# Patient Record
Sex: Female | Born: 1955
Health system: Southern US, Community
[De-identification: ages and names within clinical notes are randomized; demographics above are authoritative.]

## PROBLEM LIST (undated history)

## (undated) ENCOUNTER — Emergency Department (HOSPITAL_COMMUNITY): Discharge status: Absent without leave | Payer: No Typology Code available for payment source

## (undated) DIAGNOSIS — F329 Major depressive disorder, single episode, unspecified: Secondary | ICD-10-CM

## (undated) DIAGNOSIS — R569 Unspecified convulsions: Secondary | ICD-10-CM

## (undated) DIAGNOSIS — I8500 Esophageal varices without bleeding: Secondary | ICD-10-CM

## (undated) DIAGNOSIS — I471 Supraventricular tachycardia: Secondary | ICD-10-CM

## (undated) DIAGNOSIS — J302 Other seasonal allergic rhinitis: Secondary | ICD-10-CM

## (undated) DIAGNOSIS — I2510 Atherosclerotic heart disease of native coronary artery without angina pectoris: Secondary | ICD-10-CM

## (undated) DIAGNOSIS — K766 Portal hypertension: Secondary | ICD-10-CM

## (undated) DIAGNOSIS — E1165 Type 2 diabetes mellitus with hyperglycemia: Secondary | ICD-10-CM

## (undated) DIAGNOSIS — M2041 Other hammer toe(s) (acquired), right foot: Secondary | ICD-10-CM

## (undated) DIAGNOSIS — K219 Gastro-esophageal reflux disease without esophagitis: Secondary | ICD-10-CM

## (undated) DIAGNOSIS — E119 Type 2 diabetes mellitus without complications: Secondary | ICD-10-CM

## (undated) DIAGNOSIS — Z860100 Personal history of colon polyps, unspecified: Secondary | ICD-10-CM

## (undated) DIAGNOSIS — E785 Hyperlipidemia, unspecified: Secondary | ICD-10-CM

## (undated) DIAGNOSIS — R55 Syncope and collapse: Secondary | ICD-10-CM

## (undated) DIAGNOSIS — F419 Anxiety disorder, unspecified: Secondary | ICD-10-CM

## (undated) DIAGNOSIS — T7840XA Allergy, unspecified, initial encounter: Secondary | ICD-10-CM

## (undated) DIAGNOSIS — R000 Tachycardia, unspecified: Secondary | ICD-10-CM

## (undated) DIAGNOSIS — Z8619 Personal history of other infectious and parasitic diseases: Secondary | ICD-10-CM

## (undated) DIAGNOSIS — I10 Essential (primary) hypertension: Secondary | ICD-10-CM

## (undated) DIAGNOSIS — R52 Pain, unspecified: Secondary | ICD-10-CM

## (undated) DIAGNOSIS — F32A Depression, unspecified: Secondary | ICD-10-CM

## (undated) DIAGNOSIS — E1140 Type 2 diabetes mellitus with diabetic neuropathy, unspecified: Secondary | ICD-10-CM

## (undated) DIAGNOSIS — IMO0002 Reserved for concepts with insufficient information to code with codable children: Secondary | ICD-10-CM

## (undated) DIAGNOSIS — I219 Acute myocardial infarction, unspecified: Secondary | ICD-10-CM

## (undated) DIAGNOSIS — K7460 Unspecified cirrhosis of liver: Secondary | ICD-10-CM

## (undated) DIAGNOSIS — M1990 Unspecified osteoarthritis, unspecified site: Secondary | ICD-10-CM

## (undated) DIAGNOSIS — Z8742 Personal history of other diseases of the female genital tract: Secondary | ICD-10-CM

## (undated) HISTORY — DX: Esophageal varices without bleeding: I85.00

## (undated) HISTORY — DX: Portal hypertension: K76.6

## (undated) HISTORY — DX: Essential (primary) hypertension: I10

## (undated) HISTORY — DX: Type 2 diabetes mellitus with diabetic neuropathy, unspecified: E11.40

## (undated) HISTORY — DX: Gastro-esophageal reflux disease without esophagitis: K21.9

## (undated) HISTORY — DX: Allergy, unspecified, initial encounter: T78.40XA

## (undated) HISTORY — DX: Anxiety disorder, unspecified: F41.9

## (undated) HISTORY — DX: Acute myocardial infarction, unspecified: I21.9

## (undated) HISTORY — DX: Other seasonal allergic rhinitis: J30.2

## (undated) HISTORY — DX: Pain, unspecified: R52

## (undated) HISTORY — DX: Atherosclerotic heart disease of native coronary artery without angina pectoris: I25.10

## (undated) HISTORY — DX: Unspecified convulsions: R56.9

## (undated) HISTORY — DX: Syncope and collapse: R55

## (undated) HISTORY — DX: Personal history of other infectious and parasitic diseases: Z86.19

## (undated) HISTORY — DX: Type 2 diabetes mellitus with diabetic neuropathy, unspecified: E11.65

## (undated) HISTORY — DX: Unspecified osteoarthritis, unspecified site: M19.90

## (undated) HISTORY — DX: Personal history of colon polyps, unspecified: Z86.0100

## (undated) HISTORY — DX: Type 2 diabetes mellitus without complications: E11.9

## (undated) HISTORY — DX: Tachycardia, unspecified: R00.0

## (undated) HISTORY — DX: Other hammer toe(s) (acquired), right foot: M20.41

## (undated) HISTORY — DX: Personal history of other diseases of the female genital tract: Z87.42

## (undated) HISTORY — DX: Reserved for concepts with insufficient information to code with codable children: IMO0002

## (undated) HISTORY — DX: Unspecified cirrhosis of liver: K74.60

## (undated) HISTORY — DX: Depression, unspecified: F32.A

## (undated) HISTORY — DX: Hyperlipidemia, unspecified: E78.5

## (undated) HISTORY — DX: Personal history of colonic polyps: Z86.010

## (undated) HISTORY — DX: Major depressive disorder, single episode, unspecified: F32.9

---
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
## 1982-05-16 HISTORY — PX: ECTOPIC PREGNANCY SURGERY: SHX613

## 1982-05-16 HISTORY — PX: DILATION AND CURETTAGE OF UTERUS: SHX78

## 1996-05-16 HISTORY — PX: OVARIAN CYST SURGERY: SHX726

## 1998-09-18 ENCOUNTER — Other Ambulatory Visit: Admission: RE | Admit: 1998-09-18 | Discharge: 1998-09-18 | Payer: Self-pay | Admitting: *Deleted

## 1998-12-11 ENCOUNTER — Encounter: Admission: RE | Admit: 1998-12-11 | Discharge: 1998-12-11 | Payer: Self-pay | Admitting: Internal Medicine

## 1999-04-13 ENCOUNTER — Encounter: Admission: RE | Admit: 1999-04-13 | Discharge: 1999-04-13 | Payer: Self-pay | Admitting: Internal Medicine

## 1999-04-16 ENCOUNTER — Encounter: Admission: RE | Admit: 1999-04-16 | Discharge: 1999-04-16 | Payer: Self-pay | Admitting: Internal Medicine

## 1999-05-20 ENCOUNTER — Other Ambulatory Visit: Admission: RE | Admit: 1999-05-20 | Discharge: 1999-05-20 | Payer: Self-pay | Admitting: *Deleted

## 1999-06-12 ENCOUNTER — Emergency Department (HOSPITAL_COMMUNITY): Admission: EM | Admit: 1999-06-12 | Discharge: 1999-06-12 | Payer: Self-pay | Admitting: Emergency Medicine

## 1999-07-24 ENCOUNTER — Emergency Department (HOSPITAL_COMMUNITY): Admission: EM | Admit: 1999-07-24 | Discharge: 1999-07-24 | Payer: Self-pay | Admitting: Emergency Medicine

## 1999-11-13 ENCOUNTER — Emergency Department (HOSPITAL_COMMUNITY): Admission: EM | Admit: 1999-11-13 | Discharge: 1999-11-13 | Payer: Self-pay | Admitting: Emergency Medicine

## 2000-05-28 ENCOUNTER — Emergency Department (HOSPITAL_COMMUNITY): Admission: EM | Admit: 2000-05-28 | Discharge: 2000-05-28 | Payer: Self-pay | Admitting: Emergency Medicine

## 2000-07-29 ENCOUNTER — Emergency Department (HOSPITAL_COMMUNITY): Admission: EM | Admit: 2000-07-29 | Discharge: 2000-07-30 | Payer: Self-pay

## 2000-10-07 ENCOUNTER — Emergency Department (HOSPITAL_COMMUNITY): Admission: EM | Admit: 2000-10-07 | Discharge: 2000-10-07 | Payer: Self-pay | Admitting: *Deleted

## 2001-02-11 ENCOUNTER — Emergency Department (HOSPITAL_COMMUNITY): Admission: EM | Admit: 2001-02-11 | Discharge: 2001-02-11 | Payer: Self-pay | Admitting: Emergency Medicine

## 2001-02-11 ENCOUNTER — Encounter: Payer: Self-pay | Admitting: Emergency Medicine

## 2001-05-16 ENCOUNTER — Inpatient Hospital Stay (HOSPITAL_COMMUNITY): Admission: EM | Admit: 2001-05-16 | Discharge: 2001-05-18 | Payer: Self-pay | Admitting: Emergency Medicine

## 2001-08-13 ENCOUNTER — Emergency Department (HOSPITAL_COMMUNITY): Admission: EM | Admit: 2001-08-13 | Discharge: 2001-08-13 | Payer: Self-pay | Admitting: *Deleted

## 2001-08-13 ENCOUNTER — Encounter: Payer: Self-pay | Admitting: Emergency Medicine

## 2001-11-14 ENCOUNTER — Emergency Department (HOSPITAL_COMMUNITY): Admission: EM | Admit: 2001-11-14 | Discharge: 2001-11-14 | Payer: Self-pay | Admitting: Emergency Medicine

## 2002-02-22 ENCOUNTER — Emergency Department (HOSPITAL_COMMUNITY): Admission: EM | Admit: 2002-02-22 | Discharge: 2002-02-22 | Payer: Self-pay | Admitting: Emergency Medicine

## 2002-06-25 ENCOUNTER — Emergency Department (HOSPITAL_COMMUNITY): Admission: EM | Admit: 2002-06-25 | Discharge: 2002-06-25 | Payer: Self-pay | Admitting: Emergency Medicine

## 2002-07-19 ENCOUNTER — Emergency Department (HOSPITAL_COMMUNITY): Admission: EM | Admit: 2002-07-19 | Discharge: 2002-07-20 | Payer: Self-pay | Admitting: Emergency Medicine

## 2003-05-19 ENCOUNTER — Inpatient Hospital Stay (HOSPITAL_COMMUNITY): Admission: EM | Admit: 2003-05-19 | Discharge: 2003-05-21 | Payer: Self-pay | Admitting: Family Medicine

## 2003-07-05 ENCOUNTER — Emergency Department (HOSPITAL_COMMUNITY): Admission: EM | Admit: 2003-07-05 | Discharge: 2003-07-05 | Payer: Self-pay | Admitting: Emergency Medicine

## 2003-07-06 ENCOUNTER — Emergency Department (HOSPITAL_COMMUNITY): Admission: EM | Admit: 2003-07-06 | Discharge: 2003-07-06 | Payer: Self-pay | Admitting: Emergency Medicine

## 2003-07-07 ENCOUNTER — Ambulatory Visit (HOSPITAL_COMMUNITY): Admission: RE | Admit: 2003-07-07 | Discharge: 2003-07-07 | Payer: Self-pay | Admitting: Family Medicine

## 2003-07-30 ENCOUNTER — Other Ambulatory Visit: Admission: RE | Admit: 2003-07-30 | Discharge: 2003-07-30 | Payer: Self-pay | Admitting: *Deleted

## 2004-05-16 HISTORY — PX: ROTATOR CUFF REPAIR: SHX139

## 2004-11-24 ENCOUNTER — Emergency Department (HOSPITAL_COMMUNITY): Admission: EM | Admit: 2004-11-24 | Discharge: 2004-11-25 | Payer: Self-pay | Admitting: Emergency Medicine

## 2004-11-27 ENCOUNTER — Emergency Department (HOSPITAL_COMMUNITY): Admission: EM | Admit: 2004-11-27 | Discharge: 2004-11-27 | Payer: Self-pay | Admitting: Emergency Medicine

## 2005-02-01 ENCOUNTER — Other Ambulatory Visit: Admission: RE | Admit: 2005-02-01 | Discharge: 2005-02-01 | Payer: Self-pay | Admitting: *Deleted

## 2005-03-07 ENCOUNTER — Encounter (INDEPENDENT_AMBULATORY_CARE_PROVIDER_SITE_OTHER): Payer: Self-pay | Admitting: *Deleted

## 2005-03-07 ENCOUNTER — Ambulatory Visit (HOSPITAL_COMMUNITY): Admission: RE | Admit: 2005-03-07 | Discharge: 2005-03-07 | Payer: Self-pay | Admitting: *Deleted

## 2006-01-22 ENCOUNTER — Emergency Department (HOSPITAL_COMMUNITY): Admission: EM | Admit: 2006-01-22 | Discharge: 2006-01-22 | Payer: Self-pay | Admitting: Emergency Medicine

## 2006-07-23 ENCOUNTER — Emergency Department (HOSPITAL_COMMUNITY): Admission: EM | Admit: 2006-07-23 | Discharge: 2006-07-23 | Payer: Self-pay | Admitting: Emergency Medicine

## 2007-02-20 ENCOUNTER — Emergency Department (HOSPITAL_COMMUNITY): Admission: EM | Admit: 2007-02-20 | Discharge: 2007-02-20 | Payer: Self-pay | Admitting: Emergency Medicine

## 2007-02-23 ENCOUNTER — Emergency Department (HOSPITAL_COMMUNITY): Admission: EM | Admit: 2007-02-23 | Discharge: 2007-02-24 | Payer: Self-pay | Admitting: Emergency Medicine

## 2007-04-18 ENCOUNTER — Emergency Department (HOSPITAL_COMMUNITY): Admission: EM | Admit: 2007-04-18 | Discharge: 2007-04-18 | Payer: Self-pay | Admitting: Emergency Medicine

## 2007-09-08 ENCOUNTER — Emergency Department (HOSPITAL_COMMUNITY): Admission: EM | Admit: 2007-09-08 | Discharge: 2007-09-08 | Payer: Self-pay | Admitting: Emergency Medicine

## 2008-01-16 ENCOUNTER — Emergency Department (HOSPITAL_COMMUNITY): Admission: EM | Admit: 2008-01-16 | Discharge: 2008-01-16 | Payer: Self-pay | Admitting: Emergency Medicine

## 2008-03-19 ENCOUNTER — Emergency Department (HOSPITAL_COMMUNITY): Admission: EM | Admit: 2008-03-19 | Discharge: 2008-03-19 | Payer: Self-pay | Admitting: Emergency Medicine

## 2008-10-01 ENCOUNTER — Emergency Department (HOSPITAL_COMMUNITY): Admission: EM | Admit: 2008-10-01 | Discharge: 2008-10-01 | Payer: Self-pay | Admitting: Emergency Medicine

## 2008-10-30 ENCOUNTER — Emergency Department (HOSPITAL_COMMUNITY): Admission: EM | Admit: 2008-10-30 | Discharge: 2008-10-30 | Payer: Self-pay | Admitting: Emergency Medicine

## 2008-12-16 ENCOUNTER — Emergency Department (HOSPITAL_COMMUNITY): Admission: EM | Admit: 2008-12-16 | Discharge: 2008-12-16 | Payer: Self-pay | Admitting: Emergency Medicine

## 2008-12-18 ENCOUNTER — Emergency Department (HOSPITAL_COMMUNITY): Admission: EM | Admit: 2008-12-18 | Discharge: 2008-12-18 | Payer: Self-pay | Admitting: Emergency Medicine

## 2008-12-19 ENCOUNTER — Emergency Department (HOSPITAL_COMMUNITY): Admission: EM | Admit: 2008-12-19 | Discharge: 2008-12-20 | Payer: Self-pay | Admitting: Emergency Medicine

## 2009-01-17 ENCOUNTER — Emergency Department (HOSPITAL_COMMUNITY): Admission: EM | Admit: 2009-01-17 | Discharge: 2009-01-17 | Payer: Self-pay | Admitting: Emergency Medicine

## 2009-03-31 ENCOUNTER — Emergency Department (HOSPITAL_COMMUNITY): Admission: EM | Admit: 2009-03-31 | Discharge: 2009-03-31 | Payer: Self-pay | Admitting: Emergency Medicine

## 2009-05-13 ENCOUNTER — Observation Stay (HOSPITAL_COMMUNITY): Admission: EM | Admit: 2009-05-13 | Discharge: 2009-05-13 | Payer: Self-pay | Admitting: Emergency Medicine

## 2009-10-30 ENCOUNTER — Observation Stay (HOSPITAL_COMMUNITY): Admission: EM | Admit: 2009-10-30 | Discharge: 2009-10-31 | Payer: Self-pay | Admitting: Emergency Medicine

## 2009-10-30 ENCOUNTER — Ambulatory Visit: Payer: Self-pay | Admitting: Cardiology

## 2009-10-31 ENCOUNTER — Encounter (INDEPENDENT_AMBULATORY_CARE_PROVIDER_SITE_OTHER): Payer: Self-pay | Admitting: Family Medicine

## 2010-01-14 ENCOUNTER — Emergency Department (HOSPITAL_COMMUNITY): Admission: EM | Admit: 2010-01-14 | Discharge: 2010-01-14 | Payer: Self-pay | Admitting: Emergency Medicine

## 2010-01-18 ENCOUNTER — Emergency Department (HOSPITAL_COMMUNITY): Admission: EM | Admit: 2010-01-18 | Discharge: 2010-01-18 | Payer: Self-pay | Admitting: Emergency Medicine

## 2010-02-24 ENCOUNTER — Emergency Department (HOSPITAL_COMMUNITY): Admission: EM | Admit: 2010-02-24 | Discharge: 2010-02-25 | Payer: Self-pay | Admitting: Emergency Medicine

## 2010-07-18 ENCOUNTER — Emergency Department (HOSPITAL_COMMUNITY)
Admission: EM | Admit: 2010-07-18 | Discharge: 2010-07-19 | Disposition: A | Discharge status: Home / Self Care | Payer: Self-pay | Attending: Emergency Medicine | Admitting: Emergency Medicine

## 2010-07-18 DIAGNOSIS — Z760 Encounter for issue of repeat prescription: Secondary | ICD-10-CM | POA: Insufficient documentation

## 2010-07-18 DIAGNOSIS — F3289 Other specified depressive episodes: Secondary | ICD-10-CM | POA: Insufficient documentation

## 2010-07-18 DIAGNOSIS — F329 Major depressive disorder, single episode, unspecified: Secondary | ICD-10-CM | POA: Insufficient documentation

## 2010-07-18 DIAGNOSIS — Z794 Long term (current) use of insulin: Secondary | ICD-10-CM | POA: Insufficient documentation

## 2010-07-18 DIAGNOSIS — E785 Hyperlipidemia, unspecified: Secondary | ICD-10-CM | POA: Insufficient documentation

## 2010-07-18 DIAGNOSIS — I10 Essential (primary) hypertension: Secondary | ICD-10-CM | POA: Insufficient documentation

## 2010-07-18 DIAGNOSIS — Z79899 Other long term (current) drug therapy: Secondary | ICD-10-CM | POA: Insufficient documentation

## 2010-07-18 DIAGNOSIS — R42 Dizziness and giddiness: Secondary | ICD-10-CM | POA: Insufficient documentation

## 2010-07-18 DIAGNOSIS — E119 Type 2 diabetes mellitus without complications: Secondary | ICD-10-CM | POA: Insufficient documentation

## 2010-07-18 LAB — POCT I-STAT, CHEM 8
BUN: 18 mg/dL (ref 6–23)
Chloride: 106 mEq/L (ref 96–112)
Glucose, Bld: 272 mg/dL — ABNORMAL HIGH (ref 70–99)
HCT: 41 % (ref 36.0–46.0)
Potassium: 4.2 mEq/L (ref 3.5–5.1)
Sodium: 134 mEq/L — ABNORMAL LOW (ref 135–145)
TCO2: 23 mmol/L (ref 0–100)

## 2010-07-18 LAB — GLUCOSE, CAPILLARY: Glucose-Capillary: 246 mg/dL — ABNORMAL HIGH (ref 70–99)

## 2010-07-19 LAB — CBC
HCT: 39.4 % (ref 36.0–46.0)
MCH: 27.5 pg (ref 26.0–34.0)
MCHC: 34 g/dL (ref 30.0–36.0)
MCV: 80.7 fL (ref 78.0–100.0)
RBC: 4.88 MIL/uL (ref 3.87–5.11)

## 2010-07-19 LAB — URINALYSIS, ROUTINE W REFLEX MICROSCOPIC
Bilirubin Urine: NEGATIVE
Glucose, UA: >1000 mg/dL — AB
Hgb urine dipstick: NEGATIVE
Ketones, ur: NEGATIVE mg/dL
pH: 5.5 (ref 5.0–8.0)

## 2010-07-19 LAB — DIFFERENTIAL
Eosinophils Relative: 3 % (ref 0–5)
Monocytes Absolute: 0.5 10*3/uL (ref 0.1–1.0)
Neutro Abs: 2.9 10*3/uL (ref 1.7–7.7)

## 2010-07-19 LAB — GLUCOSE, CAPILLARY: Glucose-Capillary: 219 mg/dL — ABNORMAL HIGH (ref 70–99)

## 2010-07-29 LAB — CBC
MCH: 27.7 pg (ref 26.0–34.0)
MCHC: 34.2 g/dL (ref 30.0–36.0)
MCV: 80.8 fL (ref 78.0–100.0)

## 2010-07-29 LAB — DIFFERENTIAL
Basophils Absolute: 0 10*3/uL (ref 0.0–0.1)
Basophils Relative: 0 % (ref 0–1)
Eosinophils Relative: 2 % (ref 0–5)
Monocytes Absolute: 0.3 10*3/uL (ref 0.1–1.0)
Monocytes Relative: 6 % (ref 3–12)
Neutrophils Relative %: 41 % — ABNORMAL LOW (ref 43–77)

## 2010-07-29 LAB — BASIC METABOLIC PANEL
BUN: 14 mg/dL (ref 6–23)
CO2: 22 mEq/L (ref 19–32)
Chloride: 103 mEq/L (ref 96–112)
Creatinine, Ser: 0.85 mg/dL (ref 0.4–1.2)
GFR calc non Af Amer: >60 mL/min (ref >60)
Glucose, Bld: 363 mg/dL — ABNORMAL HIGH (ref 70–99)
Potassium: 3.4 mEq/L — ABNORMAL LOW (ref 3.5–5.1)
Sodium: 133 mEq/L — ABNORMAL LOW (ref 135–145)

## 2010-07-29 LAB — URINALYSIS, ROUTINE W REFLEX MICROSCOPIC
Bilirubin Urine: NEGATIVE
Glucose, UA: >1000 mg/dL — AB
Hgb urine dipstick: NEGATIVE
Leukocytes, UA: NEGATIVE
Nitrite: NEGATIVE
pH: 5.5 (ref 5.0–8.0)

## 2010-07-29 LAB — GLUCOSE, CAPILLARY: Glucose-Capillary: 294 mg/dL — ABNORMAL HIGH (ref 70–99)

## 2010-07-29 LAB — POCT CARDIAC MARKERS: Troponin i, poc: <0.05 ng/mL (ref 0.00–0.09)

## 2010-07-29 LAB — URINE MICROSCOPIC-ADD ON

## 2010-08-02 LAB — BASIC METABOLIC PANEL
BUN: 13 mg/dL (ref 6–23)
Chloride: 100 mEq/L (ref 96–112)
Chloride: 104 mEq/L (ref 96–112)
Creatinine, Ser: 0.37 mg/dL — ABNORMAL LOW (ref 0.4–1.2)
Creatinine, Ser: 0.4 mg/dL (ref 0.4–1.2)
GFR calc Af Amer: >60 mL/min (ref >60)
GFR calc Af Amer: >60 mL/min (ref >60)
Glucose, Bld: 369 mg/dL — ABNORMAL HIGH (ref 70–99)
Potassium: 3.6 mEq/L (ref 3.5–5.1)
Sodium: 131 mEq/L — ABNORMAL LOW (ref 135–145)
Sodium: 137 mEq/L (ref 135–145)

## 2010-08-02 LAB — GLUCOSE, CAPILLARY
Glucose-Capillary: 171 mg/dL — ABNORMAL HIGH (ref 70–99)
Glucose-Capillary: 173 mg/dL — ABNORMAL HIGH (ref 70–99)
Glucose-Capillary: 396 mg/dL — ABNORMAL HIGH (ref 70–99)

## 2010-08-02 LAB — TSH: TSH: 1.038 u[IU]/mL (ref 0.350–4.500)

## 2010-08-02 LAB — DIFFERENTIAL
Basophils Absolute: 0.1 10*3/uL (ref 0.0–0.1)
Lymphocytes Relative: 40 % (ref 12–46)
Neutro Abs: 3.5 10*3/uL (ref 1.7–7.7)

## 2010-08-02 LAB — POCT CARDIAC MARKERS
CKMB, poc: 3.5 ng/mL (ref 1.0–8.0)
Myoglobin, poc: 61.7 ng/mL (ref 12–200)
Troponin i, poc: <0.05 ng/mL (ref 0.00–0.09)

## 2010-08-02 LAB — CK TOTAL AND CKMB (NOT AT ARMC)
CK, MB: 4 ng/mL (ref 0.3–4.0)
CK, MB: 4.8 ng/mL — ABNORMAL HIGH (ref 0.3–4.0)
CK, MB: 6.1 ng/mL (ref 0.3–4.0)
Relative Index: 5.1 — ABNORMAL HIGH (ref 0.0–2.5)
Relative Index: INVALID (ref 0.0–2.5)
Relative Index: INVALID (ref 0.0–2.5)
Total CK: 120 U/L (ref 7–177)
Total CK: 68 U/L (ref 7–177)
Total CK: 78 U/L (ref 7–177)

## 2010-08-02 LAB — BASIC METABOLIC PANEL WITH GFR
CO2: 21 meq/L (ref 19–32)
Calcium: 10 mg/dL (ref 8.4–10.5)
GFR calc non Af Amer: >60 mL/min (ref >60)
Potassium: 3.9 meq/L (ref 3.5–5.1)

## 2010-08-02 LAB — TROPONIN I
Troponin I: 0.06 ng/mL (ref 0.00–0.06)
Troponin I: 0.07 ng/mL — ABNORMAL HIGH (ref 0.00–0.06)
Troponin I: 0.12 ng/mL — ABNORMAL HIGH (ref 0.00–0.06)

## 2010-08-02 LAB — MAGNESIUM: Magnesium: 1.9 mg/dL (ref 1.5–2.5)

## 2010-08-02 LAB — LIPID PANEL: LDL Cholesterol: UNDETERMINED mg/dL (ref 0–99)

## 2010-08-02 LAB — CBC
Hemoglobin: 15.9 g/dL — ABNORMAL HIGH (ref 12.0–15.0)
Platelets: 188 10*3/uL (ref 150–400)
RDW: 14.1 % (ref 11.5–15.5)
WBC: 7.1 10*3/uL (ref 4.0–10.5)

## 2010-08-12 ENCOUNTER — Emergency Department (HOSPITAL_COMMUNITY)
Admission: EM | Admit: 2010-08-12 | Discharge: 2010-08-12 | Disposition: A | Discharge status: Home / Self Care | Payer: Self-pay | Attending: Emergency Medicine | Admitting: Emergency Medicine

## 2010-08-12 ENCOUNTER — Emergency Department (HOSPITAL_COMMUNITY): Payer: Self-pay

## 2010-08-12 DIAGNOSIS — R5381 Other malaise: Secondary | ICD-10-CM | POA: Insufficient documentation

## 2010-08-12 DIAGNOSIS — R112 Nausea with vomiting, unspecified: Secondary | ICD-10-CM | POA: Insufficient documentation

## 2010-08-12 DIAGNOSIS — R05 Cough: Secondary | ICD-10-CM | POA: Insufficient documentation

## 2010-08-12 DIAGNOSIS — R6883 Chills (without fever): Secondary | ICD-10-CM | POA: Insufficient documentation

## 2010-08-12 DIAGNOSIS — J111 Influenza due to unidentified influenza virus with other respiratory manifestations: Secondary | ICD-10-CM | POA: Insufficient documentation

## 2010-08-12 DIAGNOSIS — R630 Anorexia: Secondary | ICD-10-CM | POA: Insufficient documentation

## 2010-08-12 DIAGNOSIS — F3289 Other specified depressive episodes: Secondary | ICD-10-CM | POA: Insufficient documentation

## 2010-08-12 DIAGNOSIS — Z794 Long term (current) use of insulin: Secondary | ICD-10-CM | POA: Insufficient documentation

## 2010-08-12 DIAGNOSIS — Z79899 Other long term (current) drug therapy: Secondary | ICD-10-CM | POA: Insufficient documentation

## 2010-08-12 DIAGNOSIS — E785 Hyperlipidemia, unspecified: Secondary | ICD-10-CM | POA: Insufficient documentation

## 2010-08-12 DIAGNOSIS — F329 Major depressive disorder, single episode, unspecified: Secondary | ICD-10-CM | POA: Insufficient documentation

## 2010-08-12 DIAGNOSIS — R059 Cough, unspecified: Secondary | ICD-10-CM | POA: Insufficient documentation

## 2010-08-12 DIAGNOSIS — I10 Essential (primary) hypertension: Secondary | ICD-10-CM | POA: Insufficient documentation

## 2010-08-12 DIAGNOSIS — IMO0001 Reserved for inherently not codable concepts without codable children: Secondary | ICD-10-CM | POA: Insufficient documentation

## 2010-08-12 DIAGNOSIS — E119 Type 2 diabetes mellitus without complications: Secondary | ICD-10-CM | POA: Insufficient documentation

## 2010-08-16 ENCOUNTER — Emergency Department (HOSPITAL_COMMUNITY)
Admission: EM | Admit: 2010-08-16 | Discharge: 2010-08-16 | Disposition: A | Discharge status: Home / Self Care | Payer: Self-pay | Attending: Emergency Medicine | Admitting: Emergency Medicine

## 2010-08-16 DIAGNOSIS — E785 Hyperlipidemia, unspecified: Secondary | ICD-10-CM | POA: Insufficient documentation

## 2010-08-16 DIAGNOSIS — Z79899 Other long term (current) drug therapy: Secondary | ICD-10-CM | POA: Insufficient documentation

## 2010-08-16 DIAGNOSIS — E119 Type 2 diabetes mellitus without complications: Secondary | ICD-10-CM | POA: Insufficient documentation

## 2010-08-16 DIAGNOSIS — F3289 Other specified depressive episodes: Secondary | ICD-10-CM | POA: Insufficient documentation

## 2010-08-16 DIAGNOSIS — Z794 Long term (current) use of insulin: Secondary | ICD-10-CM | POA: Insufficient documentation

## 2010-08-16 DIAGNOSIS — I10 Essential (primary) hypertension: Secondary | ICD-10-CM | POA: Insufficient documentation

## 2010-08-16 DIAGNOSIS — R059 Cough, unspecified: Secondary | ICD-10-CM | POA: Insufficient documentation

## 2010-08-16 DIAGNOSIS — R05 Cough: Secondary | ICD-10-CM | POA: Insufficient documentation

## 2010-08-16 DIAGNOSIS — F329 Major depressive disorder, single episode, unspecified: Secondary | ICD-10-CM | POA: Insufficient documentation

## 2010-08-16 DIAGNOSIS — R509 Fever, unspecified: Secondary | ICD-10-CM | POA: Insufficient documentation

## 2010-08-16 LAB — POCT I-STAT, CHEM 8
Chloride: 105 mEq/L (ref 96–112)
Glucose, Bld: 411 mg/dL — ABNORMAL HIGH (ref 70–99)
HCT: 44 % (ref 36.0–46.0)
Potassium: 4.5 mEq/L (ref 3.5–5.1)

## 2010-08-16 LAB — CARDIAC PANEL(CRET KIN+CKTOT+MB+TROPI)
CK, MB: 3.4 ng/mL (ref 0.3–4.0)
CK, MB: 3.4 ng/mL (ref 0.3–4.0)
Relative Index: INVALID (ref 0.0–2.5)
Total CK: 54 U/L (ref 7–177)
Troponin I: 0.11 ng/mL — ABNORMAL HIGH (ref 0.00–0.06)
Troponin I: 0.13 ng/mL — ABNORMAL HIGH (ref 0.00–0.06)

## 2010-08-16 LAB — CBC
MCHC: 34.1 g/dL (ref 30.0–36.0)
MCV: 84.2 fL (ref 78.0–100.0)
RBC: 5.1 MIL/uL (ref 3.87–5.11)

## 2010-08-16 LAB — POCT CARDIAC MARKERS
CKMB, poc: 2.1 ng/mL (ref 1.0–8.0)
Myoglobin, poc: 47.2 ng/mL (ref 12–200)
Troponin i, poc: <0.05 ng/mL (ref 0.00–0.09)

## 2010-08-16 LAB — GLUCOSE, CAPILLARY
Glucose-Capillary: 260 mg/dL — ABNORMAL HIGH (ref 70–99)
Glucose-Capillary: 293 mg/dL — ABNORMAL HIGH (ref 70–99)

## 2010-08-16 LAB — PROTIME-INR: INR: 0.96 (ref 0.00–1.49)

## 2010-08-16 LAB — CK TOTAL AND CKMB (NOT AT ARMC): Relative Index: INVALID (ref 0.0–2.5)

## 2010-08-20 LAB — GLUCOSE, CAPILLARY: Glucose-Capillary: 214 mg/dL — ABNORMAL HIGH (ref 70–99)

## 2010-08-21 LAB — GLUCOSE, CAPILLARY: Glucose-Capillary: 300 mg/dL — ABNORMAL HIGH (ref 70–99)

## 2010-08-22 LAB — DIFFERENTIAL
Basophils Relative: 0 % (ref 0–1)
Eosinophils Absolute: 0.1 10*3/uL (ref 0.0–0.7)
Eosinophils Relative: 2 % (ref 0–5)
Lymphs Abs: 1.4 10*3/uL (ref 0.7–4.0)
Neutrophils Relative %: 76 % (ref 43–77)

## 2010-08-22 LAB — CBC
HCT: 43.5 % (ref 36.0–46.0)
MCHC: 34.1 g/dL (ref 30.0–36.0)
MCV: 86.1 fL (ref 78.0–100.0)
Platelets: 201 10*3/uL (ref 150–400)
WBC: 7.8 10*3/uL (ref 4.0–10.5)

## 2010-08-22 LAB — POCT CARDIAC MARKERS
CKMB, poc: 1.2 ng/mL (ref 1.0–8.0)
Myoglobin, poc: 47.2 ng/mL (ref 12–200)
Troponin i, poc: <0.05 ng/mL (ref 0.00–0.09)

## 2010-08-22 LAB — POCT I-STAT, CHEM 8
BUN: 20 mg/dL (ref 6–23)
Calcium, Ion: 1.16 mmol/L (ref 1.12–1.32)
Chloride: 102 meq/L (ref 96–112)
Creatinine, Ser: 0.5 mg/dL (ref 0.4–1.2)
Glucose, Bld: 453 mg/dL — ABNORMAL HIGH (ref 70–99)
HCT: 45 % (ref 36.0–46.0)
Hemoglobin: 15.3 g/dL — ABNORMAL HIGH (ref 12.0–15.0)
Potassium: 4.3 mEq/L (ref 3.5–5.1)
Sodium: 131 mEq/L — ABNORMAL LOW (ref 135–145)
TCO2: 25 mmol/L (ref 0–100)

## 2010-08-23 ENCOUNTER — Emergency Department (HOSPITAL_COMMUNITY)
Admission: EM | Admit: 2010-08-23 | Discharge: 2010-08-23 | Disposition: A | Discharge status: Home / Self Care | Payer: Self-pay | Attending: Emergency Medicine | Admitting: Emergency Medicine

## 2010-08-23 DIAGNOSIS — J069 Acute upper respiratory infection, unspecified: Secondary | ICD-10-CM | POA: Insufficient documentation

## 2010-08-23 DIAGNOSIS — R05 Cough: Secondary | ICD-10-CM | POA: Insufficient documentation

## 2010-08-23 DIAGNOSIS — E119 Type 2 diabetes mellitus without complications: Secondary | ICD-10-CM | POA: Insufficient documentation

## 2010-08-23 DIAGNOSIS — Z794 Long term (current) use of insulin: Secondary | ICD-10-CM | POA: Insufficient documentation

## 2010-08-23 DIAGNOSIS — R059 Cough, unspecified: Secondary | ICD-10-CM | POA: Insufficient documentation

## 2010-08-23 DIAGNOSIS — I10 Essential (primary) hypertension: Secondary | ICD-10-CM | POA: Insufficient documentation

## 2010-08-24 LAB — BASIC METABOLIC PANEL
CO2: 27 mEq/L (ref 19–32)
Chloride: 107 mEq/L (ref 96–112)
GFR calc Af Amer: >60 mL/min (ref >60)
Glucose, Bld: 112 mg/dL — ABNORMAL HIGH (ref 70–99)
Sodium: 139 mEq/L (ref 135–145)

## 2010-08-24 LAB — CBC
HCT: 41.3 % (ref 36.0–46.0)
Hemoglobin: 13.8 g/dL (ref 12.0–15.0)
MCHC: 33.3 g/dL (ref 30.0–36.0)
MCV: 84.9 fL (ref 78.0–100.0)
RBC: 4.87 MIL/uL (ref 3.87–5.11)
RDW: 14.4 % (ref 11.5–15.5)

## 2010-08-24 LAB — URINE MICROSCOPIC-ADD ON

## 2010-08-24 LAB — DIFFERENTIAL
Basophils Relative: 1 % (ref 0–1)
Eosinophils Absolute: 0.2 10*3/uL (ref 0.0–0.7)
Eosinophils Relative: 2 % (ref 0–5)
Monocytes Absolute: 0.4 10*3/uL (ref 0.1–1.0)
Monocytes Relative: 5 % (ref 3–12)

## 2010-08-24 LAB — POCT CARDIAC MARKERS: CKMB, poc: 1.8 ng/mL (ref 1.0–8.0)

## 2010-08-24 LAB — URINALYSIS, ROUTINE W REFLEX MICROSCOPIC
Glucose, UA: 100 mg/dL — AB
Hgb urine dipstick: NEGATIVE
Specific Gravity, Urine: 1.024 (ref 1.005–1.030)
Urobilinogen, UA: 0.2 mg/dL (ref 0.0–1.0)
pH: 7.5 (ref 5.0–8.0)

## 2010-08-27 ENCOUNTER — Emergency Department (HOSPITAL_COMMUNITY)
Admission: EM | Admit: 2010-08-27 | Discharge: 2010-08-28 | Disposition: A | Discharge status: Home / Self Care | Payer: Self-pay | Attending: Emergency Medicine | Admitting: Emergency Medicine

## 2010-08-27 DIAGNOSIS — I10 Essential (primary) hypertension: Secondary | ICD-10-CM | POA: Insufficient documentation

## 2010-08-27 DIAGNOSIS — R509 Fever, unspecified: Secondary | ICD-10-CM | POA: Insufficient documentation

## 2010-08-27 DIAGNOSIS — Z794 Long term (current) use of insulin: Secondary | ICD-10-CM | POA: Insufficient documentation

## 2010-08-27 DIAGNOSIS — R05 Cough: Secondary | ICD-10-CM | POA: Insufficient documentation

## 2010-08-27 DIAGNOSIS — R059 Cough, unspecified: Secondary | ICD-10-CM | POA: Insufficient documentation

## 2010-08-27 DIAGNOSIS — E119 Type 2 diabetes mellitus without complications: Secondary | ICD-10-CM | POA: Insufficient documentation

## 2010-08-28 ENCOUNTER — Emergency Department (HOSPITAL_COMMUNITY): Payer: Self-pay

## 2010-08-30 ENCOUNTER — Encounter: Payer: Self-pay | Admitting: Family Medicine

## 2010-08-30 ENCOUNTER — Ambulatory Visit (INDEPENDENT_AMBULATORY_CARE_PROVIDER_SITE_OTHER): Payer: Self-pay | Admitting: Family Medicine

## 2010-08-30 VITALS — BP 128/70 | HR 72 | Temp 98.3°F | Wt 182.1 lb

## 2010-08-30 DIAGNOSIS — E1140 Type 2 diabetes mellitus with diabetic neuropathy, unspecified: Secondary | ICD-10-CM | POA: Insufficient documentation

## 2010-08-30 DIAGNOSIS — J302 Other seasonal allergic rhinitis: Secondary | ICD-10-CM

## 2010-08-30 DIAGNOSIS — E119 Type 2 diabetes mellitus without complications: Secondary | ICD-10-CM

## 2010-08-30 DIAGNOSIS — I10 Essential (primary) hypertension: Secondary | ICD-10-CM | POA: Insufficient documentation

## 2010-08-30 DIAGNOSIS — F331 Major depressive disorder, recurrent, moderate: Secondary | ICD-10-CM | POA: Insufficient documentation

## 2010-08-30 DIAGNOSIS — K219 Gastro-esophageal reflux disease without esophagitis: Secondary | ICD-10-CM

## 2010-08-30 DIAGNOSIS — E1169 Type 2 diabetes mellitus with other specified complication: Secondary | ICD-10-CM | POA: Insufficient documentation

## 2010-08-30 DIAGNOSIS — E1165 Type 2 diabetes mellitus with hyperglycemia: Secondary | ICD-10-CM | POA: Insufficient documentation

## 2010-08-30 DIAGNOSIS — E785 Hyperlipidemia, unspecified: Secondary | ICD-10-CM | POA: Insufficient documentation

## 2010-08-30 DIAGNOSIS — E118 Type 2 diabetes mellitus with unspecified complications: Secondary | ICD-10-CM | POA: Insufficient documentation

## 2010-08-30 DIAGNOSIS — F329 Major depressive disorder, single episode, unspecified: Secondary | ICD-10-CM

## 2010-08-30 DIAGNOSIS — J40 Bronchitis, not specified as acute or chronic: Secondary | ICD-10-CM

## 2010-08-30 DIAGNOSIS — Z0000 Encounter for general adult medical examination without abnormal findings: Secondary | ICD-10-CM

## 2010-08-30 DIAGNOSIS — J309 Allergic rhinitis, unspecified: Secondary | ICD-10-CM

## 2010-08-30 MED ORDER — METFORMIN HCL 1000 MG PO TABS: 1000.0000 mg | ORAL_TABLET | Freq: Two times a day (BID) | ORAL | Status: DC

## 2010-08-30 MED ORDER — METOPROLOL TARTRATE 12.5 MG HALF TABLET: 12.5000 mg | ORAL_TABLET | Freq: Two times a day (BID) | ORAL | Status: DC

## 2010-08-30 MED ORDER — LISINOPRIL 10 MG PO TABS: 10.0000 mg | ORAL_TABLET | Freq: Every day | ORAL | Status: DC

## 2010-08-30 MED ORDER — INSULIN REGULAR HUMAN 100 UNIT/ML IJ SOLN: 20.0000 [IU] | Freq: Three times a day (TID) | INTRAMUSCULAR | Status: DC

## 2010-08-30 MED ORDER — OMEPRAZOLE 20 MG PO CPDR: 20.0000 mg | DELAYED_RELEASE_CAPSULE | Freq: Every day | ORAL | Status: DC

## 2010-08-30 MED ORDER — CITALOPRAM HYDROBROMIDE 40 MG PO TABS: 40.0000 mg | ORAL_TABLET | Freq: Every day | ORAL | Status: DC

## 2010-08-30 NOTE — Patient Instructions (Signed)
Return at your convenience for complete physical. Return in next few days/week for fasting blood work. PPD placed today. Push fluids and rest. Continue doxycycline until better.  Update [place] if not improving as expected. Continue diabetes meds, we will check blood work and request records from Dr. Tyson Dense.

## 2010-08-30 NOTE — Assessment & Plan Note (Addendum)
Previously receiving samples from MD office.  Discussed we don't have much as far as insulin samples.   Refilled meds to receive at [facility]. Will likely be due for blood work. Await records from Dr. Concepcion Elk

## 2010-08-30 NOTE — Progress Notes (Signed)
  Subjective:    Patient ID: Karen [name], female    DOB: [birth year], 55 y.o.   MRN: [id number]  HPI CC: new patient establish, URI issues  Previously saw Dr. Tyson Dense.  08/12/2010 onset of illness - body aches, chills, Tmax 101.2 lasted 2 days.  + nausea/dry heaves.  No HA, ST, congestion, RN, sneezing, itchy eyes.  No abd pain.  No diarrhea, constipation.    No sick contacts.  No smokers at home.  No h/o asthma/copd.  + h/o allergies.  On zyrtec for this.  1 wk h/o loss of voice.  Seen at ER 4 times - initially thought flu, then thought URTI.  Latest happened Friday night, felt like was running fever, felt like was gonna pass out.  Went to ER, started on doxycyline x 10 days, started on codeine cough syrup and tylenol/ibuprofen.  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  Reviewed latest 5 ER reports.  One for hyperglycemia, other four for URTI sxs (?influenza, bronchitis, URI, bronchitis).  CXR normal x 2.  Review of Systems  Constitutional: Positive for fever. Negative for chills, activity change, appetite change, fatigue and unexpected weight change.  HENT: Negative for hearing loss (R ear chronic hearing loss) and neck pain.   Eyes: Negative for visual disturbance.  Respiratory: Positive for cough. Negative for chest tightness, shortness of breath and wheezing.   Cardiovascular: Negative for chest pain, palpitations and leg swelling.  Gastrointestinal: Negative for nausea, vomiting, abdominal pain, diarrhea, constipation, blood in stool and abdominal distention.  Genitourinary: Negative for hematuria and difficulty urinating.  Musculoskeletal: Negative for myalgias and arthralgias.  Skin: Negative for rash.  Neurological: Negative for dizziness, seizures, syncope and headaches.  Hematological: Does not bruise/bleed easily.  Psychiatric/Behavioral: Negative for dysphoric mood. The patient is not nervous/anxious.        Objective:   Physical Exam  Nursing note  and vitals reviewed. Constitutional: She is oriented to person, place, and time. She appears well-developed and well-nourished.       hoarse  HENT:  Head: Normocephalic and atraumatic.  Right Ear: External ear normal.  Left Ear: External ear normal.  Nose: Nose normal.  Mouth/Throat: Oropharynx is clear and moist.  Eyes: Conjunctivae and EOM are normal. Pupils are equal, round, and reactive to light. No scleral icterus.  Neck: Normal range of motion. Neck supple. No thyromegaly present.  Cardiovascular: Normal rate, regular rhythm, normal heart sounds and intact distal pulses.   No murmur heard. Pulses:      Radial pulses are 2+ on the right side, and 2+ on the left side.  Pulmonary/Chest: Effort normal and breath sounds normal. No respiratory distress. She has no wheezes. She has no rales.  Abdominal: Soft. Bowel sounds are normal. She exhibits no distension and no mass. There is no tenderness. There is no rebound and no guarding.  Musculoskeletal: Normal range of motion.  Lymphadenopathy:    She has no cervical adenopathy.  Neurological: She is alert and oriented to person, place, and time.       CN grossly intact, station and gait intact  Skin: Skin is warm and dry. No rash noted.  Psychiatric: She has a normal mood and affect. Her behavior is normal. Judgment and thought content normal.          Assessment & Plan:

## 2010-08-31 ENCOUNTER — Encounter: Payer: Self-pay | Admitting: Family Medicine

## 2010-08-31 NOTE — Assessment & Plan Note (Signed)
Continue lipitor  ?

## 2010-08-31 NOTE — Assessment & Plan Note (Signed)
Continue med provided from ER.  Update if any worsening.

## 2010-08-31 NOTE — Assessment & Plan Note (Signed)
Stable on current meds.  Continue.  Await records from Dr. Tyson Dense

## 2010-08-31 NOTE — Assessment & Plan Note (Signed)
Continue celexa

## 2010-09-01 ENCOUNTER — Encounter: Payer: Self-pay | Admitting: *Deleted

## 2010-09-01 LAB — TB SKIN TEST: TB Skin Test: NEGATIVE mm

## 2010-09-01 MED ORDER — TUBERCULIN PPD 5 UNIT/0.1ML ID SOLN: 5.0000 [IU] | Freq: Once | INTRADERMAL | Status: DC

## 2010-09-01 NOTE — Progress Notes (Signed)
Addended by: Josph Macho on: 09/01/2010 12:07 PM   Modules accepted: Orders

## 2010-10-01 NOTE — H&P (Signed)
NAME:  Karen [name], HOFLAND                       ACCOUNT NO.:  [id number]   MEDICAL RECORD NO.:  [id number]                   PATIENT TYPE:  INP   LOCATION:  0455                                 FACILITY:  [facility]   PHYSICIAN:  Hollice Espy, M.D.            DATE OF BIRTH:  Feb 09, 1956   DATE OF ADMISSION:  05/19/2003  DATE OF DISCHARGE:                                HISTORY & PHYSICAL   PRIMARY CARE PHYSICIAN:  Dr. Lavada Mesi.   ATTENDING PHYSICIAN:  Dr. Virginia Rochester.   CHIEF COMPLAINT:  Nausea and vomiting.   This is a 55 year old white female with a past medical history of  hypertension, hyperlipidemia, and diabetes type 2, who has had recurrent  episodes of gastroenteritis, who presents with similar symptoms again.  Patient stated that she was previously well, and this morning she started  having nausea and vomiting.  She was experienced to the previous symptoms  and knew to go into the doctor's office.  She was seen in her PCP's office,  Dr. Prince [name].  She was found to have some acute dehydration.  Her BGM was found  to be 140.  Dr. Prince [name] gave me a call, and the patient was agreed to make a  direct admission to [facility].  She was admitted to the floor.  Currently, she states that she is feeling a little bit better, although  still a little nauseated.   Labs are all pending, although her physical exam is remarkable for having  active bowel sounds.  Patient otherwise denied any other complaints.  She  denied any headaches, visual changes, dysphagia, chest pain, shortness of  breath, palpitations.  She denies any extremity pain or weakness, although  overall, she does feel fatigued.  She denies any hematuria, dysuria,  constipation, or diarrhea.  Patient does note that diarrhea usually follows  these episodes of gastroenteritis.   PAST MEDICAL HISTORY:  1. Diabetes type 2.  2. Hypertension.  3. Hyperlipidemia.  4. GERD.  5. Medical noncompliance, although  in the last six months, the patient has     had marked turn-around and is much more compliant with her medications.   ALLERGIES:  1. PENICILLIN.  2. ERYTHROMYCIN, although her erythromycin allergy is noted to cause her     severe stomach cramps.   MEDICATIONS:  1. Avandia 4 mg daily.  2. Prevacid 20 mg daily.  3. Captopril/hydrochlorothiazide 50/25 mg p.o. daily.  4. Lantus 100 units subcu q.h.s.  5. Lovastatin 20 mg p.o. q.h.s.  6. Tricor 160 mg p.o. q.h.s.   She is to receive none of these medications today.   SOCIAL HISTORY:  Patient denies any drug use, tobacco use, or alcohol use.   FAMILY HISTORY:  Noncontributory.   PHYSICAL EXAMINATION:  GENERAL:  Patient is alert and oriented x 3.  Appears  in no apparent distress.  She is pleasant.  HEENT:  Normocephalic and atraumatic.  She  has no carotid bruits.  Mucous  membranes are dry.  HEART:  Regular rate and rhythm.  S1 and S2.  LUNGS:  Clear to auscultation.  ABDOMEN:  Soft, nontender, nondistended.  She has absent bowel sounds.  EXTREMITIES:  She has no clubbing or cyanosis.  She has trace edema.  She  has 1+ peripheral pulses.  She has no focal neurological deficits.   LAB WORK:  CBC, abdominal x-rays, CMP, pancreatic enzymes are all pending.   ASSESSMENT/PLAN:  1. Nausea and vomiting:  This may be gastroenteritis versus ileus versus     diabetic gastroparesis.  We will check labs, abdominal x-rays, make     n.p.o., and give IV fluids.  Tomorrow will advance to clears in the     morning.  Will add IV Reglan to her regimen.  Cover with Phenergan and     morphine for nausea and pain respectively.  2. Diabetes mellitus type 2:  The patient is n.p.o. and will go with     diabetic sliding scale q.6h. and restart Lantus and Avandia when the     patient is p.o.  3. Hypertension:  Will hold her p.o. antihypertensive medications until the     patient is eating.  4. Hyperlipidemia:  Holding Tricor and Lovastatin.  5. History  of gastroesophageal reflux disease:  Will hold Protonix at this     time.  If the patient is n.p.o. for longer than one day, we will go ahead     and start IV Protonix.                                               Hollice Espy, M.D.    SKK/MEDQ  D:  05/19/2003  T:  05/19/2003  Job:  [id number]

## 2010-10-01 NOTE — Discharge Summary (Signed)
NAME:  Karen [name], Karen [name]                       ACCOUNT NO.:  [id number]   MEDICAL RECORD NO.:  [id number]                   PATIENT TYPE:  INP   LOCATION:  0455                                 FACILITY:  [facility]   PHYSICIAN:  Jackie Plum, M.D.             DATE OF BIRTH:  December 10, 1955   DATE OF ADMISSION:  05/19/2003  DATE OF DISCHARGE:  05/21/2003                                 DISCHARGE SUMMARY   DISCHARGE DIAGNOSES:  1. Intractable nausea and vomiting secondary to presumed gastroenteritis     with possible diabetic gastroparesis - resolved.  2. History of diabetes type 2.  3. History of hypertension.  4. History of dyslipidemia.  5. History of gastroesophageal reflux disease.  6. History of medical noncompliance.   ALLERGIES:  1. PENICILLIN.  2. ERYTHROMYCIN.   DISCHARGE LABORATORIES:  WBC 11.3.  Hemoglobin 15.0, hematocrit 44.2, MCV  82.4, platelet count 284.  Sodium 136, potassium 3.6, chloride 105, CO2 27,  glucose 181, BUN 10, creatinine 0.7.   DISCHARGE MEDICATIONS:  The patient is to resume all her preadmission  medications, which include -  1. Avandia 4 mg p.o. daily.  2. Prevacid 20 mg p.o. daily.  3. Captopril/HCTZ 50/25 mg p.o. daily.  4. Lantus 100 units subcutaneous q.h.s.  5. Lovastatin 20 mg p.o. q.h.s.  6. Tricor 1 mg p.o. q.h.s.  7. Reglan 5 mg p.o. t.i.d.   ACTIVITY:  As directed.   DIET:  A 2000 calorie ADA diet.   FOLLOW UP:  With her primary care physician, Dr. Prince Rome, in about 10-14 days.  The patient is to call for an appointment.   PATIENT INSTRUCTIONS:  She has been told to report her M.D. if she  experiences any problems including, but not limited to, fever, nausea,  vomiting, abdominal pain.   REASON FOR HOSPITALIZATION:  Intractable nausea and vomiting.  Please see  the admission H&P by Dr. Virginia Rochester regarding the patient's presenting  symptoms and signs, assessment, and plan at the time of admission.   The patient presented  with nausea and vomiting, and had seen her primary  care physician, who directed her to come to the ED, according to the  admission H&P by Dr. Virginia Rochester.  The patient denied any history of  headaches, visual changes, dysphagia, chest pain, shortness of breath, or  palpitations at the time of admission.  She complained of some fatigue  overall at the time of admission.  She denied any history of hematuria,  dysuria, constipation, or diarrhea.  She was said to be dehydrated, and  therefore was admitted to the hospital for further evaluation.   HOSPITAL COURSE:  On admission, the patient had an abdominal x-ray, which  was unremarkable.  She was admitted to the hospitalist's service.  She was  started on IV fluid supplementation for dehydration.  X-ray of the abdomen  was negative for any ileus.  She was started on Reglan for  presumptive  complaint of diabetic gastroparesis.  She received some antibiotics-emetics  and anti-nauseants with some good effect.  The patient's symptoms have  resolved.  She is able to tolerate a full diet overnight, and she is okay  for discharge today.  The patient's symptoms were likely due to  gastroenteritis with some dehydration; however, a component of diabetic  gastroparesis cannot be ruled out at this point, and, therefore, she is  going to be discharged home on Reglan.   CONDITION ON DISCHARGE:  The patient was discharged home in stable and  satisfactory condition.  She does not have any acute electrolyte  abnormalities.  Her vital signs this morning included a blood pressure of  110/52, with a pulse rate of 80, and temperature of 96.6 degrees Fahrenheit.  Her CBG was 167 mg/dl.                                               Jackie Plum, M.D.    GO/MEDQ  D:  05/21/2003  T:  05/21/2003  Job:  [id number]   cc:   Lillia Carmel, M.D.  [address]  [place] [address]  Fax: [phone]

## 2010-10-01 NOTE — Op Note (Signed)
Karen [name], Karen [name]                 ACCOUNT NO.:  [id number]   MEDICAL RECORD NO.:  [id number]          PATIENT TYPE:  AMB   LOCATION:  DAY                          FACILITY:  [facility]   PHYSICIAN:  Almedia Balls. Fore, M.D.   DATE OF BIRTH:  08-13-1955   DATE OF PROCEDURE:  03/07/2005  DATE OF DISCHARGE:                                 OPERATIVE REPORT   PREOPERATIVE DIAGNOSIS:  Abnormal uterine bleeding, diabetes mellitus.   POSTOPERATIVE DIAGNOSIS:  Abnormal uterine bleeding, diabetes mellitus.  Pending pathology.   OPERATION:  Diagnostic hysteroscopy, fractional D&C.   ANESTHESIA:  MAC with 10 mL 1% lidocaine paracervical block.   INDICATIONS FOR SURGERY:  The patient is a 55 year old with abnormal uterine  bleeding slightly uterine enlargement, diabetes mellitus for hysteroscopy  D&C. She has been fully counseled as to the nature of the procedure and the  risks involved to include risks of anesthesia, injury to uterus, tubes,  ovaries, bowel, bladder blood vessels, ureters, postoperative hemorrhage,  infection, recuperation. She fully understands all these considerations and  has signed informed consent to proceed on 07 March 2005.   OPERATIVE FINDINGS:  On bimanual exam, the uterus was mid posterior,  approximately [redacted] weeks gestational size. There were no palpable adnexal  masses. On hysteroscopy, the uterus sounded to 8.5 cm. The endocervical  canal was clean. The endometrial cavity had a moderate amount of old blood  and shaggy appearing tissue present.   DESCRIPTION OF PROCEDURE:  With the patient under sedation, prepared and  draped in the usual sterile fashion, a speculum was placed in the vagina. A  solution of 1% lidocaine was injected at 3, 9, and 12 o'clock positions for  a total of 10 mL for paracervical block. A single-tooth tenaculum was placed  at the 12 o'clock position on the cervix, and small sharp curette was used  for curettage of the endocervical canal. The  uterus was sounded as noted  above and the cervix was dilated up to a #21 Pratt dilator. The diagnostic  hysteroscope was introduced using free flow of [place] under direct vision  with the above-noted findings. The hysteroscope was removed, and a medium  sharp curette and polyp forceps were used to remove tissue from the  endometrial cavity. When no further tissue was obtained, the hysteroscope  was then re-employed to ensure that all tissue had been removed and that  hemostasis was maintained. After noting that this was the case, the  procedure was terminated. Estimated blood loss less than 25 mL. The patient  was taken to the recovery room in good condition.   FOLLOW-UP CARE:  She is to return to the office in 2 weeks for follow-up and  to call if heavy bleeding, pain or unexplained fever should ensue. She was  instructed to avoid the vaginal penetrance for 2 weeks as well.           ______________________________  Almedia Balls. Randell Patient, M.D.     SRF/MEDQ  D:  03/07/2005  T:  03/07/2005  Job:  [id number]

## 2010-10-05 ENCOUNTER — Ambulatory Visit (INDEPENDENT_AMBULATORY_CARE_PROVIDER_SITE_OTHER): Payer: Self-pay | Admitting: Family Medicine

## 2010-10-05 DIAGNOSIS — Z111 Encounter for screening for respiratory tuberculosis: Secondary | ICD-10-CM

## 2010-10-06 NOTE — Progress Notes (Signed)
Ppd placed

## 2010-10-07 ENCOUNTER — Ambulatory Visit (INDEPENDENT_AMBULATORY_CARE_PROVIDER_SITE_OTHER): Payer: Self-pay | Admitting: Family Medicine

## 2010-10-07 DIAGNOSIS — Z111 Encounter for screening for respiratory tuberculosis: Secondary | ICD-10-CM

## 2010-10-07 LAB — TB SKIN TEST: Induration: 0

## 2010-10-07 NOTE — Progress Notes (Signed)
  Subjective:    Patient ID: Karen [name], female    DOB: 04/05/1956, 55 y.o.   MRN: [id number]  HPI    Review of Systems     Objective:   Physical Exam        Assessment & Plan:

## 2010-10-15 ENCOUNTER — Telehealth: Payer: Self-pay | Admitting: *Deleted

## 2010-10-15 NOTE — Telephone Encounter (Signed)
FYI only-Called patient and was advised that 10 vials of Lantus were received from the patient assistance program. She will come get the medication.

## 2010-10-15 NOTE — Telephone Encounter (Signed)
Noted thanks °

## 2011-01-22 ENCOUNTER — Emergency Department (HOSPITAL_COMMUNITY)
Admission: EM | Admit: 2011-01-22 | Discharge: 2011-01-22 | Disposition: A | Discharge status: Home / Self Care | Payer: Self-pay | Attending: Emergency Medicine | Admitting: Emergency Medicine

## 2011-01-22 DIAGNOSIS — K089 Disorder of teeth and supporting structures, unspecified: Secondary | ICD-10-CM | POA: Insufficient documentation

## 2011-01-22 DIAGNOSIS — Z79899 Other long term (current) drug therapy: Secondary | ICD-10-CM | POA: Insufficient documentation

## 2011-01-22 DIAGNOSIS — I10 Essential (primary) hypertension: Secondary | ICD-10-CM | POA: Insufficient documentation

## 2011-01-22 DIAGNOSIS — Z794 Long term (current) use of insulin: Secondary | ICD-10-CM | POA: Insufficient documentation

## 2011-01-22 DIAGNOSIS — K029 Dental caries, unspecified: Secondary | ICD-10-CM | POA: Insufficient documentation

## 2011-01-22 DIAGNOSIS — E119 Type 2 diabetes mellitus without complications: Secondary | ICD-10-CM | POA: Insufficient documentation

## 2011-01-22 DIAGNOSIS — F3289 Other specified depressive episodes: Secondary | ICD-10-CM | POA: Insufficient documentation

## 2011-01-22 DIAGNOSIS — K0381 Cracked tooth: Secondary | ICD-10-CM | POA: Insufficient documentation

## 2011-01-22 DIAGNOSIS — E785 Hyperlipidemia, unspecified: Secondary | ICD-10-CM | POA: Insufficient documentation

## 2011-01-22 DIAGNOSIS — R221 Localized swelling, mass and lump, neck: Secondary | ICD-10-CM | POA: Insufficient documentation

## 2011-01-22 DIAGNOSIS — R220 Localized swelling, mass and lump, head: Secondary | ICD-10-CM | POA: Insufficient documentation

## 2011-01-22 DIAGNOSIS — F329 Major depressive disorder, single episode, unspecified: Secondary | ICD-10-CM | POA: Insufficient documentation

## 2011-03-11 ENCOUNTER — Emergency Department (HOSPITAL_COMMUNITY)
Admission: EM | Admit: 2011-03-11 | Discharge: 2011-03-12 | Disposition: A | Discharge status: Home / Self Care | Payer: Self-pay | Attending: Emergency Medicine | Admitting: Emergency Medicine

## 2011-03-11 DIAGNOSIS — Z794 Long term (current) use of insulin: Secondary | ICD-10-CM | POA: Insufficient documentation

## 2011-03-11 DIAGNOSIS — M79609 Pain in unspecified limb: Secondary | ICD-10-CM | POA: Insufficient documentation

## 2011-03-11 DIAGNOSIS — M545 Low back pain, unspecified: Secondary | ICD-10-CM | POA: Insufficient documentation

## 2011-03-11 DIAGNOSIS — E119 Type 2 diabetes mellitus without complications: Secondary | ICD-10-CM | POA: Insufficient documentation

## 2011-03-11 DIAGNOSIS — E785 Hyperlipidemia, unspecified: Secondary | ICD-10-CM | POA: Insufficient documentation

## 2011-03-11 DIAGNOSIS — I10 Essential (primary) hypertension: Secondary | ICD-10-CM | POA: Insufficient documentation

## 2011-03-12 LAB — CBC
HCT: 40.2 % (ref 36.0–46.0)
Hemoglobin: 13.6 g/dL (ref 12.0–15.0)
MCH: 28 pg (ref 26.0–34.0)
MCHC: 33.8 g/dL (ref 30.0–36.0)

## 2011-03-12 LAB — URINE MICROSCOPIC-ADD ON

## 2011-03-12 LAB — COMPREHENSIVE METABOLIC PANEL
BUN: 13 mg/dL (ref 6–23)
Calcium: 9.4 mg/dL (ref 8.4–10.5)
Creatinine, Ser: 0.45 mg/dL — ABNORMAL LOW (ref 0.50–1.10)
GFR calc Af Amer: >90 mL/min (ref >90)
Glucose, Bld: 369 mg/dL — ABNORMAL HIGH (ref 70–99)
Total Protein: 6.9 g/dL (ref 6.0–8.3)

## 2011-03-12 LAB — DIFFERENTIAL
Basophils Absolute: 0 10*3/uL (ref 0.0–0.1)
Basophils Relative: 0 % (ref 0–1)
Eosinophils Relative: 3 % (ref 0–5)
Monocytes Absolute: 0.3 10*3/uL (ref 0.1–1.0)

## 2011-03-12 LAB — URINALYSIS, ROUTINE W REFLEX MICROSCOPIC
Leukocytes, UA: NEGATIVE
Nitrite: NEGATIVE
Specific Gravity, Urine: 1.03 (ref 1.005–1.030)
pH: 5.5 (ref 5.0–8.0)

## 2011-03-16 ENCOUNTER — Ambulatory Visit (INDEPENDENT_AMBULATORY_CARE_PROVIDER_SITE_OTHER): Payer: Self-pay | Admitting: Family Medicine

## 2011-03-16 ENCOUNTER — Ambulatory Visit (INDEPENDENT_AMBULATORY_CARE_PROVIDER_SITE_OTHER)
Admission: RE | Admit: 2011-03-16 | Discharge: 2011-03-16 | Disposition: A | Discharge status: Home / Self Care | Payer: Self-pay | Source: Ambulatory Visit | Attending: Family Medicine | Admitting: Family Medicine

## 2011-03-16 ENCOUNTER — Encounter: Payer: Self-pay | Admitting: Family Medicine

## 2011-03-16 VITALS — BP 110/72 | HR 76 | Temp 98.7°F | Wt 186.4 lb

## 2011-03-16 DIAGNOSIS — E119 Type 2 diabetes mellitus without complications: Secondary | ICD-10-CM

## 2011-03-16 DIAGNOSIS — M545 Low back pain, unspecified: Secondary | ICD-10-CM

## 2011-03-16 DIAGNOSIS — M5416 Radiculopathy, lumbar region: Secondary | ICD-10-CM | POA: Insufficient documentation

## 2011-03-16 LAB — HEMOGLOBIN A1C: Hgb A1c MFr Bld: 11.1 % — ABNORMAL HIGH (ref 4.6–6.5)

## 2011-03-16 MED ORDER — HYDROCODONE-ACETAMINOPHEN 5-325 MG PO TABS: 1.0000 | ORAL_TABLET | Freq: Four times a day (QID) | ORAL | Status: DC | PRN

## 2011-03-16 MED ORDER — CYCLOBENZAPRINE HCL 10 MG PO TABS: 10.0000 mg | ORAL_TABLET | Freq: Three times a day (TID) | ORAL | Status: DC | PRN

## 2011-03-16 NOTE — Assessment & Plan Note (Addendum)
With radiculopathy and mild neurological deficit. No other red flags. Check LSpine today - negative for fracture, infection, other. Hesitant for steroids given uncontrolled dm. Treat with continued norco, flexeril, ibuprofen. Update me if any red flags.  If not improving may need steroids/MRI/neuro referral. Recheck when returns for diabetes recheck.

## 2011-03-16 NOTE — Assessment & Plan Note (Signed)
poor control as evidenced by recall cbg's. Reports compliance with meds, not with diet. Discussed importance of sugar control, hyperglycemia and damage to body. RTC within the month for further diabetes f/u, will likely increase lantus.

## 2011-03-16 NOTE — Patient Instructions (Addendum)
Xray today. Continue ibuprofen or naproxyn regularly (same family.) Use norco for breathrough pain, may use flexeril as well (both these medicines will make you sleepy so take at home). Your sugar is too high!  Watch sweets.  Please return within the month to check on diabetes.  Keep log and bring to appointment then.

## 2011-03-16 NOTE — Progress Notes (Signed)
  Subjective:    Patient ID: Karen [name], female    DOB: 02-05-1956, 55 y.o.   MRN: [id number]  HPI CC: f/u ER  55yo with h/o uncontrolled DM who presented as new patient 08/2010, did not f/u presents today as ER f/u for possible sciatica.  ER records reviewed - Urine >1000glu.  Glu 369.  No blood in urine or evidence of infection.  Thought radiculopathy, placed on norco and valium and sent home.  Pain started 6d ago that started R lower back and travels down hip and posterior leg/thigh, sometimes feels down to foot on right side.  Described as constant burning pain, sometimes ache.  Improved with norco (relief for 3 hours).  Ibuprofen doesn't help.  Valium doesn't help.  Denies fevers/chills, numbness, weakness, bowel/bladder incontinence.  Never had anything like this before.  No dysuria, abd pain, hematuria or blood in stool.  DM - sugars running high.  250-300s per recall, doesn't bring log.  Checks in morning and at night.  "That's my fault because of what I eat".  Lots of sweets recently.  Cannot afford [place].  Has had swelling to avandia in past.  Reports compliance with insulin and metformin.  Alternates novolog with novolin R for mealtime insulin.  Working - different shifts.  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  Doesn't think could schedule diabetes education.  Not fasting this morning.  Review of Systems Per HPI    Objective:   Physical Exam  Nursing note and vitals reviewed. Constitutional: She appears well-developed and well-nourished. No distress.  HENT:  Head: Normocephalic and atraumatic.  Mouth/Throat: Oropharynx is clear and moist. No oropharyngeal exudate.  Cardiovascular: Normal rate, regular rhythm, normal heart sounds and intact distal pulses.   No murmur heard. Pulses:      Dorsalis pedis pulses are 2+ on the right side, and 2+ on the left side.       Posterior tibial pulses are 2+ on the right side, and 2+ on the left side.    Pulmonary/Chest: Effort normal and breath sounds normal. No respiratory distress. She has no wheezes. She has no rales.  Abdominal: Soft. Bowel sounds are normal. She exhibits no distension. There is no tenderness. There is no rebound and no guarding.  Musculoskeletal:       Diabetic foot exam: Normal inspection No skin breakdown No calluses  Normal DP/PT pulses Normal sensation to light touch and decreased to monofilament on right, mildly Nails normal  No midline spine tenderness, no paraspinous mm tenderness, some tenderness lateral to right paraspinous mm, + tender at sciatic notch on right. No GTB or SIJ pain.  + SLR on right.    Neurological: No sensory deficit.  Reflex Scores:      Bicep reflexes are 2+ on the right side and 2+ on the left side.      Patellar reflexes are 0 on the right side and 2+ on the left side.      Decreased sensation to cold right dorsal foot, decreased monofilament on right mildly Able to heel and toe walk. 5/5 strength LLE 4+/5 strength RLE  Skin: Skin is warm and dry. No rash noted.  Psychiatric: She has a normal mood and affect.      Assessment & Plan:

## 2011-03-23 ENCOUNTER — Encounter: Payer: Self-pay | Admitting: Family Medicine

## 2011-03-23 ENCOUNTER — Ambulatory Visit (INDEPENDENT_AMBULATORY_CARE_PROVIDER_SITE_OTHER): Payer: Self-pay | Admitting: Family Medicine

## 2011-03-23 VITALS — BP 124/72 | HR 84 | Temp 98.1°F | Wt 186.0 lb

## 2011-03-23 DIAGNOSIS — R3989 Other symptoms and signs involving the genitourinary system: Secondary | ICD-10-CM | POA: Insufficient documentation

## 2011-03-23 DIAGNOSIS — N399 Disorder of urinary system, unspecified: Secondary | ICD-10-CM

## 2011-03-23 DIAGNOSIS — R1031 Right lower quadrant pain: Secondary | ICD-10-CM

## 2011-03-23 DIAGNOSIS — R1030 Lower abdominal pain, unspecified: Secondary | ICD-10-CM | POA: Insufficient documentation

## 2011-03-23 LAB — POCT URINALYSIS DIPSTICK
Nitrite, UA: NEGATIVE
Urobilinogen, UA: 0.2

## 2011-03-23 NOTE — Patient Instructions (Addendum)
Urine looking ok.   Doesn't look like kidney stone or bladder infection. Push water.  Tea with caffeine is a bladder irritant. If not improving, let me know. Your sugar was too high in the urine!!  More water will help with this as well as a low sugar, low carb diet.

## 2011-03-23 NOTE — Progress Notes (Signed)
  Subjective:    Patient ID: Karen [name], female    DOB: July 21, 1955, 55 y.o.   MRN: [id number]  HPI CC: ?UTI  1 wk h/o lower right abd pain feels sharp/stabbing, intermittent.  No radiation.  Also noticed urinary hesitancy at beginning of stream.  Feels not completely emptying.  Appetite intact.  No dysuria, urgency, frequency.  No fevers/chills, nausea/vomiting, diarrhea.  Mild constipation.  Not really drinking water.  More tea.  Review of Systems Per HPI    Objective:   Physical Exam  Nursing note and vitals reviewed. Constitutional: She appears well-developed and well-nourished. No distress.  HENT:  Head: Normocephalic and atraumatic.  Mouth/Throat: Oropharynx is clear and moist. No oropharyngeal exudate.  Eyes: Conjunctivae and EOM are normal. Pupils are equal, round, and reactive to light. No scleral icterus.  Neck: Normal range of motion. Neck supple.  Cardiovascular: Normal rate, regular rhythm, normal heart sounds and intact distal pulses.   No murmur heard. Pulmonary/Chest: Effort normal and breath sounds normal. No respiratory distress. She has no wheezes. She has no rales.  Abdominal: Soft. Bowel sounds are normal. She exhibits no distension and no mass. There is no hepatosplenomegaly. There is no tenderness. There is no rebound, no guarding and no CVA tenderness.  Skin: Skin is warm and dry. No rash noted.  Psychiatric: She has a normal mood and affect.       Assessment & Plan:

## 2011-03-23 NOTE — Assessment & Plan Note (Signed)
Unclear etiology. Exam benign Doubt infection or stone or significant intraabdominal process. UA/micro overall normal. Push fluids and avoid caffeine. If not improving, let me know.

## 2011-03-29 ENCOUNTER — Telehealth: Payer: Self-pay | Admitting: *Deleted

## 2011-03-29 MED ORDER — PREDNISONE 20 MG PO TABS: 40.0000 mg | ORAL_TABLET | Freq: Every day | ORAL | Status: AC

## 2011-03-29 MED ORDER — HYDROCODONE-ACETAMINOPHEN 5-325 MG PO TABS: 1.0000 | ORAL_TABLET | Freq: Four times a day (QID) | ORAL | Status: DC | PRN

## 2011-03-29 NOTE — Telephone Encounter (Signed)
May refill norco (phone in). Notify I'd like to start steroid.  Sent in 7d course to pharmacy.  dont' take with ibuprofen, will need to watch sugar closely - will raise numbers so will need to ensure drinking plenty of water, limit carbs. If not improved, may schedule MRI vs referral to back center.

## 2011-03-29 NOTE — Telephone Encounter (Signed)
Patient called and said she is still in pain. Pain meds only lasting ~ 3 hours. She doesn't take them at work and only takes ibuprofen or tylenol at work-and they are no help. By the time she gets home, she is in severe pain and it takes a while for the pain meds to kick in. Ice and heat have been no help. No swelling, no numbness/tingling, no bowel or bladder issues. She only has a few more pain pills and muscle relaxers left. Is there anything else you can suggest for her?

## 2011-03-30 NOTE — Telephone Encounter (Signed)
Rx called in. Message left notifying patient. Advised to monitor sugars extremely close due to steroids. Instructed to drink plenty of water and limit carbs. Advised to call with any questions or problems.

## 2011-04-04 ENCOUNTER — Other Ambulatory Visit: Payer: Self-pay | Admitting: Family Medicine

## 2011-04-04 NOTE — Telephone Encounter (Signed)
ok 

## 2011-04-04 NOTE — Telephone Encounter (Signed)
OK to refill

## 2011-04-11 ENCOUNTER — Telehealth: Payer: Self-pay | Admitting: *Deleted

## 2011-04-11 NOTE — Telephone Encounter (Signed)
Patient called and said that we should be receiving a refill request for her pain meds. She said she never took the steroids because she was afraid that her sugars would get more out of control than they already are. She was just afraid to take them. She said she was still having the same amount of pain as before and it runs down into her leg. She said the pain med works some if she takes it with the flexeril. I advised that she would probably need a MRI to determine what the next step would be because the pain med isn't a treatment and we need to find out what the the cause of her pain is. She verbalized understanding, but wanted to remind you that she is self-pay and had very limited income. I advised that the health system has a patient assistance program if needed that would possibly be able to help her. She said she understood and would wait to hear back from [place] about what to do.

## 2011-04-12 NOTE — Telephone Encounter (Signed)
Patient is aware of the need for follow up. Will await refill request.

## 2011-04-12 NOTE — Telephone Encounter (Signed)
Will refill pain med when we receive request. If worsening pain or leg weakness or new symptoms, needs to return for further evaluation. Due for DM f/u as well.

## 2011-04-14 ENCOUNTER — Other Ambulatory Visit: Payer: Self-pay | Admitting: *Deleted

## 2011-04-14 NOTE — Telephone Encounter (Signed)
Opened in error

## 2011-04-15 ENCOUNTER — Ambulatory Visit (INDEPENDENT_AMBULATORY_CARE_PROVIDER_SITE_OTHER): Payer: Self-pay | Admitting: Family Medicine

## 2011-04-15 ENCOUNTER — Encounter: Payer: Self-pay | Admitting: Family Medicine

## 2011-04-15 VITALS — BP 134/74 | HR 82 | Temp 98.3°F | Wt 181.5 lb

## 2011-04-15 DIAGNOSIS — M5416 Radiculopathy, lumbar region: Secondary | ICD-10-CM

## 2011-04-15 DIAGNOSIS — IMO0002 Reserved for concepts with insufficient information to code with codable children: Secondary | ICD-10-CM

## 2011-04-15 DIAGNOSIS — J029 Acute pharyngitis, unspecified: Secondary | ICD-10-CM

## 2011-04-15 LAB — POCT RAPID STREP A (OFFICE): Rapid Strep A Screen: NEGATIVE

## 2011-04-15 MED ORDER — MELOXICAM 15 MG PO TABS: ORAL_TABLET | ORAL | Status: DC

## 2011-04-15 MED ORDER — GABAPENTIN 300 MG PO CAPS: 300.0000 mg | ORAL_CAPSULE | Freq: Every day | ORAL | Status: DC

## 2011-04-15 MED ORDER — HYDROCODONE-ACETAMINOPHEN 5-325 MG PO TABS: 1.0000 | ORAL_TABLET | Freq: Four times a day (QID) | ORAL | Status: DC | PRN

## 2011-04-15 NOTE — Patient Instructions (Addendum)
Let's give this a bit more time. If worsening pain or numbness or weakness let me know. Start gabapentin for nerve pain 300mg  at bedtime may increase to twice daily after 1 wk if tolerating ok. Start meloxicam 15mg  daily for 1 week then as needed, take with food. Refilled vicodin today. For throat - rapid strep negative.  Sounds more viral. Push fluids and plenty of rest. May use meloxicam for throat inflammation as well. Salt water gargles. Suck on cold things like popsicles or warm things like herbal teas (whichever soothes the throat better). Return if fever >101.5, worsening pain, or trouble opening/closing mouth, or hoarse voice. Good to see you today, call clinic with questions.

## 2011-04-15 NOTE — Progress Notes (Signed)
  Subjective:    Patient ID: Karen [name], female    DOB: 04/12/1956, 55 y.o.   MRN: [id number]  HPI CC: f/u back pain  Seen 03/16/2011 with dx lumbar radiculopathy with mild neurological impairment in form of some R dorsal foot numbness as well as burning pain anterior leg at knee and below and 4+/5 strength RLE.  R lower back and travels down hip and lateral leg/thigh, sometimes feels down to foot on right side. Described as constant burning pain, sometimes ache.  Improved with vicodin (relief for 3 hours).  Had to increase to 2 tablets.  Even this is not helping pain - wakes up at night in pain.  Ibuprofen doesn't help. Also using flexeril along with vicodin.  Continued pain, not worsening but no significant improvement for last 4 wks.  Denies fevers/chills, weakness, bowel/bladder incontinence, saddle anesthesia. Never had anything like this before.  No falls.  ST - previously with cold sxs, took zyrtec which helped some.  No cough.  Head congestion has improved.  Throat still hurting.  Would like strep tested as this is how strep has presented in past.  No fevers, body aches.  + sick contacts at work.  No smokers at home.  No COPD/asthma.  doesn't want to take prednisone 2/2 h/o DM.  Review of Systems Per HPI    Objective:   Physical Exam  Nursing note and vitals reviewed. Constitutional: She appears well-developed and well-nourished. No distress.  HENT:  Head: Normocephalic and atraumatic.  Right Ear: Hearing, tympanic membrane, external ear and ear canal normal.  Left Ear: Hearing, tympanic membrane, external ear and ear canal normal.  Nose: No mucosal edema or rhinorrhea. Right sinus exhibits no maxillary sinus tenderness and no frontal sinus tenderness. Left sinus exhibits no maxillary sinus tenderness and no frontal sinus tenderness.  Mouth/Throat: Uvula is midline and mucous membranes are normal. Posterior oropharyngeal erythema present. No oropharyngeal exudate, posterior  oropharyngeal edema or tonsillar abscesses.  Eyes: Conjunctivae and EOM are normal. Pupils are equal, round, and reactive to light. No scleral icterus.  Neck: Normal range of motion. Neck supple. No JVD present. No thyromegaly present.  Cardiovascular: Normal rate, regular rhythm, normal heart sounds and intact distal pulses.   No murmur heard. Pulmonary/Chest: Effort normal and breath sounds normal. No respiratory distress. She has no wheezes. She has no rales.  Musculoskeletal:       No midline spine tenderness. Mild R SIJ tenderness. Neg SLR bilaterally Neg FABER   Lymphadenopathy:    She has no cervical adenopathy.  Neurological: She has normal strength. No sensory deficit. She displays no Babinski's sign on the right side. She displays no Babinski's sign on the left side.  Reflex Scores:      Patellar reflexes are 0 on the right side and 1+ on the left side.      Achilles reflexes are 0 on the right side and 0 on the left side.      5/5 strength BLE  Skin: Skin is warm and dry. No rash noted.       Assessment & Plan:

## 2011-04-16 ENCOUNTER — Encounter: Payer: Self-pay | Admitting: Family Medicine

## 2011-04-16 DIAGNOSIS — J029 Acute pharyngitis, unspecified: Secondary | ICD-10-CM | POA: Insufficient documentation

## 2011-04-16 NOTE — Assessment & Plan Note (Signed)
Continued right lumbar radiculopathy with mild neurological deficit in form of continued decreased reflexes, however improvement in strength compared to last visit (4+/5 to 5/5 RLE) and improvement in numbness. Pain remains. Given improvement, will continue to monitor for now.  No other red flags. Last visit L spine negative for fracture, infection, significant arthritis. Continue vicodin, flexeril and change ibuprofen to meloxicam, add gabapentin. To let me know if worsening weakness or pain, otherwise, return 1 mo for f/u. If remaining pain/weakness, consider MRI vs PT vs referral to spine doctor although pt hesitant given financial difficulties

## 2011-04-16 NOTE — Assessment & Plan Note (Signed)
Anticipate viral pharyngitis.  RST neg. Supportive care discussed.  Update [place] if sxs worsening or fever >101.

## 2011-05-06 ENCOUNTER — Telehealth: Payer: Self-pay | Admitting: *Deleted

## 2011-05-06 ENCOUNTER — Other Ambulatory Visit: Payer: Self-pay | Admitting: Family Medicine

## 2011-05-06 DIAGNOSIS — M5416 Radiculopathy, lumbar region: Secondary | ICD-10-CM

## 2011-05-06 MED ORDER — HYDROCODONE-ACETAMINOPHEN 5-325 MG PO TABS: 1.0000 | ORAL_TABLET | Freq: Four times a day (QID) | ORAL | Status: DC | PRN

## 2011-05-06 MED ORDER — CYCLOBENZAPRINE HCL 10 MG PO TABS: 10.0000 mg | ORAL_TABLET | Freq: Two times a day (BID) | ORAL | Status: DC | PRN

## 2011-05-06 NOTE — Telephone Encounter (Signed)
refilled meds.  plz phone in norco. Will place referral for MRI after first of year indication 25mo h/o R lumbar radiculopathy with continued neurological impairment failed conservative treatment Update [place] if worsening.

## 2011-05-06 NOTE — Telephone Encounter (Signed)
Spoke with patient. She is requesting to go ahead with an MRI if possible. Her back and leg are still bothering her a great deal and now her thigh is going numb. She can't take the gabapentin due to feeling like she has a hangover the next day and the other meds are minimal help. She said she will have insurance after the first of the year and asks to wait until then for the MRI if she can wait that long. She also asked for a refill on meds.

## 2011-05-06 NOTE — Telephone Encounter (Signed)
Rx called in as directed and patient aware of Rx and MRI order.

## 2011-05-31 ENCOUNTER — Other Ambulatory Visit: Payer: Self-pay | Admitting: Family Medicine

## 2011-05-31 NOTE — Telephone Encounter (Signed)
Noted. Thanks.

## 2011-05-31 NOTE — Telephone Encounter (Signed)
OK to refill? Last refill 04-27-11

## 2011-05-31 NOTE — Telephone Encounter (Signed)
Rx called in as directed. Spoke with patient. She said she is waiting to receive her insurance card before scheduling the MRI. She has talked to [facility] and is supposed to call her back when she receives her card. She was going to schedule a follow up with you based on when she has her MRI done.

## 2011-05-31 NOTE — Telephone Encounter (Signed)
May refill x1.  Please call in.  We scheduled her for MRI for 05/2011 - has that been done?  Also, pt was scheduled to f/u with me but hasn't returned. Can we call pt for an update?

## 2011-06-12 ENCOUNTER — Other Ambulatory Visit: Payer: Self-pay | Admitting: Family Medicine

## 2011-06-12 NOTE — Telephone Encounter (Signed)
plz phone in. 

## 2011-06-13 NOTE — Telephone Encounter (Signed)
Rx called in as directed.   

## 2011-06-20 ENCOUNTER — Encounter: Payer: Self-pay | Admitting: Family Medicine

## 2011-06-20 ENCOUNTER — Encounter: Payer: Self-pay | Admitting: *Deleted

## 2011-06-20 ENCOUNTER — Ambulatory Visit (INDEPENDENT_AMBULATORY_CARE_PROVIDER_SITE_OTHER): Payer: PRIVATE HEALTH INSURANCE | Admitting: Family Medicine

## 2011-06-20 VITALS — BP 104/72 | HR 97 | Temp 99.2°F | Wt 177.2 lb

## 2011-06-20 DIAGNOSIS — J069 Acute upper respiratory infection, unspecified: Secondary | ICD-10-CM

## 2011-06-20 MED ORDER — DOXYCYCLINE HYCLATE 100 MG PO CAPS: 100.0000 mg | ORAL_CAPSULE | Freq: Two times a day (BID) | ORAL | Status: AC

## 2011-06-20 NOTE — Progress Notes (Signed)
SUBJECTIVE:  Karen [name] is a 56 y.o. female who complains of coryza, congestion, sore throat, myalgias and headache for 7 days. She denies a history of anorexia, chest pain and dizziness and denies a history of asthma. Patient denies smoke cigarettes.   Patient Active Problem List  Diagnoses  . Bronchitis  . Depression  . Diabetes mellitus  . GERD (gastroesophageal reflux disease)  . Seasonal allergies  . High blood pressure  . HLD (hyperlipidemia)  . Lumbar radiculopathy  . Pharyngitis   Past Medical History  Diagnosis Date  . History of chicken pox   . Depression   . Diabetes mellitus     dx 1993  . Chronic bronchitis   . Syncopal episodes   . GERD (gastroesophageal reflux disease)   . Seasonal allergies   . High blood pressure   . HLD (hyperlipidemia)   . Seizure 1977    off AEDs  . History of ovarian cyst   . Tubal pregnancy   . Tachyarrhythmia     ER visits with adenosine push to control   Past Surgical History  Procedure Date  . Tonsillectomy and adenoidectomy 1965  . Ovarian cyst surgery 1998  . Dilation and curettage of uterus 1984  . Ectopic pregnancy surgery 1984    R tube removal 2/2 hemorrhage  . Rotator cuff repair 2006    Right   History  Substance Use Topics  . Smoking status: Never Smoker   . Smokeless tobacco: Never Used  . Alcohol Use: No   Family History  Problem Relation Age of Onset  . Diabetes Mother   . Hyperlipidemia Mother   . Hypertension Mother   . Stroke Mother   . Coronary artery disease Mother   . Heart disease Mother   . Diabetes Father   . Hypertension Father   . Hyperlipidemia Father   . Coronary artery disease Father   . Heart disease Father   . Prostate cancer Father   . Pancreatic cancer Father   . Heart disease Brother   . Diabetes Maternal Grandmother   . Diabetes Maternal Grandfather   . Diabetes Paternal Grandmother   . Diabetes Paternal Grandfather   . Kidney disease Neg Hx   . Cancer Neg Hx     Allergies  Allergen Reactions  . Erythromycin Other (See Comments)    Stomach cramps  . Penicillins Rash   Current Outpatient Prescriptions on File Prior to Visit  Medication Sig Dispense Refill  . atorvastatin (LIPITOR) 40 MG tablet Take 40 mg by mouth at bedtime.        . cetirizine (ZYRTEC) 10 MG tablet Take 10 mg by mouth daily as needed.        . Cholecalciferol (VITAMIN D3) 2000 UNITS TABS Take 1 tablet by mouth daily.        . citalopram (CELEXA) 40 MG tablet Take 1 tablet (40 mg total) by mouth daily.  90 tablet  3  . cyclobenzaprine (FLEXERIL) 10 MG tablet Take 1 tablet (10 mg total) by mouth 2 (two) times daily as needed for muscle spasms.  60 tablet  0  . fish oil-omega-3 fatty acids 1000 MG capsule Take 1 g by mouth daily.        Marland Kitchen gabapentin (NEURONTIN) 300 MG capsule Take 1 capsule (300 mg total) by mouth at bedtime.  30 capsule  1  . HYDROcodone-acetaminophen (NORCO) 5-325 MG per tablet Take 1 tablet by mouth every 6 (six) hours as needed for pain.  30 tablet  0  . insulin aspart (NOVOLOG) 100 UNIT/ML injection Inject 20 Units into the skin 3 (three) times daily before meals.        . insulin glargine (LANTUS) 100 UNIT/ML injection Inject 100 Units into the skin at bedtime.        . insulin regular (NOVOLIN R) 100 UNIT/ML injection Inject 0.2 mLs (20 Units total) into the skin 3 (three) times daily before meals.  10 mL  12  . lisinopril (PRINIVIL,ZESTRIL) 10 MG tablet Take 1 tablet (10 mg total) by mouth daily.  90 tablet  3  . meloxicam (MOBIC) 15 MG tablet Take one oral daily for 1 wk with food then as needed pain  30 tablet  0  . metFORMIN (GLUCOPHAGE) 1000 MG tablet Take 1 tablet (1,000 mg total) by mouth 2 (two) times daily with a meal.  180 tablet  3  . metoprolol tartrate (LOPRESSOR) 12.5 mg TABS Take 0.5 tablets (12.5 mg total) by mouth 2 (two) times daily.  180 tablet  3  . omeprazole (PRILOSEC) 20 MG capsule Take 1 capsule (20 mg total) by mouth daily.      .  vitamin E 400 UNIT capsule Take 400 Units by mouth daily.         The PMH, PSH, Social History, Family History, Medications, and allergies have been reviewed in [facility], and have been updated if relevant.  OBJECTIVE: BP 104/72  Pulse 97  Temp(Src) 99.2 F (37.3 C) (Oral)  Wt 177 lb 4 oz (80.4 kg)  She appears well, vital signs are as noted. Ears normal.  Throat and pharynx normal.  Neck supple. No adenopathy in the neck. Nose is congested. Sinuses non tender. The chest is clear, without wheezes or rales.  ASSESSMENT:  viral upper respiratory illness  PLAN: Rx given for doxycycline (has PCN allergy) if symptoms do not improve over next 5 days. The patient indicates understanding of these issues and agrees with the plan. Symptomatic therapy suggested: push fluids, rest and return office visit prn if symptoms persist or worsen. Lack of antibiotic effectiveness discussed with her. Call or return to clinic prn if these symptoms worsen or fail to improve as anticipated.

## 2011-07-08 ENCOUNTER — Telehealth: Payer: Self-pay | Admitting: Family Medicine

## 2011-07-08 NOTE — Telephone Encounter (Signed)
Noted thanks °

## 2011-07-08 NOTE — Telephone Encounter (Signed)
Pt called, having chest congestion, and feeling bad. Offered Sat Clinic. Says she may go to an Urgent Care. Told pt to call back if she decide on Sat Clinic.Marland KitchenMarland Kitchen

## 2011-07-11 ENCOUNTER — Telehealth: Payer: Self-pay | Admitting: *Deleted

## 2011-07-11 NOTE — Telephone Encounter (Signed)
Ok to send doxycycline 100 mg twice daily x 10 days, #20 with no refills.

## 2011-07-11 NOTE — Telephone Encounter (Signed)
Rx called in as directed. Message left notifying patient and advised if no better or worsening, she will need appt.

## 2011-07-11 NOTE — Telephone Encounter (Signed)
Patient called and she is feeling no better. She completed the doxycycline and actually feels worse than before. She went to ER and had CXR. It was negative and she was given cough med and sent home. She can't afford a zpac, but requests something on the $4 plan to help with bronchitis. (She feels certain that is what she has). She has a constant cough, congestion and body aches.   Sent to Dr. Reece Agar and Dr. Mervyn Skeeters

## 2011-07-11 NOTE — Telephone Encounter (Signed)
Noted. Thanks. 2nd doxy dose this month.  If not better or worsening, will need to come in.

## 2011-07-12 ENCOUNTER — Telehealth: Payer: Self-pay | Admitting: *Deleted

## 2011-07-12 NOTE — Telephone Encounter (Signed)
Patient went to pick up prescription and doxy is no longer on the $4 program. Is there something else she could have that is $4? She cannot afford full price meds. The current list includes:   Amoxicillin, Cephalexin,Cipro, PCN,Bactrim

## 2011-07-12 NOTE — Telephone Encounter (Signed)
I believe doxy is free at [facility] (can we call to verify?). Is there a nearby store for [place] to send it for her?  If so may do.

## 2011-07-13 MED ORDER — CEPHALEXIN 500 MG PO CAPS: 500.0000 mg | ORAL_CAPSULE | Freq: Three times a day (TID) | ORAL | Status: DC

## 2011-07-13 MED ORDER — SULFAMETHOXAZOLE-TMP DS 800-160 MG PO TABS: 1.0000 | ORAL_TABLET | Freq: Two times a day (BID) | ORAL | Status: AC

## 2011-07-13 NOTE — Telephone Encounter (Signed)
Sent in bactrim.  If not better will need to come in.

## 2011-07-13 NOTE — Telephone Encounter (Signed)
Message left notifying patient. Advised if no better after abx course, she would need to be seen.

## 2011-07-13 NOTE — Telephone Encounter (Signed)
Doxy is not on the free list at [facility]. Please advise.

## 2011-09-22 ENCOUNTER — Other Ambulatory Visit: Payer: Self-pay | Admitting: *Deleted

## 2011-09-22 MED ORDER — MELOXICAM 15 MG PO TABS: ORAL_TABLET | ORAL | Status: DC

## 2011-10-31 ENCOUNTER — Other Ambulatory Visit: Payer: Self-pay | Admitting: Family Medicine

## 2011-10-31 NOTE — Telephone Encounter (Signed)
OK to refill

## 2011-11-01 NOTE — Telephone Encounter (Signed)
Per pharmacy - rx expired, unused refills remained.  Pt needs OV for f/u. Lab Results  Component Value Date   HGBA1C 11.1* 03/16/2011

## 2011-11-01 NOTE — Telephone Encounter (Signed)
Message left notifying patient of refill and to call and schedule follow up.

## 2011-11-18 ENCOUNTER — Ambulatory Visit (INDEPENDENT_AMBULATORY_CARE_PROVIDER_SITE_OTHER): Payer: PRIVATE HEALTH INSURANCE | Admitting: *Deleted

## 2011-11-18 DIAGNOSIS — Z0279 Encounter for issue of other medical certificate: Secondary | ICD-10-CM

## 2011-11-21 ENCOUNTER — Ambulatory Visit (INDEPENDENT_AMBULATORY_CARE_PROVIDER_SITE_OTHER): Payer: PRIVATE HEALTH INSURANCE | Admitting: Family Medicine

## 2011-11-21 ENCOUNTER — Encounter: Payer: Self-pay | Admitting: Family Medicine

## 2011-11-21 VITALS — BP 124/80 | HR 92 | Temp 98.0°F | Ht 61.75 in | Wt 176.5 lb

## 2011-11-21 DIAGNOSIS — E1149 Type 2 diabetes mellitus with other diabetic neurological complication: Secondary | ICD-10-CM

## 2011-11-21 DIAGNOSIS — E785 Hyperlipidemia, unspecified: Secondary | ICD-10-CM

## 2011-11-21 DIAGNOSIS — M5416 Radiculopathy, lumbar region: Secondary | ICD-10-CM

## 2011-11-21 DIAGNOSIS — IMO0002 Reserved for concepts with insufficient information to code with codable children: Secondary | ICD-10-CM

## 2011-11-21 DIAGNOSIS — E1140 Type 2 diabetes mellitus with diabetic neuropathy, unspecified: Secondary | ICD-10-CM

## 2011-11-21 DIAGNOSIS — I10 Essential (primary) hypertension: Secondary | ICD-10-CM

## 2011-11-21 DIAGNOSIS — E1142 Type 2 diabetes mellitus with diabetic polyneuropathy: Secondary | ICD-10-CM

## 2011-11-21 LAB — TB SKIN TEST: TB Skin Test: NEGATIVE

## 2011-11-21 NOTE — Patient Instructions (Signed)
Split lantus.  Increase by 1 unit twice a day (ie 51 units) to goal of 55 units twice daily if 3 day average of sugars staying consistently >200. Return to see me in 3 months, keep a good log 1 week prior to coming in. good to see you today, call us with questions.

## 2011-11-21 NOTE — Assessment & Plan Note (Signed)
Chronic.  Check d LDL today. Continue statin. Lab Results  Component Value Date   CHOL  Value: 431        ATP III CLASSIFICATION:  <200     mg/dL   Desirable  161-096  mg/dL   Borderline High  >=045    mg/dL   High       * 08/22/8117   HDL NOT REPORTED DUE TO HIGH TRIGLYCERIDES 10/30/2009   LDLCALC UNABLE TO CALCULATE IF TRIGLYCERIDE OVER 400 mg/dL 1/47/8295   TRIG 6213 RESULTS CONFIRMED BY MANUAL DILUTION* 10/30/2009   CHOLHDL NOT REPORTED DUE TO HIGH TRIGLYCERIDES 10/30/2009

## 2011-11-21 NOTE — Assessment & Plan Note (Signed)
Improved. Continue to monitor. 

## 2011-11-21 NOTE — Assessment & Plan Note (Signed)
Stable on current meds. Continue.  Chronic.

## 2011-11-21 NOTE — Progress Notes (Signed)
  Subjective:    Patient ID: Karen [name], female    DOB: Oct 09, 1955, 56 y.o.   MRN: [id number]  HPI CC: f/u DM  Back pain much better.  Off norco.  Gabapentin 300mg  caused too much sedation so stopped.    DM - sugars running 200-300 occasionally 400s.  No low sugars, no hypoglycemia sxs.  Vision screen - unsure, several years.  Trouble affording currently.  Continued paresthesias, not bothersome. Lab Results  Component Value Date   HGBA1C 11.1* 03/16/2011   HTN - well controlled.  No HA, vision changes, CP/tightness, SOB, leg swelling.    HLD - tolerating statin.  No myalgias.  Past Medical History  Diagnosis Date  . History of chicken pox   . Depression   . Diabetes mellitus     dx 1993  . Chronic bronchitis   . Syncopal episodes   . GERD (gastroesophageal reflux disease)   . Seasonal allergies   . High blood pressure   . HLD (hyperlipidemia)   . Seizure 1977    off AEDs  . History of ovarian cyst   . Tubal pregnancy   . Tachyarrhythmia     ER visits with adenosine push to control     Review of Systems Per HPI    Objective:   Physical Exam  Nursing note and vitals reviewed. Constitutional: She appears well-developed and well-nourished. No distress.  HENT:  Head: Normocephalic and atraumatic.  Right Ear: External ear normal.  Left Ear: External ear normal.  Nose: Nose normal.  Mouth/Throat: Oropharynx is clear and moist. No oropharyngeal exudate.  Eyes: Conjunctivae and EOM are normal. Pupils are equal, round, and reactive to light. No scleral icterus.  Neck: Normal range of motion. Neck supple. Carotid bruit is not present.  Cardiovascular: Normal rate, regular rhythm, normal heart sounds and intact distal pulses.   No murmur heard. Pulmonary/Chest: Effort normal and breath sounds normal. No respiratory distress. She has no wheezes. She has no rales.  Musculoskeletal: She exhibits no edema.       Diabetic foot exam: Normal inspection No skin breakdown No  calluses  Normal DP/PT pulses Normal sensation to light touch and decreased throughout to monofilament Nails normal  Lymphadenopathy:    She has no cervical adenopathy.  Skin: Skin is warm and dry. No rash noted.  Psychiatric: She has a normal mood and affect.       Assessment & Plan:

## 2011-11-21 NOTE — Assessment & Plan Note (Signed)
Chronic, uncontrolled.  Not seen since 03/2011. Reports compliance with meds. Did not bring log today. Discussed slow titration of lantus up to goal 110 u daily and split dosing. Due for vision screen but unable to afford. Asked to return in 3 mo for f/u. Unable to afford other meds.

## 2011-11-25 ENCOUNTER — Encounter: Payer: Self-pay | Admitting: *Deleted

## 2011-12-13 ENCOUNTER — Telehealth: Payer: Self-pay | Admitting: Family Medicine

## 2011-12-13 NOTE — Telephone Encounter (Signed)
received [employer] assistance forms.  Signed and routed to kim.

## 2011-12-14 NOTE — Telephone Encounter (Signed)
Forms needed financial info from patient. Patient notified and will come by and sign form and drop off needed info. Unable to complete forms until patient comes in. Will await patient.

## 2011-12-27 ENCOUNTER — Other Ambulatory Visit: Payer: Self-pay | Admitting: Family Medicine

## 2011-12-27 NOTE — Telephone Encounter (Signed)
Unfilled scripts.  ?noncompliance.

## 2012-01-20 ENCOUNTER — Telehealth: Payer: Self-pay

## 2012-01-20 NOTE — Telephone Encounter (Signed)
Pt had 2 step PPD last year. Pt said employer was requiring again this year. Advised pt to get form on in writing from employer that a second 2 step PPD was required. Pt will ck with employer and call back.

## 2012-01-25 ENCOUNTER — Other Ambulatory Visit: Payer: Self-pay | Admitting: Family Medicine

## 2012-02-03 NOTE — Telephone Encounter (Signed)
Forms completed and faxed on 01/25/12. (That was when patient was able to come in to complete forms and bring needed information). Still awaiting approval.

## 2012-02-12 ENCOUNTER — Encounter (HOSPITAL_COMMUNITY): Payer: Self-pay | Admitting: Emergency Medicine

## 2012-02-12 DIAGNOSIS — I10 Essential (primary) hypertension: Secondary | ICD-10-CM | POA: Insufficient documentation

## 2012-02-12 DIAGNOSIS — Z880 Allergy status to penicillin: Secondary | ICD-10-CM | POA: Insufficient documentation

## 2012-02-12 DIAGNOSIS — K219 Gastro-esophageal reflux disease without esophagitis: Secondary | ICD-10-CM | POA: Insufficient documentation

## 2012-02-12 DIAGNOSIS — Z794 Long term (current) use of insulin: Secondary | ICD-10-CM | POA: Insufficient documentation

## 2012-02-12 DIAGNOSIS — E1149 Type 2 diabetes mellitus with other diabetic neurological complication: Secondary | ICD-10-CM | POA: Insufficient documentation

## 2012-02-12 DIAGNOSIS — Z841 Family history of disorders of kidney and ureter: Secondary | ICD-10-CM | POA: Insufficient documentation

## 2012-02-12 DIAGNOSIS — Z8249 Family history of ischemic heart disease and other diseases of the circulatory system: Secondary | ICD-10-CM | POA: Insufficient documentation

## 2012-02-12 DIAGNOSIS — Z823 Family history of stroke: Secondary | ICD-10-CM | POA: Insufficient documentation

## 2012-02-12 DIAGNOSIS — M549 Dorsalgia, unspecified: Secondary | ICD-10-CM | POA: Insufficient documentation

## 2012-02-12 DIAGNOSIS — Z833 Family history of diabetes mellitus: Secondary | ICD-10-CM | POA: Insufficient documentation

## 2012-02-12 DIAGNOSIS — E1142 Type 2 diabetes mellitus with diabetic polyneuropathy: Secondary | ICD-10-CM | POA: Insufficient documentation

## 2012-02-12 DIAGNOSIS — Z881 Allergy status to other antibiotic agents status: Secondary | ICD-10-CM | POA: Insufficient documentation

## 2012-02-12 DIAGNOSIS — F3289 Other specified depressive episodes: Secondary | ICD-10-CM | POA: Insufficient documentation

## 2012-02-12 DIAGNOSIS — Z8489 Family history of other specified conditions: Secondary | ICD-10-CM | POA: Insufficient documentation

## 2012-02-12 DIAGNOSIS — Z809 Family history of malignant neoplasm, unspecified: Secondary | ICD-10-CM | POA: Insufficient documentation

## 2012-02-12 DIAGNOSIS — F329 Major depressive disorder, single episode, unspecified: Secondary | ICD-10-CM | POA: Insufficient documentation

## 2012-02-12 NOTE — ED Notes (Signed)
Patient complaining of right lower back pain that radiates down her right leg; patient reports that pain worsens upon sitting or lying down -- patient receives pain relief when she ambulates.  Patient reports that she has a history of sciatica; denies any recent injury to back or leg.

## 2012-02-13 ENCOUNTER — Emergency Department (HOSPITAL_COMMUNITY)
Admission: EM | Admit: 2012-02-13 | Discharge: 2012-02-13 | Disposition: A | Discharge status: Home / Self Care | Payer: PRIVATE HEALTH INSURANCE | Attending: Emergency Medicine | Admitting: Emergency Medicine

## 2012-02-13 DIAGNOSIS — M549 Dorsalgia, unspecified: Secondary | ICD-10-CM

## 2012-02-13 MED ORDER — HYDROCODONE-ACETAMINOPHEN 5-325 MG PO TABS: 1.0000 | ORAL_TABLET | ORAL | Status: DC | PRN

## 2012-02-13 MED ORDER — METHOCARBAMOL 500 MG PO TABS: 500.0000 mg | ORAL_TABLET | Freq: Two times a day (BID) | ORAL | Status: DC

## 2012-02-13 NOTE — ED Provider Notes (Signed)
History     CSN: [id number]  Arrival date & time 02/12/12  2340   First MD Initiated Contact with Patient 02/13/12 0055      Chief Complaint  Patient presents with  . Back Pain   HPI  History provided by the patient. Patient is a 56 year old female with history of diabetes, hypertension, hyperlipidemia who presents with complaints of low back pain that radiates into right buttocks and thigh area. Patient states symptoms have been progressing over the past one to 2 weeks. She states symptoms seem to improve when she is standing and moving around but worse with movements from sitting and lying flat on her back. Patient describes pain as a burning and sometimes electrical type pain. Patient does report having similar symptoms over one year ago which seemed to resolve on their own. She does not recall what her diagnosis was at that time.  No recent strenuous activity and no injury or trauma to her back.. She denies any dysuria, hematuria, urinary frequency or flank pain.    Past Medical History  Diagnosis Date  . History of chicken pox   . Depression   . Type 2 diabetes, uncontrolled, with neuropathy     dx 1993  . Chronic bronchitis   . Syncopal episodes   . GERD (gastroesophageal reflux disease)   . Seasonal allergies   . High blood pressure   . HLD (hyperlipidemia)   . Seizure 1977    off AEDs  . History of ovarian cyst   . Tubal pregnancy   . Tachyarrhythmia     ER visits with adenosine push to control    Past Surgical History  Procedure Date  . Tonsillectomy and adenoidectomy 1965  . Ovarian cyst surgery 1998  . Dilation and curettage of uterus 1984  . Ectopic pregnancy surgery 1984    R tube removal 2/2 hemorrhage  . Rotator cuff repair 2006    Right    Family History  Problem Relation Age of Onset  . Diabetes Mother   . Hyperlipidemia Mother   . Hypertension Mother   . Stroke Mother   . Coronary artery disease Mother   . Heart disease Mother   . Diabetes  Father   . Hypertension Father   . Hyperlipidemia Father   . Coronary artery disease Father   . Heart disease Father   . Prostate cancer Father   . Pancreatic cancer Father   . Heart disease Brother   . Diabetes Maternal Grandmother   . Diabetes Maternal Grandfather   . Diabetes Paternal Grandmother   . Diabetes Paternal Grandfather   . Kidney disease Neg Hx   . Cancer Neg Hx     History  Substance Use Topics  . Smoking status: Never Smoker   . Smokeless tobacco: Never Used  . Alcohol Use: No    OB History    Grav Para Term Preterm Abortions TAB SAB Ect Mult Living                  Review of Systems  Constitutional: Negative for fever, chills and unexpected weight change.  Respiratory: Negative for shortness of breath.   Cardiovascular: Negative for chest pain.  Genitourinary: Negative for dysuria, frequency, hematuria and flank pain.  Musculoskeletal: Positive for back pain.  Neurological: Negative for weakness and numbness.    Allergies  Erythromycin and Penicillins  Home Medications   Current Outpatient Rx  Name Route Sig Dispense Refill  . ATORVASTATIN CALCIUM 40 MG PO TABS  Oral Take 40 mg by mouth at bedtime.      Marland Kitchen CETIRIZINE HCL 10 MG PO TABS Oral Take 10 mg by mouth daily as needed.      Marland Kitchen VITAMIN D3 2000 UNITS PO TABS Oral Take 1 tablet by mouth daily.      Marland Kitchen CITALOPRAM HYDROBROMIDE 40 MG PO TABS Oral Take 40 mg by mouth daily.    . CYCLOBENZAPRINE HCL 10 MG PO TABS Oral Take 10 mg by mouth 2 (two) times daily as needed. For muscle spasms    . INSULIN GLARGINE 100 UNIT/ML [place] SOLN Subcutaneous Inject 50-60 Units into the skin See admin instructions. Takes 50 units in the morning and 60 units at bedtime    . INSULIN REGULAR HUMAN 100 UNIT/ML IJ SOLN Subcutaneous Inject 20 Units into the skin 3 (three) times daily before meals.    Marland Kitchen LISINOPRIL 10 MG PO TABS Oral Take 1 tablet (10 mg total) by mouth daily. 90 tablet 3  . MELOXICAM 15 MG PO TABS Oral Take 15  mg by mouth daily as needed. For pain    . METFORMIN HCL 1000 MG PO TABS Oral Take 1,000 mg by mouth 2 (two) times daily with a meal.    . METOPROLOL TARTRATE 25 MG PO TABS Oral Take 12.5 mg by mouth 2 (two) times daily.    Marland Kitchen OMEPRAZOLE 20 MG PO CPDR Oral Take 20 mg by mouth daily.    Marland Kitchen VITAMIN E 400 UNITS PO CAPS Oral Take 400 Units by mouth daily.        BP 148/74  Pulse 99  Temp 97.7 F (36.5 C) (Oral)  Resp 18  SpO2 98%  Physical Exam  Nursing note and vitals reviewed. Constitutional: She is oriented to person, place, and time. She appears well-developed and well-nourished. No distress.  HENT:  Head: Normocephalic and atraumatic.  Neck: Normal range of motion. Neck supple.  Cardiovascular: Normal rate and regular rhythm.   No murmur heard. Pulmonary/Chest: Effort normal and breath sounds normal.  Abdominal: Soft. There is no tenderness. There is no rebound and no guarding.       No CVA tenderness  Musculoskeletal: She exhibits no edema and no tenderness.       Cervical back: Normal.       Thoracic back: Normal.       Lumbar back: She exhibits tenderness.       Back:       Negative straight leg test bilaterally.  Neurological: She is alert and oriented to person, place, and time.  Skin: Skin is warm and dry. No rash noted.  Psychiatric: She has a normal mood and affect. Her behavior is normal.    ED Course  Procedures     1. Back pain       MDM  1:30AM patient seen and evaluated. Patient resting comfortably in bed in no acute distress. No concerning a red flag symptoms of back pain today. No indications for imaging. At this time will treat symptomatically and have patient followup with PCP. She agrees with this plan.         Angus Seller, [place] 02/13/12 [phone]

## 2012-02-13 NOTE — ED Notes (Signed)
Pt has been having pain radiating from her R hip to her knee. Pt states that it is a burning feeling. Pain onset 3 days ago. Has not been able to sleep at night. Pt rates pain 8/10.

## 2012-02-13 NOTE — ED Provider Notes (Signed)
Medical screening examination/treatment/procedure(s) were performed by non-physician practitioner and as supervising physician I was immediately available for consultation/collaboration.   Joya Gaskins, MD 02/13/12 [phone]

## 2012-02-14 NOTE — Telephone Encounter (Signed)
Called [employer] to check on status- they are still awaiting approval from patient assistance dept.

## 2012-02-20 ENCOUNTER — Telehealth: Payer: Self-pay | Admitting: *Deleted

## 2012-02-20 NOTE — Telephone Encounter (Signed)
Ok to do levemir while she gets set up for patient assitance for lantus - levemir 50 u in am and 60 u in pm.  May provide samples.  Thanks.

## 2012-02-20 NOTE — Telephone Encounter (Signed)
Patient called and said she is out of Lantus and requested samples. We do not have any here and she is still waiting to hear back from the Patient Assistance program. (I called them last week and they said we should hopefully know something this week) I offered to call some in for her, but she can't afford it. Can she substitute something else for right now? We have samples of Levemir. She said she has used that before.

## 2012-02-22 ENCOUNTER — Other Ambulatory Visit: Payer: Self-pay | Admitting: *Deleted

## 2012-02-22 MED ORDER — METHOCARBAMOL 500 MG PO TABS: 500.0000 mg | ORAL_TABLET | Freq: Two times a day (BID) | ORAL | Status: DC

## 2012-02-22 MED ORDER — HYDROCODONE-ACETAMINOPHEN 5-325 MG PO TABS
1.0000 | ORAL_TABLET | Freq: Four times a day (QID) | ORAL | Status: DC | PRN
Start: 2012-02-22 — End: 2012-08-22

## 2012-02-22 NOTE — Telephone Encounter (Signed)
Rx called in as directed. Message left advising patient to use ibuprofen prior to hydrocodone. Also advised OV needed prior to more refills.

## 2012-02-22 NOTE — Telephone Encounter (Signed)
plz phone in hydrocodone. i want her to use ibuprofen first prior to hydrocodone for pain. If continued pain, to come in for OV prior to more refills.

## 2012-02-22 NOTE — Telephone Encounter (Signed)
Patient was seen in ER for back pain similar to what she was having before (radiating into hip and leg) and asks for refill on meds she was given there. She is only taking them at night when she is not at work. Please advise.

## 2012-02-22 NOTE — Telephone Encounter (Signed)
Message left notifying patient. Advised samples would be ready for her when she can come for pick up.

## 2012-02-22 NOTE — Telephone Encounter (Signed)
Patient approved and message left notifying patient. Advised to call and advise if she wants to wait on Lantus or if she wants to go ahead and pick up Levemir until Lantus arrives.

## 2012-02-29 ENCOUNTER — Ambulatory Visit: Payer: PRIVATE HEALTH INSURANCE | Admitting: Family Medicine

## 2012-06-18 ENCOUNTER — Telehealth: Payer: Self-pay

## 2012-06-18 NOTE — Telephone Encounter (Signed)
Pt request copy of letter with TB skin test results 11/21/11. Pt advised can pick up at front desk.

## 2012-07-04 ENCOUNTER — Other Ambulatory Visit: Payer: Self-pay | Admitting: Family Medicine

## 2012-08-22 ENCOUNTER — Ambulatory Visit (INDEPENDENT_AMBULATORY_CARE_PROVIDER_SITE_OTHER): Payer: Self-pay | Admitting: Family Medicine

## 2012-08-22 ENCOUNTER — Encounter: Payer: Self-pay | Admitting: *Deleted

## 2012-08-22 ENCOUNTER — Encounter: Payer: Self-pay | Admitting: Family Medicine

## 2012-08-22 VITALS — BP 128/80 | HR 76 | Temp 98.0°F | Wt 182.0 lb

## 2012-08-22 DIAGNOSIS — E1142 Type 2 diabetes mellitus with diabetic polyneuropathy: Secondary | ICD-10-CM

## 2012-08-22 DIAGNOSIS — K0889 Other specified disorders of teeth and supporting structures: Secondary | ICD-10-CM | POA: Insufficient documentation

## 2012-08-22 DIAGNOSIS — E1140 Type 2 diabetes mellitus with diabetic neuropathy, unspecified: Secondary | ICD-10-CM

## 2012-08-22 DIAGNOSIS — K089 Disorder of teeth and supporting structures, unspecified: Secondary | ICD-10-CM

## 2012-08-22 DIAGNOSIS — E1149 Type 2 diabetes mellitus with other diabetic neurological complication: Secondary | ICD-10-CM

## 2012-08-22 MED ORDER — IBUPROFEN 800 MG PO TABS: 800.0000 mg | ORAL_TABLET | Freq: Three times a day (TID) | ORAL | Status: DC | PRN

## 2012-08-22 MED ORDER — DOXYCYCLINE MONOHYDRATE 100 MG PO CAPS: 100.0000 mg | ORAL_CAPSULE | Freq: Two times a day (BID) | ORAL | Status: DC

## 2012-08-22 NOTE — Patient Instructions (Signed)
Treat with doxycyline twice daily for 10 days. I've also sent in ibuprofen 800mg  to [facility]. Let us know if not improving as expected. Return in 1 month for diabetes follow up. Good to see you today, call us with questions.

## 2012-08-22 NOTE — Progress Notes (Signed)
  Subjective:    Patient ID: Karen [name], female    DOB: 05-14-1956, 57 y.o.   MRN: [id number]  HPI CC: toothache  Pleasant 57 yo pt of mine with no insurance and h/o nonadherence 2/2 this, with h/o uncontrolled T2DM with neuropathy, GERD, HTN, HLD, h/o seizures last seen here 11/2011 presents with 3d h/o right sided tooth ache, right swollen gland in neck.  Pain leads to nausea.  Fatigued.  Controlling pain/swelling with 800mg  ibuprofen bid.  tylenol doesn't help.  Poor dentition at baseline.  No fevers/chills.  Denies dysphagia or swelling in back of throat. Out of work yesterday and today.  Requests abx on generic list at [facility] which is free - only options are bactrim and keflex as doxy no longer on that last (and she states cannot afford otherwise) and given PCN allergy.  Insurance to take effect 09/2012.  States will return in May to discuss chronic medical conditions.  Planning on getting dental insurance.  Has not seen dentist in a long time. Lab Results  Component Value Date   HGBA1C 13.0* 11/21/2011    DM - sugars running high - 200s.  Takes lantus 60units bid.  Metformin 1000mg  bid.  States cannot afford blood work today, requests further discussion at next month's appt which she states she will keep. Never smoker.  Past Medical History  Diagnosis Date  . History of chicken pox   . Depression   . Type 2 diabetes, uncontrolled, with neuropathy     dx 1993  . Chronic bronchitis   . Syncopal episodes   . GERD (gastroesophageal reflux disease)   . Seasonal allergies   . High blood pressure   . HLD (hyperlipidemia)   . Seizure 1977    off AEDs  . History of ovarian cyst   . Tubal pregnancy   . Tachyarrhythmia     ER visits with adenosine push to control     Review of Systems Per HPI    Objective:   Physical Exam  Nursing note and vitals reviewed. Constitutional: She appears well-developed and well-nourished. No distress.  HENT:  Head: Normocephalic and  atraumatic.  Mouth/Throat: Oropharynx is clear and moist. Abnormal dentition. Dental caries present. No oropharyngeal exudate.    Poor dentition througout, several missing teeth, broken teeth. Maximally tender to palpation one of lower right premolars which is broken. Also tender to palpation along right lower jawline  Eyes: Conjunctivae and EOM are normal. Pupils are equal, round, and reactive to light.  Neck: Normal range of motion.  Cardiovascular: Normal rate, regular rhythm, normal heart sounds and intact distal pulses.   No murmur heard. Pulmonary/Chest: Effort normal and breath sounds normal. No respiratory distress. She has no wheezes. She has no rales.  Lymphadenopathy:    She has cervical adenopathy (R AC LAD).  Skin: Skin is warm and dry. No rash noted.       Assessment & Plan:

## 2012-08-22 NOTE — Assessment & Plan Note (Signed)
In setting of poor dentition, peridontitis, and poorly controlled diabetes. No evidence of abscess today, however will cover with doxycycline course as well as ibuprofen for pain/swelling. I encouraged close f/u with dentist. Financial concerns - Selena Batten is an acquaintance of pt, will assist with cost of med. No charge today.

## 2012-08-22 NOTE — Assessment & Plan Note (Signed)
I did ask her to keep appt next month for diabetes f/u once she has insurance again.

## 2012-08-23 ENCOUNTER — Encounter: Payer: Self-pay | Admitting: *Deleted

## 2012-08-23 ENCOUNTER — Telehealth: Payer: Self-pay | Admitting: *Deleted

## 2012-08-23 MED ORDER — HYDROCODONE-ACETAMINOPHEN 5-325 MG PO TABS: 1.0000 | ORAL_TABLET | Freq: Four times a day (QID) | ORAL | Status: DC | PRN

## 2012-08-23 NOTE — Telephone Encounter (Signed)
Patient notified. She said she would like a very cheap, stronger pain med if possible sent to [facility].

## 2012-08-23 NOTE — Telephone Encounter (Signed)
Ok to extend work excuse. To let us know if desires stronger pain medication.

## 2012-08-23 NOTE — Telephone Encounter (Signed)
plz phone in hydrocodone 

## 2012-08-23 NOTE — Telephone Encounter (Signed)
Patient called first thing this AM and said she is still in a great deal of pain and didn't go to work again. She was asking if her work note could be extended through today. She has added tylenol along with the ibuprofen to help with pain control. She said her face is still swollen.

## 2012-08-23 NOTE — Telephone Encounter (Signed)
Rx called in as requested

## 2012-09-13 ENCOUNTER — Telehealth: Payer: Self-pay | Admitting: *Deleted

## 2012-09-13 NOTE — Telephone Encounter (Signed)
Patient called and said she is feeling gassy, bloated and getting full sooner than usual. Her last BM was Monday and she doesn't have the urge to go at all. She has been taking Miralax once daily with no results. Any suggestions?

## 2012-09-13 NOTE — Telephone Encounter (Signed)
Can inc miralax to bid and see if that helps.  If no flatus at all or inc in abd pain in the meantime, then needs to be seen.

## 2012-09-13 NOTE — Telephone Encounter (Signed)
Patient advised.

## 2012-10-30 ENCOUNTER — Other Ambulatory Visit: Payer: Self-pay | Admitting: Family Medicine

## 2012-11-20 ENCOUNTER — Encounter: Payer: Self-pay | Admitting: *Deleted

## 2012-11-21 ENCOUNTER — Emergency Department (HOSPITAL_COMMUNITY)
Admission: EM | Admit: 2012-11-21 | Discharge: 2012-11-21 | Disposition: A | Discharge status: Home / Self Care | Payer: BC Managed Care – PPO | Attending: Emergency Medicine | Admitting: Emergency Medicine

## 2012-11-21 ENCOUNTER — Encounter (HOSPITAL_COMMUNITY): Payer: Self-pay | Admitting: Emergency Medicine

## 2012-11-21 ENCOUNTER — Ambulatory Visit (INDEPENDENT_AMBULATORY_CARE_PROVIDER_SITE_OTHER): Payer: BC Managed Care – PPO | Admitting: Family Medicine

## 2012-11-21 ENCOUNTER — Encounter: Payer: Self-pay | Admitting: *Deleted

## 2012-11-21 ENCOUNTER — Encounter: Payer: Self-pay | Admitting: Family Medicine

## 2012-11-21 VITALS — BP 120/76 | HR 80 | Temp 98.1°F | Wt 176.5 lb

## 2012-11-21 DIAGNOSIS — E1140 Type 2 diabetes mellitus with diabetic neuropathy, unspecified: Secondary | ICD-10-CM

## 2012-11-21 DIAGNOSIS — A084 Viral intestinal infection, unspecified: Secondary | ICD-10-CM | POA: Insufficient documentation

## 2012-11-21 DIAGNOSIS — E1142 Type 2 diabetes mellitus with diabetic polyneuropathy: Secondary | ICD-10-CM | POA: Insufficient documentation

## 2012-11-21 DIAGNOSIS — Z8669 Personal history of other diseases of the nervous system and sense organs: Secondary | ICD-10-CM | POA: Insufficient documentation

## 2012-11-21 DIAGNOSIS — I498 Other specified cardiac arrhythmias: Secondary | ICD-10-CM | POA: Insufficient documentation

## 2012-11-21 DIAGNOSIS — Z794 Long term (current) use of insulin: Secondary | ICD-10-CM | POA: Insufficient documentation

## 2012-11-21 DIAGNOSIS — Z8709 Personal history of other diseases of the respiratory system: Secondary | ICD-10-CM | POA: Insufficient documentation

## 2012-11-21 DIAGNOSIS — I10 Essential (primary) hypertension: Secondary | ICD-10-CM | POA: Insufficient documentation

## 2012-11-21 DIAGNOSIS — I471 Supraventricular tachycardia: Secondary | ICD-10-CM

## 2012-11-21 DIAGNOSIS — F3289 Other specified depressive episodes: Secondary | ICD-10-CM | POA: Insufficient documentation

## 2012-11-21 DIAGNOSIS — E1149 Type 2 diabetes mellitus with other diabetic neurological complication: Secondary | ICD-10-CM

## 2012-11-21 DIAGNOSIS — Z8742 Personal history of other diseases of the female genital tract: Secondary | ICD-10-CM | POA: Insufficient documentation

## 2012-11-21 DIAGNOSIS — K219 Gastro-esophageal reflux disease without esophagitis: Secondary | ICD-10-CM | POA: Insufficient documentation

## 2012-11-21 DIAGNOSIS — F329 Major depressive disorder, single episode, unspecified: Secondary | ICD-10-CM | POA: Insufficient documentation

## 2012-11-21 DIAGNOSIS — A088 Other specified intestinal infections: Secondary | ICD-10-CM

## 2012-11-21 DIAGNOSIS — E785 Hyperlipidemia, unspecified: Secondary | ICD-10-CM | POA: Insufficient documentation

## 2012-11-21 DIAGNOSIS — Z8619 Personal history of other infectious and parasitic diseases: Secondary | ICD-10-CM | POA: Insufficient documentation

## 2012-11-21 DIAGNOSIS — Z79899 Other long term (current) drug therapy: Secondary | ICD-10-CM | POA: Insufficient documentation

## 2012-11-21 MED ORDER — MECLIZINE HCL 25 MG PO TABS: 25.0000 mg | ORAL_TABLET | Freq: Three times a day (TID) | ORAL | Status: DC | PRN

## 2012-11-21 MED ORDER — METFORMIN HCL 1000 MG PO TABS: ORAL_TABLET | ORAL | Status: DC

## 2012-11-21 NOTE — Assessment & Plan Note (Signed)
Chronically uncontrolled. Has shown noncompliance with f/u plans. I encouraged her to return next 1-2 mo for physical and DM f/u. May recommend endo referral for chronically poorly controlled DM.

## 2012-11-21 NOTE — ED Notes (Signed)
As per EMS pt was in SVT 192/mt

## 2012-11-21 NOTE — Patient Instructions (Addendum)
Continue insulin. I've sent in metformin.   If persistent nausea and diarrhea, please let me know sooner. Otherwise return to see me in 1-2 months for a physical and to discuss diabetes - prior fasting for blood work. Meclizine prescription provided today. Work note provided today.

## 2012-11-21 NOTE — Assessment & Plan Note (Signed)
Anticipate acute gastroenteritis given recent sick contacts at work. Note for work provided. Seems to be in resolving stage. supportive care discussed. Seems well hydrated on exam today.

## 2012-11-21 NOTE — Progress Notes (Signed)
Subjective:    Patient ID: Karen [name], female    DOB: 08-02-1955, 57 y.o.   MRN: [id number]  HPI CC: nasty bug  Sick 4d ago - achey, low grade fever to 100.1, then 2d ago experienced nausea/vomiting, diarrhea, dizziness.  Vomiting clear food.  NBNB.  Diarrhea described as loose then watery, has improved today.  Self treated with immodium.  Some persistent nausea.  Voiding well now.  No significant abd pain.  + sick contacts at work - works at [employer] - sick ppt at work - with many staff members. No recent travel, no new foods or restaurants.  Out of work Monday and Tuesday.  Requests note.  Has changed diet - light soup and crackers.  Checking sugars sporadically - last a few days ago, 300.  Endorses compliance with lantus 60u am and 50u pm, metformin 1000mg  bid, and novolin R 20u with meals  Lab Results  Component Value Date   CREATININE 0.45* 03/12/2011   Lab Results  Component Value Date   HGBA1C 13.0* 11/21/2011    Past Medical History  Diagnosis Date  . History of chicken pox   . Depression   . Type 2 diabetes, uncontrolled, with neuropathy     dx 1993  . Chronic bronchitis   . Syncopal episodes   . GERD (gastroesophageal reflux disease)   . Seasonal allergies   . High blood pressure   . HLD (hyperlipidemia)   . Seizure 1977    off AEDs  . History of ovarian cyst   . Tubal pregnancy   . Tachyarrhythmia     ER visits with adenosine push to control    Current Outpatient Prescriptions on File Prior to Visit  Medication Sig Dispense Refill  . atorvastatin (LIPITOR) 40 MG tablet Take 40 mg by mouth at bedtime.        . cetirizine (ZYRTEC) 10 MG tablet Take 10 mg by mouth daily as needed.        . Cholecalciferol (VITAMIN D3) 2000 UNITS TABS Take 1 tablet by mouth daily.        . citalopram (CELEXA) 40 MG tablet Take 40 mg by mouth daily.      Marland Kitchen ibuprofen (ADVIL,MOTRIN) 800 MG tablet Take 1 tablet (800 mg total) by mouth every 8 (eight) hours as needed for  pain.  30 tablet  0  . insulin glargine (LANTUS) 100 UNIT/ML injection Inject 50-60 Units into the skin See admin instructions. Takes 50 units in the morning and 60 units at bedtime      . insulin regular (NOVOLIN R,HUMULIN R) 100 units/mL injection Inject 20 Units into the skin 3 (three) times daily before meals.      Marland Kitchen lisinopril (PRINIVIL,ZESTRIL) 10 MG tablet Take 1 tablet (10 mg total) by mouth daily.  90 tablet  3  . metFORMIN (GLUCOPHAGE) 1000 MG tablet Take 1 tablet by mouth twice daily with meals. **Must have follow up for further refills**  60 tablet  0  . metoprolol tartrate (LOPRESSOR) 25 MG tablet Take 12.5 mg by mouth 2 (two) times daily.      Marland Kitchen omeprazole (PRILOSEC) 20 MG capsule Take 20 mg by mouth daily.      . vitamin E 400 UNIT capsule Take 400 Units by mouth daily.        Marland Kitchen HYDROcodone-acetaminophen (NORCO/VICODIN) 5-325 MG per tablet Take 1 tablet by mouth every 6 (six) hours as needed for pain.  20 tablet  0   No current  facility-administered medications on file prior to visit.   Review of Systems Per HPI    Objective:   Physical Exam  Nursing note and vitals reviewed. Constitutional: She appears well-developed and well-nourished. No distress.  HENT:  Mouth/Throat: Oropharynx is clear and moist. Normal dentition. No oropharyngeal exudate.  Poor dentition throughout especially lower teeth  Cardiovascular: Normal rate, regular rhythm, normal heart sounds and intact distal pulses.   No murmur heard. Pulmonary/Chest: Effort normal and breath sounds normal. No respiratory distress. She has no wheezes. She has no rales.  Abdominal: Soft. Normal appearance and bowel sounds are normal. She exhibits no distension and no mass. There is no hepatosplenomegaly. There is no tenderness. There is no rigidity, no rebound, no guarding, no CVA tenderness and negative Murphy's sign.  Skin: Skin is warm and dry. No rash noted.       Assessment & Plan:

## 2012-11-21 NOTE — ED Provider Notes (Signed)
History    CSN: [id number] Arrival date & time 11/21/12  1232  First MD Initiated Contact with Patient 11/21/12 1305     Chief Complaint  Patient presents with  . Tachycardia   (Consider location/radiation/quality/duration/timing/severity/associated sxs/prior Treatment) HPI This 57 year old female has a history of SVT in the past resolved with adenosine, she was at her primary care doctors office today because recently she has had vomiting and diarrhea which is now improved and she needs a work excuse which was given by her primary care doctor, she was feeling fine and went back home when she had sudden onset of palpitations without lightheadedness just prior SVT, EMS used adenosine to convert her a from a heart rate of 190 converted her back to sinus rhythm back to 90, she now is asymptomatic again, she had no chest pain no shortness of breath no confusion no focal weakness or numbness she now feels back to baseline. Past Medical History  Diagnosis Date  . History of chicken pox   . Depression   . Type 2 diabetes, uncontrolled, with neuropathy     dx 1993  . Chronic bronchitis   . Syncopal episodes   . GERD (gastroesophageal reflux disease)   . Seasonal allergies   . High blood pressure   . HLD (hyperlipidemia)   . Seizure 1977    off AEDs  . History of ovarian cyst   . Tubal pregnancy   . Tachyarrhythmia     ER visits with adenosine push to control   Past Surgical History  Procedure Laterality Date  . Tonsillectomy and adenoidectomy  1965  . Ovarian cyst surgery  1998  . Dilation and curettage of uterus  1984  . Ectopic pregnancy surgery  1984    R tube removal 2/2 hemorrhage  . Rotator cuff repair  2006    Right   Family History  Problem Relation Age of Onset  . Diabetes Mother   . Hyperlipidemia Mother   . Hypertension Mother   . Stroke Mother   . Coronary artery disease Mother   . Heart disease Mother   . Diabetes Father   . Hypertension Father   .  Hyperlipidemia Father   . Coronary artery disease Father   . Heart disease Father   . Prostate cancer Father   . Pancreatic cancer Father   . Heart disease Brother   . Diabetes Maternal Grandmother   . Diabetes Maternal Grandfather   . Diabetes Paternal Grandmother   . Diabetes Paternal Grandfather   . Kidney disease Neg Hx   . Cancer Neg Hx    History  Substance Use Topics  . Smoking status: Never Smoker   . Smokeless tobacco: Never Used  . Alcohol Use: No   OB History   Grav Para Term Preterm Abortions TAB SAB Ect Mult Living                 Review of Systems 10 Systems reviewed and are negative for acute change except as noted in the HPI. Allergies  Erythromycin and Penicillins  Home Medications   Current Outpatient Rx  Name  Route  Sig  Dispense  Refill  . atorvastatin (LIPITOR) 40 MG tablet   Oral   Take 40 mg by mouth at bedtime.           . cetirizine (ZYRTEC) 10 MG tablet   Oral   Take 10 mg by mouth daily as needed.           Marland Kitchen  Cholecalciferol (VITAMIN D3) 2000 UNITS TABS   Oral   Take 1 tablet by mouth daily.           . citalopram (CELEXA) 40 MG tablet   Oral   Take 40 mg by mouth daily.         Marland Kitchen HYDROcodone-acetaminophen (NORCO/VICODIN) 5-325 MG per tablet   Oral   Take 1 tablet by mouth every 6 (six) hours as needed for pain.   20 tablet   0   . ibuprofen (ADVIL,MOTRIN) 800 MG tablet   Oral   Take 1 tablet (800 mg total) by mouth every 8 (eight) hours as needed for pain.   30 tablet   0   . insulin glargine (LANTUS) 100 UNIT/ML injection   Subcutaneous   Inject 50-60 Units into the skin See admin instructions. Takes 50 units in the morning and 60 units at bedtime         . insulin regular (NOVOLIN R,HUMULIN R) 100 units/mL injection   Subcutaneous   Inject 20 Units into the skin 3 (three) times daily before meals.         Marland Kitchen lisinopril (PRINIVIL,ZESTRIL) 10 MG tablet   Oral   Take 1 tablet (10 mg total) by mouth daily.    90 tablet   3   . meclizine (ANTIVERT) 25 MG tablet   Oral   Take 1 tablet (25 mg total) by mouth 3 (three) times daily as needed for dizziness or nausea.   30 tablet   0   . metFORMIN (GLUCOPHAGE) 1000 MG tablet      Take 1 tablet by mouth twice daily with meals   60 tablet   3     No refills available   . metoprolol tartrate (LOPRESSOR) 25 MG tablet   Oral   Take 12.5 mg by mouth 2 (two) times daily.         Marland Kitchen omeprazole (PRILOSEC) 20 MG capsule   Oral   Take 20 mg by mouth daily.         . vitamin E 400 UNIT capsule   Oral   Take 400 Units by mouth daily.            BP 122/73  Pulse 81  Temp(Src) 98.3 F (36.8 C) (Oral)  Resp 16  SpO2 98% Physical Exam  Nursing note and vitals reviewed. Constitutional:  Awake, alert, nontoxic appearance.  HENT:  Head: Atraumatic.  Eyes: Right eye exhibits no discharge. Left eye exhibits no discharge.  Neck: Neck supple.  Cardiovascular: Normal rate and regular rhythm.   No murmur heard. Pulmonary/Chest: Effort normal and breath sounds normal. No respiratory distress. She has no wheezes. She has no rales. She exhibits no tenderness.  Abdominal: Soft. Bowel sounds are normal. There is no tenderness. There is no rebound.  Musculoskeletal: She exhibits no edema and no tenderness.  Baseline ROM, no obvious new focal weakness.  Neurological: She is alert.  Mental status and motor strength appears baseline for patient and situation.  Skin: No rash noted.  Psychiatric: She has a normal mood and affect.    ED Course  Procedures (including critical care time) ECG: Normal sinus rhythm, ventricular rate 79, normal axis, normal intervals, nonspecific T wave abnormality with flat T waves diffusely, inverted T waves inferior leads, no ST elevation noted, no significant change noted compared to October 2011 Patient informed of clinical course, understand medical decision-making process, and agree with plan. Labs Reviewed - No data  to  display No results found. 1. SVT (supraventricular tachycardia)     MDM  I doubt any other EMC precluding discharge at this time including, but not necessarily limited to the following:ACS.  Hurman Horn, MD 11/24/12 [phone]

## 2012-11-21 NOTE — ED Notes (Signed)
Pt discharged.Vital signs stable and GCS 15 

## 2012-11-21 NOTE — ED Notes (Signed)
Pt brought to ED by EMS with SVT.As per EMS pt felt like palpitations and EKG showed SVT .6mg  adenosine given at 11 45am by EMS and Hr now is 91/mnt in SR.

## 2012-12-08 ENCOUNTER — Encounter (HOSPITAL_COMMUNITY): Payer: Self-pay | Admitting: Emergency Medicine

## 2012-12-08 ENCOUNTER — Emergency Department (HOSPITAL_COMMUNITY)
Admission: EM | Admit: 2012-12-08 | Discharge: 2012-12-08 | Disposition: A | Discharge status: Home / Self Care | Payer: BC Managed Care – PPO | Attending: Emergency Medicine | Admitting: Emergency Medicine

## 2012-12-08 DIAGNOSIS — Z8669 Personal history of other diseases of the nervous system and sense organs: Secondary | ICD-10-CM | POA: Insufficient documentation

## 2012-12-08 DIAGNOSIS — Z79899 Other long term (current) drug therapy: Secondary | ICD-10-CM | POA: Insufficient documentation

## 2012-12-08 DIAGNOSIS — R110 Nausea: Secondary | ICD-10-CM | POA: Insufficient documentation

## 2012-12-08 DIAGNOSIS — E785 Hyperlipidemia, unspecified: Secondary | ICD-10-CM | POA: Insufficient documentation

## 2012-12-08 DIAGNOSIS — Z8742 Personal history of other diseases of the female genital tract: Secondary | ICD-10-CM | POA: Insufficient documentation

## 2012-12-08 DIAGNOSIS — E1142 Type 2 diabetes mellitus with diabetic polyneuropathy: Secondary | ICD-10-CM | POA: Insufficient documentation

## 2012-12-08 DIAGNOSIS — Z7902 Long term (current) use of antithrombotics/antiplatelets: Secondary | ICD-10-CM | POA: Insufficient documentation

## 2012-12-08 DIAGNOSIS — R42 Dizziness and giddiness: Secondary | ICD-10-CM | POA: Insufficient documentation

## 2012-12-08 DIAGNOSIS — Z8679 Personal history of other diseases of the circulatory system: Secondary | ICD-10-CM | POA: Insufficient documentation

## 2012-12-08 DIAGNOSIS — Z872 Personal history of diseases of the skin and subcutaneous tissue: Secondary | ICD-10-CM | POA: Insufficient documentation

## 2012-12-08 DIAGNOSIS — E1149 Type 2 diabetes mellitus with other diabetic neurological complication: Secondary | ICD-10-CM | POA: Insufficient documentation

## 2012-12-08 DIAGNOSIS — Z8659 Personal history of other mental and behavioral disorders: Secondary | ICD-10-CM | POA: Insufficient documentation

## 2012-12-08 DIAGNOSIS — Z794 Long term (current) use of insulin: Secondary | ICD-10-CM | POA: Insufficient documentation

## 2012-12-08 DIAGNOSIS — K219 Gastro-esophageal reflux disease without esophagitis: Secondary | ICD-10-CM | POA: Insufficient documentation

## 2012-12-08 DIAGNOSIS — Z880 Allergy status to penicillin: Secondary | ICD-10-CM | POA: Insufficient documentation

## 2012-12-08 DIAGNOSIS — J42 Unspecified chronic bronchitis: Secondary | ICD-10-CM | POA: Insufficient documentation

## 2012-12-08 LAB — CBC WITH DIFFERENTIAL/PLATELET
Basophils Absolute: 0 10*3/uL (ref 0.0–0.1)
Basophils Relative: 0 % (ref 0–1)
Eosinophils Relative: 3 % (ref 0–5)
HCT: 40.9 % (ref 36.0–46.0)
Hemoglobin: 14.2 g/dL (ref 12.0–15.0)
MCHC: 34.7 g/dL (ref 30.0–36.0)
MCV: 83 fL (ref 78.0–100.0)
Monocytes Absolute: 0.4 10*3/uL (ref 0.1–1.0)
Monocytes Relative: 6 % (ref 3–12)
Neutro Abs: 2.8 10*3/uL (ref 1.7–7.7)
RDW: 13.4 % (ref 11.5–15.5)

## 2012-12-08 LAB — POCT I-STAT, CHEM 8
BUN: 17 mg/dL (ref 6–23)
Calcium, Ion: 1.18 mmol/L (ref 1.12–1.23)
Chloride: 103 mEq/L (ref 96–112)
Creatinine, Ser: 0.7 mg/dL (ref 0.50–1.10)
Glucose, Bld: 213 mg/dL — ABNORMAL HIGH (ref 70–99)
TCO2: 28 mmol/L (ref 0–100)

## 2012-12-08 MED ORDER — DIAZEPAM 5 MG PO TABS: 5.0000 mg | ORAL_TABLET | Freq: Two times a day (BID) | ORAL | Status: DC

## 2012-12-08 MED ORDER — DIAZEPAM 5 MG/ML IJ SOLN
5.0000 mg | Freq: Once | INTRAMUSCULAR | Status: AC
  Administered 2012-12-08: 5 mg via INTRAVENOUS
  Filled 2012-12-08: qty 2

## 2012-12-08 MED ORDER — PROMETHAZINE HCL 25 MG PO TABS: 25.0000 mg | ORAL_TABLET | Freq: Four times a day (QID) | ORAL | Status: DC | PRN

## 2012-12-08 MED ORDER — SODIUM CHLORIDE 0.9 % IV BOLUS (SEPSIS)
1000.0000 mL | Freq: Once | INTRAVENOUS | Status: AC
  Administered 2012-12-08: 1000 mL via INTRAVENOUS

## 2012-12-08 MED ORDER — ONDANSETRON HCL 4 MG/2ML IJ SOLN
4.0000 mg | Freq: Once | INTRAMUSCULAR | Status: AC
  Administered 2012-12-08: 4 mg via INTRAVENOUS
  Filled 2012-12-08: qty 2

## 2012-12-08 NOTE — ED Notes (Signed)
IV d/c'd; Pt getting dressed to be discharged home

## 2012-12-08 NOTE — ED Notes (Signed)
Pt c/o dizziness with nausea onset yesterday morning. Pt reports history of vertigo and tried her meclizine without relief. Last dose of meclizine was at midnight.

## 2012-12-08 NOTE — ED Provider Notes (Signed)
CSN: [id number]     Arrival date & time 12/08/12  0732 History     First MD Initiated Contact with Patient 12/08/12 [phone]     Chief Complaint  Patient presents with  . Dizziness  . Nausea   (Consider location/radiation/quality/duration/timing/severity/associated sxs/prior Treatment) HPI Comments: Patient is a 57 year old female with a past medical history of vertigo, diabetes, and hypertension who presents with a 2 week history of dizziness that became acutely worse this morning. Symptoms started gradually and progressively worsened since the onset. Patient reports a room spinning sensation that is worse upon standing and with head movement. Patient tried Meclizine at home without relief. She reports associated nausea.    Past Medical History  Diagnosis Date  . History of chicken pox   . Depression   . Type 2 diabetes, uncontrolled, with neuropathy     dx 1993  . Chronic bronchitis   . Syncopal episodes   . GERD (gastroesophageal reflux disease)   . Seasonal allergies   . High blood pressure   . HLD (hyperlipidemia)   . Seizure 1977    off AEDs  . History of ovarian cyst   . Tubal pregnancy   . Tachyarrhythmia     ER visits with adenosine push to control   Past Surgical History  Procedure Laterality Date  . Tonsillectomy and adenoidectomy  1965  . Ovarian cyst surgery  1998  . Dilation and curettage of uterus  1984  . Ectopic pregnancy surgery  1984    R tube removal 2/2 hemorrhage  . Rotator cuff repair  2006    Right   Family History  Problem Relation Age of Onset  . Diabetes Mother   . Hyperlipidemia Mother   . Hypertension Mother   . Stroke Mother   . Coronary artery disease Mother   . Heart disease Mother   . Diabetes Father   . Hypertension Father   . Hyperlipidemia Father   . Coronary artery disease Father   . Heart disease Father   . Prostate cancer Father   . Pancreatic cancer Father   . Heart disease Brother   . Diabetes Maternal Grandmother   .  Diabetes Maternal Grandfather   . Diabetes Paternal Grandmother   . Diabetes Paternal Grandfather   . Kidney disease Neg Hx   . Cancer Neg Hx    History  Substance Use Topics  . Smoking status: Never Smoker   . Smokeless tobacco: Never Used  . Alcohol Use: No   OB History   Grav Para Term Preterm Abortions TAB SAB Ect Mult Living                 Review of Systems  Gastrointestinal: Positive for nausea.  Neurological: Positive for dizziness.  All other systems reviewed and are negative.    Allergies  Erythromycin and Penicillins  Home Medications   Current Outpatient Rx  Name  Route  Sig  Dispense  Refill  . acetaminophen (TYLENOL) 650 MG CR tablet   Oral   Take 1,300 mg by mouth every 8 (eight) hours as needed for pain.         Marland Kitchen atorvastatin (LIPITOR) 40 MG tablet   Oral   Take 40 mg by mouth at bedtime.           . insulin glargine (LANTUS) 100 UNIT/ML injection   Subcutaneous   Inject 60 Units into the skin 2 (two) times daily.          Marland Kitchen  insulin regular (NOVOLIN R,HUMULIN R) 100 units/mL injection   Subcutaneous   Inject 20 Units into the skin 3 (three) times daily before meals.         Marland Kitchen lisinopril (PRINIVIL,ZESTRIL) 10 MG tablet   Oral   Take 1 tablet (10 mg total) by mouth daily.   90 tablet   3   . meclizine (ANTIVERT) 25 MG tablet   Oral   Take 1 tablet (25 mg total) by mouth 3 (three) times daily as needed for dizziness or nausea.   30 tablet   0   . metFORMIN (GLUCOPHAGE) 1000 MG tablet   Oral   Take 1,000 mg by mouth 2 (two) times daily with a meal.         . metoprolol tartrate (LOPRESSOR) 25 MG tablet   Oral   Take 12.5 mg by mouth 2 (two) times daily.         Marland Kitchen omeprazole (PRILOSEC) 20 MG capsule   Oral   Take 20 mg by mouth daily.          BP 140/60  Pulse 66  Temp(Src) 97.9 F (36.6 C) (Oral)  Resp 20  Ht 5' (1.524 m)  Wt 178 lb (80.74 kg)  BMI 34.76 kg/m2  SpO2 98% Physical Exam  Nursing note and vitals  reviewed. Constitutional: She is oriented to person, place, and time. She appears well-developed and well-nourished. No distress.  HENT:  Head: Normocephalic and atraumatic.  Right Ear: External ear normal.  Left Ear: External ear normal.  Mouth/Throat: Oropharynx is clear and moist. No oropharyngeal exudate.  Eyes: Conjunctivae and EOM are normal. Pupils are equal, round, and reactive to light.  Neck: Normal range of motion.  Cardiovascular: Normal rate and regular rhythm.  Exam reveals no gallop and no friction rub.   No murmur heard. Pulmonary/Chest: Effort normal and breath sounds normal. She has no wheezes. She has no rales. She exhibits no tenderness.  Abdominal: Soft. She exhibits no distension. There is no tenderness. There is no rebound.  Musculoskeletal: Normal range of motion.  Neurological: She is alert and oriented to person, place, and time. Coordination normal.  Speech is goal-oriented. Moves limbs without ataxia.   Skin: Skin is warm and dry.  Psychiatric: She has a normal mood and affect. Her behavior is normal.    ED Course   Procedures (including critical care time)  Labs Reviewed  GLUCOSE, CAPILLARY - Abnormal; Notable for the following:    Glucose-Capillary 183 (*)    All other components within normal limits  POCT I-STAT, CHEM 8 - Abnormal; Notable for the following:    Glucose, Bld 213 (*)    All other components within normal limits  CBC WITH DIFFERENTIAL   No results found.  1. Vertigo     MDM  10:17 AM Patient given fluids and IV valium for symptoms. Vitals stable and patient is afebrile. No neuro deficits.   11:04 AM Patient feeling better. Patient likely has benign positional vertigo. Dizziness is reproducible with head movement and symptoms are intermittent, lasting seconds at a time. I doubt posterior circulation stroke at this time. Labs unremarkable. Patient will be discharged with instructions to follow up with PCP. Patient instructed to  return with worsening or concerning symptoms.   Emilia Beck, PA-C 12/08/12 1109

## 2012-12-12 NOTE — ED Provider Notes (Signed)
Medical screening examination/treatment/procedure(s) were performed by non-physician practitioner and as supervising physician I was immediately available for consultation/collaboration.  Candyce Churn, MD 12/12/12 [phone]

## 2012-12-21 ENCOUNTER — Other Ambulatory Visit: Payer: Self-pay

## 2012-12-21 MED ORDER — DIAZEPAM 5 MG PO TABS: 5.0000 mg | ORAL_TABLET | Freq: Two times a day (BID) | ORAL | Status: DC

## 2012-12-21 NOTE — Telephone Encounter (Signed)
Pt left v/m requesting refill diazepam to [facility]; pt seen couple of weeks ago for inner ear at ED and meclizine not help but diazepam does help. Pt request cb.

## 2012-12-21 NOTE — Telephone Encounter (Signed)
Follow up if no further improvement Px written for call in

## 2012-12-24 NOTE — Telephone Encounter (Signed)
Rx called in as prescribed and left voicemail letting pt know Rx sent to pharmacy

## 2013-01-29 ENCOUNTER — Other Ambulatory Visit: Payer: Self-pay | Admitting: Family Medicine

## 2013-02-02 ENCOUNTER — Encounter (HOSPITAL_COMMUNITY): Payer: Self-pay

## 2013-02-02 ENCOUNTER — Emergency Department (HOSPITAL_COMMUNITY)
Admission: EM | Admit: 2013-02-02 | Discharge: 2013-02-02 | Disposition: A | Discharge status: Home / Self Care | Payer: BC Managed Care – PPO | Attending: Emergency Medicine | Admitting: Emergency Medicine

## 2013-02-02 DIAGNOSIS — K219 Gastro-esophageal reflux disease without esophagitis: Secondary | ICD-10-CM | POA: Insufficient documentation

## 2013-02-02 DIAGNOSIS — Z794 Long term (current) use of insulin: Secondary | ICD-10-CM | POA: Insufficient documentation

## 2013-02-02 DIAGNOSIS — F3289 Other specified depressive episodes: Secondary | ICD-10-CM | POA: Insufficient documentation

## 2013-02-02 DIAGNOSIS — Z8639 Personal history of other endocrine, nutritional and metabolic disease: Secondary | ICD-10-CM | POA: Insufficient documentation

## 2013-02-02 DIAGNOSIS — F329 Major depressive disorder, single episode, unspecified: Secondary | ICD-10-CM | POA: Insufficient documentation

## 2013-02-02 DIAGNOSIS — R110 Nausea: Secondary | ICD-10-CM | POA: Insufficient documentation

## 2013-02-02 DIAGNOSIS — Z8709 Personal history of other diseases of the respiratory system: Secondary | ICD-10-CM | POA: Insufficient documentation

## 2013-02-02 DIAGNOSIS — Z8669 Personal history of other diseases of the nervous system and sense organs: Secondary | ICD-10-CM | POA: Insufficient documentation

## 2013-02-02 DIAGNOSIS — Z8742 Personal history of other diseases of the female genital tract: Secondary | ICD-10-CM | POA: Insufficient documentation

## 2013-02-02 DIAGNOSIS — Z880 Allergy status to penicillin: Secondary | ICD-10-CM | POA: Insufficient documentation

## 2013-02-02 DIAGNOSIS — Z8619 Personal history of other infectious and parasitic diseases: Secondary | ICD-10-CM | POA: Insufficient documentation

## 2013-02-02 DIAGNOSIS — E1149 Type 2 diabetes mellitus with other diabetic neurological complication: Secondary | ICD-10-CM | POA: Insufficient documentation

## 2013-02-02 DIAGNOSIS — Z862 Personal history of diseases of the blood and blood-forming organs and certain disorders involving the immune mechanism: Secondary | ICD-10-CM | POA: Insufficient documentation

## 2013-02-02 DIAGNOSIS — E1142 Type 2 diabetes mellitus with diabetic polyneuropathy: Secondary | ICD-10-CM | POA: Insufficient documentation

## 2013-02-02 DIAGNOSIS — R42 Dizziness and giddiness: Secondary | ICD-10-CM | POA: Insufficient documentation

## 2013-02-02 DIAGNOSIS — Z79899 Other long term (current) drug therapy: Secondary | ICD-10-CM | POA: Insufficient documentation

## 2013-02-02 MED ORDER — DIAZEPAM 5 MG/ML IJ SOLN
5.0000 mg | Freq: Once | INTRAMUSCULAR | Status: AC
  Administered 2013-02-02: 5 mg via INTRAMUSCULAR
  Filled 2013-02-02: qty 2

## 2013-02-02 NOTE — ED Notes (Signed)
Notified EDP that pt found a ride home.

## 2013-02-02 NOTE — ED Notes (Addendum)
Pt has been having small episodes of dizziness intermittently for a week and pt has been taking Meclizine.  Pt states that has been working, but last night it did not relieve the dizziness.  Pt has history of inner ear problems.  Pt checked her CBG last night was 205 and pt states that is actually good for her.

## 2013-02-02 NOTE — ED Provider Notes (Signed)
CSN: [id number]     Arrival date & time 02/02/13  4098 History   First MD Initiated Contact with Patient 02/02/13 [phone]     Chief Complaint  Patient presents with  . Dizziness   (Consider location/radiation/quality/duration/timing/severity/associated sxs/prior Treatment) HPI Comments: Pt comes in with cc of vertigo. Pt has hx of peripheral vertigo, and has valium prescribed. States that she has intermittent vertigo, but it was worse today. Her symptoms are worse with movement, and described as spinning with some nausea. Pt denies any recent trauma, ear ringing, hearing loss, pt denies fevers, chills, chest pains, shortness of breath, headaches, abdominal pain.   The history is provided by the patient.    Past Medical History  Diagnosis Date  . History of chicken pox   . Depression   . Type 2 diabetes, uncontrolled, with neuropathy     dx 1993  . Chronic bronchitis   . Syncopal episodes   . GERD (gastroesophageal reflux disease)   . Seasonal allergies   . High blood pressure   . HLD (hyperlipidemia)   . Seizure 1977    off AEDs  . History of ovarian cyst   . Tubal pregnancy   . Tachyarrhythmia     ER visits with adenosine push to control   Past Surgical History  Procedure Laterality Date  . Tonsillectomy and adenoidectomy  1965  . Ovarian cyst surgery  1998  . Dilation and curettage of uterus  1984  . Ectopic pregnancy surgery  1984    R tube removal 2/2 hemorrhage  . Rotator cuff repair  2006    Right   Family History  Problem Relation Age of Onset  . Diabetes Mother   . Hyperlipidemia Mother   . Hypertension Mother   . Stroke Mother   . Coronary artery disease Mother   . Heart disease Mother   . Diabetes Father   . Hypertension Father   . Hyperlipidemia Father   . Coronary artery disease Father   . Heart disease Father   . Prostate cancer Father   . Pancreatic cancer Father   . Heart disease Brother   . Diabetes Maternal Grandmother   . Diabetes Maternal  Grandfather   . Diabetes Paternal Grandmother   . Diabetes Paternal Grandfather   . Kidney disease Neg Hx   . Cancer Neg Hx    History  Substance Use Topics  . Smoking status: Never Smoker   . Smokeless tobacco: Never Used  . Alcohol Use: No   OB History   Grav Para Term Preterm Abortions TAB SAB Ect Mult Living                 Review of Systems  Constitutional: Positive for activity change.  HENT: Negative for ear pain and neck pain.   Eyes: Negative for photophobia and visual disturbance.  Respiratory: Negative for shortness of breath.   Cardiovascular: Negative for chest pain.  Gastrointestinal: Negative for nausea, vomiting and abdominal pain.  Genitourinary: Negative for dysuria.  Neurological: Positive for light-headedness. Negative for syncope, weakness and headaches.    Allergies  Erythromycin and Penicillins  Home Medications   Current Outpatient Rx  Name  Route  Sig  Dispense  Refill  . acetaminophen (TYLENOL) 650 MG CR tablet   Oral   Take 1,300 mg by mouth every 8 (eight) hours as needed for pain.         . diazepam (VALIUM) 5 MG tablet   Oral   Take 5 mg  by mouth 2 (two) times daily as needed for anxiety.         . insulin glargine (LANTUS) 100 UNIT/ML injection   Subcutaneous   Inject 60 Units into the skin 2 (two) times daily.          . insulin regular (NOVOLIN R,HUMULIN R) 100 units/mL injection   Subcutaneous   Inject 20 Units into the skin 3 (three) times daily before meals.         Marland Kitchen lisinopril (PRINIVIL,ZESTRIL) 10 MG tablet   Oral   Take 1 tablet (10 mg total) by mouth daily.   90 tablet   3   . meclizine (ANTIVERT) 25 MG tablet   Oral   Take 1 tablet (25 mg total) by mouth 3 (three) times daily as needed for dizziness or nausea.   30 tablet   0   . metFORMIN (GLUCOPHAGE) 1000 MG tablet   Oral   Take 1,000 mg by mouth 2 (two) times daily with a meal.         . metoprolol tartrate (LOPRESSOR) 25 MG tablet   Oral    Take 12.5 mg by mouth 2 (two) times daily.         Marland Kitchen omeprazole (PRILOSEC) 20 MG capsule   Oral   Take 20 mg by mouth daily.         . promethazine (PHENERGAN) 25 MG tablet   Oral   Take 1 tablet (25 mg total) by mouth every 6 (six) hours as needed for nausea.   12 tablet   0    BP 158/75  Pulse 62  Temp(Src) 97.8 F (36.6 C) (Oral)  Resp 16  SpO2 98% Physical Exam  Nursing note and vitals reviewed. Constitutional: She is oriented to person, place, and time. She appears well-developed and well-nourished.  HENT:  Head: Normocephalic and atraumatic.  Piedad Climes halpike was aborted - patient started having vertigo with simply sitting up, worse with movement of the head.  Eyes: EOM are normal. Pupils are equal, round, and reactive to light.  No nystagmus appreciated.  Neck: Neck supple.  Cardiovascular: Normal rate, regular rhythm and normal heart sounds.   No murmur heard. Pulmonary/Chest: Effort normal. No respiratory distress.  Abdominal: Soft. She exhibits no distension. There is no tenderness. There is no rebound and no guarding.  Neurological: She is alert and oriented to person, place, and time.  Cerebellar exam is normal (finger to nose) Sensory exam normal for bilateral upper and lower extremities - and patient is able to discriminate between sharp and dull. Motor exam is 4+/5  Skin: Skin is warm and dry.    ED Course  Procedures (including critical care time) Labs Review Labs Reviewed - No data to display Imaging Review No results found.  MDM   1. Vertigo     Pt comes in with what appears to be a peripheral vertigo. DDX:  BPPV  Vestibular neuritis  Meniere disease  Migrainous vertigo  Ear Infection   Neuro exam is otherwise non focal. Has hx of vertigo, responds to valium - and the current sx are similar to previous attacks. I gave patient IM valium - and she feels better. She already has valium refills, and i have prescribed epley maneuver and  provided ENT information for follow up.  Derwood Kaplan, MD 02/02/13 1333

## 2013-02-04 ENCOUNTER — Telehealth (HOSPITAL_COMMUNITY): Payer: Self-pay | Admitting: Emergency Medicine

## 2013-02-14 ENCOUNTER — Telehealth: Payer: Self-pay | Admitting: *Deleted

## 2013-02-14 NOTE — Telephone Encounter (Signed)
Received Lantus from patient assistance. Left message notifying patient that Lantus was here for pick up.

## 2013-03-06 ENCOUNTER — Encounter: Payer: Self-pay | Admitting: Family Medicine

## 2013-03-06 ENCOUNTER — Other Ambulatory Visit: Payer: Self-pay | Admitting: *Deleted

## 2013-03-06 MED ORDER — METOPROLOL TARTRATE 25 MG PO TABS: ORAL_TABLET | ORAL | Status: DC

## 2013-03-31 ENCOUNTER — Other Ambulatory Visit: Payer: Self-pay | Admitting: Family Medicine

## 2013-03-31 DIAGNOSIS — I10 Essential (primary) hypertension: Secondary | ICD-10-CM

## 2013-03-31 DIAGNOSIS — E785 Hyperlipidemia, unspecified: Secondary | ICD-10-CM

## 2013-03-31 DIAGNOSIS — E1140 Type 2 diabetes mellitus with diabetic neuropathy, unspecified: Secondary | ICD-10-CM

## 2013-04-01 ENCOUNTER — Other Ambulatory Visit: Payer: Self-pay | Admitting: Family Medicine

## 2013-04-01 ENCOUNTER — Other Ambulatory Visit: Payer: BC Managed Care – PPO

## 2013-04-01 NOTE — Telephone Encounter (Signed)
Dr. Reece Agar wanted pt to come in to see him for CPE to have meds refilled. Pt will get insurance first of the year and r/s until then because she can't afford to come in with-out insurance. Pt is wanting to know if she could get meds refilled until then ?? I told her I would ask but I wasn't sure how that was going to work.

## 2013-04-01 NOTE — Telephone Encounter (Signed)
Will have to ask Dr.G. Patient told me she would have insurance by now and has been telling me this over and over for months. No follow up on meds or chronic conditions in over 1 year.

## 2013-04-02 ENCOUNTER — Encounter: Payer: BC Managed Care – PPO | Admitting: Family Medicine

## 2013-04-04 MED ORDER — LISINOPRIL 10 MG PO TABS: 10.0000 mg | ORAL_TABLET | Freq: Every day | ORAL | Status: DC

## 2013-04-04 MED ORDER — METFORMIN HCL 1000 MG PO TABS: 1000.0000 mg | ORAL_TABLET | Freq: Two times a day (BID) | ORAL | Status: DC

## 2013-04-04 MED ORDER — METOPROLOL TARTRATE 25 MG PO TABS: ORAL_TABLET | ORAL | Status: DC

## 2013-04-04 NOTE — Telephone Encounter (Signed)
Message left advising patient. Advised that she had to have appt for CPE or no more RF's. Advised this was liability for Dr. Reece Agar to keep prescribing meds for a patient he hasn't followed up with except for acute problems. Suggested indegent clinic if not able to afford visit at the beginning of the year.

## 2013-04-04 NOTE — Telephone Encounter (Signed)
Will refill until beginning of 2015 but pt will need to schedule appt at that time.  If unable to afford visit, may need to establish with local indigent clinic in [place].

## 2013-04-09 ENCOUNTER — Emergency Department (HOSPITAL_COMMUNITY)
Admission: EM | Admit: 2013-04-09 | Discharge: 2013-04-09 | Disposition: A | Discharge status: Home / Self Care | Payer: No Typology Code available for payment source | Attending: Emergency Medicine | Admitting: Emergency Medicine

## 2013-04-09 ENCOUNTER — Encounter (HOSPITAL_COMMUNITY): Payer: Self-pay | Admitting: Emergency Medicine

## 2013-04-09 DIAGNOSIS — R569 Unspecified convulsions: Secondary | ICD-10-CM | POA: Insufficient documentation

## 2013-04-09 DIAGNOSIS — F3289 Other specified depressive episodes: Secondary | ICD-10-CM | POA: Insufficient documentation

## 2013-04-09 DIAGNOSIS — Z881 Allergy status to other antibiotic agents status: Secondary | ICD-10-CM | POA: Insufficient documentation

## 2013-04-09 DIAGNOSIS — K219 Gastro-esophageal reflux disease without esophagitis: Secondary | ICD-10-CM | POA: Insufficient documentation

## 2013-04-09 DIAGNOSIS — R42 Dizziness and giddiness: Secondary | ICD-10-CM | POA: Insufficient documentation

## 2013-04-09 DIAGNOSIS — J309 Allergic rhinitis, unspecified: Secondary | ICD-10-CM | POA: Insufficient documentation

## 2013-04-09 DIAGNOSIS — Z8709 Personal history of other diseases of the respiratory system: Secondary | ICD-10-CM | POA: Insufficient documentation

## 2013-04-09 DIAGNOSIS — Z8679 Personal history of other diseases of the circulatory system: Secondary | ICD-10-CM | POA: Insufficient documentation

## 2013-04-09 DIAGNOSIS — J3489 Other specified disorders of nose and nasal sinuses: Secondary | ICD-10-CM | POA: Insufficient documentation

## 2013-04-09 DIAGNOSIS — Z8619 Personal history of other infectious and parasitic diseases: Secondary | ICD-10-CM | POA: Insufficient documentation

## 2013-04-09 DIAGNOSIS — Z8742 Personal history of other diseases of the female genital tract: Secondary | ICD-10-CM | POA: Insufficient documentation

## 2013-04-09 DIAGNOSIS — E785 Hyperlipidemia, unspecified: Secondary | ICD-10-CM | POA: Insufficient documentation

## 2013-04-09 DIAGNOSIS — Z880 Allergy status to penicillin: Secondary | ICD-10-CM | POA: Insufficient documentation

## 2013-04-09 DIAGNOSIS — I10 Essential (primary) hypertension: Secondary | ICD-10-CM | POA: Insufficient documentation

## 2013-04-09 DIAGNOSIS — R110 Nausea: Secondary | ICD-10-CM | POA: Insufficient documentation

## 2013-04-09 DIAGNOSIS — N390 Urinary tract infection, site not specified: Secondary | ICD-10-CM | POA: Insufficient documentation

## 2013-04-09 DIAGNOSIS — F329 Major depressive disorder, single episode, unspecified: Secondary | ICD-10-CM | POA: Insufficient documentation

## 2013-04-09 DIAGNOSIS — E1169 Type 2 diabetes mellitus with other specified complication: Secondary | ICD-10-CM | POA: Insufficient documentation

## 2013-04-09 DIAGNOSIS — Z794 Long term (current) use of insulin: Secondary | ICD-10-CM | POA: Insufficient documentation

## 2013-04-09 DIAGNOSIS — Z8669 Personal history of other diseases of the nervous system and sense organs: Secondary | ICD-10-CM | POA: Insufficient documentation

## 2013-04-09 DIAGNOSIS — Z79899 Other long term (current) drug therapy: Secondary | ICD-10-CM | POA: Insufficient documentation

## 2013-04-09 LAB — COMPREHENSIVE METABOLIC PANEL
AST: 26 U/L (ref 0–37)
Albumin: 4 g/dL (ref 3.5–5.2)
BUN: 17 mg/dL (ref 6–23)
Calcium: 9.7 mg/dL (ref 8.4–10.5)
Chloride: 99 mEq/L (ref 96–112)
Creatinine, Ser: 0.5 mg/dL (ref 0.50–1.10)
Total Protein: 7.2 g/dL (ref 6.0–8.3)

## 2013-04-09 LAB — CBC WITH DIFFERENTIAL/PLATELET
Basophils Absolute: 0 10*3/uL (ref 0.0–0.1)
Basophils Relative: 0 % (ref 0–1)
Eosinophils Absolute: 0.3 10*3/uL (ref 0.0–0.7)
HCT: 42.2 % (ref 36.0–46.0)
Hemoglobin: 14.9 g/dL (ref 12.0–15.0)
MCH: 29.6 pg (ref 26.0–34.0)
MCHC: 35.3 g/dL (ref 30.0–36.0)
Monocytes Absolute: 0.5 10*3/uL (ref 0.1–1.0)
Monocytes Relative: 6 % (ref 3–12)
Neutro Abs: 3.2 10*3/uL (ref 1.7–7.7)
Neutrophils Relative %: 43 % (ref 43–77)
RDW: 13.4 % (ref 11.5–15.5)

## 2013-04-09 LAB — URINALYSIS W MICROSCOPIC + REFLEX CULTURE
Glucose, UA: >1000 mg/dL — AB
Ketones, ur: NEGATIVE mg/dL
Nitrite: NEGATIVE
Specific Gravity, Urine: 1.026 (ref 1.005–1.030)
pH: 5 (ref 5.0–8.0)

## 2013-04-09 LAB — GLUCOSE, CAPILLARY: Glucose-Capillary: 93 mg/dL (ref 70–99)

## 2013-04-09 MED ORDER — MECLIZINE HCL 25 MG PO TABS: 25.0000 mg | ORAL_TABLET | Freq: Three times a day (TID) | ORAL | Status: DC | PRN

## 2013-04-09 MED ORDER — CEPHALEXIN 500 MG PO CAPS: 500.0000 mg | ORAL_CAPSULE | Freq: Three times a day (TID) | ORAL | Status: DC

## 2013-04-09 MED ORDER — DIAZEPAM 5 MG/ML IJ SOLN
5.0000 mg | Freq: Once | INTRAMUSCULAR | Status: AC
  Administered 2013-04-09: 5 mg via INTRAMUSCULAR
  Filled 2013-04-09: qty 2

## 2013-04-09 MED ORDER — DIAZEPAM 5 MG PO TABS: 5.0000 mg | ORAL_TABLET | Freq: Three times a day (TID) | ORAL | Status: DC | PRN

## 2013-04-09 MED ORDER — MECLIZINE HCL 25 MG PO TABS
50.0000 mg | ORAL_TABLET | Freq: Once | ORAL | Status: AC
  Administered 2013-04-09: 50 mg via ORAL
  Filled 2013-04-09 (×2): qty 2

## 2013-04-09 NOTE — ED Provider Notes (Signed)
CSN: [id number]     Arrival date & time 04/09/13  1424 History   First MD Initiated Contact with Patient 04/09/13 1528     Chief Complaint  Patient presents with  . Dizziness   (Consider location/radiation/quality/duration/timing/severity/associated sxs/prior Treatment) HPI Karen [name] is a 56 y.o. female who presents to emergency department complaint of dizziness. States has history of vertigo. Patient states she normally takes meclizine and Valium for vertigo. States she was at work today when suddenly she started feeling "drunk." States all surrounding began to spin and she became nauseated. States she was unable to walk without holding onto things. Patient however did walk to her car and drove herself here. Patient states she had similar episode just 3 days ago and states she took meclizine which helped at that time. States this time her dizziness was more to more severe than 3 days ago and she knew she had to come to emergency department. Patient denies any other neurological deficits at this time. She denies any visual changes, denies any weakness or numbness in extremities, denies any memory problems, facial droop, confustion. States dizziness worse with changing in position.   Past Medical History  Diagnosis Date  . History of chicken pox   . Depression   . Type 2 diabetes, uncontrolled, with neuropathy     dx 1993  . Chronic bronchitis   . Syncopal episodes   . GERD (gastroesophageal reflux disease)   . Seasonal allergies   . High blood pressure   . HLD (hyperlipidemia)   . Seizure 1977    off AEDs  . History of ovarian cyst   . Tubal pregnancy   . Tachyarrhythmia     ER visits with adenosine push to control   Past Surgical History  Procedure Laterality Date  . Tonsillectomy and adenoidectomy  1965  . Ovarian cyst surgery  1998  . Dilation and curettage of uterus  1984  . Ectopic pregnancy surgery  1984    R tube removal 2/2 hemorrhage  . Rotator cuff repair  2006   Right   Family History  Problem Relation Age of Onset  . Diabetes Mother   . Hyperlipidemia Mother   . Hypertension Mother   . Stroke Mother   . Coronary artery disease Mother   . Heart disease Mother   . Diabetes Father   . Hypertension Father   . Hyperlipidemia Father   . Coronary artery disease Father   . Heart disease Father   . Prostate cancer Father   . Pancreatic cancer Father   . Heart disease Brother   . Diabetes Maternal Grandmother   . Diabetes Maternal Grandfather   . Diabetes Paternal Grandmother   . Diabetes Paternal Grandfather   . Kidney disease Neg Hx   . Cancer Neg Hx    History  Substance Use Topics  . Smoking status: Never Smoker   . Smokeless tobacco: Never Used  . Alcohol Use: No   OB History   Grav Para Term Preterm Abortions TAB SAB Ect Mult Living                 Review of Systems  Constitutional: Negative for fever and chills.  HENT: Positive for congestion. Negative for ear pain.   Respiratory: Negative for cough, chest tightness and shortness of breath.   Cardiovascular: Negative for chest pain, palpitations and leg swelling.  Gastrointestinal: Negative for nausea, vomiting, abdominal pain and diarrhea.  Genitourinary: Negative for dysuria, flank pain, vaginal bleeding, vaginal  discharge, vaginal pain and pelvic pain.  Musculoskeletal: Negative for arthralgias and myalgias.  Skin: Negative for rash.  Neurological: Positive for dizziness and light-headedness. Negative for seizures, syncope, weakness and headaches.  All other systems reviewed and are negative.    Allergies  Erythromycin and Penicillins  Home Medications   Current Outpatient Rx  Name  Route  Sig  Dispense  Refill  . acetaminophen (TYLENOL) 650 MG CR tablet   Oral   Take 1,300 mg by mouth every 8 (eight) hours as needed for pain.         . diazepam (VALIUM) 5 MG tablet   Oral   Take 5 mg by mouth 2 (two) times daily as needed for anxiety.         . insulin  glargine (LANTUS) 100 UNIT/ML injection   Subcutaneous   Inject 60 Units into the skin 2 (two) times daily.          . insulin regular (NOVOLIN R,HUMULIN R) 100 units/mL injection   Subcutaneous   Inject 20 Units into the skin 3 (three) times daily before meals.         Marland Kitchen lisinopril (PRINIVIL,ZESTRIL) 10 MG tablet   Oral   Take 1 tablet (10 mg total) by mouth daily.   90 tablet   1   . meclizine (ANTIVERT) 25 MG tablet   Oral   Take 1 tablet (25 mg total) by mouth 3 (three) times daily as needed for dizziness or nausea.   30 tablet   0   . metFORMIN (GLUCOPHAGE) 1000 MG tablet   Oral   Take 1 tablet (1,000 mg total) by mouth 2 (two) times daily with a meal.   60 tablet   5   . metoprolol tartrate (LOPRESSOR) 25 MG tablet      Take one-half of a tablet two times daily.   30 tablet   5   . omeprazole (PRILOSEC) 20 MG capsule   Oral   Take 20 mg by mouth daily.         . promethazine (PHENERGAN) 25 MG tablet   Oral   Take 1 tablet (25 mg total) by mouth every 6 (six) hours as needed for nausea.   12 tablet   0    BP 172/71  Pulse 87  Temp(Src) 97.5 F (36.4 C) (Oral)  Resp 18  Ht 5\' 1"  (1.549 m)  Wt 183 lb (83.008 kg)  BMI 34.60 kg/m2  SpO2 96% Physical Exam  Nursing note and vitals reviewed. Constitutional: She is oriented to person, place, and time. She appears well-developed and well-nourished. No distress.  HENT:  Head: Normocephalic.  Right Ear: External ear normal.  Nose: Nose normal.  Mouth/Throat: Oropharynx is clear and moist.  TMs normal bilaterally  Eyes: Conjunctivae and EOM are normal. Pupils are equal, round, and reactive to light.  No nystagmus  Neck: Neck supple.  Cardiovascular: Normal rate, regular rhythm and normal heart sounds.   Pulmonary/Chest: Effort normal and breath sounds normal. No respiratory distress. She has no wheezes. She has no rales.  Abdominal: Soft. Bowel sounds are normal. She exhibits no distension. There is no  tenderness. There is no rebound.  Musculoskeletal: She exhibits no edema.  Neurological: She is alert and oriented to person, place, and time. No cranial nerve deficit.  5/5 and equal upper and lower extremity strength bilaterally. Equal grip strength bilaterally. Normal finger to nose and heel to shin. No pronator drift.   Skin: Skin is warm  and dry.  Psychiatric: She has a normal mood and affect. Her behavior is normal.    ED Course  Procedures (including critical care time) Labs Review Labs Reviewed  CBC WITH DIFFERENTIAL - Abnormal; Notable for the following:    Lymphocytes Relative 47 (*)    All other components within normal limits  COMPREHENSIVE METABOLIC PANEL - Abnormal; Notable for the following:    Glucose, Bld 114 (*)    All other components within normal limits  URINALYSIS W MICROSCOPIC + REFLEX CULTURE - Abnormal; Notable for the following:    APPearance HAZY (*)    Glucose, UA >1000 (*)    Leukocytes, UA MODERATE (*)    Bacteria, UA FEW (*)    Squamous Epithelial / LPF MANY (*)    All other components within normal limits  URINE CULTURE  GLUCOSE, CAPILLARY   Imaging Review No results found.  EKG Interpretation   None       MDM   1. Vertigo   2. UTI (lower urinary tract infection)     Patient with typical for her vertigo. She has noted a neuro deficits. I treated her symptoms with Valium 5 mg IM and meclizine 50 mg by mouth. Patient states her dizziness resolves. She is ambulatory in the hallway with no complaints. Her labs are unremarkable, her urine did show possible UTI. Will discharge her home with Keflex for her urinary tract infection" with primary care Dr. precautions given to return if her symptoms are worsening  Filed Vitals:   04/09/13 1615 04/09/13 1619 04/09/13 1622 04/09/13 1630  BP: 146/61 149/61 148/60 144/55  Pulse: 84 85 93 87  Temp:      TempSrc:      Resp:    17  Height:      Weight:      SpO2:    95%      Lorayne Getchell A  Gurjit Loconte, PA-C 04/10/13 0000

## 2013-04-09 NOTE — ED Notes (Signed)
Pt c/o dizziness x 3 days that feels like room spinning and vertigo in past; pt sts some nausea

## 2013-04-09 NOTE — ED Notes (Signed)
Ambulated pt in the hall with no complaints or dizziness.Marland Kitchen

## 2013-04-10 LAB — URINE CULTURE
Colony Count: NO GROWTH
Culture: NO GROWTH

## 2013-04-11 NOTE — ED Provider Notes (Signed)
Medical screening examination/treatment/procedure(s) were performed by non-physician practitioner and as supervising physician I was immediately available for consultation/collaboration.     Virgel Haro, MD 04/11/13 1546 

## 2013-04-19 ENCOUNTER — Ambulatory Visit: Payer: BC Managed Care – PPO | Admitting: Family Medicine

## 2013-04-23 ENCOUNTER — Ambulatory Visit: Payer: BC Managed Care – PPO | Admitting: Family Medicine

## 2013-04-25 ENCOUNTER — Encounter: Payer: Self-pay | Admitting: Family Medicine

## 2013-04-25 ENCOUNTER — Telehealth: Payer: Self-pay | Admitting: *Deleted

## 2013-04-25 ENCOUNTER — Ambulatory Visit (INDEPENDENT_AMBULATORY_CARE_PROVIDER_SITE_OTHER): Payer: Self-pay | Admitting: Family Medicine

## 2013-04-25 VITALS — BP 140/82 | HR 72 | Temp 98.0°F | Wt 183.5 lb

## 2013-04-25 DIAGNOSIS — R42 Dizziness and giddiness: Secondary | ICD-10-CM

## 2013-04-25 DIAGNOSIS — E1140 Type 2 diabetes mellitus with diabetic neuropathy, unspecified: Secondary | ICD-10-CM

## 2013-04-25 DIAGNOSIS — E1142 Type 2 diabetes mellitus with diabetic polyneuropathy: Secondary | ICD-10-CM

## 2013-04-25 DIAGNOSIS — I10 Essential (primary) hypertension: Secondary | ICD-10-CM

## 2013-04-25 DIAGNOSIS — E1149 Type 2 diabetes mellitus with other diabetic neurological complication: Secondary | ICD-10-CM

## 2013-04-25 MED ORDER — CITALOPRAM HYDROBROMIDE 20 MG PO TABS: 20.0000 mg | ORAL_TABLET | Freq: Every day | ORAL | Status: DC

## 2013-04-25 MED ORDER — LISINOPRIL 2.5 MG PO TABS: 2.5000 mg | ORAL_TABLET | Freq: Every day | ORAL | Status: DC

## 2013-04-25 NOTE — Assessment & Plan Note (Signed)
Discussed antihypertensives - pt feels 10mg  lisinopril and even 5mg  lisinopril to strong for her - feels "fatigued" Recommended trial of 2.5mg  lisinopril - sent in to pharmacy.

## 2013-04-25 NOTE — Progress Notes (Signed)
   Subjective:    Patient ID: Karen [name], female    DOB: [birth year], 57 y.o.   MRN: [id number]  HPI CC: dizziness  To receive insurance 05/16/2013 - [employer].  Will return for physical next year.  Pleasant 57 yo pt of mine with no insurance and h/o nonadherence 2/2 this, with h/o uncontrolled T2DM with neuropathy, GERD, HTN, HLD, h/o seizures presents to discuss persistent dizziness.  She has history of vertigo in the past. She was seen at [facility] latest 04/10/2013 and treated for vertigo with valium and meclizine which resolved her vertigo.  Meclizine sometimes helps, sometimes doesn't. She was also found to have a UTI without symptoms - treated with keflex.  UCx - no growth.  States urinary symptoms started after she was taking antibiotic but this has resolved.  Denies urinary symptoms this week (dysuria, pain, urgency or frequency, or hematuria).  Describes typical vertigo "room spinning" that last 5-15 min.  Feels these episodes are getting better.  H/o R ear dear - unknown reason.  Intermittent R ear tinnitus.  No tinnitus or hearing loss on left side.  Drives despite having these episodes.  DM - sugars running "high".  Doesn't check sugars.  Endorses compliance with lantus 60 u bid and metformin 1000mg  bid.  Does not currently use novolin R. HTN - off lisinopril 10mg  daily, because felt fatigued even with 5mg  dose.  Has been off hts medicine for the last week. BP Readings from Last 3 Encounters:  04/25/13 140/82  04/09/13 148/64  02/02/13 146/66    Past Medical History  Diagnosis Date  . History of chicken pox   . Depression   . Type 2 diabetes, uncontrolled, with neuropathy     dx 1993  . Chronic bronchitis   . Syncopal episodes   . GERD (gastroesophageal reflux disease)   . Seasonal allergies   . High blood pressure   . HLD (hyperlipidemia)   . Seizure 1977    off AEDs  . History of ovarian cyst   . Tubal pregnancy   . Tachyarrhythmia     ER visits with adenosine push to  control     Review of Systems Per HPI    Objective:   Physical Exam  Nursing note and vitals reviewed. Constitutional: She is oriented to person, place, and time. She appears well-developed and well-nourished. No distress.  HENT:  Right Ear: Tympanic membrane, external ear and ear canal normal.  Left Ear: Hearing, tympanic membrane, external ear and ear canal normal.  Mouth/Throat: Uvula is midline, oropharynx is clear and moist and mucous membranes are normal. No oropharyngeal exudate, posterior oropharyngeal edema, posterior oropharyngeal erythema or tonsillar abscesses.  Eyes: Conjunctivae and EOM are normal. Pupils are equal, round, and reactive to light. No scleral icterus.  Neck: Normal range of motion. Neck supple.  Cardiovascular: Normal rate, regular rhythm, normal heart sounds and intact distal pulses.   No murmur heard. Pulmonary/Chest: Effort normal and breath sounds normal. No respiratory distress. She has no wheezes. She has no rales.  Lymphadenopathy:    She has no cervical adenopathy.  Neurological: She is alert and oriented to person, place, and time. She has normal strength. No cranial nerve deficit or sensory deficit. She displays a negative Romberg sign. Coordination normal.  CN 2-12 intact Normal FTN, no pronator drift Negative dix hallpike bilaterally Mild nystagmus with end gaze to right otherwise EOMI Deaf right ear        Assessment & Plan:  Orthostatics normal today.

## 2013-04-25 NOTE — Telephone Encounter (Signed)
Patient meant to ask if she could have a refill of her citalopram 40 mg 1 QD. The holidays are a hard time for her. She really only needs the med around this time which is why it isn't on her current med list. Karin Golden.

## 2013-04-25 NOTE — Progress Notes (Signed)
Pre-visit discussion using our clinic review tool. No additional management support is needed unless otherwise documented below in the visit note.  

## 2013-04-25 NOTE — Assessment & Plan Note (Signed)
Pt reports chronic poor control.  Requests we defer A1c until next year.  She states she will come in next month for CPE. H/o noncompliance with f/u plans in past.

## 2013-04-25 NOTE — Assessment & Plan Note (Signed)
Anticipate peripheral cause - with her right hearing loss and right tinnitus concern for pathology of R vestibular nerve vs inner ear - discussed this. Discussed my recommendation for imaging and to refer to ENT.  Pt states cannot afford work up - and has declined in the past. Agrees to readdress next month at CPE when she receives insurance.

## 2013-04-25 NOTE — Telephone Encounter (Signed)
plz notify I sent in 20mg  dose - I'd like her to try lower dose first.

## 2013-04-25 NOTE — Patient Instructions (Addendum)
Return in new year for physical and pap smear. Let's start lisinopril 2.5mg  once daily. Continue metoprolol 12.5mg  twice daily. Continue lantus and metformin. Good to see you today, call us with questions. Continue valium and meclizine as needed for vertigo - we may refer you to ENT in the new year for further evaluation of your vertigo and hearing loss.

## 2013-04-26 ENCOUNTER — Telehealth: Payer: Self-pay | Admitting: *Deleted

## 2013-04-26 NOTE — Telephone Encounter (Signed)
Patient called and left message on my VM that she was still having symptoms of UTI that she had at last ER visit. (frequency,urgency, and burning) They gave her 7 days of Keflex and she was wondering if you thought that was long enough or if she needed extended course. If she needs extended course, please send to pharmacy.

## 2013-04-27 NOTE — Telephone Encounter (Signed)
If still having sxs, i recommend she come in on Monday to drop off urine sample to check or infection. UCx in hospital was no growth.

## 2013-04-29 NOTE — Telephone Encounter (Signed)
Left vm/ for pt to cb for appt.

## 2013-04-30 NOTE — Telephone Encounter (Signed)
Karen [name] spoke with pt and pt was offered multiple nurse visit appts but due to pts schedule 1st available pt could come in was 05/02/13 at 4 pm. Advised pt if condition changes or worsens to cb prior to appt.

## 2013-05-02 ENCOUNTER — Other Ambulatory Visit: Payer: Self-pay | Admitting: Family Medicine

## 2013-05-02 ENCOUNTER — Ambulatory Visit (INDEPENDENT_AMBULATORY_CARE_PROVIDER_SITE_OTHER): Payer: Self-pay | Admitting: Family Medicine

## 2013-05-02 ENCOUNTER — Encounter: Payer: Self-pay | Admitting: *Deleted

## 2013-05-02 DIAGNOSIS — N23 Unspecified renal colic: Secondary | ICD-10-CM

## 2013-05-02 DIAGNOSIS — R309 Painful micturition, unspecified: Secondary | ICD-10-CM

## 2013-05-02 LAB — POCT URINALYSIS DIPSTICK
Glucose, UA: NEGATIVE
Spec Grav, UA: >=1.03
Urobilinogen, UA: 0.2
pH, UA: 5

## 2013-05-02 MED ORDER — CIPROFLOXACIN HCL 500 MG PO TABS: 500.0000 mg | ORAL_TABLET | Freq: Two times a day (BID) | ORAL | Status: DC

## 2013-05-02 NOTE — Telephone Encounter (Signed)
Patient notified and work note provided and faxed to employer as requested per patient.

## 2013-05-02 NOTE — Progress Notes (Signed)
UA/micro concerning for infection - persistent despite recent treatment with keflex TID 7d course.  will send cipro 7d course and culture.  plz notify patient abx called to pharmacy. Lab Results  Component Value Date   CREATININE 0.50 04/09/2013

## 2013-05-02 NOTE — Telephone Encounter (Addendum)
See nurse visit. May write note for out of work for yesterday and today. Needs appt if continues to feel ill despite antibiotic.

## 2013-05-02 NOTE — Progress Notes (Signed)
   Subjective:    Patient ID: Karen [name], female    DOB: 09-17-1955, 57 y.o.   MRN: [id number]  HPI    Review of Systems     Objective:   Physical Exam        Assessment & Plan:  Pt came in for u/a with frequency of urine and voiding smaller amts,urgency,burning upon urination,low back pain,fever, achy all over and chills. Pt did not want to schedule appt. H/T [address]Pt was out of work 05/01/13 and 05/02/13 and request note for work.pt request cb.

## 2013-05-02 NOTE — Telephone Encounter (Signed)
Pt came in for u/a with frequency of urine and voiding smaller amts,urgency,burning upon urination,low back pain,fever, achy all over and chills. Pt did not want to schedule appt. H/T [address]Pt was out of work 05/01/13 and 05/02/13 and request note for work.pt request cb. Nurse visit was sent to Dr Sharen Hones desk top. Pt contact # [phone].

## 2013-05-04 LAB — URINE CULTURE: Organism ID, Bacteria: NO GROWTH

## 2013-05-24 ENCOUNTER — Other Ambulatory Visit (INDEPENDENT_AMBULATORY_CARE_PROVIDER_SITE_OTHER): Payer: No Typology Code available for payment source

## 2013-05-24 DIAGNOSIS — IMO0002 Reserved for concepts with insufficient information to code with codable children: Secondary | ICD-10-CM

## 2013-05-24 DIAGNOSIS — I10 Essential (primary) hypertension: Secondary | ICD-10-CM

## 2013-05-24 DIAGNOSIS — E785 Hyperlipidemia, unspecified: Secondary | ICD-10-CM

## 2013-05-24 DIAGNOSIS — E1142 Type 2 diabetes mellitus with diabetic polyneuropathy: Secondary | ICD-10-CM

## 2013-05-24 DIAGNOSIS — E1149 Type 2 diabetes mellitus with other diabetic neurological complication: Secondary | ICD-10-CM

## 2013-05-24 DIAGNOSIS — E1140 Type 2 diabetes mellitus with diabetic neuropathy, unspecified: Secondary | ICD-10-CM

## 2013-05-24 DIAGNOSIS — E1165 Type 2 diabetes mellitus with hyperglycemia: Secondary | ICD-10-CM

## 2013-05-24 LAB — HEMOGLOBIN A1C: Hgb A1c MFr Bld: 9.3 % — ABNORMAL HIGH (ref 4.6–6.5)

## 2013-05-24 LAB — BASIC METABOLIC PANEL
BUN: 15 mg/dL (ref 6–23)
CALCIUM: 9.2 mg/dL (ref 8.4–10.5)
CHLORIDE: 105 meq/L (ref 96–112)
CO2: 27 mEq/L (ref 19–32)
CREATININE: 0.6 mg/dL (ref 0.4–1.2)
GFR: 107.94 mL/min (ref >60.00)
GLUCOSE: 248 mg/dL — AB (ref 70–99)
Potassium: 4.8 mEq/L (ref 3.5–5.1)
Sodium: 138 mEq/L (ref 135–145)

## 2013-05-24 LAB — LIPID PANEL
CHOLESTEROL: 250 mg/dL — AB (ref 0–200)
HDL: 34.7 mg/dL — ABNORMAL LOW (ref >39.00)
Total CHOL/HDL Ratio: 7
VLDL: 108.2 mg/dL — ABNORMAL HIGH (ref 0.0–40.0)

## 2013-05-24 LAB — MICROALBUMIN / CREATININE URINE RATIO
Creatinine,U: 98.3 mg/dL
Microalb Creat Ratio: 2.6 mg/g (ref 0.0–30.0)
Microalb, Ur: 2.6 mg/dL — ABNORMAL HIGH (ref 0.0–1.9)

## 2013-05-24 LAB — LDL CHOLESTEROL, DIRECT: LDL DIRECT: 108 mg/dL

## 2013-05-27 ENCOUNTER — Encounter: Payer: BC Managed Care – PPO | Admitting: Family Medicine

## 2013-06-04 ENCOUNTER — Encounter: Payer: No Typology Code available for payment source | Admitting: Family Medicine

## 2013-06-10 ENCOUNTER — Encounter: Payer: Self-pay | Admitting: Family Medicine

## 2013-06-10 ENCOUNTER — Other Ambulatory Visit (HOSPITAL_COMMUNITY)
Admission: RE | Admit: 2013-06-10 | Discharge: 2013-06-10 | Disposition: A | Discharge status: Home / Self Care | Payer: No Typology Code available for payment source | Source: Ambulatory Visit | Attending: Family Medicine | Admitting: Family Medicine

## 2013-06-10 ENCOUNTER — Ambulatory Visit (INDEPENDENT_AMBULATORY_CARE_PROVIDER_SITE_OTHER): Payer: No Typology Code available for payment source | Admitting: Family Medicine

## 2013-06-10 VITALS — BP 134/80 | HR 88 | Temp 98.2°F | Ht 62.0 in | Wt 182.8 lb

## 2013-06-10 DIAGNOSIS — Z1211 Encounter for screening for malignant neoplasm of colon: Secondary | ICD-10-CM

## 2013-06-10 DIAGNOSIS — Z23 Encounter for immunization: Secondary | ICD-10-CM

## 2013-06-10 DIAGNOSIS — E1149 Type 2 diabetes mellitus with other diabetic neurological complication: Secondary | ICD-10-CM

## 2013-06-10 DIAGNOSIS — Z1151 Encounter for screening for human papillomavirus (HPV): Secondary | ICD-10-CM | POA: Insufficient documentation

## 2013-06-10 DIAGNOSIS — E1165 Type 2 diabetes mellitus with hyperglycemia: Secondary | ICD-10-CM

## 2013-06-10 DIAGNOSIS — E1140 Type 2 diabetes mellitus with diabetic neuropathy, unspecified: Secondary | ICD-10-CM

## 2013-06-10 DIAGNOSIS — E785 Hyperlipidemia, unspecified: Secondary | ICD-10-CM

## 2013-06-10 DIAGNOSIS — E1142 Type 2 diabetes mellitus with diabetic polyneuropathy: Secondary | ICD-10-CM

## 2013-06-10 DIAGNOSIS — I10 Essential (primary) hypertension: Secondary | ICD-10-CM

## 2013-06-10 DIAGNOSIS — Z0001 Encounter for general adult medical examination with abnormal findings: Secondary | ICD-10-CM | POA: Insufficient documentation

## 2013-06-10 DIAGNOSIS — IMO0002 Reserved for concepts with insufficient information to code with codable children: Secondary | ICD-10-CM

## 2013-06-10 DIAGNOSIS — Z01419 Encounter for gynecological examination (general) (routine) without abnormal findings: Secondary | ICD-10-CM | POA: Insufficient documentation

## 2013-06-10 DIAGNOSIS — Z0000 Encounter for general adult medical examination without abnormal findings: Secondary | ICD-10-CM

## 2013-06-10 MED ORDER — SIMVASTATIN 40 MG PO TABS: 40.0000 mg | ORAL_TABLET | Freq: Every day | ORAL | Status: DC

## 2013-06-10 NOTE — Assessment & Plan Note (Signed)
Chronically uncontrolled.  Reviewed #s, pt has not tolerated lipitor.  Will do trial of simvastatin 40mg  daily - sent in. I've asked her to notify me if any intolerable sxs. Lipid Panel     Component Value Date/Time   CHOL 250* 05/24/2013 0851   TRIG 541.0 Triglyceride is over 400; calculations on Lipids are invalid.* 05/24/2013 0851   HDL 34.70* 05/24/2013 0851   CHOLHDL 7 05/24/2013 0851   VLDL 108.2* 05/24/2013 0851   LDLCALC UNABLE TO CALCULATE IF TRIGLYCERIDE OVER 400 mg/dL 10/30/2009 2359

## 2013-06-10 NOTE — Assessment & Plan Note (Addendum)
Preventative protocols reviewed and updated unless pt declined. Discussed healthy diet and lifestyle. Pneumovax and flu shots today. Breast/pelvic/pap performed today. Stool kit today

## 2013-06-10 NOTE — Assessment & Plan Note (Signed)
Improved but still uncontrolled. I've asked Jodette to return in 2-3 months for f/u DM.

## 2013-06-10 NOTE — Progress Notes (Signed)
Subjective:    Patient ID: Karen [name], female    DOB: 09-05-1955, 58 y.o.   MRN: [id number]  HPI CC: CPE  Karen [name] presents today for an annual exam. She has not had an annual exam in years, at least since she has established here.  DM - endorses compliance with lantus 60u bid and regular insulin 20u with meals as well as metformin 1092m bid.   Lab Results  Component Value Date   HGBA1C 9.3* 05/24/2013   Caffeine: 6 diet sodas/day Lives with 2 dogs. 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  No fmhx.  Requests stool kit. Well woman exam - none recently. No OBGYN.  Pelvic/pap today.  H/o abnormal paps in past.   Mammogram - none recently.  Declines.  Breast exam today. Flu shot - today Pneumovax - today   Medications and allergies reviewed and updated in chart.  Past histories reviewed and updated if relevant as below. Patient Active Problem List   Diagnosis Date Noted  . Vertigo 04/25/2013  . Tooth pain 08/22/2012  . Lumbar radiculopathy 03/16/2011  . Depression   . Type 2 diabetes, uncontrolled, with neuropathy   . GERD (gastroesophageal reflux disease)   . Seasonal allergies   . HTN (hypertension)   . HLD (hyperlipidemia)    Past Medical History  Diagnosis Date  . History of chicken pox   . Depression   . Type 2 diabetes, uncontrolled, with neuropathy     dx 1993  . Chronic bronchitis   . Syncopal episodes   . GERD (gastroesophageal reflux disease)   . Seasonal allergies   . High blood pressure   . HLD (hyperlipidemia)   . Seizure 1977    off AEDs  . History of ovarian cyst   . Tubal pregnancy   . Tachyarrhythmia     ER visits with adenosine push to control   Past Surgical History  Procedure Laterality Date  . Tonsillectomy and adenoidectomy  1965  . Ovarian cyst surgery  1998  . Dilation and curettage of  uterus  1984  . Ectopic pregnancy surgery  1984    R tube removal 2/2 hemorrhage  . Rotator cuff repair  2006    Right   History  Substance Use Topics  . Smoking status: Never Smoker   . Smokeless tobacco: Never Used  . Alcohol Use: No   Family History  Problem Relation Age of Onset  . Diabetes Mother   . Hyperlipidemia Mother   . Hypertension Mother   . Stroke Mother   . Coronary artery disease Mother   . Heart disease Mother   . Diabetes Father   . Hypertension Father   . Hyperlipidemia Father   . Coronary artery disease Father   . Heart disease Father   . Prostate cancer Father   . Pancreatic cancer Father   . Heart disease Brother   . Diabetes Maternal Grandmother   . Diabetes Maternal Grandfather   . Diabetes Paternal Grandmother   . Diabetes Paternal Grandfather   . Kidney disease Neg Hx   . Cancer Neg Hx    Allergies  Allergen Reactions  . Erythromycin Other (See Comments)    Stomach cramps  . Lipitor [Atorvastatin] Nausea Only  . Penicillins Rash   Current Outpatient Prescriptions on File Prior to Visit  Medication Sig Dispense Refill  . citalopram (  CELEXA) 20 MG tablet Take 1 tablet (20 mg total) by mouth daily.  30 tablet  3  . diazepam (VALIUM) 5 MG tablet Take 1 tablet (5 mg total) by mouth every 8 (eight) hours as needed (dizziness).  15 tablet  0  . insulin glargine (LANTUS) 100 UNIT/ML injection Inject 60 Units into the skin 2 (two) times daily.       . insulin regular (NOVOLIN R,HUMULIN R) 100 units/mL injection Inject 20 Units into the skin 3 (three) times daily before meals.      Marland Kitchen lisinopril (ZESTRIL) 2.5 MG tablet Take 1 tablet (2.5 mg total) by mouth daily.  30 tablet  11  . meclizine (ANTIVERT) 25 MG tablet Take 1 tablet (25 mg total) by mouth 3 (three) times daily as needed.  30 tablet  0  . metFORMIN (GLUCOPHAGE) 1000 MG tablet Take 1 tablet (1,000 mg total) by mouth 2 (two) times daily with a meal.  60 tablet  5  . metoprolol tartrate  (LOPRESSOR) 25 MG tablet Take 25 mg by mouth daily. Take one-half of a tablet two times daily.      Marland Kitchen omeprazole (PRILOSEC) 20 MG capsule Take 20 mg by mouth daily.      . promethazine (PHENERGAN) 25 MG tablet Take 1 tablet (25 mg total) by mouth every 6 (six) hours as needed for nausea.  12 tablet  0   No current facility-administered medications on file prior to visit.     Review of Systems Per HPI    Objective:   Physical Exam  Nursing note and vitals reviewed. Constitutional: She is oriented to person, place, and time. She appears well-developed and well-nourished. No distress.  HENT:  Head: Normocephalic and atraumatic.  Right Ear: Hearing, tympanic membrane, external ear and ear canal normal.  Left Ear: Hearing, tympanic membrane, external ear and ear canal normal.  Nose: Nose normal.  Mouth/Throat: Oropharynx is clear and moist. No oropharyngeal exudate.  Eyes: Conjunctivae and EOM are normal. Pupils are equal, round, and reactive to light. No scleral icterus.  Neck: Normal range of motion. Neck supple. No thyromegaly present.  Cardiovascular: Normal rate, regular rhythm, normal heart sounds and intact distal pulses.   No murmur heard. Pulses:      Radial pulses are 2+ on the right side, and 2+ on the left side.  Pulmonary/Chest: Effort normal and breath sounds normal. No respiratory distress. She has no wheezes. She has no rales. Right breast exhibits no inverted nipple, no mass, no nipple discharge, no skin change and no tenderness. Left breast exhibits no inverted nipple, no mass, no nipple discharge, no skin change and no tenderness.  Abdominal: Soft. Bowel sounds are normal. She exhibits no distension and no mass. There is no tenderness. There is no rebound and no guarding.  Genitourinary: Vagina normal and uterus normal. Pelvic exam was performed with patient supine. There is no rash, tenderness, lesion or injury on the right labia. There is no rash, tenderness, lesion or  injury on the left labia. Cervix exhibits no motion tenderness, no discharge and no friability. Right adnexum displays no mass, no tenderness and no fullness. Left adnexum displays no mass, no tenderness and no fullness.  Difficult exam, cervix deep and to left  Musculoskeletal: Normal range of motion. She exhibits no edema.  Lymphadenopathy:       Head (right side): No submental, no submandibular, no tonsillar, no preauricular and no posterior auricular adenopathy present.       Head (left  side): No submental, no submandibular, no tonsillar, no preauricular and no posterior auricular adenopathy present.    She has no cervical adenopathy.    She has no axillary adenopathy.       Right axillary: No lateral adenopathy present.       Left axillary: No lateral adenopathy present.      Right: No supraclavicular and no epitrochlear adenopathy present.       Left: No supraclavicular adenopathy present.  Neurological: She is alert and oriented to person, place, and time.  CN grossly intact, station and gait intact  Skin: Skin is warm and dry. No rash noted.  Psychiatric: She has a normal mood and affect. Her behavior is normal. Judgment and thought content normal.       Assessment & Plan:

## 2013-06-10 NOTE — Assessment & Plan Note (Signed)
Chronic, stable. Continue metoprolol and lisinopril.

## 2013-06-10 NOTE — Patient Instructions (Signed)
Flu and pneumonia shots today. Stool kit today. Pelvic and pap smear today Breast exam today. I'd like to start you on cholesterol medicine called simvastatin - sent to pharmacy.  Let me know if you do not tolerate this medication. Work on increased water. Return in 2-3 months for diabetes follow up. Good to see you today, call us with questions.

## 2013-06-10 NOTE — Progress Notes (Signed)
Pre-visit discussion using our clinic review tool. No additional management support is needed unless otherwise documented below in the visit note.  

## 2013-06-11 ENCOUNTER — Telehealth: Payer: Self-pay | Admitting: *Deleted

## 2013-06-11 ENCOUNTER — Encounter: Payer: Self-pay | Admitting: *Deleted

## 2013-06-11 ENCOUNTER — Telehealth: Payer: Self-pay | Admitting: Family Medicine

## 2013-06-11 NOTE — Telephone Encounter (Signed)
Relevant patient education assigned to patient using Emmi. ° °

## 2013-06-11 NOTE — Telephone Encounter (Signed)
This may be response to flu shot, but is not the flu. [place] for note out of work. Continue tylenol, ibuprofen. Let us know if persistent sxs.

## 2013-06-11 NOTE — Telephone Encounter (Signed)
Patient called and said that she woke up with body aches, chills, scratchy ST and a temp of 100.1 this AM. Unknown if related to flu vaccine yesterday. She has been alternating tylenol and ibuprofen, sleeping and drinking plenty of fluids. Will need work note. Should she get tamiflu or just watch and wait?

## 2013-06-11 NOTE — Telephone Encounter (Signed)
Patient notified and letter written to be out of work until fever free for 24 hours. Faxed to work as requested by patient.

## 2013-06-12 ENCOUNTER — Telehealth: Payer: Self-pay

## 2013-06-12 NOTE — Telephone Encounter (Signed)
Relevant patient education assigned to patient using Emmi. ° °

## 2013-06-14 ENCOUNTER — Telehealth: Payer: Self-pay | Admitting: *Deleted

## 2013-06-14 ENCOUNTER — Encounter: Payer: Self-pay | Admitting: *Deleted

## 2013-06-14 NOTE — Telephone Encounter (Signed)
Late entry-Message left for patient yesterday to return my call and advise whether or not she has had a hysterectomy. Cytology called about PAP. Will await return call.

## 2013-07-14 HISTORY — PX: CARDIAC CATHETERIZATION: SHX172

## 2013-07-23 ENCOUNTER — Encounter (HOSPITAL_COMMUNITY): Payer: No Typology Code available for payment source

## 2013-07-23 ENCOUNTER — Encounter (HOSPITAL_COMMUNITY): Payer: Self-pay

## 2013-07-23 ENCOUNTER — Encounter (HOSPITAL_COMMUNITY): Payer: Self-pay | Admitting: Internal Medicine

## 2013-07-23 ENCOUNTER — Encounter (HOSPITAL_COMMUNITY)
Admission: EM | Disposition: A | Discharge status: Home / Self Care | Payer: Self-pay | Source: Home / Self Care | Attending: Interventional Cardiology

## 2013-07-23 ENCOUNTER — Inpatient Hospital Stay (HOSPITAL_COMMUNITY)
Admission: EM | Admit: 2013-07-23 | Discharge: 2013-07-25 | DRG: 247 | Disposition: A | Discharge status: Home / Self Care | Payer: No Typology Code available for payment source | Attending: Interventional Cardiology | Admitting: Interventional Cardiology

## 2013-07-23 ENCOUNTER — Emergency Department (HOSPITAL_COMMUNITY): Payer: No Typology Code available for payment source

## 2013-07-23 DIAGNOSIS — Z79899 Other long term (current) drug therapy: Secondary | ICD-10-CM

## 2013-07-23 DIAGNOSIS — I200 Unstable angina: Secondary | ICD-10-CM

## 2013-07-23 DIAGNOSIS — E781 Pure hyperglyceridemia: Secondary | ICD-10-CM | POA: Diagnosis present

## 2013-07-23 DIAGNOSIS — I2510 Atherosclerotic heart disease of native coronary artery without angina pectoris: Secondary | ICD-10-CM | POA: Diagnosis present

## 2013-07-23 DIAGNOSIS — F329 Major depressive disorder, single episode, unspecified: Secondary | ICD-10-CM | POA: Diagnosis present

## 2013-07-23 DIAGNOSIS — E1142 Type 2 diabetes mellitus with diabetic polyneuropathy: Secondary | ICD-10-CM | POA: Diagnosis present

## 2013-07-23 DIAGNOSIS — F3289 Other specified depressive episodes: Secondary | ICD-10-CM | POA: Diagnosis present

## 2013-07-23 DIAGNOSIS — IMO0002 Reserved for concepts with insufficient information to code with codable children: Secondary | ICD-10-CM

## 2013-07-23 DIAGNOSIS — Z9861 Coronary angioplasty status: Secondary | ICD-10-CM

## 2013-07-23 DIAGNOSIS — E1165 Type 2 diabetes mellitus with hyperglycemia: Secondary | ICD-10-CM | POA: Diagnosis present

## 2013-07-23 DIAGNOSIS — I219 Acute myocardial infarction, unspecified: Secondary | ICD-10-CM

## 2013-07-23 DIAGNOSIS — E1169 Type 2 diabetes mellitus with other specified complication: Secondary | ICD-10-CM | POA: Diagnosis present

## 2013-07-23 DIAGNOSIS — E118 Type 2 diabetes mellitus with unspecified complications: Secondary | ICD-10-CM | POA: Diagnosis present

## 2013-07-23 DIAGNOSIS — J42 Unspecified chronic bronchitis: Secondary | ICD-10-CM | POA: Diagnosis present

## 2013-07-23 DIAGNOSIS — I2119 ST elevation (STEMI) myocardial infarction involving other coronary artery of inferior wall: Secondary | ICD-10-CM | POA: Diagnosis present

## 2013-07-23 DIAGNOSIS — Z794 Long term (current) use of insulin: Secondary | ICD-10-CM

## 2013-07-23 DIAGNOSIS — E785 Hyperlipidemia, unspecified: Secondary | ICD-10-CM | POA: Diagnosis present

## 2013-07-23 DIAGNOSIS — I2109 ST elevation (STEMI) myocardial infarction involving other coronary artery of anterior wall: Principal | ICD-10-CM | POA: Diagnosis present

## 2013-07-23 DIAGNOSIS — I213 ST elevation (STEMI) myocardial infarction of unspecified site: Secondary | ICD-10-CM

## 2013-07-23 DIAGNOSIS — K219 Gastro-esophageal reflux disease without esophagitis: Secondary | ICD-10-CM | POA: Diagnosis present

## 2013-07-23 DIAGNOSIS — E1149 Type 2 diabetes mellitus with other diabetic neurological complication: Secondary | ICD-10-CM | POA: Diagnosis present

## 2013-07-23 DIAGNOSIS — E1140 Type 2 diabetes mellitus with diabetic neuropathy, unspecified: Secondary | ICD-10-CM

## 2013-07-23 DIAGNOSIS — I10 Essential (primary) hypertension: Secondary | ICD-10-CM | POA: Diagnosis present

## 2013-07-23 HISTORY — PX: LEFT HEART CATHETERIZATION WITH CORONARY ANGIOGRAM: SHX5451

## 2013-07-23 LAB — I-STAT CHEM 8, ED
BUN: 22 mg/dL (ref 6–23)
CHLORIDE: 101 meq/L (ref 96–112)
Calcium, Ion: 1.19 mmol/L (ref 1.12–1.23)
Creatinine, Ser: 0.6 mg/dL (ref 0.50–1.10)
Glucose, Bld: 450 mg/dL — ABNORMAL HIGH (ref 70–99)
HEMATOCRIT: 48 % — AB (ref 36.0–46.0)
HEMOGLOBIN: 16.3 g/dL — AB (ref 12.0–15.0)
POTASSIUM: 4.9 meq/L (ref 3.7–5.3)
SODIUM: 135 meq/L — AB (ref 137–147)
TCO2: 25 mmol/L (ref 0–100)

## 2013-07-23 LAB — CBC
HCT: 43.8 % (ref 36.0–46.0)
Hemoglobin: 15.4 g/dL — ABNORMAL HIGH (ref 12.0–15.0)
MCH: 29.1 pg (ref 26.0–34.0)
MCHC: 35.2 g/dL (ref 30.0–36.0)
MCV: 82.6 fL (ref 78.0–100.0)
Platelets: 202 10*3/uL (ref 150–400)
RBC: 5.3 MIL/uL — AB (ref 3.87–5.11)
RDW: 13.4 % (ref 11.5–15.5)
WBC: 6.8 10*3/uL (ref 4.0–10.5)

## 2013-07-23 LAB — BASIC METABOLIC PANEL
BUN: 18 mg/dL (ref 6–23)
CHLORIDE: 94 meq/L — AB (ref 96–112)
CO2: 21 meq/L (ref 19–32)
CREATININE: 0.49 mg/dL — AB (ref 0.50–1.10)
Calcium: 9.7 mg/dL (ref 8.4–10.5)
GFR calc non Af Amer: >90 mL/min (ref >90)
Glucose, Bld: 429 mg/dL — ABNORMAL HIGH (ref 70–99)
POTASSIUM: 4.9 meq/L (ref 3.7–5.3)
Sodium: 131 mEq/L — ABNORMAL LOW (ref 137–147)

## 2013-07-23 LAB — HEPATIC FUNCTION PANEL
ALT: <5 U/L (ref 0–35)
AST: <5 U/L (ref 0–37)
Albumin: 3.6 g/dL (ref 3.5–5.2)
Alkaline Phosphatase: 101 U/L (ref 39–117)
Bilirubin, Direct: <0.2 mg/dL (ref 0.0–0.3)
Total Bilirubin: 0.3 mg/dL (ref 0.3–1.2)
Total Protein: 7.4 g/dL (ref 6.0–8.3)

## 2013-07-23 LAB — APTT: aPTT: 50 seconds — ABNORMAL HIGH (ref 24–37)

## 2013-07-23 LAB — PROTIME-INR
INR: 0.93 (ref 0.00–1.49)
Prothrombin Time: 12.3 seconds (ref 11.6–15.2)

## 2013-07-23 LAB — GLUCOSE, CAPILLARY: GLUCOSE-CAPILLARY: 374 mg/dL — AB (ref 70–99)

## 2013-07-23 LAB — I-STAT TROPONIN, ED: Troponin i, poc: 0.05 ng/mL (ref 0.00–0.08)

## 2013-07-23 LAB — MRSA PCR SCREENING: MRSA by PCR: NEGATIVE

## 2013-07-23 SURGERY — LEFT HEART CATHETERIZATION WITH CORONARY ANGIOGRAM
Anesthesia: LOCAL

## 2013-07-23 SURGERY — LEFT HEART CATH
Anesthesia: LOCAL | Laterality: Bilateral

## 2013-07-23 MED ORDER — INSULIN ASPART 100 UNIT/ML ~~LOC~~ SOLN
14.0000 [IU] | Freq: Three times a day (TID) | SUBCUTANEOUS | Status: DC
Start: 2013-07-24 — End: 2013-07-25
  Administered 2013-07-24 – 2013-07-25 (×3): 14 [IU] via SUBCUTANEOUS

## 2013-07-23 MED ORDER — ASPIRIN EC 81 MG PO TBEC
81.0000 mg | DELAYED_RELEASE_TABLET | Freq: Every day | ORAL | Status: DC
  Administered 2013-07-24 – 2013-07-25 (×2): 81 mg via ORAL
  Filled 2013-07-23 (×2): qty 1

## 2013-07-23 MED ORDER — HEPARIN SODIUM (PORCINE) 5000 UNIT/ML IJ SOLN
5000.0000 [IU] | Freq: Three times a day (TID) | INTRAMUSCULAR | Status: DC
Start: 2013-07-24 — End: 2013-07-23
  Filled 2013-07-23: qty 1

## 2013-07-23 MED ORDER — HEPARIN SODIUM (PORCINE) 5000 UNIT/ML IJ SOLN
4000.0000 [IU] | INTRAMUSCULAR | Status: AC
  Administered 2013-07-23: 4000 [IU] via INTRAVENOUS
  Filled 2013-07-23: qty 1

## 2013-07-23 MED ORDER — SODIUM CHLORIDE 0.9 % IJ SOLN
3.0000 mL | Freq: Two times a day (BID) | INTRAMUSCULAR | Status: DC
  Administered 2013-07-23: 3 mL via INTRAVENOUS

## 2013-07-23 MED ORDER — SODIUM CHLORIDE 0.9 % IJ SOLN: 3.0000 mL | INTRAMUSCULAR | Status: DC | PRN

## 2013-07-23 MED ORDER — PANTOPRAZOLE SODIUM 40 MG PO TBEC
40.0000 mg | DELAYED_RELEASE_TABLET | Freq: Every day | ORAL | Status: DC
  Administered 2013-07-24 – 2013-07-25 (×2): 40 mg via ORAL
  Filled 2013-07-23 (×2): qty 1

## 2013-07-23 MED ORDER — TICAGRELOR 90 MG PO TABS
90.0000 mg | ORAL_TABLET | Freq: Two times a day (BID) | ORAL | Status: DC
  Administered 2013-07-24 – 2013-07-25 (×3): 90 mg via ORAL
  Filled 2013-07-23 (×4): qty 1

## 2013-07-23 MED ORDER — TICAGRELOR 90 MG PO TABS
ORAL_TABLET | ORAL | Status: AC
  Administered 2013-07-24: 90 mg via ORAL
  Filled 2013-07-23: qty 2

## 2013-07-23 MED ORDER — ENOXAPARIN SODIUM 40 MG/0.4ML ~~LOC~~ SOLN
40.0000 mg | SUBCUTANEOUS | Status: DC
  Administered 2013-07-24: 40 mg via SUBCUTANEOUS
  Filled 2013-07-23: qty 0.4

## 2013-07-23 MED ORDER — NITROGLYCERIN 0.4 MG SL SUBL
0.4000 mg | SUBLINGUAL_TABLET | SUBLINGUAL | Status: DC | PRN
  Administered 2013-07-23: 0.4 mg via SUBLINGUAL
  Filled 2013-07-23: qty 1

## 2013-07-23 MED ORDER — SODIUM CHLORIDE 0.9 % IV SOLN
INTRAVENOUS | Status: DC
  Administered 2013-07-23: 10 mL/h via INTRAVENOUS

## 2013-07-23 MED ORDER — TIROFIBAN (AGGRASTAT) BOLUS VIA INFUSION
25.0000 ug/kg | Freq: Once | INTRAVENOUS | Status: AC
Start: 2013-07-23 — End: 2013-07-23
  Administered 2013-07-23: 2060 ug via INTRAVENOUS
  Filled 2013-07-23: qty 42

## 2013-07-23 MED ORDER — MORPHINE SULFATE 2 MG/ML IJ SOLN
2.0000 mg | INTRAMUSCULAR | Status: DC | PRN
Start: 2013-07-23 — End: 2013-07-25
  Administered 2013-07-23: 2 mg via INTRAVENOUS
  Filled 2013-07-23: qty 1

## 2013-07-23 MED ORDER — METOPROLOL TARTRATE 25 MG PO TABS
25.0000 mg | ORAL_TABLET | Freq: Two times a day (BID) | ORAL | Status: DC
  Administered 2013-07-24 – 2013-07-25 (×4): 25 mg via ORAL
  Filled 2013-07-23 (×6): qty 1

## 2013-07-23 MED ORDER — SODIUM CHLORIDE 0.9 % IV SOLN
INTRAVENOUS | Status: DC
  Administered 2013-07-23: 23:00:00 via INTRAVENOUS

## 2013-07-23 MED ORDER — METOPROLOL TARTRATE 25 MG PO TABS: 12.5000 mg | ORAL_TABLET | Freq: Two times a day (BID) | ORAL | Status: DC

## 2013-07-23 MED ORDER — ASPIRIN 325 MG PO TABS
325.0000 mg | ORAL_TABLET | ORAL | Status: AC
  Administered 2013-07-23: 325 mg via ORAL
  Filled 2013-07-23: qty 1

## 2013-07-23 MED ORDER — TIROFIBAN HCL IV 5 MG/100ML
0.1500 ug/kg/min | INTRAVENOUS | Status: DC
  Administered 2013-07-23 – 2013-07-24 (×2): 0.15 ug/kg/min via INTRAVENOUS
  Filled 2013-07-23 (×4): qty 100

## 2013-07-23 MED ORDER — CITALOPRAM HYDROBROMIDE 20 MG PO TABS
20.0000 mg | ORAL_TABLET | Freq: Every day | ORAL | Status: DC
  Administered 2013-07-24 – 2013-07-25 (×2): 20 mg via ORAL
  Filled 2013-07-23 (×2): qty 1

## 2013-07-23 MED ORDER — TICAGRELOR 90 MG PO TABS: 90.0000 mg | ORAL_TABLET | Freq: Two times a day (BID) | ORAL | Status: DC

## 2013-07-23 MED ORDER — FENTANYL CITRATE 0.05 MG/ML IJ SOLN
INTRAMUSCULAR | Status: AC
  Filled 2013-07-23: qty 2

## 2013-07-23 MED ORDER — ONDANSETRON HCL 4 MG/2ML IJ SOLN
4.0000 mg | Freq: Four times a day (QID) | INTRAMUSCULAR | Status: DC | PRN
  Filled 2013-07-23: qty 2

## 2013-07-23 MED ORDER — INSULIN GLARGINE 100 UNIT/ML ~~LOC~~ SOLN
45.0000 [IU] | Freq: Two times a day (BID) | SUBCUTANEOUS | Status: DC
  Administered 2013-07-23 – 2013-07-25 (×3): 45 [IU] via SUBCUTANEOUS
  Filled 2013-07-23 (×5): qty 0.45

## 2013-07-23 MED ORDER — NITROGLYCERIN IN D5W 200-5 MCG/ML-% IV SOLN
20.0000 ug/min | INTRAVENOUS | Status: DC
  Administered 2013-07-23: 40 ug/min via INTRAVENOUS
  Administered 2013-07-24: 35 ug/min via INTRAVENOUS
  Filled 2013-07-23: qty 250

## 2013-07-23 MED ORDER — MIDAZOLAM HCL 2 MG/2ML IJ SOLN
INTRAMUSCULAR | Status: AC
  Filled 2013-07-23: qty 2

## 2013-07-23 MED ORDER — ATORVASTATIN CALCIUM 80 MG PO TABS
80.0000 mg | ORAL_TABLET | Freq: Every day | ORAL | Status: DC
  Administered 2013-07-24: 80 mg via ORAL
  Filled 2013-07-23 (×2): qty 1

## 2013-07-23 MED ORDER — SODIUM CHLORIDE 0.9 % IV SOLN: 250.0000 mL | INTRAVENOUS | Status: DC | PRN

## 2013-07-23 MED ORDER — NITROGLYCERIN IN D5W 200-5 MCG/ML-% IV SOLN
INTRAVENOUS | Status: AC
  Filled 2013-07-23: qty 250

## 2013-07-23 MED ORDER — BIVALIRUDIN 250 MG IV SOLR
INTRAVENOUS | Status: AC
  Filled 2013-07-23: qty 250

## 2013-07-23 MED ORDER — ACETAMINOPHEN 325 MG PO TABS: 650.0000 mg | ORAL_TABLET | ORAL | Status: DC | PRN

## 2013-07-23 MED ORDER — LISINOPRIL 2.5 MG PO TABS
2.5000 mg | ORAL_TABLET | Freq: Every day | ORAL | Status: DC
  Administered 2013-07-24 – 2013-07-25 (×2): 2.5 mg via ORAL
  Filled 2013-07-23 (×2): qty 1

## 2013-07-23 MED ORDER — NITROGLYCERIN IN D5W 200-5 MCG/ML-% IV SOLN
20.0000 ug/min | INTRAVENOUS | Status: DC
  Administered 2013-07-23: 20 ug/min via INTRAVENOUS

## 2013-07-23 NOTE — ED Notes (Signed)
Dr. Smith in to see patient

## 2013-07-23 NOTE — ED Notes (Signed)
Patient to the cath lab.  Dr. Tamala Julian with this nurse and the patient

## 2013-07-23 NOTE — ED Notes (Signed)
Patient stated that she has been having CP for a 2-3 days and today it got worse.   Stated she went to [facility]

## 2013-07-23 NOTE — ED Notes (Signed)
Pt reports mid chest pain started three days ago. Pt describes the pain as pressure and burning radiating to shoulder blades. Pt denies diaphoresis, n/v, arm pain, jaw pain. Pt is A&Ox4, respirations equal and unlabored skin warm and dry. Pt denies taking aspirin. Pt does not have a cardiologist.

## 2013-07-23 NOTE — Progress Notes (Signed)
ANTICOAGULATION CONSULT NOTE - Initial Consult  Pharmacy Consult for aggrastat   Indication: inferior and anterior STEMI with PTCA  Allergies  Allergen Reactions  . Erythromycin Other (See Comments)    Stomach cramps  . Lipitor [Atorvastatin] Nausea Only  . Penicillins Rash    Patient Measurements: Height: 5\' 1"  (154.9 cm) Weight: 181 lb 11.2 oz (82.419 kg) IBW/kg (Calculated) : 47.8 Heparin Dosing Weight:   Vital Signs: Temp: 98 F (36.7 C) (03/10 1950) Temp src: Oral (03/10 1950) BP: 128/61 mmHg (03/10 2026) Pulse Rate: 98 (03/10 2044)  Labs:  Recent Labs  07/23/13 1954 07/23/13 2023  HGB 15.4* 16.3*  HCT 43.8 48.0*  PLT 202  --   APTT 50*  --   LABPROT 12.3  --   INR 0.93  --   CREATININE 0.49* 0.60    Estimated Creatinine Clearance: 77.3 ml/min (by C-G formula based on Cr of 0.6).   Medical History: Past Medical History  Diagnosis Date  . History of chicken pox   . Depression   . Type 2 diabetes, uncontrolled, with neuropathy     dx 1993  . Chronic bronchitis   . Syncopal episodes   . GERD (gastroesophageal reflux disease)   . Seasonal allergies   . High blood pressure   . HLD (hyperlipidemia)   . Seizure 1977    off AEDs  . History of ovarian cyst   . Tubal pregnancy   . Tachyarrhythmia     ER visits with adenosine push to control    Medications:  Prescriptions prior to admission  Medication Sig Dispense Refill  . acetaminophen (TYLENOL) 500 MG tablet Take 1,000 mg by mouth every 6 (six) hours as needed.      . citalopram (CELEXA) 20 MG tablet Take 1 tablet (20 mg total) by mouth daily.  30 tablet  3  . diazepam (VALIUM) 5 MG tablet Take 1 tablet (5 mg total) by mouth every 8 (eight) hours as needed (dizziness).  15 tablet  0  . ibuprofen (ADVIL,MOTRIN) 200 MG tablet Take 600-800 mg by mouth every 6 (six) hours as needed.      . insulin glargine (LANTUS) 100 UNIT/ML injection Inject 60 Units into the skin 2 (two) times daily.       .  insulin regular (NOVOLIN R,HUMULIN R) 100 units/mL injection Inject 20 Units into the skin 3 (three) times daily before meals.      Marland Kitchen lisinopril (ZESTRIL) 2.5 MG tablet Take 1 tablet (2.5 mg total) by mouth daily.  30 tablet  11  . meclizine (ANTIVERT) 25 MG tablet Take 1 tablet (25 mg total) by mouth 3 (three) times daily as needed.  30 tablet  0  . metFORMIN (GLUCOPHAGE) 1000 MG tablet Take 1 tablet (1,000 mg total) by mouth 2 (two) times daily with a meal.  60 tablet  5  . metoprolol tartrate (LOPRESSOR) 25 MG tablet Take 12.5 mg by mouth 2 (two) times daily. Take one-half of a tablet two times daily.      Marland Kitchen omeprazole (PRILOSEC) 20 MG capsule Take 20 mg by mouth daily.      . promethazine (PHENERGAN) 25 MG tablet Take 1 tablet (25 mg total) by mouth every 6 (six) hours as needed for nausea.  12 tablet  0  . simvastatin (ZOCOR) 40 MG tablet Take 1 tablet (40 mg total) by mouth daily.  30 tablet  11    Assessment: . Successful PTCA of the right coronary with reduction in  100% stenosis to less than 50% with TIMI grade 3 flow. Patient started on angiomax in cath lab. Transition to aggrastat post cath with bolus per MD.     Goal of Therapy:    Monitor platelets by anticoagulation protocol: Yes    Plan:  aggrastat 35mcg/kg bolus then 0.67mcg/kg/min. Platelets in 6 hours.   Curlene Dolphin 07/23/2013,10:44 PM

## 2013-07-23 NOTE — CV Procedure (Addendum)
Left Heart Catheterization with Coronary Angiography and PTCA Report  ICY FUHRMANN  58 y.o.  female Jan 30, 1956  Procedure Date: 07/23/2013  Referring Physician: Larence Penning ED Primary Cardiologist: HWBSmith, III, MD  INDICATIONS: Inferior and anterior STEMI. The initial ECG at 1948 was non-diagnostic for STEMI. A repeat tracing at 20:06 revealed diagnostic criteria for anterior injury/STEMI.  PROCEDURE: 1. Left heart; 2. Left ventriculogram; 3. Coronary angiography; 4. PTCA RCA  CONSENT:  The risks, benefits, and details of the procedure were explained in detail to the patient. Risks including death, stroke, heart attack, kidney injury, allergy, limb ischemia, bleeding and radiation injury were discussed.  The patient verbalized understanding and wanted to proceed.  Informed written consent was obtained.  PROCEDURE TECHNIQUE:  After Xylocaine anesthesia a 5 French Slender sheath was placed in the right radial artery using an Angiocath and the Seldinger technique. Coronary angiography was done using a 5 [place] JR 4 and EBU 3.0 catheters.  Left ventriculography was done using the JR 4 catheter and hand injection.   Angiography demonstrated total occlusion of the mid right coronary. There was also noted to be a high-grade obstruction in a ramus intermedius branch in the proximal segment extending back to the ostium. We decided that the right coronary was the culprit lesion despite the fact that one of the EKGs demonstrated transient ST elevation with hyperacute T waves in V1 through V4. This is presumably related to the ramus intermedius branch.  A bivalirudin bolus and infusion was started. Brilinta 180 mg was given orally. An A CT was documented to be greater than 300 seconds. An Furniture conservator/restorer was used to cross the stenosis in the right coronary. Immediate reperfusion occurred with wire crossing the lesion. We then used a 1.5 mm diameter followed by a 2.0 mm diameter balloon to  reestablish  TIMI grade 3 blood flow. With the vessel open we will eyes at the right coronary was a small nondominant vessel. I chose not to stent the vessel. There was 30-40% residual stenosis left post dilatation.  MEDICATIONS: Versed 2 mg; fentanyl 100 mcg  CONTRAST:  Total of 245 cc.  COMPLICATIONS:  none   HEMODYNAMICS:  Aortic pressure 197/93 mmHg; LV pressure 196/3 mmHg; LVEDP 9 mmHg  ANGIOGRAPHIC DATA:   The left main coronary artery is widely patent.  The left anterior descending artery is widely patent and transapical. The vessels.diffusely diseased in the apical segment.  The ramus intermedius contains an ostial to proximal 95% stenosis. The vessel is large in distribution and trifurcates distally.  The left circumflex artery is dominant. He gives origin to 4 obtuse marginal branches and the posterior descending coronary artery. No significant obstruction is noted. The first marginal contains ostial 30-40% narrowing  The right coronary artery is totally occluded in the midsegment. The vessel is obviously a nondominant vessel given the size of the circumflex artery.Marland Kitchen   PCI RESULTS: totally occluded mid right coronary reduced to less than 40% after angioplasty. Restoration of TIMI grade 3 flow was noted  LEFT VENTRICULOGRAM:  Left ventricular angiogram was done in the 30 RAO projection and revealed 55% without significant regional wall motion abnormality.   IMPRESSIONS:  1. Acute inferior ST elevation myocardial infarction involving a nondominant right coronary artery.  2. Successful PTCA of the right coronary with reduction in 100% stenosis to less than 50% with TIMI grade 3 flow.  3. High-grade obstruction a large ramus intermedius branch ostial to proximal segment. The patient's initial EKG demonstrated  mid anterior wall ST elevation that was likely related to transient occlusion of the ramus intermedius branch.  4. Widely patent LAD and circumflex.  5. Overall normal  LV function     RECOMMENDATION:    Ramus intermedius PCI on Friday a.m. Aggressive medical therapy risk factors(beta blocker, statin therapy, diabetes control, aspirin, and Brilinta) Phase I cardiac rehabilitation Transfer to telemetry in a.m and then precipitated discharge on 09/26/13,  AM.

## 2013-07-23 NOTE — ED Notes (Signed)
Notified RN of elevated chem 8 results.

## 2013-07-23 NOTE — H&P (Signed)
History and Physical   Patient ID: Karen [name] MRN: [id number], DOB/AGE: 1955-09-19   Admit date: 07/23/2013 Date of Consult: 07/23/2013   Primary Physician: [name], MD Primary Cardiologist: Rolland Porter, now Dr. Linard Millers  HPI: Karen [name] is a 58 y.o. female WF with PMHx of long-standing IDDM, HTN, HLD, depression, h/o seizures who follows routinely with [facility].  She denies any prior h/o CAD/MI/stents/CABG/arrhythmia.  She presents to the [facility] ED this evening with increasing chest pressure.  She reports several nebulous chest discomfort episodes over the last few weeks which varies in character and intensity and has no clear triggers or associations.  These episodes are sometimes accompanied with dyspnea.  Today her symptoms persisted and were worse.  On presentation to the ED, she had a 12-lead ECG showing NSR with anterior STE in V1-V3 as well as STE in III, aVF.  In the ED, she received Heparin 4,000 units + Aspirin 324 mg chewable + NTG-SL.  She was taken emergently to the cardiac cath lab as a code STEMI.  Problem List: Past Medical History  Diagnosis Date  . History of chicken pox   . Depression   . Type 2 diabetes, uncontrolled, with neuropathy     dx 1993  . Chronic bronchitis   . Syncopal episodes   . GERD (gastroesophageal reflux disease)   . Seasonal allergies   . High blood pressure   . HLD (hyperlipidemia)   . Seizure 1977    off AEDs  . History of ovarian cyst   . Tubal pregnancy   . Tachyarrhythmia     ER visits with adenosine push to control    Past Surgical History  Procedure Laterality Date  . Tonsillectomy and adenoidectomy  1965  . Ovarian cyst surgery  1998  . Dilation and curettage of uterus  1984  . Ectopic pregnancy surgery Right 1984    tube removal 2/2 hemorrhage  . Rotator cuff repair  2006    Right     Allergies:  Allergies  Allergen Reactions  . Erythromycin Other (See Comments)    Stomach cramps  . Lipitor  [Atorvastatin] Nausea Only  . Penicillins Rash    Home Medications: Prior to Admission medications   Medication Sig Start Date End Date Taking? Authorizing Provider  acetaminophen (TYLENOL) 500 MG tablet Take 1,000 mg by mouth every 6 (six) hours as needed.    Historical Provider, MD  citalopram (CELEXA) 20 MG tablet Take 1 tablet (20 mg total) by mouth daily. 04/25/13   [name], MD  diazepam (VALIUM) 5 MG tablet Take 1 tablet (5 mg total) by mouth every 8 (eight) hours as needed (dizziness). 04/09/13   Tatyana A Kirichenko, PA-C  ibuprofen (ADVIL,MOTRIN) 200 MG tablet Take 600-800 mg by mouth every 6 (six) hours as needed.    Historical Provider, MD  insulin glargine (LANTUS) 100 UNIT/ML injection Inject 60 Units into the skin 2 (two) times daily.     Historical Provider, MD  insulin regular (NOVOLIN R,HUMULIN R) 100 units/mL injection Inject 20 Units into the skin 3 (three) times daily before meals.    Historical Provider, MD  lisinopril (ZESTRIL) 2.5 MG tablet Take 1 tablet (2.5 mg total) by mouth daily. 04/25/13   [name], MD  meclizine (ANTIVERT) 25 MG tablet Take 1 tablet (25 mg total) by mouth 3 (three) times daily as needed. 04/09/13   Tatyana A Kirichenko, PA-C  metFORMIN (GLUCOPHAGE) 1000 MG tablet Take 1 tablet (1,000 mg total)  by mouth 2 (two) times daily with a meal. 04/01/13   [name], MD  metoprolol tartrate (LOPRESSOR) 25 MG tablet Take 25 mg by mouth daily. Take one-half of a tablet two times daily. 04/01/13   [name], MD  omeprazole (PRILOSEC) 20 MG capsule Take 20 mg by mouth daily. 08/30/10   [name], MD  promethazine (PHENERGAN) 25 MG tablet Take 1 tablet (25 mg total) by mouth every 6 (six) hours as needed for nausea. 12/08/12   Kaitlyn Szekalski, PA-C  simvastatin (ZOCOR) 40 MG tablet Take 1 tablet (40 mg total) by mouth daily. 06/10/13   [name], MD    Inpatient Medications:    Prescriptions prior to admission    Medication Sig Dispense Refill  . acetaminophen (TYLENOL) 500 MG tablet Take 1,000 mg by mouth every 6 (six) hours as needed.      . citalopram (CELEXA) 20 MG tablet Take 1 tablet (20 mg total) by mouth daily.  30 tablet  3  . diazepam (VALIUM) 5 MG tablet Take 1 tablet (5 mg total) by mouth every 8 (eight) hours as needed (dizziness).  15 tablet  0  . ibuprofen (ADVIL,MOTRIN) 200 MG tablet Take 600-800 mg by mouth every 6 (six) hours as needed.      . insulin glargine (LANTUS) 100 UNIT/ML injection Inject 60 Units into the skin 2 (two) times daily.       . insulin regular (NOVOLIN R,HUMULIN R) 100 units/mL injection Inject 20 Units into the skin 3 (three) times daily before meals.      Marland Kitchen lisinopril (ZESTRIL) 2.5 MG tablet Take 1 tablet (2.5 mg total) by mouth daily.  30 tablet  11  . meclizine (ANTIVERT) 25 MG tablet Take 1 tablet (25 mg total) by mouth 3 (three) times daily as needed.  30 tablet  0  . metFORMIN (GLUCOPHAGE) 1000 MG tablet Take 1 tablet (1,000 mg total) by mouth 2 (two) times daily with a meal.  60 tablet  5  . metoprolol tartrate (LOPRESSOR) 25 MG tablet Take 25 mg by mouth daily. Take one-half of a tablet two times daily.      Marland Kitchen omeprazole (PRILOSEC) 20 MG capsule Take 20 mg by mouth daily.      . promethazine (PHENERGAN) 25 MG tablet Take 1 tablet (25 mg total) by mouth every 6 (six) hours as needed for nausea.  12 tablet  0  . simvastatin (ZOCOR) 40 MG tablet Take 1 tablet (40 mg total) by mouth daily.  30 tablet  11    Family History  Problem Relation Age of Onset  . Diabetes Mother   . Hyperlipidemia Mother   . Hypertension Mother   . Stroke Mother   . Coronary artery disease Mother     CABG  . Heart disease Mother   . Diabetes Father   . Hypertension Father   . Hyperlipidemia Father   . Coronary artery disease Father     CABG  . Heart disease Father     CHF  . Pancreatic cancer Father   . Heart disease Brother     arrhythmia  . Diabetes Maternal  Grandmother   . Diabetes Maternal Grandfather   . Diabetes Paternal Grandmother   . Diabetes Paternal Grandfather   . Kidney disease Neg Hx      History   Social History  . Marital Status: Widowed    Spouse Name: N/A    Number of Children: 0  . Years of Education: 12th  grade   Occupational History  . ALF, [employer], med tech/PCA    Social History Main Topics  . Smoking status: Never Smoker   . Smokeless tobacco: Never Used  . Alcohol Use: No  . Drug Use: No  . Sexual Activity: Not on file   Other Topics Concern  . Not on file   Social History Narrative   Caffeine: 6 diet sodas/day   Lives with 2 dogs.   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  Physical Exam: Blood pressure 128/61, pulse 75, temperature 98 F (36.7 C), temperature source Oral, resp. rate 18, height 5\' 1"  (1.549 m), weight 82.419 kg (181 lb 11.2 oz), SpO2 95.00%.  General: Well developed, well nourished, in no acute distress. Head: Normocephalic, atraumatic, sclera non-icteric, no xanthomas, nares are without discharge.  Neck: Negative for carotid bruits. JVD not elevated. Lungs: Clear bilaterally to auscultation without wheezes, rales, or rhonchi. Breathing is unlabored. Heart: RRR with S1 S2. No murmurs, rubs, or gallops appreciated. Abdomen: Soft, obese, non-tender, non-distended with normoactive bowel sounds. No hepatomegaly. No rebound/guarding. No obvious abdominal masses. Msk:  Strength and tone appears normal for age. Extremities: No clubbing, cyanosis or edema.  Distal pedal pulses are 2+ and equal bilaterally. Neuro: Alert and oriented X 3. Moves all extremities spontaneously. Psych:  Responds to questions appropriately with a normal affect.  Labs: Recent Labs     07/23/13  1954  07/23/13  2023  WBC  6.8   --     HGB  15.4*  16.3*  HCT  43.8  48.0*  MCV  82.6   --   PLT  202   --    No results found for this basename: VITAMINB12, FOLATE, FERRITIN, TIBC, IRON, RETICCTPCT,  in the last 72 hours No results found for this basename: DDIMER,  in the last 72 hours  Recent Labs Lab 07/23/13 1954 07/23/13 2023  NA 131* 135*  K PENDING 4.9  CL 94* 101  CO2 21  --   BUN 18 22  CREATININE 0.49* 0.60  CALCIUM 9.7  --   PROT 7.4  --   BILITOT 0.3  --   ALKPHOS 101  --   ALT PENDING  --   AST PENDING  --   GLUCOSE 429* 450*   No results found for this basename: HGBA1C,  in the last 72 hours No results found for this basename: CKTOTAL, CKMB, CKMBINDEX, TROPONINI,  in the last 72 hours No components found with this basename: POCBNP,  No results found for this basename: CHOL, HDL, LDLCALC, TRIG, CHOLHDL, LDLDIRECT,  in the last 72 hours No results found for this basename: TSH, T4TOTAL, FREET3, T3FREE, THYROIDAB,  in the last 72 hours  Radiology/Studies: Dg Chest Port 1 View  07/23/2013   CLINICAL DATA Mid chest pain, hypertension, tachycardia, history diabetes, hypertension  EXAM PORTABLE CHEST - 1 VIEW  COMPARISON Portable exam 2020 hr compared to 08/28/2010  FINDINGS Normal heart size, mediastinal contours, and pulmonary vascularity.  Minimal linear subsegmental atelectasis at left base.  Lungs otherwise clear.  No pleural effusion or pneumothorax.  No acute osseous findings.  IMPRESSION Minimal subsegmental atelectasis left base.  SIGNATURE  Electronically Signed   By: Lavonia Dana M.D.   On: 07/23/2013 20:35    EKG: NSR with anterior  STE in V1-V3 as well as STE in III, aVF.  Normal conduction intervals.  ASSESSMENT:  58 yo WF with PMHx of long-standing IDDM, HTN, HLD, depression, h/o seizures who follows routinely with [facility].  She denies any prior h/o CAD/MI/stents/CABG/arrhythmia.  She presents [facility] ED this evening with increasing chest pressure.  Signs, symptoms and objective  findings concerning for STE-ACS.    PLAN:  1-Emergent cardiac catheterization.  See final report. 2-Admit to [facility] service of Dr. Linard Millers, CCU level of care post-procedure/PCI. 3-CAD s/p STEMI:  DAPT with aspirin+ticagrelor 180mg  followed by maintenance dose of 90mg  BID starting in AM of 07/24/13.  Will start low dose BB, ACE-inhibitor (as HR/BP can tolerate) and high dose statin.  Check 2D echo in AM.  Continue to cycle troponins.  Patient will require staged PCI of Diagonal branch prior to discharge if stable. 4-IDDM:  Check HbA1C, SSI.  Diabetic diet. 5-HTN:  As above. 6-DVT PPx and PUD PPx.  Code Status:  FULL CODE.  Signed, Charlton Haws, MD [facility] Physician  07/23/2013, 9:22 PM

## 2013-07-24 ENCOUNTER — Encounter: Payer: Self-pay | Admitting: Family Medicine

## 2013-07-24 ENCOUNTER — Encounter (HOSPITAL_COMMUNITY)
Admission: EM | Disposition: A | Discharge status: Home / Self Care | Payer: No Typology Code available for payment source | Source: Home / Self Care | Attending: Interventional Cardiology

## 2013-07-24 ENCOUNTER — Ambulatory Visit (HOSPITAL_COMMUNITY): Admit: 2013-07-24 | Payer: Self-pay | Admitting: Interventional Cardiology

## 2013-07-24 DIAGNOSIS — E785 Hyperlipidemia, unspecified: Secondary | ICD-10-CM

## 2013-07-24 DIAGNOSIS — I517 Cardiomegaly: Secondary | ICD-10-CM

## 2013-07-24 DIAGNOSIS — I10 Essential (primary) hypertension: Secondary | ICD-10-CM

## 2013-07-24 DIAGNOSIS — I200 Unstable angina: Secondary | ICD-10-CM

## 2013-07-24 HISTORY — PX: PERCUTANEOUS CORONARY STENT INTERVENTION (PCI-S): SHX5485

## 2013-07-24 LAB — HEMOGLOBIN A1C
HEMOGLOBIN A1C: 10.9 % — AB (ref <5.7)
Mean Plasma Glucose: 266 mg/dL — ABNORMAL HIGH (ref <117)

## 2013-07-24 LAB — GLUCOSE, CAPILLARY
GLUCOSE-CAPILLARY: 264 mg/dL — AB (ref 70–99)
GLUCOSE-CAPILLARY: 233 mg/dL — AB (ref 70–99)
GLUCOSE-CAPILLARY: 217 mg/dL — AB (ref 70–99)
Glucose-Capillary: 251 mg/dL — ABNORMAL HIGH (ref 70–99)
Glucose-Capillary: 216 mg/dL — ABNORMAL HIGH (ref 70–99)

## 2013-07-24 LAB — CBC WITH DIFFERENTIAL/PLATELET
BASOS ABS: 0 10*3/uL (ref 0.0–0.1)
Basophils Relative: 0 % (ref 0–1)
EOS ABS: 0.1 10*3/uL (ref 0.0–0.7)
EOS PCT: 1 % (ref 0–5)
HCT: 38 % (ref 36.0–46.0)
Hemoglobin: 13.4 g/dL (ref 12.0–15.0)
LYMPHS ABS: 1.7 10*3/uL (ref 0.7–4.0)
Lymphocytes Relative: 24 % (ref 12–46)
MCH: 28.9 pg (ref 26.0–34.0)
MCHC: 35.3 g/dL (ref 30.0–36.0)
MCV: 82.1 fL (ref 78.0–100.0)
Monocytes Absolute: 0.4 10*3/uL (ref 0.1–1.0)
Monocytes Relative: 5 % (ref 3–12)
Neutro Abs: 5 10*3/uL (ref 1.7–7.7)
Neutrophils Relative %: 70 % (ref 43–77)
PLATELETS: 179 10*3/uL (ref 150–400)
RBC: 4.63 MIL/uL (ref 3.87–5.11)
RDW: 13.6 % (ref 11.5–15.5)
WBC: 7.2 10*3/uL (ref 4.0–10.5)

## 2013-07-24 LAB — BASIC METABOLIC PANEL
BUN: 15 mg/dL (ref 6–23)
CALCIUM: 9.3 mg/dL (ref 8.4–10.5)
CO2: 21 mEq/L (ref 19–32)
CREATININE: 0.49 mg/dL — AB (ref 0.50–1.10)
Chloride: 96 mEq/L (ref 96–112)
Glucose, Bld: 369 mg/dL — ABNORMAL HIGH (ref 70–99)
Potassium: 4.6 mEq/L (ref 3.7–5.3)
Sodium: 133 mEq/L — ABNORMAL LOW (ref 137–147)

## 2013-07-24 LAB — PROTIME-INR
INR: 1.03 (ref 0.00–1.49)
PROTHROMBIN TIME: 13.3 s (ref 11.6–15.2)

## 2013-07-24 LAB — TROPONIN I: TROPONIN I: 0.51 ng/mL — AB (ref <0.30)

## 2013-07-24 LAB — LIPID PANEL
CHOL/HDL RATIO: 9.4 ratio
Cholesterol: 273 mg/dL — ABNORMAL HIGH (ref 0–200)
HDL: 29 mg/dL — ABNORMAL LOW (ref >39)
LDL Cholesterol: UNDETERMINED mg/dL (ref 0–99)
Triglycerides: 966 mg/dL — ABNORMAL HIGH (ref <150)
VLDL: UNDETERMINED mg/dL (ref 0–40)

## 2013-07-24 LAB — MAGNESIUM: MAGNESIUM: 1.7 mg/dL (ref 1.5–2.5)

## 2013-07-24 LAB — POCT ACTIVATED CLOTTING TIME: ACTIVATED CLOTTING TIME: 359 s

## 2013-07-24 LAB — TSH: TSH: 1.675 u[IU]/mL (ref 0.350–4.500)

## 2013-07-24 LAB — PLATELET INHIBITION P2Y12

## 2013-07-24 SURGERY — PERCUTANEOUS CORONARY STENT INTERVENTION (PCI-S)
Anesthesia: LOCAL

## 2013-07-24 MED ORDER — HEPARIN (PORCINE) IN NACL 2-0.9 UNIT/ML-% IJ SOLN
INTRAMUSCULAR | Status: AC
  Filled 2013-07-24: qty 1500

## 2013-07-24 MED ORDER — OXYCODONE-ACETAMINOPHEN 5-325 MG PO TABS: 1.0000 | ORAL_TABLET | ORAL | Status: DC | PRN

## 2013-07-24 MED ORDER — SODIUM CHLORIDE 0.9 % IJ SOLN
3.0000 mL | Freq: Two times a day (BID) | INTRAMUSCULAR | Status: DC
  Administered 2013-07-24: 3 mL via INTRAVENOUS

## 2013-07-24 MED ORDER — ASPIRIN 81 MG PO CHEW
81.0000 mg | CHEWABLE_TABLET | ORAL | Status: AC
  Administered 2013-07-24: 81 mg via ORAL
  Filled 2013-07-24: qty 1

## 2013-07-24 MED ORDER — INSULIN GLARGINE 100 UNIT/ML ~~LOC~~ SOLN
22.5000 [IU] | Freq: Once | SUBCUTANEOUS | Status: AC
  Administered 2013-07-24: 23 [IU] via SUBCUTANEOUS
  Filled 2013-07-24: qty 0.23

## 2013-07-24 MED ORDER — VERAPAMIL HCL 2.5 MG/ML IV SOLN
INTRAVENOUS | Status: AC
  Filled 2013-07-24: qty 2

## 2013-07-24 MED ORDER — LIDOCAINE HCL (PF) 1 % IJ SOLN
INTRAMUSCULAR | Status: AC
  Filled 2013-07-24: qty 30

## 2013-07-24 MED ORDER — BIVALIRUDIN 250 MG IV SOLR
INTRAVENOUS | Status: AC
  Filled 2013-07-24: qty 250

## 2013-07-24 MED ORDER — ONDANSETRON HCL 4 MG/2ML IJ SOLN
4.0000 mg | Freq: Four times a day (QID) | INTRAMUSCULAR | Status: DC | PRN
  Administered 2013-07-24 (×2): 4 mg via INTRAVENOUS
  Filled 2013-07-24: qty 2

## 2013-07-24 MED ORDER — FENTANYL CITRATE 0.05 MG/ML IJ SOLN
INTRAMUSCULAR | Status: AC
  Filled 2013-07-24: qty 2

## 2013-07-24 MED ORDER — TICAGRELOR 90 MG PO TABS: 90.0000 mg | ORAL_TABLET | Freq: Two times a day (BID) | ORAL | Status: DC

## 2013-07-24 MED ORDER — MIDAZOLAM HCL 2 MG/2ML IJ SOLN
INTRAMUSCULAR | Status: AC
  Filled 2013-07-24: qty 2

## 2013-07-24 MED ORDER — NITROGLYCERIN 0.2 MG/ML ON CALL CATH LAB
INTRAVENOUS | Status: AC
  Filled 2013-07-24: qty 1

## 2013-07-24 MED ORDER — SODIUM CHLORIDE 0.9 % IV SOLN: INTRAVENOUS | Status: AC

## 2013-07-24 MED ORDER — SODIUM CHLORIDE 0.9 % IV SOLN
INTRAVENOUS | Status: DC
  Administered 2013-07-24: 15:00:00 via INTRAVENOUS

## 2013-07-24 MED ORDER — SODIUM CHLORIDE 0.9 % IJ SOLN: 3.0000 mL | INTRAMUSCULAR | Status: DC | PRN

## 2013-07-24 MED ORDER — SODIUM CHLORIDE 0.9 % IV SOLN: 250.0000 mL | INTRAVENOUS | Status: DC | PRN

## 2013-07-24 MED ORDER — ENOXAPARIN SODIUM 40 MG/0.4ML ~~LOC~~ SOLN
40.0000 mg | SUBCUTANEOUS | Status: DC
  Administered 2013-07-25: 40 mg via SUBCUTANEOUS
  Filled 2013-07-24 (×2): qty 0.4

## 2013-07-24 MED FILL — Sodium Chloride IV Soln 0.9%: INTRAVENOUS | Qty: 50 | Status: AC

## 2013-07-24 NOTE — Progress Notes (Signed)
Inpatient Diabetes Program Recommendations  AACE/ADA: New Consensus Statement on Inpatient Glycemic Control (2013)  Target Ranges:  Prepandial:   less than 140 mg/dL      Peak postprandial:   less than 180 mg/dL (1-2 hours)      Critically ill patients:  140 - 180 mg/dL   Results for Karen [name], Karen [name] (MRN [id number]) as of 07/24/2013 12:39  Ref. Range 07/23/2013 23:35  Glucose-Capillary Latest Range: 70-99 mg/dL 374 (H)   Diabetes history: DM2 Outpatient Diabetes medications: Lantus 60 units BID, Novolin R 20 units TID with meals, Metformin 1000 mg BID Current orders for Inpatient glycemic control: Lantus 45 units BID, Novolog 14 units TID with meals  Inpatient Diabetes Program Recommendations Insulin - Basal: Please consider increasing Lantus. As an outpatient, pt takes lantus 60 units BID. Correction (SSI): Please order Novolog moderate correction scale Q4H while NPO (change to ACHS when diet resumed). Novolog meal coverage: May want to consider discontinuing the Novolog meal coverage for now and use Novolog correction scale.  Once diet is resumed, if postprandial glucose consistently elevated then consider reordering Novolog meal coverage.  Note: Talked with the patient over the phone regarding home regimen for diabetes control.  Patient reports that she sees Dr. Leo Grosser for diabetes management and she takes Lantus 60 units BID, Metformin 1000 mg BID, and she is prescribed Novolin R 20 units TID but she does not usually have the Novolin because it costs $24.88 per vial at [facility] and she does not have the money to pay for it most of the time.  Inquired about insurance and she states that she has [employer] but she has not had the money to pay the premiums for the last 2 months and she has until beginning of April to pay the premiums or she will loose her insurance coverage.  She states that she gets her Lantus for free through [employer] patient assistance program and the Metformin is $4 at  [facility].  Encouraged patient to look online and see if she could apply for [employer] patient assistance program for the Novolin R.  Patient states she will ask the medical assistant at her PCP office to check into it for her.   Since patient does not regularly take the Novolin R, may want to discontinue the Novolog 14 units TID with meals and order Novolog correction scale. If post prandial glucose remains elevated once diet is resumed, then consider adding back Novolog meal coverage.  Patient was given Lantus 45 units last night at 23:36 and lab glucose at 3:24 am was 369 mg/dl.  Since patient takes Lantus 60 units BID as an outpatient she will likely need more basal insulin than currently ordered.  Therefore, please consider increasing Lantus.  Will continue to follow.  Thanks, Barnie Alderman, RN, MSN, CCRN Diabetes Coordinator Inpatient Diabetes Program [phone] (Team Pager) [phone] (AP office) [phone] [facility] office)

## 2013-07-24 NOTE — CV Procedure (Signed)
     PERCUTANEOUS CORONARY INTERVENTION   Karen [name] is a 57 y.o. female  INDICATION: Recurrent angina after PCI and clinical findings compatible with acute coronary syndrome. The patient presented with an acute inferior ST elevation MI less than 24 hours ago. Angioplasty of the right coronary was performed. 30 minutes after arriving in the CCU , she had recurrence of severe chest pain with anterior ST elevation. The discomfort was treated by adjusting IV nitroglycerin and there was resolution of anterior ST elevation and chest pain. The patient is brought back now to undergo relook at the right coronary and is widely patent we will proceed with PCI and stenting of a large ramus intermedius branch with greater than 95% obstruction that was not treated on presentation because it was not totally occluded and had TIMI grade 3 flow.Marland Kitchen   PROCEDURE: 1. Coronary arteriography; 2. Stent implantation ramus intermedius, DES   CONSENT: The risks, benefits, and details of the procedure were explained to the patient. Risks including death, MI, stroke, bleeding, limb ischemia, emergency CABG, renal failure and allergy were described and accepted by the patient.  Informed written consent was obtained prior to proceeding.  PROCEDURE TECHNIQUE:  After Xylocaine anesthesia a 5 French Slender sheath was placed in the right radial artery with a single anterior needle wall stick.   The native right coronary was visualized using a diagnostic 5 [place] JR 4. Coronary guiding shots were made of the ramus intermedius using a 6 French 3 cm EBU guide catheter. Antithrombotic therapy, bivalirudin bolus and infusion, was begun and determined to be therapeutic by ACT. Antiplatelet therapy,Brilinta, was loaded last evening.  The stenosis in the proximal ramus intermedius branch was dilated with a 2.5 x 15 mm long balloon. Balloon inflation reproduced the pain that the patient presented to the emergency room with as the  evening. We then positioned and deployed a 2.75 x 16 mm long Promus Premier drug-eluting stent to 11 atmospheres. We post dilated with a 2.75 x 12 mm Onycha Trek to 14 atmospheres. The final result was felt to be very acceptable with TIMI grade 3 flow and no significant residual stenosis  CONTRAST:  Total of 165 cc.  COMPLICATIONS:  None.    ANGIOGRAPHIC RESULTS:   Segmental ostial to proximal 95% ramus intermedius stenosis was reduced to 0% with TIMI grade 3 flow using a Promus Premier 16 mm long by 2.75 mm drug-eluting stent. Post dilatation with a 2.75 mm balloon to 14 atmospheres was performed.   IMPRESSIONS:  Successful drug-eluting stent implantation in the ostial proximal high-grade stenosis in the large ramus intermedius branch it is felt to be a culprit for the patient's acute coronary syndrome. Reduction in stenosis from greater than 95% to 0% with TIMI grade 3 flow was noted (final diameter 2.75 mm).   RECOMMENDATION:   Phase I cardiac rehabilitation Minimal myocardial damage has occurred. Beta blocker therapy, statin therapy, and dual antiplatelet therapy (at least one year) contemplate discharge on 1/44/81 if ambulates and has no recurrence of symptoms. Marland Kitchen    Sinclair Grooms, MD 07/24/2013 5:11 PM

## 2013-07-24 NOTE — Progress Notes (Addendum)
SUBJECTIVE:  No further chest pain  OBJECTIVE:   Vitals:   Filed Vitals:   07/24/13 0715 07/24/13 0730 07/24/13 0745 07/24/13 0800  BP: 107/61 112/54 147/70   Pulse: 66 68 74   Temp:    97.8 F (36.6 C)  TempSrc:    Oral  Resp: 14 13 12    Height:      Weight:      SpO2: 97% 96% 97%    I&O's:   Intake/Output Summary (Last 24 hours) at 07/24/13 0854 Last data filed at 07/24/13 0600  Gross per 24 hour  Intake 1257.55 ml  Output   2700 ml  Net -1442.45 ml   TELEMETRY: Reviewed telemetry pt in NSR     PHYSICAL EXAM General: Well developed, well nourished, in no acute distress Head: Eyes PERRLA, No xanthomas.   Normal cephalic and atramatic  Lungs:   Clear bilaterally to auscultation and percussion. Heart:   HRRR S1 S2 Pulses are 2+ & equal. Abdomen: Bowel sounds are positive, abdomen soft and non-tender without masses Extremities:   No clubbing, cyanosis or edema.  DP +1 Neuro: Alert and oriented X 3. Psych:  Good affect, responds appropriately   LABS: Basic Metabolic Panel:  Recent Labs  07/23/13 1954 07/23/13 2023 07/24/13 0324  NA 131* 135* 133*  K 4.9 4.9 4.6  CL 94* 101 96  CO2 21  --  21  GLUCOSE 429* 450* 369*  BUN 18 22 15   CREATININE 0.49* 0.60 0.49*  CALCIUM 9.7  --  9.3  MG  --   --  1.7   Liver Function Tests:  Recent Labs  07/23/13 1954  AST <5  ALT <5  ALKPHOS 101  BILITOT 0.3  PROT 7.4  ALBUMIN 3.6   No results found for this basename: LIPASE, AMYLASE,  in the last 72 hours CBC:  Recent Labs  07/23/13 1954 07/23/13 2023 07/24/13 0324  WBC 6.8  --  7.2  NEUTROABS  --   --  5.0  HGB 15.4* 16.3* 13.4  HCT 43.8 48.0* 38.0  MCV 82.6  --  82.1  PLT 202  --  179   Cardiac Enzymes:  Recent Labs  07/24/13 0020 07/24/13 0324  TROPONINI <0.30 0.51*   BNP: No components found with this basename: POCBNP,  D-Dimer: No results found for this basename: DDIMER,  in the last 72 hours Hemoglobin A1C: No results found for this  basename: HGBA1C,  in the last 72 hours Fasting Lipid Panel:  Recent Labs  07/24/13 0020  CHOL 273*  HDL 29*  LDLCALC UNABLE TO CALCULATE IF TRIGLYCERIDE OVER 400 mg/dL  TRIG 966*  CHOLHDL 9.4   Thyroid Function Tests: No results found for this basename: TSH, T4TOTAL, FREET3, T3FREE, THYROIDAB,  in the last 72 hours Anemia Panel: No results found for this basename: VITAMINB12, FOLATE, FERRITIN, TIBC, IRON, RETICCTPCT,  in the last 72 hours Coag Panel:   Lab Results  Component Value Date   INR 1.03 07/24/2013   INR 0.93 07/23/2013   INR 0.96 05/12/2009    RADIOLOGY: Dg Chest Port 1 View  07/23/2013   CLINICAL DATA Mid chest pain, hypertension, tachycardia, history diabetes, hypertension  EXAM PORTABLE CHEST - 1 VIEW  COMPARISON Portable exam 2020 hr compared to 08/28/2010  FINDINGS Normal heart size, mediastinal contours, and pulmonary vascularity.  Minimal linear subsegmental atelectasis at left base.  Lungs otherwise clear.  No pleural effusion or pneumothorax.  No acute osseous findings.  IMPRESSION Minimal subsegmental atelectasis  left base.  SIGNATURE  Electronically Signed   By: Mark  Boles M.D.   On: 07/23/2013 20:35      ASSESSMENT:  1.  S/p STEMI involving a nondominant RCA and high grade stenosis of ramus intermedius 2.  ASCAD s/p PTCA of nondominant RCA.  Plan for PCI of ramus on Friday 3.  IDDM with poorly controlled BS - she was on Metformin at home but now on hold due to contrast exposure.  Her BS was >400 on admission  4.  HTN - well controlled 5.  Dyslipidemia with severe hypertriglyceridemia  PLAN:   1.  DAPT with ASA/Ticagrelor 2.  Continue statin/beta blocker/ACE I 3.  Consider adding Lovaza at discharge due to marked elevation in triglycerides.  TAGs should decrease after getting BS more controlled 4.  Plan PCI of ramus intermedius later today per Dr. Smith 5.  She currently has no insurance.  She could not afford Lovaza in the past and did not tolerate  Lipitor due to nausea.  She had been on Simvastatin 40mg daily for over a year and not recommended to go to 80mg.  Ultimately Crestor would be the best option.  She also is currently on Ticagrelor and unsure she will be able to afford this.  I will get a Case Management consult for help with meds. 6.  Hospitalist consult for DM management 7.  Check 2D echo to assess LVF  TURNER,TRACI R, MD  07/24/2013  8:54 AM  

## 2013-07-24 NOTE — Progress Notes (Signed)
[id number] MI education began with pt. Gave MI booklet and reviewed restrictions. Pt is concerned about being out of work for any length of time as she is concerned about not having income coming in. Told pt she would need to discuss with cardiologist but she has to let heart heal. Reviewed NTG use and when to call MD or 911. Gave diabetic diet. Pt knows how to count carbs but has not been doing so. Will follow up with exercise ed tomorrow and ambulate if stable. Graylon Good RN BSN 07/24/2013 2:36 PM

## 2013-07-24 NOTE — H&P (View-Only) (Signed)
SUBJECTIVE:  No further chest pain  OBJECTIVE:   Vitals:   Filed Vitals:   07/24/13 0715 07/24/13 0730 07/24/13 0745 07/24/13 0800  BP: 107/61 112/54 147/70   Pulse: 66 68 74   Temp:    97.8 F (36.6 C)  TempSrc:    Oral  Resp: 14 13 12    Height:      Weight:      SpO2: 97% 96% 97%    I&O's:   Intake/Output Summary (Last 24 hours) at 07/24/13 0854 Last data filed at 07/24/13 0600  Gross per 24 hour  Intake 1257.55 ml  Output   2700 ml  Net -1442.45 ml   TELEMETRY: Reviewed telemetry pt in NSR     PHYSICAL EXAM General: Well developed, well nourished, in no acute distress Head: Eyes PERRLA, No xanthomas.   Normal cephalic and atramatic  Lungs:   Clear bilaterally to auscultation and percussion. Heart:   HRRR S1 S2 Pulses are 2+ & equal. Abdomen: Bowel sounds are positive, abdomen soft and non-tender without masses Extremities:   No clubbing, cyanosis or edema.  DP +1 Neuro: Alert and oriented X 3. Psych:  Good affect, responds appropriately   LABS: Basic Metabolic Panel:  Recent Labs  07/23/13 1954 07/23/13 2023 07/24/13 0324  NA 131* 135* 133*  K 4.9 4.9 4.6  CL 94* 101 96  CO2 21  --  21  GLUCOSE 429* 450* 369*  BUN 18 22 15   CREATININE 0.49* 0.60 0.49*  CALCIUM 9.7  --  9.3  MG  --   --  1.7   Liver Function Tests:  Recent Labs  07/23/13 1954  AST <5  ALT <5  ALKPHOS 101  BILITOT 0.3  PROT 7.4  ALBUMIN 3.6   No results found for this basename: LIPASE, AMYLASE,  in the last 72 hours CBC:  Recent Labs  07/23/13 1954 07/23/13 2023 07/24/13 0324  WBC 6.8  --  7.2  NEUTROABS  --   --  5.0  HGB 15.4* 16.3* 13.4  HCT 43.8 48.0* 38.0  MCV 82.6  --  82.1  PLT 202  --  179   Cardiac Enzymes:  Recent Labs  07/24/13 0020 07/24/13 0324  TROPONINI <0.30 0.51*   BNP: No components found with this basename: POCBNP,  D-Dimer: No results found for this basename: DDIMER,  in the last 72 hours Hemoglobin A1C: No results found for this  basename: HGBA1C,  in the last 72 hours Fasting Lipid Panel:  Recent Labs  07/24/13 0020  CHOL 273*  HDL 29*  LDLCALC UNABLE TO CALCULATE IF TRIGLYCERIDE OVER 400 mg/dL  TRIG 966*  CHOLHDL 9.4   Thyroid Function Tests: No results found for this basename: TSH, T4TOTAL, FREET3, T3FREE, THYROIDAB,  in the last 72 hours Anemia Panel: No results found for this basename: VITAMINB12, FOLATE, FERRITIN, TIBC, IRON, RETICCTPCT,  in the last 72 hours Coag Panel:   Lab Results  Component Value Date   INR 1.03 07/24/2013   INR 0.93 07/23/2013   INR 0.96 05/12/2009    RADIOLOGY: Dg Chest Port 1 View  07/23/2013   CLINICAL DATA Mid chest pain, hypertension, tachycardia, history diabetes, hypertension  EXAM PORTABLE CHEST - 1 VIEW  COMPARISON Portable exam 2020 hr compared to 08/28/2010  FINDINGS Normal heart size, mediastinal contours, and pulmonary vascularity.  Minimal linear subsegmental atelectasis at left base.  Lungs otherwise clear.  No pleural effusion or pneumothorax.  No acute osseous findings.  IMPRESSION Minimal subsegmental atelectasis  left base.  SIGNATURE  Electronically Signed   By: Lavonia Dana M.D.   On: 07/23/2013 20:35      ASSESSMENT:  1.  S/p STEMI involving a nondominant RCA and high grade stenosis of ramus intermedius 2.  ASCAD s/p PTCA of nondominant RCA.  Plan for PCI of ramus on Friday 3.  IDDM with poorly controlled BS - she was on Metformin at home but now on hold due to contrast exposure.  Her BS was >400 on admission  4.  HTN - well controlled 5.  Dyslipidemia with severe hypertriglyceridemia  PLAN:   1.  DAPT with ASA/Ticagrelor 2.  Continue statin/beta blocker/ACE I 3.  Consider adding Lovaza at discharge due to marked elevation in triglycerides.  TAGs should decrease after getting BS more controlled 4.  Plan PCI of ramus intermedius later today per Dr. Tamala Julian 5.  She currently has no insurance.  She could not afford Lovaza in the past and did not tolerate  Lipitor due to nausea.  She had been on Simvastatin 40mg  daily for over a year and not recommended to go to 80mg .  Ultimately Crestor would be the best option.  She also is currently on Ticagrelor and unsure she will be able to afford this.  I will get a Case Management consult for help with meds. 6.  Hospitalist consult for DM management 7.  Check 2D echo to assess LVF  Karen Margarita, MD  07/24/2013  8:54 AM

## 2013-07-24 NOTE — Interval H&P Note (Signed)
Cath Lab Visit (complete for each Cath Lab visit)  Clinical Evaluation Leading to the Procedure:   ACS: yes  Non-ACS:    Anginal Classification: CCS IV  Anti-ischemic medical therapy: Maximal Therapy (2 or more classes of medications)  Non-Invasive Test Results: No non-invasive testing performed  Prior CABG: No previous CABG      History and Physical Interval Note:  07/24/2013 3:31 PM The patient had an acute coronary presentation 18 hours ago. She had acute occlusion of the mid right coronary which turned out to be a nondominant vessel. Her presenting EKG demonstrated inferior ST elevation as well as anterior ST elevation V1 through the 4. At catheterization she was also found to have a high-grade stenosis of a ramus intermedius branch. Since this vessel had TIMI grade 3 flow it was not treated due to the acute presentation. Angioplasty was performed on the nondominant right coronary. TIMI grade 3 flow was reestablished. The patient was taken to her room in the coronary care unit and 30 minutes after arrival had a recurrence of severe chest pain with anterior ST elevation suggesting that the ramus intermedius branch was acutely occluded. This reversed itself with up titration of nitroglycerin. She is being brought back to the laboratory today to relook at the right coronary and to treat the ramus intermedius branch which has shown evidence of intermittent total obstruction based upon EKG findings. Episode of discomfort post procedure last evening lasted approximately 20 minutes. Karen STAUDER  has presented today for surgery, with the diagnosis of blockage  The various methods of treatment have been discussed with the patient and family. After consideration of risks, benefits and other options for treatment, the patient has consented to  Procedure(s): PERCUTANEOUS CORONARY STENT INTERVENTION (PCI-S) (N/A) as a surgical intervention .  The patient's history has been reviewed, patient  examined, no change in status, stable for surgery.  I have reviewed the patient's chart and labs.  Questions were answered to the patient's satisfaction.     Sinclair Grooms

## 2013-07-24 NOTE — ED Provider Notes (Signed)
CSN: [id number]     Arrival date & time 07/23/13  1943 History   First MD Initiated Contact with Patient 07/23/13 1958     Chief Complaint  Patient presents with  . Code STEMI     (Consider location/radiation/quality/duration/timing/severity/associated sxs/prior Treatment) The history is provided by the patient.  patient presents with chest pain for the last 2-3 days. Worse today. She states it is a dull pain in the middle of her chest. It is not worse with exertion. No nausea vomiting. No shortness of breath. She states she's had this fairly constantly for the last 2-3 days. She is diabetic but does not have cardiac history besides high blood pressure. She states family members have had early cardiac disease though. Initial EKG done in triage shows a STEMI. The pain does radiate to her back.  Past Medical History  Diagnosis Date  . History of chicken pox   . Depression   . Type 2 diabetes, uncontrolled, with neuropathy     dx 1993  . Chronic bronchitis   . Syncopal episodes   . GERD (gastroesophageal reflux disease)   . Seasonal allergies   . High blood pressure   . HLD (hyperlipidemia)   . Seizure 1977    off AEDs  . History of ovarian cyst   . Tubal pregnancy   . Tachyarrhythmia     ER visits with adenosine push to control   Past Surgical History  Procedure Laterality Date  . Tonsillectomy and adenoidectomy  1965  . Ovarian cyst surgery  1998  . Dilation and curettage of uterus  1984  . Ectopic pregnancy surgery Right 1984    tube removal 2/2 hemorrhage  . Rotator cuff repair  2006    Right   Family History  Problem Relation Age of Onset  . Diabetes Mother   . Hyperlipidemia Mother   . Hypertension Mother   . Stroke Mother   . Coronary artery disease Mother     CABG  . Heart disease Mother   . Diabetes Father   . Hypertension Father   . Hyperlipidemia Father   . Coronary artery disease Father     CABG  . Heart disease Father     CHF  . Pancreatic cancer  Father   . Heart disease Brother     arrhythmia  . Diabetes Maternal Grandmother   . Diabetes Maternal Grandfather   . Diabetes Paternal Grandmother   . Diabetes Paternal Grandfather   . Kidney disease Neg Hx    History  Substance Use Topics  . Smoking status: Never Smoker   . Smokeless tobacco: Never Used  . Alcohol Use: No   OB History   Grav Para Term Preterm Abortions TAB SAB Ect Mult Living                 Review of Systems  Constitutional: Negative for activity change and appetite change.  Eyes: Negative for pain.  Respiratory: Negative for chest tightness and shortness of breath.   Cardiovascular: Positive for chest pain. Negative for leg swelling.  Gastrointestinal: Negative for nausea, vomiting, abdominal pain, diarrhea and blood in stool.  Genitourinary: Negative for flank pain.  Musculoskeletal: Negative for back pain and neck stiffness.  Skin: Negative for rash.  Neurological: Negative for headaches.      Allergies  Erythromycin; Lipitor; and Penicillins  Home Medications  No current outpatient prescriptions on file. BP 138/64  Pulse 86  Temp(Src) 98 F (36.7 C) (Oral)  Resp 18  Ht 5\' 1"  (1.549 m)  Wt 181 lb 11.2 oz (82.419 kg)  BMI 34.35 kg/m2  SpO2 97% Physical Exam  Constitutional: She is oriented to person, place, and time. She appears well-developed and well-nourished.  HENT:  Head: Normocephalic.  Somewhat poor dentition  Eyes: Pupils are equal, round, and reactive to light.  Neck: Neck supple.  Cardiovascular: Normal rate and regular rhythm.   Pulmonary/Chest: Effort normal and breath sounds normal.  Abdominal: Soft.  Musculoskeletal: Normal range of motion.  Neurological: She is alert and oriented to person, place, and time.  Skin: Skin is warm.    ED Course  Procedures (including critical care time) Labs Review Labs Reviewed  CBC - Abnormal; Notable for the following:    RBC 5.30 (*)    Hemoglobin 15.4 (*)    All other  components within normal limits  BASIC METABOLIC PANEL - Abnormal; Notable for the following:    Sodium 131 (*)    Chloride 94 (*)    Glucose, Bld 429 (*)    Creatinine, Ser 0.49 (*)    All other components within normal limits  APTT - Abnormal; Notable for the following:    aPTT 50 (*)    All other components within normal limits  GLUCOSE, CAPILLARY - Abnormal; Notable for the following:    Glucose-Capillary 374 (*)    All other components within normal limits  I-STAT CHEM 8, ED - Abnormal; Notable for the following:    Sodium 135 (*)    Glucose, Bld 450 (*)    Hemoglobin 16.3 (*)    HCT 48.0 (*)    All other components within normal limits  MRSA PCR SCREENING  PROTIME-INR  HEPATIC FUNCTION PANEL  TROPONIN I  TROPONIN I  TROPONIN I  PROTIME-INR  CBC WITH DIFFERENTIAL  TSH  BASIC METABOLIC PANEL  MAGNESIUM  HEMOGLOBIN A1C  LIPID PANEL  I-STAT TROPOININ, ED   Imaging Review Dg Chest Port 1 View  07/23/2013   CLINICAL DATA Mid chest pain, hypertension, tachycardia, history diabetes, hypertension  EXAM PORTABLE CHEST - 1 VIEW  COMPARISON Portable exam 2020 hr compared to 08/28/2010  FINDINGS Normal heart size, mediastinal contours, and pulmonary vascularity.  Minimal linear subsegmental atelectasis at left base.  Lungs otherwise clear.  No pleural effusion or pneumothorax.  No acute osseous findings.  IMPRESSION Minimal subsegmental atelectasis left base.  SIGNATURE  Electronically Signed   By: Lavonia Dana M.D.   On: 07/23/2013 20:35     EKG Interpretation   Date/Time:  Tuesday July 23 2013 19:48:07 EDT Ventricular Rate:  88 PR Interval:  134 QRS Duration: 72 QT Interval:  332 QTC Calculation: 401 R Axis:   67 Text Interpretation:  Normal sinus rhythm ST elevation consider inferior  injury or acute infarct * ** ** ** * ACUTE MI  ** ** ** ** ST elevation  new since previous Confirmed by Kipper Buch  MD, Ovid Curd [phone]) on 07/23/2013  7:54:00 PM      MDM   Final  diagnoses:  STEMI (ST elevation myocardial infarction)    Patient had initial EKG that showed ST elevation inferiorly with some anterior elevation also. Pain has been for 2-3 days. His been worse today. Code STEMI was called. Patient was given aspirin and heparin. I discussed with Dr. Tamala Julian, who will take the patient emergently to the Cath Lab.  CRITICAL CARE Performed by: Mackie Pai  Total critical care time: 30 Critical care time was exclusive of separately billable procedures and treating  other patients. Critical care was necessary to treat or prevent imminent or life-threatening deterioration. Critical care was time spent personally by me on the following activities: development of treatment plan with patient and/or surrogate as well as nursing, discussions with consultants, evaluation of patient's response to treatment, examination of patient, obtaining history from patient or surrogate, ordering and performing treatments and interventions, ordering and review of laboratory studies, ordering and review of radiographic studies, pulse oximetry and re-evaluation of patient's condition.     Jasper Riling. Alvino Chapel, MD 07/24/13 [phone]

## 2013-07-24 NOTE — Progress Notes (Signed)
  Echocardiogram 2D Echocardiogram has been performed.  Karen, [name] 07/24/2013, 12:09 PM

## 2013-07-24 NOTE — Care Management Note (Addendum)
    Page 1 of 1   07/25/2013     1:37:21 PM   CARE MANAGEMENT NOTE 07/25/2013  Patient:  Karen [name], Karen [name]   Account Number:  [id number]  Date Initiated:  07/24/2013  Documentation initiated by:  Elissa Hefty  Subjective/Objective Assessment:   adm w mi     Action/Plan:   lives alone, pcp dr Caffie Pinto   Anticipated DC Date:  07/25/2013   Anticipated DC Plan:  HOME/SELF CARE  In-house referral  [place]  CM consult  Medication Assistance      Choice offered to / List presented to:             Status of service:  Completed, signed off Medicare Important Message given?   (If response is "NO", the following Medicare IM given date fields will be blank) Date Medicare IM given:   Date Additional Medicare IM given:    Discharge Disposition:  HOME/SELF CARE  Per UR Regulation:  Reviewed for med. necessity/level of care/duration of stay  If discussed at [place] of Stay Meetings, dates discussed:    Comments:  07-25-13 1:30pm Luz Lex, [place] [phone] Verified patient does have 30 day Brilinta card - states she needs to activate prior to use and will need insurance card.  Does not have her card with her.  Gave patient information about Crestor program including 30 free - again will need to call number to see if qualifies and activate. Patient states may be able to get insurance restarted in April.  3/11 1028a debbie dowell rn,bsn spoke w pt. she has not ins at present. her covenry ins is 2 mos late and they are not paying for meds. have asked [facility] to ck on her ins. gave pt 30day free brilinta card. have placed pt assist form on chart for md . pt states has pcp but left [facility] list. gave pt 2 prescription discount cards.

## 2013-07-25 ENCOUNTER — Other Ambulatory Visit: Payer: Self-pay | Admitting: Cardiology

## 2013-07-25 DIAGNOSIS — R079 Chest pain, unspecified: Secondary | ICD-10-CM

## 2013-07-25 DIAGNOSIS — I219 Acute myocardial infarction, unspecified: Secondary | ICD-10-CM

## 2013-07-25 LAB — CBC WITH DIFFERENTIAL/PLATELET
Basophils Absolute: 0 10*3/uL (ref 0.0–0.1)
Basophils Relative: 0 % (ref 0–1)
EOS PCT: 2 % (ref 0–5)
Eosinophils Absolute: 0.2 10*3/uL (ref 0.0–0.7)
HEMATOCRIT: 35.9 % — AB (ref 36.0–46.0)
Hemoglobin: 12.2 g/dL (ref 12.0–15.0)
LYMPHS ABS: 2.3 10*3/uL (ref 0.7–4.0)
LYMPHS PCT: 31 % (ref 12–46)
MCH: 28.4 pg (ref 26.0–34.0)
MCHC: 34 g/dL (ref 30.0–36.0)
MCV: 83.5 fL (ref 78.0–100.0)
MONO ABS: 0.5 10*3/uL (ref 0.1–1.0)
Monocytes Relative: 7 % (ref 3–12)
Neutro Abs: 4.5 10*3/uL (ref 1.7–7.7)
Neutrophils Relative %: 60 % (ref 43–77)
Platelets: 153 10*3/uL (ref 150–400)
RBC: 4.3 MIL/uL (ref 3.87–5.11)
RDW: 13.5 % (ref 11.5–15.5)
WBC: 7.5 10*3/uL (ref 4.0–10.5)

## 2013-07-25 LAB — MAGNESIUM: Magnesium: 1.6 mg/dL (ref 1.5–2.5)

## 2013-07-25 LAB — BASIC METABOLIC PANEL
BUN: 13 mg/dL (ref 6–23)
CO2: 20 mEq/L (ref 19–32)
Calcium: 8.4 mg/dL (ref 8.4–10.5)
Chloride: 99 mEq/L (ref 96–112)
Creatinine, Ser: 0.46 mg/dL — ABNORMAL LOW (ref 0.50–1.10)
Glucose, Bld: 240 mg/dL — ABNORMAL HIGH (ref 70–99)
POTASSIUM: 4 meq/L (ref 3.7–5.3)
SODIUM: 135 meq/L — AB (ref 137–147)

## 2013-07-25 LAB — GLUCOSE, CAPILLARY
Glucose-Capillary: 167 mg/dL — ABNORMAL HIGH (ref 70–99)
Glucose-Capillary: 205 mg/dL — ABNORMAL HIGH (ref 70–99)

## 2013-07-25 MED ORDER — NITROGLYCERIN 0.4 MG SL SUBL: 0.4000 mg | SUBLINGUAL_TABLET | SUBLINGUAL | Status: DC | PRN

## 2013-07-25 MED ORDER — TICAGRELOR 90 MG PO TABS: 90.0000 mg | ORAL_TABLET | Freq: Two times a day (BID) | ORAL | Status: DC

## 2013-07-25 MED ORDER — METFORMIN HCL 1000 MG PO TABS: 1000.0000 mg | ORAL_TABLET | Freq: Two times a day (BID) | ORAL | Status: DC

## 2013-07-25 MED ORDER — METOPROLOL TARTRATE 25 MG PO TABS: 25.0000 mg | ORAL_TABLET | Freq: Two times a day (BID) | ORAL | Status: DC

## 2013-07-25 MED ORDER — ASPIRIN 81 MG PO TBEC: 81.0000 mg | DELAYED_RELEASE_TABLET | Freq: Every day | ORAL | Status: AC

## 2013-07-25 MED FILL — Sodium Chloride IV Soln 0.9%: INTRAVENOUS | Qty: 50 | Status: AC

## 2013-07-25 NOTE — Progress Notes (Signed)
CARDIAC REHAB PHASE I   PRE:  Rate/Rhythm: 90SR  BP:  Supine: 159/71  Sitting:   Standing:    SaO2:   MODE:  Ambulation: 350 ft   POST:  Rate/Rhythm: 88 SR  BP:  Supine: 135/56  Sitting:   Standing:    SaO2:  1045-1120 Pt walked 350 ft on RA with steady gait. Tolerated well. No CP or dizziness. Tired by end of walk. Education completed. Stent booklet given and discussed importance of brilinta. Pt declined CRP 2 due to work schedule. Discussed with pt that she has to get HGA1C down.   Graylon Good, RN BSN  07/25/2013 11:15 AM

## 2013-07-25 NOTE — Progress Notes (Signed)
Inpatient Diabetes Program Recommendations  AACE/ADA: New Consensus Statement on Inpatient Glycemic Control (2013)  Target Ranges:  Prepandial:   less than 140 mg/dL      Peak postprandial:   less than 180 mg/dL (1-2 hours)      Critically ill patients:  140 - 180 mg/dL  Results for [name], [name] (MRN [id number]) as of 07/25/2013 08:44  Ref. Range 07/24/2013 13:32 07/24/2013 14:45 07/24/2013 17:06 07/24/2013 21:55 07/25/2013 07:51  Glucose-Capillary Latest Range: 70-99 mg/dL 251 (H) 233 (H) 216 (H) 217 (H) 167 (H)   Recommend adding Novolog moderate correction scale TID + HS.  Thank you  Raoul Pitch BSN, RN,CDE Inpatient Diabetes Coordinator [phone] (team pager)

## 2013-07-25 NOTE — Progress Notes (Signed)
SUBJECTIVE:  No complaints  OBJECTIVE:   Vitals:   Filed Vitals:   07/25/13 0530 07/25/13 0600 07/25/13 0705 07/25/13 0800  BP: 116/58 120/64 106/53   Pulse: 70 74  68  Temp:   98.5 F (36.9 C)   TempSrc:   Oral   Resp: 15 13 16 16   Height:      Weight:      SpO2: 97% 97% 95%    I&O's:   Intake/Output Summary (Last 24 hours) at 07/25/13 0843 Last data filed at 07/25/13 0600  Gross per 24 hour  Intake 1745.32 ml  Output   1450 ml  Net 295.32 ml   TELEMETRY: Reviewed telemetry pt in NSR:     PHYSICAL EXAM General: Well developed, well nourished, in no acute distress Head: Eyes PERRLA, No xanthomas.   Normal cephalic and atramatic  Lungs:   Clear bilaterally to auscultation and percussion. Heart:   HRRR S1 S2 Pulses are 2+ & equal. Abdomen: Bowel sounds are positive, abdomen soft and non-tender without masses  Extremities:   No clubbing, cyanosis or edema.  DP +1 Neuro: Alert and oriented X 3. Psych:  Good affect, responds appropriately   LABS: Basic Metabolic Panel:  Recent Labs  07/24/13 0324 07/25/13 0245  NA 133* 135*  K 4.6 4.0  CL 96 99  CO2 21 20  GLUCOSE 369* 240*  BUN 15 13  CREATININE 0.49* 0.46*  CALCIUM 9.3 8.4  MG 1.7 1.6   Liver Function Tests:  Recent Labs  07/23/13 1954  AST <5  ALT <5  ALKPHOS 101  BILITOT 0.3  PROT 7.4  ALBUMIN 3.6   No results found for this basename: LIPASE, AMYLASE,  in the last 72 hours CBC:  Recent Labs  07/24/13 0324 07/25/13 0245  WBC 7.2 7.5  NEUTROABS 5.0 4.5  HGB 13.4 12.2  HCT 38.0 35.9*  MCV 82.1 83.5  PLT 179 153   Cardiac Enzymes:  Recent Labs  07/24/13 0020 07/24/13 0324 07/24/13 1025  TROPONINI <0.30 0.51* <0.30   BNP: No components found with this basename: POCBNP,  D-Dimer: No results found for this basename: DDIMER,  in the last 72 hours Hemoglobin A1C:  Recent Labs  07/24/13 0324  HGBA1C 10.9*   Fasting Lipid Panel:  Recent Labs  07/24/13 0020  CHOL 273*    HDL 29*  LDLCALC UNABLE TO CALCULATE IF TRIGLYCERIDE OVER 400 mg/dL  TRIG 966*  CHOLHDL 9.4   Thyroid Function Tests:  Recent Labs  07/24/13 0324  TSH 1.675   Anemia Panel: No results found for this basename: VITAMINB12, FOLATE, FERRITIN, TIBC, IRON, RETICCTPCT,  in the last 72 hours Coag Panel:   Lab Results  Component Value Date   INR 1.03 07/24/2013   INR 0.93 07/23/2013   INR 0.96 05/12/2009    RADIOLOGY: Dg Chest Port 1 View  07/23/2013   CLINICAL DATA Mid chest pain, hypertension, tachycardia, history diabetes, hypertension  EXAM PORTABLE CHEST - 1 VIEW  COMPARISON Portable exam 2020 hr compared to 08/28/2010  FINDINGS Normal heart size, mediastinal contours, and pulmonary vascularity.  Minimal linear subsegmental atelectasis at left base.  Lungs otherwise clear.  No pleural effusion or pneumothorax.  No acute osseous findings.  IMPRESSION Minimal subsegmental atelectasis left base.  SIGNATURE  Electronically Signed   By: Lavonia Dana M.D.   On: 07/23/2013 20:35   ASSESSMENT:  1. S/p STEMI involving a nondominant RCA and high grade stenosis of ramus intermedius.  2D echo with normal  LVF and LVH 2. ASCAD s/p PTCA of nondominant RCA and staged PCI of ramus intermedius 3. IDDM with poorly controlled BS - she was on Metformin at home but now on hold due to contrast exposure. Her BS was >400 on admission  4. HTN - well controlled  5. Dyslipidemia with severe hypertriglyceridemia   PLAN:  1. DAPT with ASA/Ticagrelor  2. Continue statin/beta blocker/ACE I  3. Followup in our lipid clinic by our pharmacist for aggressive treatment of her dyslipidemia- will see if Case Management can do anything to get her on Crestor.  She is already on Simvastatin 40mg  daily and lipids are very uncontrolled and she did not tolerate lipitor due to nausea and vomiting.  If Case management can get Crestor would start at 40mg  due to high risk CAD.   4.  No Metformin until Saturday 3/14 am 5.  D/C home  today 6.  Followup with Dr. Tamala Julian in 1 week 7.  Needs to see her PCP early next week for aggressive treatment of DM - her HbbA1C was 10.9!         Sueanne Margarita, MD  07/25/2013  8:43 AM

## 2013-07-25 NOTE — Discharge Summary (Signed)
Physician Discharge Summary  Patient ID: Karen [name] MRN: [id number] DOB/AGE: [birth year] 58 y.o.  Admit date: 07/23/2013 Discharge date: 07/25/2013  Primary Cardiologist: Dr. Tamala Julian   Admission Diagnoses: STEMI   Discharge Diagnoses:  Active Problems:   Type 2 diabetes, uncontrolled, with neuropathy   GERD (gastroesophageal reflux disease)   HTN (hypertension)   Dyslipidemia   STEMI (ST elevation myocardial infarction)   Unstable angina   Discharged Condition: stable  Hospital Course: The patient is a 58 y/o WF, with a PMHx of long-standing IDDM, HTN, HLD, depression, h/o seizures who follows routinely with [facility]. She denies any prior h/o CAD/MI/stents/CABG/arrhythmia. She presents to the [facility] ED on 07/23/13 with a complaint of severe chest pain. On presentation, she had a 12-lead ECG showing NSR with anterior STE in V1-V3 as well as STE in III, aVF. In the ED, she received Heparin 4,000 units + Aspirin 324 mg chewable + NTG-SL. She was taken emergently to the cardiac cath lab as a code STEMI. The procedure was performed by Dr. Tamala Julian via the right radial artery. Angiography demonstrated total occlusion of the mid right coronary. There was also noted to be a high-grade obstruction in a ramus intermedius branch in the proximal segment extending back to the ostium. Dr. Tamala Julian decided that the right coronary was the culprit lesion despite the fact that one of the EKGs demonstrated transient ST elevation with hyperacute T waves in V1 through V4. This was felt related to the ramus intermedius branch. She underwent PTCA of the RCA with reduction in 100% stenosis to less than 50% with TIMI grade 3 flow. She had normal overall LV function with an EF of 55% w/o significant regional wall motion abnormalities. She left the cath lab in stable condition. She was started on DAPT with ASA + Brilinta, a BB and statin therapy. TRH was consulted for help with DM management (Hgb A1c was 10.9).   On  post cath day 1, she developed severe recurrent chest pain with anterior ST elevations. She was taken back to the cath lab for a re-look cath. This was also performed by Dr. Tamala Julian. The RCA was widely patent. It was decided to proceed with PCI and stenting of the ramus intermedius branch, which had 95% stenosis. This was successfully treated with a DES. She tolerated the procedure well and left the cath lab in stable condition. She was continued on the same medical therapy. She had no further chest pain. She was ambulating w/o symptoms. HR, BP and renal function were all stable. Troponin normalized to < 0.30 (down from 0.51). Today, she was seen and examined by Dr. Radford Pax, who determined she was stable for discharge home. She will see Truitt Merle, NP, in clinic for post hospital f/u. She will need to resume regular cardiac care with Dr. Tamala Julian. She has been instructed to f/u with her PCP for better management of her DM.    Consults: None  Significant Diagnostic Studies:   LHC 07/23/13 HEMODYNAMICS: Aortic pressure 197/93 mmHg; LV pressure 196/3 mmHg; LVEDP 9 mmHg  ANGIOGRAPHIC DATA: The left main coronary artery is widely patent.  The left anterior descending artery is widely patent and transapical. The vessels.diffusely diseased in the apical segment.  The ramus intermedius contains an ostial to proximal 95% stenosis. The vessel is large in distribution and trifurcates distally.  The left circumflex artery is dominant. He gives origin to 4 obtuse marginal branches and the posterior descending coronary artery. No significant obstruction is noted. The first  marginal contains ostial 30-40% narrowing  The right coronary artery is totally occluded in the midsegment. The vessel is obviously a nondominant vessel given the size of the circumflex artery.Marland Kitchen  PCI RESULTS: totally occluded mid right coronary reduced to less than 40% after angioplasty. Restoration of TIMI grade 3 flow was noted  LEFT  VENTRICULOGRAM: Left ventricular angiogram was done in the 30 RAO projection and revealed 55% without significant regional wall motion abnormality.   Treatments: See Hospital Course  Discharge Exam: Blood pressure 164/65, pulse 70, temperature 98.2 F (36.8 C), temperature source Oral, resp. rate 15, height 5\' 1"  (1.549 m), weight 184 lb 11.9 oz (83.8 kg), SpO2 96.00%.   Disposition: 01-Home or Self Care      Discharge Orders   Future Appointments Provider Department Dept Phone   08/09/2013 8:30 AM Burtis Junes, NP [facility] [phone]   Future Orders Complete By Expires   Diet - low sodium heart healthy  As directed    Increase activity slowly  As directed        Medication List    STOP taking these medications       ibuprofen 200 MG tablet  Commonly known as:  ADVIL,MOTRIN      TAKE these medications       acetaminophen 500 MG tablet  Commonly known as:  TYLENOL  Take 1,000 mg by mouth every 6 (six) hours as needed.     aspirin 81 MG EC tablet  Take 1 tablet (81 mg total) by mouth daily.     citalopram 20 MG tablet  Commonly known as:  CELEXA  Take 1 tablet (20 mg total) by mouth daily.     diazepam 5 MG tablet  Commonly known as:  VALIUM  Take 1 tablet (5 mg total) by mouth every 8 (eight) hours as needed (dizziness).     insulin glargine 100 UNIT/ML injection  Commonly known as:  LANTUS  Inject 60 Units into the skin 2 (two) times daily.     insulin regular 100 units/mL injection  Commonly known as:  NOVOLIN R,HUMULIN R  Inject 20 Units into the skin 3 (three) times daily before meals.     lisinopril 2.5 MG tablet  Commonly known as:  ZESTRIL  Take 1 tablet (2.5 mg total) by mouth daily.     meclizine 25 MG tablet  Commonly known as:  ANTIVERT  Take 1 tablet (25 mg total) by mouth 3 (three) times daily as needed.     metFORMIN 1000 MG tablet  Commonly known as:  GLUCOPHAGE  Take 1 tablet (1,000 mg total) by mouth 2 (two)  times daily with a meal. Do not restart Metformin until Saturday am 3/14     metoprolol tartrate 25 MG tablet  Commonly known as:  LOPRESSOR  Take 1 tablet (25 mg total) by mouth 2 (two) times daily.     nitroGLYCERIN 0.4 MG SL tablet  Commonly known as:  NITROSTAT  Place 1 tablet (0.4 mg total) under the tongue every 5 (five) minutes as needed for chest pain.     omeprazole 20 MG capsule  Commonly known as:  PRILOSEC  Take 20 mg by mouth daily.     promethazine 25 MG tablet  Commonly known as:  PHENERGAN  Take 1 tablet (25 mg total) by mouth every 6 (six) hours as needed for nausea.     simvastatin 40 MG tablet  Commonly known as:  ZOCOR  Take 1 tablet (  40 mg total) by mouth daily.     Ticagrelor 90 MG Tabs tablet  Commonly known as:  BRILINTA  Take 1 tablet (90 mg total) by mouth 2 (two) times daily.     Ticagrelor 90 MG Tabs tablet  Commonly known as:  BRILINTA  Take 1 tablet (90 mg total) by mouth 2 (two) times daily.       Follow-up Information   Follow up with Truitt Merle, NP On 08/09/2013. (8:30 am )    Specialty:  Nurse Practitioner   Contact information:   [place] [place] [address] [phone]      TIME SPENT ON DISCHARGE, INCLUDING PHYSICIAN TIME; >30 MINUTES  Signed: Lyda Jester 07/25/2013, 12:38 PM

## 2013-07-27 ENCOUNTER — Encounter: Payer: Self-pay | Admitting: Family Medicine

## 2013-07-30 ENCOUNTER — Telehealth: Payer: Self-pay | Admitting: *Deleted

## 2013-07-30 NOTE — Telephone Encounter (Signed)
possible metformin - recommend slow restart of metformin at 500mg  twice daily for 4 days then increase to 1000mg  in am and 500mg  at night for 4 days then increase to 1000mg  bid.

## 2013-07-30 NOTE — Telephone Encounter (Signed)
Patient called and is having diarrhea. She was in the hospital with MI and had stents placed. She was off of her metformin in the hospital for 5 days and restarted it 2 days ago. Could this be the cause of her diarrhea or could it be the Brilinta or the increase in metoprolol? She is also taking brand name prilosec and wonders if this may be causing it. I advised that more than likely it was the metformin but would ask you to be sure. She has a follow up with you on 08/05/13.

## 2013-07-31 NOTE — Telephone Encounter (Signed)
Patient notified and verbalized understanding. 

## 2013-08-05 ENCOUNTER — Encounter: Payer: Self-pay | Admitting: Family Medicine

## 2013-08-05 ENCOUNTER — Ambulatory Visit (INDEPENDENT_AMBULATORY_CARE_PROVIDER_SITE_OTHER): Payer: No Typology Code available for payment source | Admitting: Family Medicine

## 2013-08-05 VITALS — BP 116/60 | HR 69 | Temp 98.1°F | Wt 181.8 lb

## 2013-08-05 DIAGNOSIS — Z9861 Coronary angioplasty status: Secondary | ICD-10-CM

## 2013-08-05 DIAGNOSIS — E1142 Type 2 diabetes mellitus with diabetic polyneuropathy: Secondary | ICD-10-CM

## 2013-08-05 DIAGNOSIS — IMO0002 Reserved for concepts with insufficient information to code with codable children: Secondary | ICD-10-CM

## 2013-08-05 DIAGNOSIS — I2510 Atherosclerotic heart disease of native coronary artery without angina pectoris: Secondary | ICD-10-CM

## 2013-08-05 DIAGNOSIS — E1165 Type 2 diabetes mellitus with hyperglycemia: Secondary | ICD-10-CM

## 2013-08-05 DIAGNOSIS — E1140 Type 2 diabetes mellitus with diabetic neuropathy, unspecified: Secondary | ICD-10-CM

## 2013-08-05 DIAGNOSIS — E1149 Type 2 diabetes mellitus with other diabetic neurological complication: Secondary | ICD-10-CM

## 2013-08-05 MED ORDER — GABAPENTIN 100 MG PO CAPS: 100.0000 mg | ORAL_CAPSULE | Freq: Every day | ORAL | Status: DC

## 2013-08-05 NOTE — Patient Instructions (Addendum)
We will check for samples of novolin. Bring 1 week log of sugars to next visit. Return in 1 month for diabetes follow up - with log. Schedule eye exam. Let's try gabapentin 100mg  at bedtime - with option to increase to 2 and 3 pills at night time. Continue other medicines as up to now.

## 2013-08-05 NOTE — Assessment & Plan Note (Signed)
Discussed importance of better glycemic control in setting of recent CAD with stent and angioplasty Continue simvastatin (intolerant of lipitor and cannot afford crestor), continue brilinta and asa, continue B blocker and low dose ACEI. Appreciate cards care.

## 2013-08-05 NOTE — Progress Notes (Signed)
Pre visit review using our clinic review tool, if applicable. No additional management support is needed unless otherwise documented below in the visit note. 

## 2013-08-05 NOTE — Assessment & Plan Note (Signed)
Chronically uncontrolled. Reviewed diabetic regimen with patient, I encouraged her to remain compliant with her regimen as well as to start checking sugars and keeping log of sugars and bring me log to review at next office visit in 1 month. Difficulty with affording meds, consider adding sulfonylurea but will need to ensure compliance with current regimen first - pt endorses this.

## 2013-08-05 NOTE — Progress Notes (Signed)
BP 116/60  Pulse 69  Temp(Src) 98.1 F (36.7 C) (Oral)  Wt 181 lb 12 oz (82.441 kg)  SpO2 96%   CC: hospital follow up MI  Subjective:    Patient ID: Karen [name], female    DOB: Feb 02, 1956, 58 y.o.   MRN: [id number]  HPI: Karen [name] is a 58 y.o. female presenting on 08/05/2013 for Hospitalization Follow-up   Karen [name] presents today after recent hospitalization at [facility] 3/10-04/2014 with unstable angina leading to acute STEMI with total occlusion of R mid RCA with high grade obstruction of ramus intermedius.  Treated with PTCA to RCA and started on aspirin/brillinta.  Next day had recurrent chest pain, returned to cath lab and ramus intermedius branch was then stented with DES.    Since she's been home, noticing some dyspnea on exertion and at rest.  Denies any more chest pain.  She has f/u appt scheduled with cardiology on 08/09/2013.  DM - regularly does not check sugars.  Will restart checking twice daily.  Has meter and strips.  Compliant with antihyperglycemic regimen which includes: metformin 1000mg  bid, lantus 60u bid, and novolog 20u with meals.  Denies low sugars or hypoglycemic symptoms.  + paresthesias in feet worsening recently.  Last diabetic eye exam: not done yet.  Pneumovax: 06/10/2013.   Lab Results  Component Value Date   HGBA1C 10.9* 07/24/2013     Admit date: 07/23/2013  Discharge date: 07/25/2013  Primary Cardiologist: Dr. Tamala Julian  Admission Diagnoses: STEMI  Discharge Diagnoses:  Active Problems:  Type 2 diabetes, uncontrolled, with neuropathy  GERD (gastroesophageal reflux disease)  HTN (hypertension)  Dyslipidemia  STEMI (ST elevation myocardial infarction)  Unstable angina  Discharged Condition: stable  Hospital Course: The patient is a 58 y/o WF, with a PMHx of long-standing IDDM, HTN, HLD, depression, h/o seizures who follows routinely with [facility]. She denies any prior h/o CAD/MI/stents/CABG/arrhythmia. She presents to the [facility] ED on 07/23/13  with a complaint of severe chest pain. On presentation, she had a 12-lead ECG showing NSR with anterior STE in V1-V3 as well as STE in III, aVF. In the ED, she received Heparin 4,000 units + Aspirin 324 mg chewable + NTG-SL. She was taken emergently to the cardiac cath lab as a code STEMI. The procedure was performed by Dr. Tamala Julian via the right radial artery. Angiography demonstrated total occlusion of the mid right coronary. There was also noted to be a high-grade obstruction in a ramus intermedius branch in the proximal segment extending back to the ostium. Dr. Tamala Julian decided that the right coronary was the culprit lesion despite the fact that one of the EKGs demonstrated transient ST elevation with hyperacute T waves in V1 through V4. This was felt related to the ramus intermedius branch. She underwent PTCA of the RCA with reduction in 100% stenosis to less than 50% with TIMI grade 3 flow. She had normal overall LV function with an EF of 55% w/o significant regional wall motion abnormalities. She left the cath lab in stable condition. She was started on DAPT with ASA + Brilinta, a BB and statin therapy. TRH was consulted for help with DM management (Hgb A1c was 10.9).  On post cath day 1, she developed severe recurrent chest pain with anterior ST elevations. She was taken back to the cath lab for a re-look cath. This was also performed by Dr. Tamala Julian. The RCA was widely patent. It was decided to proceed with PCI and stenting of the ramus intermedius branch,  which had 95% stenosis. This was successfully treated with a DES. She tolerated the procedure well and left the cath lab in stable condition. She was continued on the same medical therapy. She had no further chest pain. She was ambulating w/o symptoms. HR, BP and renal function were all stable. Troponin normalized to < 0.30 (down from 0.51). Today, she was seen and examined by Dr. Radford Pax, who determined she was stable for discharge home. She will see Truitt Merle, NP, in clinic for post hospital f/u. She will need to resume regular cardiac care with Dr. Tamala Julian. She has been instructed to f/u with her PCP for better management of her DM.  Consults: None  Significant Diagnostic Studies:  LHC 07/23/13 ANGIOGRAPHIC DATA: The left main coronary artery is widely patent.  The left anterior descending artery is widely patent and transapical. The vessels.diffusely diseased in the apical segment.  The ramus intermedius contains an ostial to proximal 95% stenosis. The vessel is large in distribution and trifurcates distally.  The left circumflex artery is dominant. He gives origin to 4 obtuse marginal branches and the posterior descending coronary artery. No significant obstruction is noted. The first marginal contains ostial 30-40% narrowing  The right coronary artery is totally occluded in the midsegment. The vessel is obviously a nondominant vessel given the size of the circumflex artery.Marland Kitchen  PCI RESULTS: totally occluded mid right coronary reduced to less than 40% after angioplasty. Restoration of TIMI grade 3 flow was noted  LEFT VENTRICULOGRAM: Left ventricular angiogram was done in the 30 RAO projection and revealed 55% without significant regional wall motion abnormality.   Relevant past medical, surgical, family and social history reviewed and updated as indicated.  Allergies and medications reviewed and updated. Current Outpatient Prescriptions on File Prior to Visit  Medication Sig  . acetaminophen (TYLENOL) 500 MG tablet Take 1,000 mg by mouth every 6 (six) hours as needed.  Marland Kitchen aspirin EC 81 MG EC tablet Take 1 tablet (81 mg total) by mouth daily.  . citalopram (CELEXA) 20 MG tablet Take 1 tablet (20 mg total) by mouth daily.  . diazepam (VALIUM) 5 MG tablet Take 1 tablet (5 mg total) by mouth every 8 (eight) hours as needed (dizziness).  . insulin glargine (LANTUS) 100 UNIT/ML injection Inject 60 Units into the skin 2 (two) times daily.   .  insulin regular (NOVOLIN R,HUMULIN R) 100 units/mL injection Inject 20 Units into the skin 3 (three) times daily before meals.  Marland Kitchen lisinopril (ZESTRIL) 2.5 MG tablet Take 1 tablet (2.5 mg total) by mouth daily.  . meclizine (ANTIVERT) 25 MG tablet Take 1 tablet (25 mg total) by mouth 3 (three) times daily as needed.  . metFORMIN (GLUCOPHAGE) 1000 MG tablet Take 1 tablet (1,000 mg total) by mouth 2 (two) times daily with a meal. Do not restart Metformin until Saturday am 3/14  . metoprolol tartrate (LOPRESSOR) 25 MG tablet Take 1 tablet (25 mg total) by mouth 2 (two) times daily.  . nitroGLYCERIN (NITROSTAT) 0.4 MG SL tablet Place 1 tablet (0.4 mg total) under the tongue every 5 (five) minutes as needed for chest pain.  Marland Kitchen omeprazole (PRILOSEC) 20 MG capsule Take 20 mg by mouth daily.  . promethazine (PHENERGAN) 25 MG tablet Take 1 tablet (25 mg total) by mouth every 6 (six) hours as needed for nausea.  . simvastatin (ZOCOR) 40 MG tablet Take 1 tablet (40 mg total) by mouth daily.  . Ticagrelor (BRILINTA) 90 MG TABS tablet Take 1 tablet (  90 mg total) by mouth 2 (two) times daily.   No current facility-administered medications on file prior to visit.    Review of Systems Per HPI unless specifically indicated above    Objective:    BP 116/60  Pulse 69  Temp(Src) 98.1 F (36.7 C) (Oral)  Wt 181 lb 12 oz (82.441 kg)  SpO2 96%  Physical Exam  Nursing note and vitals reviewed. Constitutional: She appears well-developed and well-nourished. No distress.  HENT:  Mouth/Throat: Oropharynx is clear and moist. No oropharyngeal exudate.  Cardiovascular: Normal rate, regular rhythm, normal heart sounds and intact distal pulses.   No murmur heard. Pulmonary/Chest: Effort normal and breath sounds normal. No respiratory distress. She has no wheezes. She has no rales.  Musculoskeletal: She exhibits no edema.  Skin: Skin is warm and dry. No rash noted.  Psychiatric: She has a normal mood and affect.        Assessment & Plan:   Problem List Items Addressed This Visit   CAD S/P percutaneous coronary angioplasty - Primary     Discussed importance of better glycemic control in setting of recent CAD with stent and angioplasty Continue simvastatin (intolerant of lipitor and cannot afford crestor), continue brilinta and asa, continue B blocker and low dose ACEI. Appreciate cards care.    Type 2 diabetes, uncontrolled, with neuropathy     Chronically uncontrolled. Reviewed diabetic regimen with patient, I encouraged her to remain compliant with her regimen as well as to start checking sugars and keeping log of sugars and bring me log to review at next office visit in 1 month. Difficulty with affording meds, consider adding sulfonylurea but will need to ensure compliance with current regimen first - pt endorses this.        Follow up plan: Return in about 1 month (around 09/05/2013), or as needed, for follow up diabetes.

## 2013-08-09 ENCOUNTER — Encounter: Payer: Self-pay | Admitting: *Deleted

## 2013-08-09 ENCOUNTER — Ambulatory Visit (INDEPENDENT_AMBULATORY_CARE_PROVIDER_SITE_OTHER): Payer: No Typology Code available for payment source | Admitting: Nurse Practitioner

## 2013-08-09 ENCOUNTER — Encounter: Payer: Self-pay | Admitting: Nurse Practitioner

## 2013-08-09 VITALS — BP 120/64 | HR 59 | Ht 61.0 in | Wt 180.8 lb

## 2013-08-09 DIAGNOSIS — IMO0002 Reserved for concepts with insufficient information to code with codable children: Secondary | ICD-10-CM

## 2013-08-09 DIAGNOSIS — R0600 Dyspnea, unspecified: Secondary | ICD-10-CM

## 2013-08-09 DIAGNOSIS — E1165 Type 2 diabetes mellitus with hyperglycemia: Secondary | ICD-10-CM

## 2013-08-09 DIAGNOSIS — I213 ST elevation (STEMI) myocardial infarction of unspecified site: Secondary | ICD-10-CM

## 2013-08-09 DIAGNOSIS — R0989 Other specified symptoms and signs involving the circulatory and respiratory systems: Secondary | ICD-10-CM

## 2013-08-09 DIAGNOSIS — IMO0001 Reserved for inherently not codable concepts without codable children: Secondary | ICD-10-CM

## 2013-08-09 DIAGNOSIS — R0609 Other forms of dyspnea: Secondary | ICD-10-CM

## 2013-08-09 DIAGNOSIS — I219 Acute myocardial infarction, unspecified: Secondary | ICD-10-CM

## 2013-08-09 LAB — BASIC METABOLIC PANEL
BUN: 17 mg/dL (ref 6–23)
CO2: 26 mEq/L (ref 19–32)
Calcium: 9.8 mg/dL (ref 8.4–10.5)
Chloride: 100 mEq/L (ref 96–112)
Creatinine, Ser: 0.6 mg/dL (ref 0.4–1.2)
GFR: 103.91 mL/min (ref >60.00)
Glucose, Bld: 273 mg/dL — ABNORMAL HIGH (ref 70–99)
Potassium: 4.9 mEq/L (ref 3.5–5.1)
Sodium: 133 mEq/L — ABNORMAL LOW (ref 135–145)

## 2013-08-09 LAB — CBC
HCT: 43.1 % (ref 36.0–46.0)
Hemoglobin: 14.4 g/dL (ref 12.0–15.0)
MCHC: 33.3 g/dL (ref 30.0–36.0)
MCV: 85 fl (ref 78.0–100.0)
Platelets: 200 10*3/uL (ref 150.0–400.0)
RBC: 5.07 Mil/uL (ref 3.87–5.11)
RDW: 14.1 % (ref 11.5–14.6)
WBC: 6.6 10*3/uL (ref 4.5–10.5)

## 2013-08-09 MED ORDER — CLOPIDOGREL BISULFATE 75 MG PO TABS
75.0000 mg | ORAL_TABLET | Freq: Every day | ORAL | Status: DC
Start: 2013-08-09 — End: 2014-10-29

## 2013-08-09 MED ORDER — FAMOTIDINE 20 MG PO TABS: 20.0000 mg | ORAL_TABLET | Freq: Two times a day (BID) | ORAL | Status: DC

## 2013-08-09 NOTE — Patient Instructions (Addendum)
We will check labs today  I will send a note to cardiac rehab if you would like to attend  Stay on your current medicines but we are stopping the Brilinta and putting you on Plavix 75 mg a day - this is at the drug store if you chose to do this option.  If you switch over to Plavix - you have to stop your Omeprazole due to a drug interraction with Plavix - you may use Pepcid 20 mg a day in the place of the Omeprazole - this is at the drug store as well  You may return to work after [employer] - April 6th - 1/2 days for 2 weeks and then fulltime  See Dr. Tamala Julian in 6 weeks with fasting labs.  Call the [place] office at [phone] if you have any questions, problems or concerns.

## 2013-08-09 NOTE — Progress Notes (Signed)
Karen [name] Date of Birth: January 06, 1956 Medical Record [id number]  History of Present Illness: Karen [name] is seen back today for a post hospital visit. Seen for Dr. Tamala Julian. She has long standing IDDm, HTN, HLD, depression, seizures but no past history of CAD/Mi/stent/CABG/arrhythmia.  Most recently admitted earlier this month with STEMI. Emergent cath with PTCA of the RCA, residual disease in the intermediate, normal LV function. Noted to have A1C over 10. She developed recurrent chest pain shortly after going to the CCU with ST elevation -  was recathed - had PCI and stenting of the ramus intermediate with DES. The RCA was patent. Discharged on DAPT for minimum of one year but probably indefinite.   Comes back today. Here alone. She has had no more chest pain. She is short of breath. Says it started shorted after her PCI's. Has to stop but able to walk about 15 minutes BID. No swelling. Tolerating her medicines but will not be able to afford long term Brilinta. BP ok. Never smoked. Says she is "working" on her sugars but not able to tell me what her glucoses are.   Current Outpatient Prescriptions  Medication Sig Dispense Refill  . acetaminophen (TYLENOL) 500 MG tablet Take 1,000 mg by mouth every 6 (six) hours as needed.      Marland Kitchen aspirin EC 81 MG EC tablet Take 1 tablet (81 mg total) by mouth daily.      . citalopram (CELEXA) 20 MG tablet Take 1 tablet (20 mg total) by mouth daily.  30 tablet  3  . diazepam (VALIUM) 5 MG tablet Take 1 tablet (5 mg total) by mouth every 8 (eight) hours as needed (dizziness).  15 tablet  0  . gabapentin (NEURONTIN) 100 MG capsule Take 1 capsule (100 mg total) by mouth at bedtime.  90 capsule  3  . insulin glargine (LANTUS) 100 UNIT/ML injection Inject 60 Units into the skin 2 (two) times daily.       . insulin regular (NOVOLIN R,HUMULIN R) 100 units/mL injection Inject 20 Units into the skin 3 (three) times daily before meals.      Marland Kitchen lisinopril (ZESTRIL) 2.5 MG  tablet Take 1 tablet (2.5 mg total) by mouth daily.  30 tablet  11  . meclizine (ANTIVERT) 25 MG tablet Take 1 tablet (25 mg total) by mouth 3 (three) times daily as needed.  30 tablet  0  . metFORMIN (GLUCOPHAGE) 1000 MG tablet Take 1 tablet (1,000 mg total) by mouth 2 (two) times daily with a meal. Do not restart Metformin until Saturday am 3/14  60 tablet  5  . metoprolol tartrate (LOPRESSOR) 25 MG tablet Take 1 tablet (25 mg total) by mouth 2 (two) times daily.  60 tablet  5  . nitroGLYCERIN (NITROSTAT) 0.4 MG SL tablet Place 1 tablet (0.4 mg total) under the tongue every 5 (five) minutes as needed for chest pain.  25 tablet  2  . omeprazole (PRILOSEC) 20 MG capsule Take 20 mg by mouth daily.      . promethazine (PHENERGAN) 25 MG tablet Take 1 tablet (25 mg total) by mouth every 6 (six) hours as needed for nausea.  12 tablet  0  . simvastatin (ZOCOR) 40 MG tablet Take 1 tablet (40 mg total) by mouth daily.  30 tablet  11  . Ticagrelor (BRILINTA) 90 MG TABS tablet Take 1 tablet (90 mg total) by mouth 2 (two) times daily.  60 tablet  0   No current facility-administered  medications for this visit.    Allergies  Allergen Reactions  . Erythromycin Other (See Comments)    Stomach cramps  . Lipitor [Atorvastatin] Nausea Only  . Penicillins Rash    Past Medical History  Diagnosis Date  . History of chicken pox   . Depression   . Type 2 diabetes, uncontrolled, with neuropathy     dx 1993  . Chronic bronchitis   . Syncopal episodes   . GERD (gastroesophageal reflux disease)   . Seasonal allergies   . High blood pressure   . HLD (hyperlipidemia)   . Seizure 1977    off AEDs  . History of ovarian cyst   . Tubal pregnancy   . Tachyarrhythmia     ER visits with adenosine push to control  . CAD (coronary artery disease)     STEMI 07/2013    Past Surgical History  Procedure Laterality Date  . Tonsillectomy and adenoidectomy  1965  . Ovarian cyst surgery  1998  . Dilation and  curettage of uterus  1984  . Ectopic pregnancy surgery Right 1984    tube removal 2/2 hemorrhage  . Rotator cuff repair  2006    Right  . Cardiac catheterization  07/2013    angioplasty to RCA and DES to ramus intermedius    History  Smoking status  . Never Smoker   Smokeless tobacco  . Never Used    History  Alcohol Use No    Family History  Problem Relation Age of Onset  . Diabetes Mother   . Hyperlipidemia Mother   . Hypertension Mother   . Stroke Mother   . Coronary artery disease Mother     CABG  . Heart disease Mother   . Diabetes Father   . Hypertension Father   . Hyperlipidemia Father   . Coronary artery disease Father     CABG  . Heart disease Father     CHF  . Pancreatic cancer Father   . Heart disease Brother     arrhythmia  . Diabetes Maternal Grandmother   . Diabetes Maternal Grandfather   . Diabetes Paternal Grandmother   . Diabetes Paternal Grandfather   . Kidney disease Neg Hx     Review of Systems: The review of systems is per the HPI.  All other systems were reviewed and are negative.  Physical Exam: BP 120/64  Pulse 59  Ht 5\' 1"  (1.549 m)  Wt 180 lb 12.8 oz (82.01 kg)  BMI 34.18 kg/m2  SpO2 98% Patient is alert and in no acute distress. Skin is warm and dry. Color is normal.  HEENT is unremarkable but missing many teeth. Multiple skin tags on her face.  Normocephalic/atraumatic. PERRL. Sclera are nonicteric. Neck is supple. No masses. No JVD. Lungs are clear. Cardiac exam shows a regular rate and rhythm. Abdomen is soft. Extremities are without edema. Gait and ROM are intact. No gross neurologic deficits noted.  LABORATORY DATA: PENDING  Lab Results  Component Value Date   WBC 7.5 07/25/2013   HGB 12.2 07/25/2013   HCT 35.9* 07/25/2013   PLT 153 07/25/2013   GLUCOSE 240* 07/25/2013   CHOL 273* 07/24/2013   TRIG 966* 07/24/2013   HDL 29* 07/24/2013   LDLDIRECT 108.0 05/24/2013   LDLCALC UNABLE TO CALCULATE IF TRIGLYCERIDE OVER 400 mg/dL  07/24/2013   ALT <5 07/23/2013   AST <5 07/23/2013   NA 135* 07/25/2013   K 4.0 07/25/2013   CL 99 07/25/2013   CREATININE  0.46* 07/25/2013   BUN 13 07/25/2013   CO2 20 07/25/2013   TSH 1.675 07/24/2013   INR 1.03 07/24/2013   HGBA1C 10.9* 07/24/2013   MICROALBUR 2.6* 05/24/2013   ANGIOGRAPHIC DATA: The left main coronary artery is widely patent.  The left anterior descending artery is widely patent and transapical. The vessels.diffusely diseased in the apical segment.  The ramus intermedius contains an ostial to proximal 95% stenosis. The vessel is large in distribution and trifurcates distally.  The left circumflex artery is dominant. He gives origin to 4 obtuse marginal branches and the posterior descending coronary artery. No significant obstruction is noted. The first marginal contains ostial 30-40% narrowing  The right coronary artery is totally occluded in the midsegment. The vessel is obviously a nondominant vessel given the size of the circumflex artery.Marland Kitchen  PCI RESULTS: totally occluded mid right coronary reduced to less than 40% after angioplasty. Restoration of TIMI grade 3 flow was noted  LEFT VENTRICULOGRAM: Left ventricular angiogram was done in the 30 RAO projection and revealed 55% without significant regional wall motion abnormality.  IMPRESSIONS: 1. Acute inferior ST elevation myocardial infarction involving a nondominant right coronary artery.  2. Successful PTCA of the right coronary with reduction in 100% stenosis to less than 50% with TIMI grade 3 flow.  3. High-grade obstruction a large ramus intermedius branch ostial to proximal segment. The patient's initial EKG demonstrated mid anterior wall ST elevation that was likely related to transient occlusion of the ramus intermedius branch.  4. Widely patent LAD and circumflex.  5. Overall normal LV function  RECOMMENDATION:  Ramus intermedius PCI on Friday a.m.  Aggressive medical therapy risk factors(beta blocker, statin therapy,  diabetes control, aspirin, and Brilinta)  Phase I cardiac rehabilitation  Transfer to telemetry in a.m and then precipitated discharge on 09/26/13, AM.   ANGIOGRAPHIC RESULTS: Segmental ostial to proximal 95% ramus intermedius stenosis was reduced to 0% with TIMI grade 3 flow using a Promus Premier 16 mm long by 2.75 mm drug-eluting stent. Post dilatation with a 2.75 mm balloon to 14 atmospheres was performed.  IMPRESSIONS: Successful drug-eluting stent implantation in the ostial proximal high-grade stenosis in the large ramus intermedius branch it is felt to be a culprit for the patient's acute coronary syndrome. Reduction in stenosis from greater than 95% to 0% with TIMI grade 3 flow was noted (final diameter 2.75 mm).  RECOMMENDATION:  Phase I cardiac rehabilitation  Minimal myocardial damage has occurred.  Beta blocker therapy, statin therapy, and dual antiplatelet therapy (at least one year) contemplate discharge on 5/64/33 if ambulates and has no recurrence of symptoms.  Marland Kitchen  Sinclair Grooms, MD  07/24/2013  5:11 PM   Echo Study Conclusions  Left ventricle: The cavity size was normal. Wall thickness was increased in a pattern of mild LVH. Systolic function was normal. The estimated ejection fraction was in the range of 55% to 60%. Wall motion was normal; there were no regional wall motion abnormalities. Left ventricular diastolic function parameters were normal.    Assessment / Plan: 1. STEMI - of the inferior wall - treated with PTCA of the RCA followed by DES to the ramus intermediate - no further chest pain. Has normal EF. She is short of breath - on Brilinta. I feel this is the culprit. Will discuss with Dr. Tamala Julian - probably change to Plavix. Dr. Tamala Julian is in agreement with switching to Plavix - patient now tells me she has NO insurance - wants to keep taking the [place] (  which I suspect she will not be able to afford). If she choses to switch to Plavix she will have to stop the  Omeprazole and use Pepcid in its place. I have told her she may return to work April 6th - 1/2 days for 2 weeks and then back full time.   2. Uncontrolled IDDM - explained that good blood sugar control is key to long term prognosis.  3. HTN - BP is ok.  4. HLD - on statin  Worry about her long term compliance due to cost issues for her medicines. Recheck baseline labs today. See her back in a month.   Patient is agreeable to this plan and will call if any problems develop in the interim.   Burtis Junes, RN, [facility] [address] [place], [place]  [address] [phone]

## 2013-08-10 ENCOUNTER — Encounter: Payer: Self-pay | Admitting: Family Medicine

## 2013-08-12 ENCOUNTER — Telehealth: Payer: Self-pay

## 2013-08-12 NOTE — Telephone Encounter (Signed)
Pt left v/m; pt has 3 vials of lantus insulin left and request Lantus insulin ordered thru [employer] free program. Pt request cb.

## 2013-08-12 NOTE — Telephone Encounter (Signed)
Spoke to patient and was advised that Karen [name] has been getting this for her. Advised patient that I will see about getting this taken care of. Aniceto Boss can you help with this?

## 2013-08-13 ENCOUNTER — Telehealth: Payer: Self-pay

## 2013-08-13 NOTE — Telephone Encounter (Signed)
Form completed and faxed to [employer], form sent to front office to be scanned into chart.

## 2013-08-13 NOTE — Telephone Encounter (Signed)
Relevant patient education assigned to patient using Emmi. ° °

## 2013-08-29 ENCOUNTER — Other Ambulatory Visit: Payer: Self-pay | Admitting: *Deleted

## 2013-08-29 MED ORDER — INSULIN GLARGINE 100 UNIT/ML ~~LOC~~ SOLN: 60.0000 [IU] | Freq: Two times a day (BID) | SUBCUTANEOUS | Status: DC

## 2013-08-30 ENCOUNTER — Telehealth: Payer: Self-pay

## 2013-08-30 MED ORDER — INSULIN GLARGINE 100 UNIT/ML ~~LOC~~ SOLN: 60.0000 [IU] | Freq: Two times a day (BID) | SUBCUTANEOUS | Status: DC

## 2013-08-30 NOTE — Telephone Encounter (Addendum)
Pt said [facility] was too expensive to get lantus; pt has not heard from 07/2013 request for free program with [employer]; pt [facility] # [facility]. Advised pt taking 120 units daily with 1000 unit vial, I think 3 months would estimate at 12 vials. [facility] and call me back with # of [facility]ials [facility] will allow. Pt called back and said [facility] will allow 10 vials for 3 month period. Pt said I had to call [phone] to leave order for [facility].Marland Kitchenspoke with [place] and refills done. Pt notified done.

## 2013-09-03 MED ORDER — INSULIN DETEMIR 100 UNIT/ML ~~LOC~~ SOLN: SUBCUTANEOUS | Status: DC

## 2013-09-03 NOTE — Telephone Encounter (Signed)
Changed, sent. No change in sig.

## 2013-09-03 NOTE — Telephone Encounter (Signed)
I spoke with patient last week on 08/29/13 about her patient assistance through [facility]. I advised her that since she had insurance now, they would not offer patient assistance any longer. She verbalized understanding at that time and asked that I send her Lantus into [facility] which I did. She said she may have to change to [facility] if it was too expensive there and would call back when she established with [facility] if necessary. Message left notifying patient that med had been sent in and that it was Levemir instead of Lantus with the same instructions and reminded her that I spoke with her last week and advised her about the patient assistance program and she verbalized understanding at that time.

## 2013-09-03 NOTE — Telephone Encounter (Signed)
[place] tech [facility] left v/m; Lantus is not covered under pts ins plan and request substitute of Levemir vial.Please advise. Ref #[id number].

## 2013-09-08 ENCOUNTER — Encounter (HOSPITAL_COMMUNITY): Payer: Self-pay | Admitting: Emergency Medicine

## 2013-09-08 ENCOUNTER — Emergency Department (HOSPITAL_COMMUNITY)
Admission: EM | Admit: 2013-09-08 | Discharge: 2013-09-08 | Disposition: A | Discharge status: Home / Self Care | Payer: No Typology Code available for payment source | Attending: Emergency Medicine | Admitting: Emergency Medicine

## 2013-09-08 ENCOUNTER — Emergency Department (HOSPITAL_COMMUNITY): Payer: No Typology Code available for payment source

## 2013-09-08 DIAGNOSIS — Z7902 Long term (current) use of antithrombotics/antiplatelets: Secondary | ICD-10-CM | POA: Insufficient documentation

## 2013-09-08 DIAGNOSIS — Z79899 Other long term (current) drug therapy: Secondary | ICD-10-CM | POA: Insufficient documentation

## 2013-09-08 DIAGNOSIS — Z8742 Personal history of other diseases of the female genital tract: Secondary | ICD-10-CM | POA: Insufficient documentation

## 2013-09-08 DIAGNOSIS — Z794 Long term (current) use of insulin: Secondary | ICD-10-CM | POA: Insufficient documentation

## 2013-09-08 DIAGNOSIS — Z9889 Other specified postprocedural states: Secondary | ICD-10-CM | POA: Insufficient documentation

## 2013-09-08 DIAGNOSIS — J209 Acute bronchitis, unspecified: Secondary | ICD-10-CM | POA: Insufficient documentation

## 2013-09-08 DIAGNOSIS — I2510 Atherosclerotic heart disease of native coronary artery without angina pectoris: Secondary | ICD-10-CM | POA: Insufficient documentation

## 2013-09-08 DIAGNOSIS — G40909 Epilepsy, unspecified, not intractable, without status epilepticus: Secondary | ICD-10-CM | POA: Insufficient documentation

## 2013-09-08 DIAGNOSIS — E1142 Type 2 diabetes mellitus with diabetic polyneuropathy: Secondary | ICD-10-CM | POA: Insufficient documentation

## 2013-09-08 DIAGNOSIS — J40 Bronchitis, not specified as acute or chronic: Secondary | ICD-10-CM

## 2013-09-08 DIAGNOSIS — Z7982 Long term (current) use of aspirin: Secondary | ICD-10-CM | POA: Insufficient documentation

## 2013-09-08 DIAGNOSIS — K219 Gastro-esophageal reflux disease without esophagitis: Secondary | ICD-10-CM | POA: Insufficient documentation

## 2013-09-08 DIAGNOSIS — Z8619 Personal history of other infectious and parasitic diseases: Secondary | ICD-10-CM | POA: Insufficient documentation

## 2013-09-08 DIAGNOSIS — E1149 Type 2 diabetes mellitus with other diabetic neurological complication: Secondary | ICD-10-CM | POA: Insufficient documentation

## 2013-09-08 DIAGNOSIS — I252 Old myocardial infarction: Secondary | ICD-10-CM | POA: Insufficient documentation

## 2013-09-08 DIAGNOSIS — E785 Hyperlipidemia, unspecified: Secondary | ICD-10-CM | POA: Insufficient documentation

## 2013-09-08 DIAGNOSIS — Z880 Allergy status to penicillin: Secondary | ICD-10-CM | POA: Insufficient documentation

## 2013-09-08 DIAGNOSIS — F3289 Other specified depressive episodes: Secondary | ICD-10-CM | POA: Insufficient documentation

## 2013-09-08 DIAGNOSIS — F329 Major depressive disorder, single episode, unspecified: Secondary | ICD-10-CM | POA: Insufficient documentation

## 2013-09-08 LAB — COMPREHENSIVE METABOLIC PANEL
ALBUMIN: 3.5 g/dL (ref 3.5–5.2)
ALT: 25 U/L (ref 0–35)
AST: 22 U/L (ref 0–37)
Alkaline Phosphatase: 63 U/L (ref 39–117)
BUN: 14 mg/dL (ref 6–23)
CO2: 23 mEq/L (ref 19–32)
Calcium: 9.5 mg/dL (ref 8.4–10.5)
Chloride: 101 mEq/L (ref 96–112)
Creatinine, Ser: 0.5 mg/dL (ref 0.50–1.10)
GFR calc non Af Amer: >90 mL/min (ref >90)
Glucose, Bld: 228 mg/dL — ABNORMAL HIGH (ref 70–99)
POTASSIUM: 4.2 meq/L (ref 3.7–5.3)
SODIUM: 136 meq/L — AB (ref 137–147)
TOTAL PROTEIN: 6.8 g/dL (ref 6.0–8.3)
Total Bilirubin: 0.3 mg/dL (ref 0.3–1.2)

## 2013-09-08 LAB — CBC WITH DIFFERENTIAL/PLATELET
BASOS ABS: 0 10*3/uL (ref 0.0–0.1)
Basophils Relative: 0 % (ref 0–1)
Eosinophils Absolute: 0.2 10*3/uL (ref 0.0–0.7)
Eosinophils Relative: 4 % (ref 0–5)
HCT: 37.1 % (ref 36.0–46.0)
Hemoglobin: 12.5 g/dL (ref 12.0–15.0)
LYMPHS PCT: 38 % (ref 12–46)
Lymphs Abs: 2.2 10*3/uL (ref 0.7–4.0)
MCH: 28.4 pg (ref 26.0–34.0)
MCHC: 33.7 g/dL (ref 30.0–36.0)
MCV: 84.3 fL (ref 78.0–100.0)
Monocytes Absolute: 0.4 10*3/uL (ref 0.1–1.0)
Monocytes Relative: 6 % (ref 3–12)
NEUTROS ABS: 3.1 10*3/uL (ref 1.7–7.7)
NEUTROS PCT: 52 % (ref 43–77)
PLATELETS: 152 10*3/uL (ref 150–400)
RBC: 4.4 MIL/uL (ref 3.87–5.11)
RDW: 13.9 % (ref 11.5–15.5)
WBC: 5.9 10*3/uL (ref 4.0–10.5)

## 2013-09-08 LAB — PRO B NATRIURETIC PEPTIDE: PRO B NATRI PEPTIDE: 432.2 pg/mL — AB (ref 0–125)

## 2013-09-08 LAB — TROPONIN I

## 2013-09-08 MED ORDER — DM-GUAIFENESIN ER 30-600 MG PO TB12: 1.0000 | ORAL_TABLET | Freq: Two times a day (BID) | ORAL | Status: DC

## 2013-09-08 MED ORDER — CEPHALEXIN 500 MG PO CAPS: 500.0000 mg | ORAL_CAPSULE | Freq: Three times a day (TID) | ORAL | Status: DC

## 2013-09-08 NOTE — ED Notes (Signed)
The pt has had sob since she had a mi the first of April and was stented.  Her blood thinner has been changed she is now on plavix.  No chest pain.  She has been wheezing intermittently none now

## 2013-09-08 NOTE — Discharge Instructions (Signed)
Drink plenty of fluids. Take the medications as prescribed. Recheck if you get a fever, struggle to breathe, get chest pain or seem worse in any way.    Bronchitis Bronchitis is inflammation of the airways that extend from the windpipe into the lungs (bronchi). The inflammation often causes mucus to develop, which leads to a cough. If the inflammation becomes severe, it may cause shortness of breath. CAUSES  Bronchitis may be caused by:   Viral infections.   Bacteria.   Cigarette smoke.   Allergens, pollutants, and other irritants.  SIGNS AND SYMPTOMS  The most common symptom of bronchitis is a frequent cough that produces mucus. Other symptoms include:  Fever.   Body aches.   Chest congestion.   Chills.   Shortness of breath.   Sore throat.  DIAGNOSIS  Bronchitis is usually diagnosed through a medical history and physical exam. Tests, such as chest X-rays, are sometimes done to rule out other conditions.  TREATMENT  You may need to avoid contact with whatever caused the problem (smoking, for example). Medicines are sometimes needed. These may include:  Antibiotics. These may be prescribed if the condition is caused by bacteria.  Cough suppressants. These may be prescribed for relief of cough symptoms.   Inhaled medicines. These may be prescribed to help open your airways and make it easier for you to breathe.   Steroid medicines. These may be prescribed for those with recurrent (chronic) bronchitis. HOME CARE INSTRUCTIONS  Get plenty of rest.   Drink enough fluids to keep your urine clear or pale yellow (unless you have a medical condition that requires fluid restriction). Increasing fluids may help thin your secretions and will prevent dehydration.   Only take over-the-counter or prescription medicines as directed by your health care provider.  Only take antibiotics as directed. Make sure you finish them even if you start to feel better.  Avoid  secondhand smoke, irritating chemicals, and strong fumes. These will make bronchitis worse. If you are a smoker, quit smoking. Consider using nicotine gum or skin patches to help control withdrawal symptoms. Quitting smoking will help your lungs heal faster.   Put a cool-mist humidifier in your bedroom at night to moisten the air. This may help loosen mucus. Change the water in the humidifier daily. You can also run the hot water in your shower and sit in the bathroom with the door closed for 5 10 minutes.   Follow up with your health care provider as directed.   Wash your hands frequently to avoid catching bronchitis again or spreading an infection to others.  SEEK MEDICAL CARE IF: Your symptoms do not improve after 1 week of treatment.  SEEK IMMEDIATE MEDICAL CARE IF:  Your fever increases.  You have chills.   You have chest pain.   You have worsening shortness of breath.   You have bloody sputum.  You faint.  You have lightheadedness.  You have a severe headache.   You vomit repeatedly. MAKE SURE YOU:   Understand these instructions.  Will watch your condition.  Will get help right away if you are not doing well or get worse. Document Released: 05/02/2005 Document Revised: 02/20/2013 Document Reviewed: 12/25/2012 [facility] Patient Information 2014 [place].

## 2013-09-08 NOTE — ED Provider Notes (Signed)
CSN: [id number]     Arrival date & time 09/08/13  1536 History   First MD Initiated Contact with Patient 09/08/13 1559     Chief Complaint  Patient presents with  . Shortness of Breath     (Consider location/radiation/quality/duration/timing/severity/associated sxs/prior Treatment) HPI Patient states she had an MI in March and had 1 stent placed in 1 angioplasty by Dr. Pernell Dupre. She reports she was having shortness of breath after that stent was placed. She did when she would for her followup appointment on March 27 she described her shortness of breath and they stopped her brilinta and put her on Plavix and aspirin after which her shortness of breath got much better. She reports she has had a dry cough for the past week. She denies any fever. She states she had some mild achiness today. She denies rhinorrhea, sore throat, chest pain, nausea, or vomiting. She's had some diarrhea off and on. She denies palpitations or swelling in her legs or pain in her legs. She states last night she was short of breath all night and had difficulty sleeping. She reports she sometimes has dyspnea on exertion but not all the time. She also states she's had some mild hoarseness. She states the last time she felt this way she had bronchitis which she used to get yearly until the last episode which was 3 years ago. She states that time she coughed for 3-5 months. She states this morning when she got out of bed she felt short of breath and lightheaded in the shower but it rapidly resolved. She states she heard some wheezing last night although she is not wheezing now. She states she's never used an inhaler however she has gotten nebulizer treatments when hospitalized or in the ED.    PCP Dr Danise Mina Cardiology Dr Linard Millers  Past Medical History  Diagnosis Date  . History of chicken pox   . Depression   . Type 2 diabetes, uncontrolled, with neuropathy     dx 1993  . Chronic bronchitis   . Syncopal episodes   .  GERD (gastroesophageal reflux disease)   . Seasonal allergies   . High blood pressure   . HLD (hyperlipidemia)   . History of ovarian cyst   . Tubal pregnancy   . Tachyarrhythmia     ER visits with adenosine push to control  . CAD (coronary artery disease)     STEMI 07/2013 - treated with PTCA of the RCA followed by DES to the intermediate ramus  . Seizure 1977    off AEDs   Past Surgical History  Procedure Laterality Date  . Tonsillectomy and adenoidectomy  1965  . Ovarian cyst surgery  1998  . Dilation and curettage of uterus  1984  . Ectopic pregnancy surgery Right 1984    tube removal 2/2 hemorrhage  . Rotator cuff repair  2006    Right  . Cardiac catheterization  07/2013    angioplasty to RCA and DES to ramus intermedius   Family History  Problem Relation Age of Onset  . Diabetes Mother   . Hyperlipidemia Mother   . Hypertension Mother   . Stroke Mother   . Coronary artery disease Mother     CABG  . Heart disease Mother   . Diabetes Father   . Hypertension Father   . Hyperlipidemia Father   . Coronary artery disease Father     CABG  . Heart disease Father     CHF  . Pancreatic  cancer Father   . Heart disease Brother     arrhythmia  . Diabetes Maternal Grandmother   . Diabetes Maternal Grandfather   . Diabetes Paternal Grandmother   . Diabetes Paternal Grandfather   . Kidney disease Neg Hx    History  Substance Use Topics  . Smoking status: Never Smoker   . Smokeless tobacco: Never Used  . Alcohol Use: No   Employed   OB History   Grav Para Term Preterm Abortions TAB SAB Ect Mult Living                 Review of Systems  All other systems reviewed and are negative.     Allergies  Erythromycin; Lipitor; and Penicillins  Home Medications   Prior to Admission medications   Medication Sig Start Date End Date Taking? Authorizing Provider  acetaminophen (TYLENOL) 500 MG tablet Take 1,000 mg by mouth every 6 (six) hours as needed.   Yes  Historical Provider, MD  aspirin EC 81 MG EC tablet Take 1 tablet (81 mg total) by mouth daily. 07/25/13  Yes Sueanne Margarita, MD  citalopram (CELEXA) 20 MG tablet Take 1 tablet (20 mg total) by mouth daily. 04/25/13  Yes [name], MD  clopidogrel (PLAVIX) 75 MG tablet Take 1 tablet (75 mg total) by mouth daily. 08/09/13  Yes Burtis Junes, NP  famotidine (PEPCID) 20 MG tablet Take 1 tablet (20 mg total) by mouth 2 (two) times daily. 08/09/13  Yes Burtis Junes, NP  gabapentin (NEURONTIN) 100 MG capsule Take 1 capsule (100 mg total) by mouth at bedtime. 08/05/13  Yes [name], MD  insulin glargine (LANTUS) 100 UNIT/ML injection Inject 60 Units into the skin 2 (two) times daily.   Yes Historical Provider, MD  insulin regular (NOVOLIN R,HUMULIN R) 100 units/mL injection Inject 20 Units into the skin 3 (three) times daily before meals.   Yes Historical Provider, MD  lisinopril (ZESTRIL) 2.5 MG tablet Take 1 tablet (2.5 mg total) by mouth daily. 04/25/13  Yes [name], MD  metFORMIN (GLUCOPHAGE) 1000 MG tablet Take 1 tablet (1,000 mg total) by mouth 2 (two) times daily with a meal. Do not restart Metformin until Saturday am 3/14 07/25/13  Yes Sueanne Margarita, MD  metoprolol tartrate (LOPRESSOR) 25 MG tablet Take 1 tablet (25 mg total) by mouth 2 (two) times daily. 07/25/13  Yes Brittainy Simmons, PA-C  Simethicone (GAS-X PO) Take 2 tablets by mouth 4 (four) times daily as needed (gas).   Yes Historical Provider, MD  simvastatin (ZOCOR) 40 MG tablet Take 1 tablet (40 mg total) by mouth daily. 06/10/13  Yes [name], MD  insulin detemir (LEVEMIR) 100 UNIT/ML injection Inject 0.6 mLs (60 Units total) into the skin 2 (two) times daily 09/03/13   Tonia Ghent, MD  nitroGLYCERIN (NITROSTAT) 0.4 MG SL tablet Place 1 tablet (0.4 mg total) under the tongue every 5 (five) minutes as needed for chest pain. 07/25/13   Brittainy Simmons, PA-C   BP 136/64  Pulse 63  Temp(Src) 98.2 F  (36.8 C) (Oral)  Resp 16  Ht 5' (1.524 m)  Wt 188 lb 1 oz (85.305 kg)  BMI 36.73 kg/m2  SpO2 95%  Vital signs normal   Physical Exam  Nursing note and vitals reviewed. Constitutional: She is oriented to person, place, and time. She appears well-developed and well-nourished.  Non-toxic appearance. She does not appear ill. No distress.  HENT:  Head: Normocephalic and atraumatic.  Right  Ear: External ear normal.  Left Ear: External ear normal.  Nose: Nose normal. No mucosal edema or rhinorrhea.  Mouth/Throat: Oropharynx is clear and moist and mucous membranes are normal. No dental abscesses or uvula swelling.  Eyes: Conjunctivae and EOM are normal. Pupils are equal, round, and reactive to light.  Neck: Normal range of motion and full passive range of motion without pain. Neck supple.  Cardiovascular: Normal rate, regular rhythm and normal heart sounds.  Exam reveals no gallop and no friction rub.   No murmur heard. Pulmonary/Chest: Effort normal and breath sounds normal. No respiratory distress. She has no wheezes. She has no rhonchi. She has no rales. She exhibits no tenderness and no crepitus.  Abdominal: Soft. Normal appearance and bowel sounds are normal. She exhibits no distension. There is no tenderness. There is no rebound and no guarding.  Musculoskeletal: Normal range of motion. She exhibits no edema and no tenderness.  Moves all extremities well.   Neurological: She is alert and oriented to person, place, and time. She has normal strength. No cranial nerve deficit.  Skin: Skin is warm, dry and intact. No rash noted. No erythema. No pallor.  Psychiatric: She has a normal mood and affect. Her speech is normal and behavior is normal. Her mood appears not anxious.    ED Course  Procedures (including critical care time)  Medications - No data to display  Pt resting comfortably laying flat on her stretcher. States she is feeling fine. When asked what antibiotics she can take she  states Keflex.   Labs Review Results for orders placed during the hospital encounter of 09/08/13  CBC WITH DIFFERENTIAL      Result Value Ref Range   WBC 5.9  4.0 - 10.5 K/uL   RBC 4.40  3.87 - 5.11 MIL/uL   Hemoglobin 12.5  12.0 - 15.0 g/dL   HCT 37.1  36.0 - 46.0 %   MCV 84.3  78.0 - 100.0 fL   MCH 28.4  26.0 - 34.0 pg   MCHC 33.7  30.0 - 36.0 g/dL   RDW 13.9  11.5 - 15.5 %   Platelets 152  150 - 400 K/uL   Neutrophils Relative % 52  43 - 77 %   Neutro Abs 3.1  1.7 - 7.7 K/uL   Lymphocytes Relative 38  12 - 46 %   Lymphs Abs 2.2  0.7 - 4.0 K/uL   Monocytes Relative 6  3 - 12 %   Monocytes Absolute 0.4  0.1 - 1.0 K/uL   Eosinophils Relative 4  0 - 5 %   Eosinophils Absolute 0.2  0.0 - 0.7 K/uL   Basophils Relative 0  0 - 1 %   Basophils Absolute 0.0  0.0 - 0.1 K/uL  TROPONIN I      Result Value Ref Range   Troponin I <0.30  <0.30 ng/mL  PRO B NATRIURETIC PEPTIDE      Result Value Ref Range   Pro B Natriuretic peptide (BNP) 432.2 (*) 0 - 125 pg/mL  COMPREHENSIVE METABOLIC PANEL      Result Value Ref Range   Sodium 136 (*) 137 - 147 mEq/L   Potassium 4.2  3.7 - 5.3 mEq/L   Chloride 101  96 - 112 mEq/L   CO2 23  19 - 32 mEq/L   Glucose, Bld 228 (*) 70 - 99 mg/dL   BUN 14  6 - 23 mg/dL   Creatinine, Ser 0.50  0.50 - 1.10 mg/dL  Calcium 9.5  8.4 - 10.5 mg/dL   Total Protein 6.8  6.0 - 8.3 g/dL   Albumin 3.5  3.5 - 5.2 g/dL   AST 22  0 - 37 U/L   ALT 25  0 - 35 U/L   Alkaline Phosphatase 63  39 - 117 U/L   Total Bilirubin 0.3  0.3 - 1.2 mg/dL   GFR calc non Af Amer >90  >90 mL/min   GFR calc Af Amer >90  >90 mL/min   Laboratory interpretation all normal except hyperglycemia, mild elevation of BNP      Imaging Review Dg Chest 2 View  09/08/2013   CLINICAL DATA:  Shortness of breath, cough  EXAM: CHEST  2 VIEW  COMPARISON:  07/23/2013  FINDINGS: Lungs are essentially clear. No focal consolidation. No pleural effusion or pneumothorax.  Heart is normal in size.   Visualized osseous structures are within normal limits.  IMPRESSION: No evidence of acute cardiopulmonary disease.   Electronically Signed   By: Julian Hy M.D.   On: 09/08/2013 17:58     EKG Interpretation   Date/Time:  Sunday September 08 2013 15:51:27 EDT Ventricular Rate:  56 PR Interval:  146 QRS Duration: 78 QT Interval:  410 QTC Calculation: 395 R Axis:   41 Text Interpretation:  Sinus bradycardia Otherwise normal ECG No  significant change since last tracing Confirmed by Avayah Raffety  MD-I, Orel Hord  ([id number]) on 09/08/2013 4:21:16 PM      MDM   Final diagnoses:  Bronchitis    New Prescriptions   CEPHALEXIN (KEFLEX) 500 MG CAPSULE    Take 1 capsule (500 mg total) by mouth 3 (three) times daily.   DEXTROMETHORPHAN-GUAIFENESIN (MUCINEX DM) 30-600 MG PER 12 HR TABLET    Take 1 tablet by mouth 2 (two) times daily.   Plan discharge     Rolland Porter, MD, Alanson Aly, MD 09/08/13 [phone]

## 2013-09-10 ENCOUNTER — Encounter: Payer: Self-pay | Admitting: *Deleted

## 2013-09-10 ENCOUNTER — Other Ambulatory Visit: Payer: Self-pay | Admitting: *Deleted

## 2013-09-10 ENCOUNTER — Ambulatory Visit (INDEPENDENT_AMBULATORY_CARE_PROVIDER_SITE_OTHER): Payer: No Typology Code available for payment source | Admitting: Family Medicine

## 2013-09-10 ENCOUNTER — Encounter: Payer: Self-pay | Admitting: Family Medicine

## 2013-09-10 VITALS — BP 136/78 | HR 60 | Temp 97.7°F | Wt 190.5 lb

## 2013-09-10 DIAGNOSIS — J209 Acute bronchitis, unspecified: Secondary | ICD-10-CM | POA: Insufficient documentation

## 2013-09-10 MED ORDER — GABAPENTIN 100 MG PO CAPS: 100.0000 mg | ORAL_CAPSULE | Freq: Every day | ORAL | Status: DC

## 2013-09-10 MED ORDER — SIMVASTATIN 40 MG PO TABS: 40.0000 mg | ORAL_TABLET | Freq: Every day | ORAL | Status: DC

## 2013-09-10 MED ORDER — METOPROLOL TARTRATE 25 MG PO TABS: 25.0000 mg | ORAL_TABLET | Freq: Two times a day (BID) | ORAL | Status: DC

## 2013-09-10 MED ORDER — LISINOPRIL 2.5 MG PO TABS: 2.5000 mg | ORAL_TABLET | Freq: Every day | ORAL | Status: DC

## 2013-09-10 MED ORDER — CITALOPRAM HYDROBROMIDE 20 MG PO TABS: 20.0000 mg | ORAL_TABLET | Freq: Every day | ORAL | Status: DC

## 2013-09-10 MED ORDER — HYDROCODONE-HOMATROPINE 5-1.5 MG/5ML PO SYRP: 5.0000 mL | ORAL_SOLUTION | Freq: Every evening | ORAL | Status: DC | PRN

## 2013-09-10 MED ORDER — PANTOPRAZOLE SODIUM 40 MG PO TBEC: 40.0000 mg | DELAYED_RELEASE_TABLET | Freq: Every day | ORAL | Status: DC

## 2013-09-10 NOTE — Progress Notes (Signed)
BP 136/78  Pulse 60  Temp(Src) 97.7 F (36.5 C) (Oral)  Wt 190 lb 8 oz (86.41 kg)  SpO2 97%   CC: ER f/u  Subjective:    Patient ID: Karen [name], female    DOB: 11-24-1955, 58 y.o.   MRN: [id number]  HPI: Karen [name] is a 58 y.o. female presenting on 09/10/2013 for Follow-up and Shortness of Breath   Seen 2d ago at [facility] ER with 1wk h/o dry cough, dx bronchitis, treated with keflex 500mg  tid and mucinex DM.  Keflex causing nausea, lightheadedness, diaphoresis.  Not feeling well with keflex.  No chest pain with this.  Denies dyspnea currently, but finds some orthopnea at night time along with some hoarseness.  Also endorses wheezing worse at night and early morning.  Endorses persistent deep dry cough.  No recent fevers/chills.  Wonders if needs different antibiotic.  Did not fill mucinex DM. Lab Results  Component Value Date   CREATININE 0.50 09/08/2013  BNP 400s in ER.  Nonsmoker but works around smokers.  Imaging Review  Dg Chest 2 View  09/08/2013 CLINICAL DATA: Shortness of breath, cough EXAM: CHEST 2 VIEW COMPARISON: 07/23/2013 FINDINGS: Lungs are essentially clear. No focal consolidation. No pleural effusion or pneumothorax. Heart is normal in size. Visualized osseous structures are within normal limits. IMPRESSION: No evidence of acute cardiopulmonary disease. Electronically Signed By: Julian Hy M.D. On: 09/08/2013 17:58    Relevant recent h/o inferior STEMI s/p PTCA RCA then DES to ramus intermedius (07/25/2013).  brilinta caused dyspnea, better tolerating plavix.   Relevant past medical, surgical, family and social history reviewed and updated as indicated.  Allergies and medications reviewed and updated. Current Outpatient Prescriptions on File Prior to Visit  Medication Sig  . acetaminophen (TYLENOL) 500 MG tablet Take 1,000 mg by mouth every 6 (six) hours as needed.  Marland Kitchen aspirin EC 81 MG EC tablet Take 1 tablet (81 mg total) by mouth daily.  . cephALEXin  (KEFLEX) 500 MG capsule Take 1 capsule (500 mg total) by mouth 3 (three) times daily.  . citalopram (CELEXA) 20 MG tablet Take 1 tablet (20 mg total) by mouth daily.  . clopidogrel (PLAVIX) 75 MG tablet Take 1 tablet (75 mg total) by mouth daily.  Marland Kitchen dextromethorphan-guaiFENesin (MUCINEX DM) 30-600 MG per 12 hr tablet Take 1 tablet by mouth 2 (two) times daily.  Marland Kitchen gabapentin (NEURONTIN) 100 MG capsule Take 1 capsule (100 mg total) by mouth at bedtime.  . insulin detemir (LEVEMIR) 100 UNIT/ML injection Inject 0.6 mLs (60 Units total) into the skin 2 (two) times daily  . insulin glargine (LANTUS) 100 UNIT/ML injection Inject 60 Units into the skin 2 (two) times daily.  . insulin regular (NOVOLIN R,HUMULIN R) 100 units/mL injection Inject 20 Units into the skin 3 (three) times daily before meals.  Marland Kitchen lisinopril (ZESTRIL) 2.5 MG tablet Take 1 tablet (2.5 mg total) by mouth daily.  . metFORMIN (GLUCOPHAGE) 1000 MG tablet Take 1 tablet (1,000 mg total) by mouth 2 (two) times daily with a meal. Do not restart Metformin until Saturday am 3/14  . metoprolol tartrate (LOPRESSOR) 25 MG tablet Take 1 tablet (25 mg total) by mouth 2 (two) times daily.  . nitroGLYCERIN (NITROSTAT) 0.4 MG SL tablet Place 1 tablet (0.4 mg total) under the tongue every 5 (five) minutes as needed for chest pain.  . Simethicone (GAS-X PO) Take 2 tablets by mouth 4 (four) times daily as needed (gas).  . simvastatin (ZOCOR) 40 MG  tablet Take 1 tablet (40 mg total) by mouth daily.   No current facility-administered medications on file prior to visit.    Review of Systems Per HPI unless specifically indicated above    Objective:    BP 136/78  Pulse 60  Temp(Src) 97.7 F (36.5 C) (Oral)  Wt 190 lb 8 oz (86.41 kg)  SpO2 97%  Physical Exam  Nursing note and vitals reviewed. Constitutional: She appears well-developed and well-nourished. No distress.  HENT:  Head: Normocephalic and atraumatic.  Right Ear: Hearing, tympanic  membrane, external ear and ear canal normal.  Left Ear: Hearing, tympanic membrane, external ear and ear canal normal.  Nose: No mucosal edema or rhinorrhea. Right sinus exhibits no maxillary sinus tenderness and no frontal sinus tenderness. Left sinus exhibits no maxillary sinus tenderness and no frontal sinus tenderness.  Mouth/Throat: Uvula is midline, oropharynx is clear and moist and mucous membranes are normal. No oropharyngeal exudate, posterior oropharyngeal edema, posterior oropharyngeal erythema or tonsillar abscesses.  Eyes: Conjunctivae and EOM are normal. Pupils are equal, round, and reactive to light. No scleral icterus.  Neck: Normal range of motion. Neck supple.  Cardiovascular: Normal rate, regular rhythm, normal heart sounds and intact distal pulses.   No murmur heard. Pulmonary/Chest: Effort normal and breath sounds normal. No respiratory distress. She has no wheezes. She has no rales.  Lungs clear, no exp wheezing even with forced expiration  Lymphadenopathy:    She has no cervical adenopathy.  Skin: Skin is warm and dry. No rash noted.       Assessment & Plan:   Problem List Items Addressed This Visit   Acute bronchitis - Primary     Anticipate persistent bronchitis but I'm not convinced there was bacterial infection to start, rather likely viral infection. Stop keflex as it may be causing nausea. For future reference, pt states doxy too expensive as well. Supportive care as per instructions with hycodan for night time cough. Update if red flags for bacterial infection        Follow up plan: Return if symptoms worsen or fail to improve.

## 2013-09-10 NOTE — Assessment & Plan Note (Addendum)
Anticipate persistent bronchitis but I'm not convinced there was bacterial infection to start, rather likely viral infection. Stop keflex as it may be causing nausea. For future reference, pt states doxy too expensive as well. Supportive care as per instructions with hycodan for night time cough. Update if red flags for bacterial infection

## 2013-09-10 NOTE — Progress Notes (Signed)
Pre visit review using our clinic review tool, if applicable. No additional management support is needed unless otherwise documented below in the visit note. 

## 2013-09-10 NOTE — Patient Instructions (Addendum)
Keep appointment 5/19 with cardiology Stop keflex.  I don't think we have infection, I think you have viral bronchitis. Start hydrocodone cough syrup nightly for cough - may make you sleepy. Let us know if fever >101 or worsening productive cough. Good to see you today, call us with questions. May try protonix for reflux - ok with plavix.

## 2013-09-11 ENCOUNTER — Other Ambulatory Visit: Payer: Self-pay | Admitting: *Deleted

## 2013-09-11 MED ORDER — GABAPENTIN 100 MG PO CAPS: 100.0000 mg | ORAL_CAPSULE | Freq: Every day | ORAL | Status: DC

## 2013-09-12 ENCOUNTER — Telehealth (HOSPITAL_COMMUNITY): Payer: Self-pay | Admitting: *Deleted

## 2013-09-15 ENCOUNTER — Emergency Department (HOSPITAL_COMMUNITY)
Admission: EM | Admit: 2013-09-15 | Discharge: 2013-09-15 | Disposition: A | Discharge status: Home / Self Care | Payer: No Typology Code available for payment source | Attending: Emergency Medicine | Admitting: Emergency Medicine

## 2013-09-15 ENCOUNTER — Encounter (HOSPITAL_COMMUNITY): Payer: Self-pay | Admitting: Emergency Medicine

## 2013-09-15 DIAGNOSIS — R0602 Shortness of breath: Secondary | ICD-10-CM | POA: Diagnosis not present

## 2013-09-15 DIAGNOSIS — K219 Gastro-esophageal reflux disease without esophagitis: Secondary | ICD-10-CM | POA: Insufficient documentation

## 2013-09-15 DIAGNOSIS — I2510 Atherosclerotic heart disease of native coronary artery without angina pectoris: Secondary | ICD-10-CM

## 2013-09-15 DIAGNOSIS — E1142 Type 2 diabetes mellitus with diabetic polyneuropathy: Secondary | ICD-10-CM | POA: Diagnosis not present

## 2013-09-15 DIAGNOSIS — Z8619 Personal history of other infectious and parasitic diseases: Secondary | ICD-10-CM | POA: Diagnosis not present

## 2013-09-15 DIAGNOSIS — F329 Major depressive disorder, single episode, unspecified: Secondary | ICD-10-CM | POA: Insufficient documentation

## 2013-09-15 DIAGNOSIS — Z8709 Personal history of other diseases of the respiratory system: Secondary | ICD-10-CM | POA: Diagnosis not present

## 2013-09-15 DIAGNOSIS — F3289 Other specified depressive episodes: Secondary | ICD-10-CM | POA: Insufficient documentation

## 2013-09-15 DIAGNOSIS — Z8742 Personal history of other diseases of the female genital tract: Secondary | ICD-10-CM | POA: Diagnosis not present

## 2013-09-15 DIAGNOSIS — Z7902 Long term (current) use of antithrombotics/antiplatelets: Secondary | ICD-10-CM | POA: Diagnosis not present

## 2013-09-15 DIAGNOSIS — Z7982 Long term (current) use of aspirin: Secondary | ICD-10-CM | POA: Diagnosis not present

## 2013-09-15 DIAGNOSIS — R079 Chest pain, unspecified: Secondary | ICD-10-CM | POA: Diagnosis present

## 2013-09-15 DIAGNOSIS — Z794 Long term (current) use of insulin: Secondary | ICD-10-CM | POA: Diagnosis not present

## 2013-09-15 DIAGNOSIS — E785 Hyperlipidemia, unspecified: Secondary | ICD-10-CM | POA: Diagnosis not present

## 2013-09-15 DIAGNOSIS — Z792 Long term (current) use of antibiotics: Secondary | ICD-10-CM | POA: Insufficient documentation

## 2013-09-15 DIAGNOSIS — R0789 Other chest pain: Secondary | ICD-10-CM | POA: Diagnosis not present

## 2013-09-15 DIAGNOSIS — Z880 Allergy status to penicillin: Secondary | ICD-10-CM | POA: Insufficient documentation

## 2013-09-15 DIAGNOSIS — Z9889 Other specified postprocedural states: Secondary | ICD-10-CM | POA: Insufficient documentation

## 2013-09-15 DIAGNOSIS — E1149 Type 2 diabetes mellitus with other diabetic neurological complication: Secondary | ICD-10-CM | POA: Insufficient documentation

## 2013-09-15 DIAGNOSIS — Z79899 Other long term (current) drug therapy: Secondary | ICD-10-CM | POA: Diagnosis not present

## 2013-09-15 DIAGNOSIS — G40909 Epilepsy, unspecified, not intractable, without status epilepticus: Secondary | ICD-10-CM | POA: Insufficient documentation

## 2013-09-15 DIAGNOSIS — Z9861 Coronary angioplasty status: Secondary | ICD-10-CM

## 2013-09-15 LAB — BASIC METABOLIC PANEL
BUN: 11 mg/dL (ref 6–23)
CO2: 24 mEq/L (ref 19–32)
Calcium: 8.6 mg/dL (ref 8.4–10.5)
Chloride: 103 mEq/L (ref 96–112)
Creatinine, Ser: 0.64 mg/dL (ref 0.50–1.10)
GFR calc Af Amer: >90 mL/min (ref >90)
GFR calc non Af Amer: >90 mL/min (ref >90)
GLUCOSE: 154 mg/dL — AB (ref 70–99)
POTASSIUM: 4.4 meq/L (ref 3.7–5.3)
SODIUM: 141 meq/L (ref 137–147)

## 2013-09-15 LAB — I-STAT TROPONIN, ED: Troponin i, poc: 0 ng/mL (ref 0.00–0.08)

## 2013-09-15 LAB — CBC
HEMATOCRIT: 34.2 % — AB (ref 36.0–46.0)
HEMOGLOBIN: 11.1 g/dL — AB (ref 12.0–15.0)
MCH: 28 pg (ref 26.0–34.0)
MCHC: 32.5 g/dL (ref 30.0–36.0)
MCV: 86.4 fL (ref 78.0–100.0)
Platelets: 137 10*3/uL — ABNORMAL LOW (ref 150–400)
RBC: 3.96 MIL/uL (ref 3.87–5.11)
RDW: 13.9 % (ref 11.5–15.5)
WBC: 4.8 10*3/uL (ref 4.0–10.5)

## 2013-09-15 MED ORDER — POTASSIUM CHLORIDE ER 10 MEQ PO CPCR: 10.0000 meq | ORAL_CAPSULE | Freq: Every day | ORAL | Status: DC

## 2013-09-15 MED ORDER — NITROGLYCERIN 0.4 MG SL SUBL
0.4000 mg | SUBLINGUAL_TABLET | SUBLINGUAL | Status: DC | PRN
  Administered 2013-09-15: 0.4 mg via SUBLINGUAL
  Filled 2013-09-15: qty 1

## 2013-09-15 MED ORDER — FUROSEMIDE 40 MG PO TABS: 20.0000 mg | ORAL_TABLET | Freq: Every day | ORAL | Status: DC

## 2013-09-15 MED ORDER — FUROSEMIDE 20 MG PO TABS
40.0000 mg | ORAL_TABLET | Freq: Once | ORAL | Status: AC
  Administered 2013-09-15: 40 mg via ORAL
  Filled 2013-09-15: qty 2

## 2013-09-15 MED ORDER — ASPIRIN 81 MG PO CHEW
324.0000 mg | CHEWABLE_TABLET | Freq: Once | ORAL | Status: AC
  Administered 2013-09-15: 324 mg via ORAL
  Filled 2013-09-15: qty 4

## 2013-09-15 MED ORDER — POTASSIUM CHLORIDE ER 10 MEQ PO TBCR
10.0000 meq | EXTENDED_RELEASE_TABLET | Freq: Every day | ORAL | Status: DC
  Filled 2013-09-15: qty 1

## 2013-09-15 NOTE — ED Notes (Signed)
Pt reports when she woke up this morning she felt what she describes as indigestion. Pain is in center of chest and is burning in nature. Pt reports shortness of breath and that she feels like she is suffocating. Pt reports seen here last Sunday thinking she had bronchitis.

## 2013-09-15 NOTE — ED Provider Notes (Signed)
CSN: [id number]     Arrival date & time 09/15/13  1157 History   First MD Initiated Contact with Patient 09/15/13 1252     Chief Complaint  Patient presents with  . Shortness of Breath  . Chest Pain     (Consider location/radiation/quality/duration/timing/severity/associated sxs/prior Treatment) HPI Comments: Patient with h/o STEMI in 03/15 status post stent placement initially on Brillinta then later Plavix -- presents with complaint of shortness of breath with exertion, chest pressure, and a smothering feeling that is worse after she eats. Patient states that she has been having shortness of breath since her stent placement. Her doctors told her that the shortness of breath was due to the [place], which was then changed to Plavix. Chest pressure has been intermittent but worse this morning lasting for several hours. This is associated with shortness of breath, mostly with exertion but even at rest. Patient was seen in emergency department one week ago with similar symptoms, diagnosed with bronchitis. She followed up with her doctor several days ago and agreed. Patient returns to the emergency department today because symptoms were worse. Patient had some chest pressure with STEMI mostly indigestion sensation with radiation to shoulders and neck. The onset of this condition was acute. The course is intermittent. Aggravating factors: exertion. Alleviating factors: none.    The history is provided by the patient and medical records.    Past Medical History  Diagnosis Date  . History of chicken pox   . Depression   . Type 2 diabetes, uncontrolled, with neuropathy     dx 1993  . Chronic bronchitis   . Syncopal episodes   . GERD (gastroesophageal reflux disease)   . Seasonal allergies   . High blood pressure   . HLD (hyperlipidemia)   . History of ovarian cyst   . Tubal pregnancy   . Tachyarrhythmia     ER visits with adenosine push to control  . CAD (coronary artery disease)     STEMI  07/2013 - treated with PTCA of the RCA followed by DES to the intermediate ramus  . Seizure 1977    off AEDs   Past Surgical History  Procedure Laterality Date  . Tonsillectomy and adenoidectomy  1965  . Ovarian cyst surgery  1998  . Dilation and curettage of uterus  1984  . Ectopic pregnancy surgery Right 1984    tube removal 2/2 hemorrhage  . Rotator cuff repair  2006    Right  . Cardiac catheterization  07/2013    angioplasty to RCA and DES to ramus intermedius   Family History  Problem Relation Age of Onset  . Diabetes Mother   . Hyperlipidemia Mother   . Hypertension Mother   . Stroke Mother   . Coronary artery disease Mother     CABG  . Heart disease Mother   . Diabetes Father   . Hypertension Father   . Hyperlipidemia Father   . Coronary artery disease Father     CABG  . Heart disease Father     CHF  . Pancreatic cancer Father   . Heart disease Brother     arrhythmia  . Diabetes Maternal Grandmother   . Diabetes Maternal Grandfather   . Diabetes Paternal Grandmother   . Diabetes Paternal Grandfather   . Kidney disease Neg Hx    History  Substance Use Topics  . Smoking status: Never Smoker   . Smokeless tobacco: Never Used  . Alcohol Use: No   OB History   Grav  Para Term Preterm Abortions TAB SAB Ect Mult Living                 Review of Systems  Constitutional: Negative for fever and diaphoresis.  HENT: Negative for rhinorrhea and sore throat.   Eyes: Negative for redness.  Respiratory: Positive for shortness of breath. Negative for cough.   Cardiovascular: Positive for chest pain ('pressure'). Negative for palpitations and leg swelling.  Gastrointestinal: Negative for nausea, vomiting, abdominal pain and diarrhea.  Genitourinary: Negative for dysuria.  Musculoskeletal: Negative for back pain, myalgias and neck pain.  Skin: Negative for rash.  Neurological: Negative for syncope, light-headedness and headaches.    Allergies  Erythromycin; Lipitor;  and Penicillins  Home Medications   Prior to Admission medications   Medication Sig Start Date End Date Taking? Authorizing Provider  acetaminophen (TYLENOL) 500 MG tablet Take 1,000 mg by mouth every 6 (six) hours as needed.    Historical Provider, MD  aspirin EC 81 MG EC tablet Take 1 tablet (81 mg total) by mouth daily. 07/25/13   Sueanne Margarita, MD  cephALEXin (KEFLEX) 500 MG capsule Take 1 capsule (500 mg total) by mouth 3 (three) times daily. 09/08/13   Janice Norrie, MD  citalopram (CELEXA) 20 MG tablet Take 1 tablet (20 mg total) by mouth daily. 09/10/13   [name], MD  citalopram (CELEXA) 20 MG tablet Take 1 tablet (20 mg total) by mouth daily. 09/10/13   [name], MD  clopidogrel (PLAVIX) 75 MG tablet Take 1 tablet (75 mg total) by mouth daily. 08/09/13   Burtis Junes, NP  dextromethorphan-guaiFENesin [facility] DM) 30-600 MG per 12 hr tablet Take 1 tablet by mouth 2 (two) times daily. 09/08/13   Janice Norrie, MD  gabapentin (NEURONTIN) 100 MG capsule Take 1 capsule (100 mg total) by mouth at bedtime. 09/11/13   [name], MD  HYDROcodone-homatropine [facility]) 5-1.5 MG/5ML syrup Take 5 mLs by mouth at bedtime as needed for cough (sedation precautions). 09/10/13   [name], MD  insulin detemir (LEVEMIR) 100 UNIT/ML injection Inject 0.6 mLs (60 Units total) into the skin 2 (two) times daily 09/03/13   Tonia Ghent, MD  insulin glargine (LANTUS) 100 UNIT/ML injection Inject 60 Units into the skin 2 (two) times daily.    Historical Provider, MD  insulin regular (NOVOLIN R,HUMULIN R) 100 units/mL injection Inject 20 Units into the skin 3 (three) times daily before meals.    Historical Provider, MD  lisinopril (ZESTRIL) 2.5 MG tablet Take 1 tablet (2.5 mg total) by mouth daily. 09/10/13   [name], MD  metFORMIN (GLUCOPHAGE) 1000 MG tablet Take 1 tablet (1,000 mg total) by mouth 2 (two) times daily with a meal. Do not restart Metformin until Saturday am 3/14  07/25/13   Sueanne Margarita, MD  metoprolol tartrate (LOPRESSOR) 25 MG tablet Take 1 tablet (25 mg total) by mouth 2 (two) times daily. 09/10/13   [name], MD  nitroGLYCERIN (NITROSTAT) 0.4 MG SL tablet Place 1 tablet (0.4 mg total) under the tongue every 5 (five) minutes as needed for chest pain. 07/25/13   Brittainy Simmons, PA-C  pantoprazole (PROTONIX) 40 MG tablet Take 1 tablet (40 mg total) by mouth daily. 09/10/13   [name], MD  Simethicone (GAS-X PO) Take 2 tablets by mouth 4 (four) times daily as needed (gas).    Historical Provider, MD  simvastatin (ZOCOR) 40 MG tablet Take 1 tablet (40 mg total) by mouth daily. 09/10/13  [name], MD   BP 128/54  Pulse 61  Temp(Src) 98.2 F (36.8 C) (Oral)  Resp 18  Ht 5' (1.524 m)  Wt 190 lb 2 oz (86.24 kg)  BMI 37.13 kg/m2  SpO2 95%  Physical Exam  Nursing note and vitals reviewed. Constitutional: She appears well-developed and well-nourished.  HENT:  Head: Normocephalic and atraumatic.  Mouth/Throat: Mucous membranes are normal. Mucous membranes are not dry.  Eyes: Conjunctivae are normal. Right eye exhibits no discharge. Left eye exhibits no discharge.  Neck: Trachea normal and normal range of motion. Neck supple. Normal carotid pulses and no JVD present. No muscular tenderness present. Carotid bruit is not present. No tracheal deviation present.  Cardiovascular: Normal rate, regular rhythm, S1 normal, S2 normal, normal heart sounds and intact distal pulses.  Exam reveals no decreased pulses.   No murmur heard. Pulmonary/Chest: Effort normal and breath sounds normal. No respiratory distress. She has no wheezes. She exhibits no tenderness.  Abdominal: Soft. Normal aorta and bowel sounds are normal. There is no tenderness. There is no rebound and no guarding.  Musculoskeletal: Normal range of motion.  Neurological: She is alert.  Skin: Skin is warm and dry. She is not diaphoretic. No cyanosis. No pallor.  Psychiatric:  She has a normal mood and affect.    ED Course  Procedures (including critical care time) Labs Review Labs Reviewed  CBC - Abnormal; Notable for the following:    Hemoglobin 11.1 (*)    HCT 34.2 (*)    Platelets 137 (*)    All other components within normal limits  BASIC METABOLIC PANEL - Abnormal; Notable for the following:    Glucose, Bld 154 (*)    All other components within normal limits  I-STAT TROPOININ, ED    Imaging Review No results found.   EKG Interpretation   Date/Time:  Sunday Sep 15 2013 12:01:40 EDT Ventricular Rate:  65 PR Interval:  148 QRS Duration: 66 QT Interval:  412 QTC Calculation: 428 R Axis:   57 Text Interpretation:  Normal sinus rhythm Cannot rule out Anterior infarct  , age undetermined Abnormal ECG Confirmed by WARD,  DO, KRISTEN ([id number]) on  09/15/2013 3:14:22 PM      1:29 PM Patient seen and examined. Work-up initiated. Medications ordered.   Vital signs reviewed and are as follows: Filed Vitals:   09/15/13 1205  BP: 128/54  Pulse: 61  Temp: 98.2 F (36.8 C)  Resp: 18   3:30 PM Patient d/w and seen by Dr. Leonides Schanz. Will ask cardiology to consult, given very vague symptoms and h/o STEMI, now 3rd medical visit for this problem.   3:56 PM Spoke with Dr. Domenic Polite who will see.   Handoff to Dr. Maryan Rued at shift change.   MDM   Final diagnoses:  Shortness of breath  Chest pressure   Pending completion of work-up as well as cards evaluation. No signs of active ischemia to this point in work-up.     Carlisle Cater, PA-C 09/15/13 1557

## 2013-09-15 NOTE — ED Provider Notes (Addendum)
Medical screening examination/treatment/procedure(s) were conducted as a shared visit with non-physician practitioner(s) and myself.  I personally evaluated the patient during the encounter.   EKG Interpretation   Date/Time:  Sunday Sep 15 2013 12:01:40 EDT Ventricular Rate:  65 PR Interval:  148 QRS Duration: 66 QT Interval:  412 QTC Calculation: 428 R Axis:   57 Text Interpretation:  Normal sinus rhythm Cannot rule out Anterior infarct  , age undetermined Abnormal ECG Confirmed by Waylyn Tenbrink,  DO, Kadin Canipe [phone]) on  09/15/2013 3:14:22 PM      Pt is a 58 y.o. F with history of insulin-dependent diabetes, hypertension, STEMI in March 2015 with stent placement who is currently on Plavix presents emergency department with diffuse chest tightness without radiation and shortness of breath. Patient reports that she has had shortness of breath since her cardiac catheterization. She states her chest tightness feels similar to when she had her prior MI that does not radiate. She describes it is worse when she eats and she feels that everything is tight and she feels very full. No abdominal pain. History is limited as patient is a very poor historian. Her exam is unremarkable. She is hemodynamically stable. EKG shows no new ischemic changes. Her labs are unremarkable. Troponin negative. Plan is to discuss with cardiology to evaluate patient in the emergency department given she had recent STEMI with stent placement.  4:40 PM  Dr. Sallyanne Kuster has seen and feels patient is safe to be discharged home. He thinks this may be mild CHF exacerbation. He does not feel he needs a further workup in the ED. He is discharging the patient home on 20 mg Lasix daily and potassium supplement - he has written these prescriptions. She has followup with Dr. Tamala Julian in 2 weeks. Discussed return precautions. Patient verbalizes understanding and is comfortable plan  [place], DO 09/15/13 [place], DO 09/15/13 1644

## 2013-09-15 NOTE — Discharge Instructions (Signed)
Chest Pain (Nonspecific) °It is often hard to give a specific diagnosis for the cause of chest pain. There is always a chance that your pain could be related to something serious, such as a heart attack or a blood clot in the lungs. You need to follow up with your caregiver for further evaluation. °CAUSES  °· Heartburn. °· Pneumonia or bronchitis. °· Anxiety or stress. °· Inflammation around your heart (pericarditis) or lung (pleuritis or pleurisy). °· A blood clot in the lung. °· A collapsed lung (pneumothorax). It can develop suddenly on its own (spontaneous pneumothorax) or from injury (trauma) to the chest. °· Shingles infection (herpes zoster virus). °The chest wall is composed of bones, muscles, and cartilage. Any of these can be the source of the pain. °· The bones can be bruised by injury. °· The muscles or cartilage can be strained by coughing or overwork. °· The cartilage can be affected by inflammation and become sore (costochondritis). °DIAGNOSIS  °Lab tests or other studies, such as X-rays, electrocardiography, stress testing, or cardiac imaging, may be needed to find the cause of your pain.  °TREATMENT  °· Treatment depends on what may be causing your chest pain. Treatment may include: °· Acid blockers for heartburn. °· Anti-inflammatory medicine. °· Pain medicine for inflammatory conditions. °· Antibiotics if an infection is present. °· You may be advised to change lifestyle habits. This includes stopping smoking and avoiding alcohol, caffeine, and chocolate. °· You may be advised to keep your head raised (elevated) when sleeping. This reduces the chance of acid going backward from your stomach into your esophagus. °· Most of the time, nonspecific chest pain will improve within 2 to 3 days with rest and mild pain medicine. °HOME CARE INSTRUCTIONS  °· If antibiotics were prescribed, take your antibiotics as directed. Finish them even if you start to feel better. °· For the next few days, avoid physical  activities that bring on chest pain. Continue physical activities as directed. °· Do not smoke. °· Avoid drinking alcohol. °· Only take over-the-counter or prescription medicine for pain, discomfort, or fever as directed by your caregiver. °· Follow your caregiver's suggestions for further testing if your chest pain does not go away. °· Keep any follow-up appointments you made. If you do not go to an appointment, you could develop lasting (chronic) problems with pain. If there is any problem keeping an appointment, you must call to reschedule. °SEEK MEDICAL CARE IF:  °· You think you are having problems from the medicine you are taking. Read your medicine instructions carefully. °· Your chest pain does not go away, even after treatment. °· You develop a rash with blisters on your chest. °SEEK IMMEDIATE MEDICAL CARE IF:  °· You have increased chest pain or pain that spreads to your arm, neck, jaw, back, or abdomen. °· You develop shortness of breath, an increasing cough, or you are coughing up blood. °· You have severe back or abdominal pain, feel nauseous, or vomit. °· You develop severe weakness, fainting, or chills. °· You have a fever. °THIS IS AN EMERGENCY. Do not wait to see if the pain will go away. Get medical help at once. Call your local emergency services (911 in [place] Do not drive yourself to the hospital. °MAKE SURE YOU:  °· Understand these instructions. °· Will watch your condition. °· Will get help right away if you are not doing well or get worse. °Document Released: 02/09/2005 Document Revised: 07/25/2011 Document Reviewed: 12/06/2007 °[employer]® Patient Information ©2014 [employer]   [employer]. ° °

## 2013-09-15 NOTE — Consult Note (Signed)
Reason for Consult: Dyspnea  Requesting Physician: Ward  Cardiologist: Linard Millers  HPI: This is a 58 y.o. female with a past medical history significant for inferior wall STEMI treated with angioplasty, followed by a DES to ramus intermedius when she developed anterior ST elevation less than 24 later. Both times the intervention was prompt and there was virtually no true injury (peak troponin 0.5). Echo showed normal LVEF and wall motion. She does have LVH, although Doppler parameters were pretty normal.  She has subsequently sought medical attention 3 times for a smothering sensation, including today. This is worse with activity and consistently worse when she lies down at night, resolves after walking around in the morning. Stopping the Brilinta (now on Plavix) did not help. She has mild chest pressure, but this is clearly a minor complaint. 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   She feels bloated all the time and has early satiety. Her PO intake has not changed, but she has gained 10 lb.  ECG and enzymes are low risk.  PMHx:  Past Medical History  Diagnosis Date  . History of chicken pox   . Depression   . Type 2 diabetes, uncontrolled, with neuropathy     dx 1993  . Chronic bronchitis   . Syncopal episodes   . GERD (gastroesophageal reflux disease)   . Seasonal allergies   . High blood pressure   . HLD (hyperlipidemia)   . History of ovarian cyst   . Tubal pregnancy   . Tachyarrhythmia     ER visits with adenosine push to control  . CAD (coronary artery disease)     STEMI 07/2013 - treated with PTCA of the RCA followed by DES to the intermediate ramus  . Seizure 1977    off AEDs   Past Surgical History  Procedure Laterality Date  . Tonsillectomy and adenoidectomy  1965  . Ovarian cyst surgery  1998  . Dilation and curettage of uterus  1984  . Ectopic pregnancy surgery Right 1984    tube removal 2/2 hemorrhage  . Rotator cuff  repair  2006    Right  . Cardiac catheterization  07/2013    angioplasty to RCA and DES to ramus intermedius    FAMHx: Family History  Problem Relation Age of Onset  . Diabetes Mother   . Hyperlipidemia Mother   . Hypertension Mother   . Stroke Mother   . Coronary artery disease Mother     CABG  . Heart disease Mother   . Diabetes Father   . Hypertension Father   . Hyperlipidemia Father   . Coronary artery disease Father     CABG  . Heart disease Father     CHF  . Pancreatic cancer Father   . Heart disease Brother     arrhythmia  . Diabetes Maternal Grandmother   . Diabetes Maternal Grandfather   . Diabetes Paternal Grandmother   . Diabetes Paternal Grandfather   . Kidney disease Neg Hx     SOCHx:  reports that she has never smoked. She has never used smokeless tobacco. She reports that she does not drink alcohol or use illicit drugs.  ALLERGIES: Allergies  Allergen Reactions  . Erythromycin Other (See Comments)    Stomach cramps  . Lipitor [Atorvastatin] Nausea Only  . Penicillins Rash    ROS: Constitutional: negative for anorexia, chills, fevers, night sweats and positive for weight gain Eyes: negative Ears, nose,  mouth, throat, and face: negative Respiratory: positive for dyspnea on exertion Cardiovascular: positive for chest pressure/discomfort and dyspnea, negative for orthopnea Gastrointestinal: negative Integument/breast: negative for rash Hematologic/lymphatic: negative for bleeding, easy bruising and petechiae Musculoskeletal:negative Neurological: negative Behavioral/Psych: negative Endocrine: negative Allergic/Immunologic: negative  HOME MEDICATIONS:  (Not in a hospital admission)  HOSPITAL MEDICATIONS: I have reviewed the patient's current medications. Prior to Admission:  (Not in a hospital admission)  VITALS: Blood pressure 128/54, pulse 61, temperature 98.2 F (36.8 C), temperature source Oral, resp. rate 18, height 5' (1.524 m),  weight 190 lb 2 oz (86.24 kg), SpO2 95.00%.  PHYSICAL EXAM:  General: Alert, oriented x3, no distress Head: no evidence of trauma, PERRL, EOMI, no exophtalmos or lid lag, no myxedema, no xanthelasma; normal ears, nose and oropharynx Neck: normal jugular venous pulsations and no hepatojugular reflux; brisk carotid pulses without delay and no carotid bruits Chest: clear to auscultation, no signs of consolidation by percussion or palpation, normal fremitus, symmetrical and full respiratory excursions Cardiovascular: normal position and quality of the apical impulse, regular rhythm, normal first heart sound and normal second heart sound, no rubs or gallops, no murmur Abdomen: no tenderness or distention, no masses by palpation, no abnormal pulsatility or arterial bruits, normal bowel sounds, no hepatosplenomegaly Extremities: no clubbing, cyanosis;  no edema; 2+ radial, ulnar and brachial pulses bilaterally; 2+ right femoral, posterior tibial and dorsalis pedis pulses; 2+ left femoral, posterior tibial and dorsalis pedis pulses; no subclavian or femoral bruits Neurological: grossly nonfocal   LABS  CBC  Recent Labs  09/15/13 1310  WBC 4.8  HGB 11.1*  HCT 34.2*  MCV 86.4  PLT 778*   Basic Metabolic Panel  Recent Labs  09/15/13 1310  NA 141  K 4.4  CL 103  CO2 24  GLUCOSE 154*  BUN 11  CREATININE 0.64  CALCIUM 8.6   IMAGING: No results found.  ECG: NSR, delayed anterior R wave progression, no ST-T changes  TELEMETRY: No arrhythmia  ECHO Left ventricle: The cavity size was normal. Wall thickness was increased in a pattern of mild LVH. Systolic function was normal. The estimated ejection fraction was in the range of 55% to 60%. Wall motion was normal; there were no regional wall motion abnormalities. Left ventricular diastolic function parameters were normal.   IMPRESSION: 1. Possible mild diastolic CHF - symptoms are worse at night when lying down and with activity,  substantial (10 lb) weight gain without change in diet/activity, edema at the end of the day 2. No signs of a new acute coronary syndrome  RECOMMENDATION: 1. Furosemide 20 mg daily and KCl supplement 2. If symptoms persist, after one week reduce metoprolol in 1/2 3. Keep appointment with Dr. Tamala Julian in 2 weeks   Time Spent Directly with Patient: 65 minutes  Sanda Klein, MD, [facility] [phone] office [phone] pager   09/15/2013, 4:24 PM

## 2013-10-01 ENCOUNTER — Encounter: Payer: Self-pay | Admitting: Interventional Cardiology

## 2013-10-01 ENCOUNTER — Telehealth: Payer: Self-pay

## 2013-10-01 ENCOUNTER — Ambulatory Visit (INDEPENDENT_AMBULATORY_CARE_PROVIDER_SITE_OTHER): Payer: No Typology Code available for payment source | Admitting: Interventional Cardiology

## 2013-10-01 VITALS — BP 120/66 | HR 60 | Ht 61.0 in | Wt 178.0 lb

## 2013-10-01 DIAGNOSIS — I10 Essential (primary) hypertension: Secondary | ICD-10-CM

## 2013-10-01 DIAGNOSIS — E785 Hyperlipidemia, unspecified: Secondary | ICD-10-CM

## 2013-10-01 DIAGNOSIS — R0609 Other forms of dyspnea: Secondary | ICD-10-CM

## 2013-10-01 DIAGNOSIS — R0600 Dyspnea, unspecified: Secondary | ICD-10-CM | POA: Insufficient documentation

## 2013-10-01 DIAGNOSIS — Z9861 Coronary angioplasty status: Secondary | ICD-10-CM

## 2013-10-01 DIAGNOSIS — R0989 Other specified symptoms and signs involving the circulatory and respiratory systems: Secondary | ICD-10-CM

## 2013-10-01 DIAGNOSIS — I2510 Atherosclerotic heart disease of native coronary artery without angina pectoris: Secondary | ICD-10-CM

## 2013-10-01 NOTE — Telephone Encounter (Signed)
Pt aware that Dr.Smith would like fasting lipid and alt.lab appt made for 10/04/13.pt aware of lab appt and verbalized understanding

## 2013-10-01 NOTE — Progress Notes (Signed)
Patient ID: AMEE [name], female   DOB: 13-Jul-1955, 58 y.o.   MRN: [id number]    [address], [address] [place], [place]  [address] Phone: [phone] Fax:  [phone]  Date:  10/01/2013   ID:  Karen [name] 01/01/1956, MRN [id number]  PCP:  [name], MD   ASSESSMENT:  1. Coronary disease, with RCA PCI and ramus DES, asymptomatic at this time. 2. Dyspnea has resolved after decreasing metoprolol 3. Hyperlipidemia 4. Hypertension, controlled 5. Diabetes with poor control  PLAN:  1. Active lifestyle 2. Follow up with Dr. Danise Mina for diabetes control 3. Followup in 6 months   SUBJECTIVE: Karen [name] is a 58 y.o. female who is doing well. Dyspnea has resolved. No angina. No medication side effects at this time.   Wt Readings from Last 3 Encounters:  10/01/13 178 lb (80.74 kg)  09/15/13 190 lb 2 oz (86.24 kg)  09/10/13 190 lb 8 oz (86.41 kg)     Past Medical History  Diagnosis Date  . History of chicken pox   . Depression   . Type 2 diabetes, uncontrolled, with neuropathy     dx 1993  . Chronic bronchitis   . Syncopal episodes   . GERD (gastroesophageal reflux disease)   . Seasonal allergies   . High blood pressure   . HLD (hyperlipidemia)   . History of ovarian cyst   . Tubal pregnancy   . Tachyarrhythmia     ER visits with adenosine push to control  . CAD (coronary artery disease)     STEMI 07/2013 - treated with PTCA of the RCA followed by DES to the intermediate ramus  . Seizure 1977    off AEDs    Current Outpatient Prescriptions  Medication Sig Dispense Refill  . acetaminophen (TYLENOL) 500 MG tablet Take 1,000 mg by mouth every 6 (six) hours as needed.      Marland Kitchen aspirin EC 81 MG EC tablet Take 1 tablet (81 mg total) by mouth daily.      . citalopram (CELEXA) 20 MG tablet Take 1 tablet (20 mg total) by mouth daily.  90 tablet  2  . clopidogrel (PLAVIX) 75 MG tablet Take 1 tablet (75 mg total) by mouth daily.  30 tablet  11  .  gabapentin (NEURONTIN) 100 MG capsule Take 1 capsule (100 mg total) by mouth at bedtime.  90 capsule  3  . HYDROcodone-homatropine (HYCODAN) 5-1.5 MG/5ML syrup Take 5 mLs by mouth at bedtime as needed for cough (sedation precautions).  120 mL  0  . insulin detemir (LEVEMIR) 100 UNIT/ML injection Inject 0.6 mLs (60 Units total) into the skin 2 (two) times daily  10 vial  3  . insulin regular (NOVOLIN R,HUMULIN R) 100 units/mL injection Inject 20 Units into the skin 3 (three) times daily before meals.      Marland Kitchen lisinopril (ZESTRIL) 2.5 MG tablet Take 1 tablet (2.5 mg total) by mouth daily.  90 tablet  2  . metFORMIN (GLUCOPHAGE) 1000 MG tablet Take 1 tablet (1,000 mg total) by mouth 2 (two) times daily with a meal. Do not restart Metformin until Saturday am 3/14  60 tablet  5  . metoprolol succinate (TOPROL-XL) 25 MG 24 hr tablet Take 12.5 mg by mouth 2 (two) times daily.      . nitroGLYCERIN (NITROSTAT) 0.4 MG SL tablet Place 1 tablet (0.4 mg total) under the tongue every 5 (five) minutes as needed for chest pain.  25  tablet  2  . pantoprazole (PROTONIX) 40 MG tablet Take 1 tablet (40 mg total) by mouth daily.  30 tablet  3  . Simethicone (GAS-X PO) Take 2 tablets by mouth 4 (four) times daily as needed (gas).      . simvastatin (ZOCOR) 40 MG tablet Take 1 tablet (40 mg total) by mouth daily.  90 tablet  2  . furosemide (LASIX) 40 MG tablet Take 20 mg by mouth as needed.       No current facility-administered medications for this visit.    Allergies:    Allergies  Allergen Reactions  . Erythromycin Other (See Comments)    Stomach cramps  . Lipitor [Atorvastatin] Nausea Only  . Penicillins Rash    Social History:  The patient  reports that she has never smoked. She has never used smokeless tobacco. She reports that she does not drink alcohol or use illicit drugs.   ROS:  Please see the history of present illness.   No claudication. Denies syncope.   All other systems reviewed and negative.    OBJECTIVE: VS:  BP 120/66  Pulse 60  Ht 5\' 1"  (1.549 m)  Wt 178 lb (80.74 kg)  BMI 33.65 kg/m2 Well nourished, well developed, in no acute distress, obese HEENT: normal Neck: JVD flat. Carotid bruit absent  Cardiac:  normal S1, S2; RRR; no murmur Lungs:  clear to auscultation bilaterally, no wheezing, rhonchi or rales Abd: soft, nontender, no hepatomegaly Ext: Edema absent. Pulses 2+ and symmetric Skin: warm and dry Neuro:  CNs 2-12 intact, no focal abnormalities noted  EKG:  Not repeated       Signed, Illene Labrador III, MD 10/01/2013 9:10 AM

## 2013-10-01 NOTE — Patient Instructions (Signed)
Your physician recommends that you continue on your current medications as directed. Please refer to the Current Medication list given to you today.  Your physician discussed the importance of regular exercise and recommended that you start or continue a regular exercise program for good health.  Your physician wants you to follow-up in: 6 months You will receive a reminder letter in the mail two months in advance. If you don't receive a letter, please call our office to schedule the follow-up appointment.

## 2013-10-04 ENCOUNTER — Other Ambulatory Visit: Payer: No Typology Code available for payment source

## 2013-10-31 ENCOUNTER — Other Ambulatory Visit: Payer: Self-pay | Admitting: Family Medicine

## 2013-11-22 ENCOUNTER — Telehealth: Payer: Self-pay | Admitting: *Deleted

## 2013-11-22 NOTE — Telephone Encounter (Signed)
**  DIABETIC BUNDLE**  Left voicemail requesting pt to schedule a lab appt to recheck lipid panel and A1c

## 2013-12-07 ENCOUNTER — Emergency Department (INDEPENDENT_AMBULATORY_CARE_PROVIDER_SITE_OTHER)
Admission: EM | Admit: 2013-12-07 | Discharge: 2013-12-07 | Disposition: A | Discharge status: Home / Self Care | Payer: No Typology Code available for payment source | Source: Home / Self Care | Attending: Family Medicine | Admitting: Family Medicine

## 2013-12-07 ENCOUNTER — Encounter (HOSPITAL_COMMUNITY): Payer: Self-pay | Admitting: Family Medicine

## 2013-12-07 DIAGNOSIS — A088 Other specified intestinal infections: Secondary | ICD-10-CM

## 2013-12-07 DIAGNOSIS — H6091 Unspecified otitis externa, right ear: Secondary | ICD-10-CM

## 2013-12-07 DIAGNOSIS — H60399 Other infective otitis externa, unspecified ear: Secondary | ICD-10-CM

## 2013-12-07 DIAGNOSIS — A084 Viral intestinal infection, unspecified: Secondary | ICD-10-CM

## 2013-12-07 MED ORDER — PROMETHAZINE HCL 25 MG PO TABS: 25.0000 mg | ORAL_TABLET | Freq: Four times a day (QID) | ORAL | Status: DC | PRN

## 2013-12-07 MED ORDER — ANTIPYRINE-BENZOCAINE 5.4-1.4 % OT SOLN
3.0000 [drp] | Freq: Once | OTIC | Status: AC
  Administered 2013-12-07: 3 [drp] via OTIC

## 2013-12-07 NOTE — ED Notes (Signed)
C/o right ear pain for a week now Denies discharge    States she has nausea, vomiting, and diarrhea zofran was taking this morning as tx

## 2013-12-07 NOTE — ED Provider Notes (Signed)
CSN: [id number]     Arrival date & time 12/07/13  1017 History   First MD Initiated Contact with Patient 12/07/13 1029     Chief Complaint  Patient presents with  . Nausea  . Otalgia   (Consider location/radiation/quality/duration/timing/severity/associated sxs/prior Treatment) HPI  Symptoms started today upon awaking. Felt nauseaus. Friend gave her some zofran w/ improvement. Associated w/ diarrhea, w/ improvement w/ imodium. Temp to 99.1 this am. Tylenol w/ benefit. Also w/ mild HA. House mate w/ near identical symptoms over the past few days. Denies CP, SOB, vaginal discharge, abd pain, dysuria, frequency.    Past Medical History  Diagnosis Date  . History of chicken pox   . Depression   . Type 2 diabetes, uncontrolled, with neuropathy     dx 1993  . Chronic bronchitis   . Syncopal episodes   . GERD (gastroesophageal reflux disease)   . Seasonal allergies   . High blood pressure   . HLD (hyperlipidemia)   . History of ovarian cyst   . Tubal pregnancy   . Tachyarrhythmia     ER visits with adenosine push to control  . CAD (coronary artery disease)     STEMI 07/2013 - treated with PTCA of the RCA followed by DES to the intermediate ramus  . Seizure 1977    off AEDs   Past Surgical History  Procedure Laterality Date  . Tonsillectomy and adenoidectomy  1965  . Ovarian cyst surgery  1998  . Dilation and curettage of uterus  1984  . Ectopic pregnancy surgery Right 1984    tube removal 2/2 hemorrhage  . Rotator cuff repair  2006    Right  . Cardiac catheterization  07/2013    angioplasty to RCA and DES to ramus intermedius   Family History  Problem Relation Age of Onset  . Diabetes Mother   . Hyperlipidemia Mother   . Hypertension Mother   . Stroke Mother   . Coronary artery disease Mother     CABG  . Heart disease Mother   . Diabetes Father   . Hypertension Father   . Hyperlipidemia Father   . Coronary artery disease Father     CABG  . Heart disease Father    CHF  . Pancreatic cancer Father   . Heart disease Brother     arrhythmia  . Diabetes Maternal Grandmother   . Diabetes Maternal Grandfather   . Diabetes Paternal Grandmother   . Diabetes Paternal Grandfather   . Kidney disease Neg Hx    History  Substance Use Topics  . Smoking status: Never Smoker   . Smokeless tobacco: Never Used  . Alcohol Use: No   OB History   Grav Para Term Preterm Abortions TAB SAB Ect Mult Living                 Review of Systems Per HPI with all other pertinent systems negative.   Allergies  Erythromycin; Lipitor; and Penicillins  Home Medications   Prior to Admission medications   Medication Sig Start Date End Date Taking? Authorizing Provider  acetaminophen (TYLENOL) 500 MG tablet Take 1,000 mg by mouth every 6 (six) hours as needed.    Historical Provider, MD  aspirin EC 81 MG EC tablet Take 1 tablet (81 mg total) by mouth daily. 07/25/13   Sueanne Margarita, MD  citalopram (CELEXA) 20 MG tablet Take 1 tablet (20 mg total) by mouth daily. 09/10/13   [name], MD  clopidogrel (PLAVIX) 75 MG  tablet Take 1 tablet (75 mg total) by mouth daily. 08/09/13   Burtis Junes, NP  gabapentin (NEURONTIN) 100 MG capsule Take 1 capsule (100 mg total) by mouth at bedtime. 09/11/13   [name], MD  HYDROcodone-homatropine [facility]) 5-1.5 MG/5ML syrup Take 5 mLs by mouth at bedtime as needed for cough (sedation precautions). 09/10/13   [name], MD  insulin detemir (LEVEMIR) 100 UNIT/ML injection Inject 0.6 mLs (60 Units total) into the skin 2 (two) times daily 09/03/13   Tonia Ghent, MD  insulin regular (NOVOLIN R,HUMULIN R) 100 units/mL injection Inject 20 Units into the skin 3 (three) times daily before meals.    Historical Provider, MD  lisinopril (ZESTRIL) 2.5 MG tablet Take 1 tablet (2.5 mg total) by mouth daily. 09/10/13   [name], MD  metFORMIN (GLUCOPHAGE) 1000 MG tablet TAKE 1 TABLET BY MOUTH TWICE DAILY WITH MEALS    [name], MD  metoprolol succinate (TOPROL-XL) 25 MG 24 hr tablet Take 12.5 mg by mouth 2 (two) times daily.    Historical Provider, MD  nitroGLYCERIN (NITROSTAT) 0.4 MG SL tablet Place 1 tablet (0.4 mg total) under the tongue every 5 (five) minutes as needed for chest pain. 07/25/13   Brittainy Simmons, PA-C  pantoprazole (PROTONIX) 40 MG tablet Take 1 tablet (40 mg total) by mouth daily. 09/10/13   [name], MD  promethazine (PHENERGAN) 25 MG tablet Take 1 tablet (25 mg total) by mouth every 6 (six) hours as needed for nausea or vomiting. 12/07/13   Waldemar Dickens, MD  Simethicone (GAS-X PO) Take 2 tablets by mouth 4 (four) times daily as needed (gas).    Historical Provider, MD  simvastatin (ZOCOR) 40 MG tablet Take 1 tablet (40 mg total) by mouth daily. 09/10/13   [name], MD   BP 129/73  Pulse 68  Temp(Src) 97.9 F (36.6 C) (Oral)  Wt 162 lb (73.483 kg)  SpO2 97% Physical Exam  Constitutional: She is oriented to person, place, and time. She appears well-developed and well-nourished. No distress.  HENT:  R TM mildly injected w/o fluid, retraction, or bulging. Ear canal nml, LTM nml  Eyes: EOM are normal. Pupils are equal, round, and reactive to light.  Neck: Normal range of motion.  Cardiovascular: Normal rate, normal heart sounds and intact distal pulses.  Exam reveals no gallop.   No murmur heard. Pulmonary/Chest: Effort normal and breath sounds normal.  Abdominal: Soft. She exhibits no distension and no mass. There is no tenderness. There is no rebound and no guarding.  Hypoactive BS  Musculoskeletal: Normal range of motion. She exhibits no tenderness.  Neurological: She is alert and oriented to person, place, and time. She exhibits normal muscle tone.  Skin: Skin is warm and dry. No rash noted. She is not diaphoretic.  Psychiatric: She has a normal mood and affect. Her behavior is normal. Judgment and thought content normal.    ED Course  Procedures (including  critical care time) Labs Review Labs Reviewed - No data to display  Imaging Review No results found.   MDM   1. Viral gastroenteritis   2. Otitis externa, right    Vral gastro. Start phenergan for nausea, Fluids and rest discussed. Imodium is OK unless becomes febrile w/ worsening of diarrhea or if becomes bloody. Pt to come in if worsens  Auralgan for otitis externa. Non-infectious.  Precautions given and all questions answered   Linna Darner, MD Family Medicine 12/07/2013, 10:53 AM  Waldemar Dickens, MD 12/07/13 [phone]

## 2013-12-07 NOTE — Discharge Instructions (Signed)
You likely have a viral gut infection called viral gastroenteritis This should clear in another 1-14 days Please use the phenergan for nausea Please stopthe imodium if you develop fevers, severe abdominal pain, or bloody diarrhea Please use the auralgan for ear pain. There is no sign of infection, just a little irritation Please come back if needed  Viral Gastroenteritis Viral gastroenteritis is also known as stomach flu. This condition affects the stomach and intestinal tract. It can cause sudden diarrhea and vomiting. The illness typically lasts 3 to 8 days. Most people develop an immune response that eventually gets rid of the virus. While this natural response develops, the virus can make you quite ill. CAUSES  Many different viruses can cause gastroenteritis, such as rotavirus or noroviruses. You can catch one of these viruses by consuming contaminated food or water. You may also catch a virus by sharing utensils or other personal items with an infected person or by touching a contaminated surface. SYMPTOMS  The most common symptoms are diarrhea and vomiting. These problems can cause a severe loss of body fluids (dehydration) and a body salt (electrolyte) imbalance. Other symptoms may include:  Fever.  Headache.  Fatigue.  Abdominal pain. DIAGNOSIS  Your caregiver can usually diagnose viral gastroenteritis based on your symptoms and a physical exam. A stool sample may also be taken to test for the presence of viruses or other infections. TREATMENT  This illness typically goes away on its own. Treatments are aimed at rehydration. The most serious cases of viral gastroenteritis involve vomiting so severely that you are not able to keep fluids down. In these cases, fluids must be given through an intravenous line (IV). HOME CARE INSTRUCTIONS   Drink enough fluids to keep your urine clear or pale yellow. Drink small amounts of fluids frequently and increase the amounts as  tolerated.  Ask your caregiver for specific rehydration instructions.  Avoid:  Foods high in sugar.  Alcohol.  Carbonated drinks.  Tobacco.  Juice.  Caffeine drinks.  Extremely hot or cold fluids.  Fatty, greasy foods.  Too much intake of anything at one time.  Dairy products until 24 to 48 hours after diarrhea stops.  You may consume probiotics. Probiotics are active cultures of beneficial bacteria. They may lessen the amount and number of diarrheal stools in adults. Probiotics can be found in yogurt with active cultures and in supplements.  Wash your hands well to avoid spreading the virus.  Only take over-the-counter or prescription medicines for pain, discomfort, or fever as directed by your caregiver. Do not give aspirin to children. Antidiarrheal medicines are not recommended.  Ask your caregiver if you should continue to take your regular prescribed and over-the-counter medicines.  Keep all follow-up appointments as directed by your caregiver. SEEK IMMEDIATE MEDICAL CARE IF:   You are unable to keep fluids down.  You do not urinate at least once every 6 to 8 hours.  You develop shortness of breath.  You notice blood in your stool or vomit. This may look like coffee grounds.  You have abdominal pain that increases or is concentrated in one small area (localized).  You have persistent vomiting or diarrhea.  You have a fever.  The patient is a child younger than 3 months, and he or she has a fever.  The patient is a child older than 3 months, and he or she has a fever and persistent symptoms.  The patient is a child older than 3 months, and he or she has  a fever and symptoms suddenly get worse.  The patient is a baby, and he or she has no tears when crying. MAKE SURE YOU:   Understand these instructions.  Will watch your condition.  Will get help right away if you are not doing well or get worse. Document Released: 05/02/2005 Document Revised:  07/25/2011 Document Reviewed: 02/16/2011 [facility] Patient Information 2015 [place], [place]. This information is not intended to replace advice given to you by your health care provider. Make sure you discuss any questions you have with your health care provider.

## 2013-12-14 ENCOUNTER — Encounter (HOSPITAL_COMMUNITY): Payer: Self-pay | Admitting: Emergency Medicine

## 2013-12-14 ENCOUNTER — Emergency Department (INDEPENDENT_AMBULATORY_CARE_PROVIDER_SITE_OTHER)
Admission: EM | Admit: 2013-12-14 | Discharge: 2013-12-14 | Disposition: A | Discharge status: Home / Self Care | Payer: No Typology Code available for payment source | Source: Home / Self Care | Attending: Emergency Medicine | Admitting: Emergency Medicine

## 2013-12-14 DIAGNOSIS — H9201 Otalgia, right ear: Secondary | ICD-10-CM

## 2013-12-14 DIAGNOSIS — H9209 Otalgia, unspecified ear: Secondary | ICD-10-CM

## 2013-12-14 MED ORDER — ANTIPYRINE-BENZOCAINE 5.4-1.4 % OT SOLN: 3.0000 [drp] | OTIC | Status: DC | PRN

## 2013-12-14 NOTE — ED Provider Notes (Signed)
Medical screening examination/treatment/procedure(s) were performed by resident physician or non-physician practitioner and as supervising physician I was immediately available for consultation/collaboration.  Maryruth Eve, MD     Melony Overly, MD 12/14/13 [phone]

## 2013-12-14 NOTE — Discharge Instructions (Signed)
Please contact  Ear, Nose and Throat for further evaluation. Otalgia The most common reason for this in children is an infection of the middle ear. Pain from the middle ear is usually caused by a build-up of fluid and pressure behind the eardrum. Pain from an earache can be sharp, dull, or burning. The pain may be temporary or constant. The middle ear is connected to the nasal passages by a short narrow tube called the Eustachian tube. The Eustachian tube allows fluid to drain out of the middle ear, and helps keep the pressure in your ear equalized. CAUSES  A cold or allergy can block the Eustachian tube with inflammation and the build-up of secretions. This is especially likely in small children, because their Eustachian tube is shorter and more horizontal. When the Eustachian tube closes, the normal flow of fluid from the middle ear is stopped. Fluid can accumulate and cause stuffiness, pain, hearing loss, and an ear infection if germs start growing in this area. SYMPTOMS  The symptoms of an ear infection may include fever, ear pain, fussiness, increased crying, and irritability. Many children will have temporary and minor hearing loss during and right after an ear infection. Permanent hearing loss is rare, but the risk increases the more infections a child has. Other causes of ear pain include retained water in the outer ear canal from swimming and bathing. Ear pain in adults is less likely to be from an ear infection. Ear pain may be referred from other locations. Referred pain may be from the joint between your jaw and the skull. It may also come from a tooth problem or problems in the neck. Other causes of ear pain include:  A foreign body in the ear.  Outer ear infection.  Sinus infections.  Impacted ear wax.  Ear injury.  Arthritis of the jaw or TMJ problems.  Middle ear infection.  Tooth infections.  Sore throat with pain to the ears. DIAGNOSIS  Your caregiver can  usually make the diagnosis by examining you. Sometimes other special studies, including x-rays and lab work may be necessary. TREATMENT   If antibiotics were prescribed, use them as directed and finish them even if you or your child's symptoms seem to be improved.  Sometimes PE tubes are needed in children. These are little plastic tubes which are put into the eardrum during a simple surgical procedure. They allow fluid to drain easier and allow the pressure in the middle ear to equalize. This helps relieve the ear pain caused by pressure changes. HOME CARE INSTRUCTIONS   Only take over-the-counter or prescription medicines for pain, discomfort, or fever as directed by your caregiver. DO NOT GIVE CHILDREN ASPIRIN because of the association of Reye's Syndrome in children taking aspirin.  Use a cold pack applied to the outer ear for 15-20 minutes, 03-04 times per day or as needed may reduce pain. Do not apply ice directly to the skin. You may cause frost bite.  Over-the-counter ear drops used as directed may be effective. Your caregiver may sometimes prescribe ear drops.  Resting in an upright position may help reduce pressure in the middle ear and relieve pain.  Ear pain caused by rapidly descending from high altitudes can be relieved by swallowing or chewing gum. Allowing infants to suck on a bottle during airplane travel can help.  Do not smoke in the house or near children. If you are unable to quit smoking, smoke outside.  Control allergies. SEEK IMMEDIATE MEDICAL CARE IF:   You  or your child are becoming sicker.  Pain or fever relief is not obtained with medicine.  You or your child's symptoms (pain, fever, or irritability) do not improve within 24 to 48 hours or as instructed.  Severe pain suddenly stops hurting. This may indicate a ruptured eardrum.  You or your children develop new problems such as severe headaches, stiff neck, difficulty swallowing, or swelling of the face or  around the ear. Document Released: 12/18/2003 Document Revised: 07/25/2011 Document Reviewed: 04/23/2008 [facility] Patient Information 2015 [place], [place]. This information is not intended to replace advice given to you by your health care provider. Make sure you discuss any questions you have with your health care provider.

## 2013-12-14 NOTE — ED Notes (Signed)
Was seen 7/25 and dx right otitis externa.  Was given Auralgan gtts to use at home; states pain went away, but now is "back with a vengeance".

## 2013-12-14 NOTE — ED Provider Notes (Signed)
CSN: [id number]     Arrival date & time 12/14/13  1607 History   First MD Initiated Contact with Patient 12/14/13 1712     Chief Complaint  Patient presents with  . Otalgia   (Consider location/radiation/quality/duration/timing/severity/associated sxs/prior Treatment) Patient is a 58 y.o. female presenting with ear pain.  Otalgia Location:  Right Behind ear:  No abnormality Quality:  Aching and pressure Severity:  Moderate Onset quality:  Gradual Duration:  2 weeks Timing:  Constant Progression:  Unchanged Chronicity:  New Relieved by: use of auralgan. Associated symptoms comment:  None   Past Medical History  Diagnosis Date  . History of chicken pox   . Depression   . Type 2 diabetes, uncontrolled, with neuropathy     dx 1993  . Chronic bronchitis   . Syncopal episodes   . GERD (gastroesophageal reflux disease)   . Seasonal allergies   . High blood pressure   . HLD (hyperlipidemia)   . History of ovarian cyst   . Tubal pregnancy   . Tachyarrhythmia     ER visits with adenosine push to control  . CAD (coronary artery disease)     STEMI 07/2013 - treated with PTCA of the RCA followed by DES to the intermediate ramus  . Seizure 1977    off AEDs   Past Surgical History  Procedure Laterality Date  . Tonsillectomy and adenoidectomy  1965  . Ovarian cyst surgery  1998  . Dilation and curettage of uterus  1984  . Ectopic pregnancy surgery Right 1984    tube removal 2/2 hemorrhage  . Rotator cuff repair  2006    Right  . Cardiac catheterization  07/2013    angioplasty to RCA and DES to ramus intermedius   Family History  Problem Relation Age of Onset  . Diabetes Mother   . Hyperlipidemia Mother   . Hypertension Mother   . Stroke Mother   . Coronary artery disease Mother     CABG  . Heart disease Mother   . Diabetes Father   . Hypertension Father   . Hyperlipidemia Father   . Coronary artery disease Father     CABG  . Heart disease Father     CHF  .  Pancreatic cancer Father   . Heart disease Brother     arrhythmia  . Diabetes Maternal Grandmother   . Diabetes Maternal Grandfather   . Diabetes Paternal Grandmother   . Diabetes Paternal Grandfather   . Kidney disease Neg Hx    History  Substance Use Topics  . Smoking status: Never Smoker   . Smokeless tobacco: Never Used  . Alcohol Use: No   OB History   Grav Para Term Preterm Abortions TAB SAB Ect Mult Living                 Review of Systems  HENT: Positive for ear pain.   All other systems reviewed and are negative.   Allergies  Erythromycin; Lipitor; and Penicillins  Home Medications   Prior to Admission medications   Medication Sig Start Date End Date Taking? Authorizing Provider  aspirin EC 81 MG EC tablet Take 1 tablet (81 mg total) by mouth daily. 07/25/13  Yes Sueanne Margarita, MD  citalopram (CELEXA) 20 MG tablet Take 1 tablet (20 mg total) by mouth daily. 09/10/13  Yes [name], MD  clopidogrel (PLAVIX) 75 MG tablet Take 1 tablet (75 mg total) by mouth daily. 08/09/13  Yes Burtis Junes, NP  gabapentin (NEURONTIN) 100 MG capsule Take 1 capsule (100 mg total) by mouth at bedtime. 09/11/13  Yes [name], MD  insulin aspart (NOVOLOG) 100 UNIT/ML injection Inject 20 Units into the skin 3 (three) times daily with meals.   Yes Historical Provider, MD  insulin detemir (LEVEMIR) 100 UNIT/ML injection Inject 0.6 mLs (60 Units total) into the skin 2 (two) times daily 09/03/13  Yes Tonia Ghent, MD  lisinopril (ZESTRIL) 2.5 MG tablet Take 1 tablet (2.5 mg total) by mouth daily. 09/10/13  Yes [name], MD  metFORMIN (GLUCOPHAGE) 1000 MG tablet TAKE 1 TABLET BY MOUTH TWICE DAILY WITH MEALS   Yes [name], MD  metoprolol succinate (TOPROL-XL) 25 MG 24 hr tablet Take 12.5 mg by mouth 2 (two) times daily.   Yes Historical Provider, MD  nitroGLYCERIN (NITROSTAT) 0.4 MG SL tablet Place 1 tablet (0.4 mg total) under the tongue every 5 (five) minutes as  needed for chest pain. 07/25/13  Yes Brittainy Simmons, PA-C  pantoprazole (PROTONIX) 40 MG tablet Take 1 tablet (40 mg total) by mouth daily. 09/10/13  Yes [name], MD  promethazine (PHENERGAN) 25 MG tablet Take 1 tablet (25 mg total) by mouth every 6 (six) hours as needed for nausea or vomiting. 12/07/13  Yes Waldemar Dickens, MD  Simethicone (GAS-X PO) Take 2 tablets by mouth 4 (four) times daily as needed (gas).   Yes Historical Provider, MD  simvastatin (ZOCOR) 40 MG tablet Take 1 tablet (40 mg total) by mouth daily. 09/10/13  Yes [name], MD  acetaminophen (TYLENOL) 500 MG tablet Take 1,000 mg by mouth every 6 (six) hours as needed.    Historical Provider, MD  antipyrine-benzocaine Toniann Fail) otic solution Place 3-4 drops into the right ear every 2 (two) hours as needed for ear pain. 12/14/13   Lahoma Rocker, PA  HYDROcodone-homatropine [facility]) 5-1.5 MG/5ML syrup Take 5 mLs by mouth at bedtime as needed for cough (sedation precautions). 09/10/13   [name], MD  insulin regular (NOVOLIN R,HUMULIN R) 100 units/mL injection Inject 20 Units into the skin 3 (three) times daily before meals.    Historical Provider, MD   BP 122/86  Pulse 80  Temp(Src) 98.3 F (36.8 C) (Oral)  SpO2 96% Physical Exam  Nursing note and vitals reviewed. Constitutional: She is oriented to person, place, and time. She appears well-developed and well-nourished.  HENT:  Head: Normocephalic and atraumatic.  Right Ear: Hearing, external ear and ear canal normal. No drainage or swelling. No foreign bodies. No mastoid tenderness. Tympanic membrane is not injected, not scarred, not perforated, not erythematous, not retracted and not bulging. A middle ear effusion is present. No hemotympanum. No decreased hearing is noted.  Left Ear: Hearing, tympanic membrane, external ear and ear canal normal.  Nose: Nose normal.  Mouth/Throat: Uvula is midline, oropharynx is clear and moist and mucous membranes  are normal. No oral lesions. No trismus in the jaw. Dental caries present. No dental abscesses, uvula swelling or lacerations.  Generally poor dentition with only a few remaining teeth and those are in poor repair No indication of dental abscess causing referred pain to right ear. No facial swelling or pain  +moderate clear fluid behind right TM  Eyes: Conjunctivae are normal. No scleral icterus.  Neck: Normal range of motion. Neck supple.  Cardiovascular: Normal rate.   Pulmonary/Chest: Effort normal.  Lymphadenopathy:    She has no cervical adenopathy.  Neurological: She is alert and oriented to person, place, and time.  Skin: Skin is warm and dry.  Psychiatric: She has a normal mood and affect. Her behavior is normal.    ED Course  Procedures (including critical care time) Labs Review Labs Reviewed - No data to display  Imaging Review No results found.   MDM   1. Otalgia of right ear    Recommended she continue to use tylenol as indicated on packaging for pain and auralgan as previously prescribed and that she arrange follow up appointment with [facility].    [place], [place] 12/14/13 [phone]

## 2013-12-16 ENCOUNTER — Telehealth: Payer: Self-pay | Admitting: Family Medicine

## 2013-12-16 MED ORDER — INSULIN NPH ISOPHANE & REGULAR (70-30) 100 UNIT/ML ~~LOC~~ SUSP: SUBCUTANEOUS | Status: DC

## 2013-12-16 NOTE — Telephone Encounter (Signed)
Please see message and advise 

## 2013-12-16 NOTE — Telephone Encounter (Signed)
Change to 70/30.  2 shots a day for now.  Stop all the other insulins for now.  rx sent. Have her update re: sugars in a few days.  She is going to run higher than normal in the meantime.  We'll have to tweak her dose.  Thanks.

## 2013-12-16 NOTE — Telephone Encounter (Signed)
Patient notified and will call back with sugars in a few days.

## 2013-12-16 NOTE — Telephone Encounter (Signed)
Pt would like for you to call her back in ref to her insulin. Pt has to re-do her policy w/[employer]. She is out of Levamir but can afford Novlin 70/30 until her insurance is corrected (this is temporary until she gets her insurance straightened out). She wants to know if she should continue 60 units w/the Novlin and also should she continue w/the 20 units w/meals. Please advise. Thank you.

## 2013-12-23 NOTE — Telephone Encounter (Signed)
Pt left v/m requesting cb; left v/m requesting pt to cb.

## 2013-12-28 LAB — HM DIABETES EYE EXAM

## 2013-12-30 ENCOUNTER — Telehealth: Payer: Self-pay

## 2013-12-30 MED ORDER — INSULIN GLARGINE 300 UNIT/ML ~~LOC~~ SOPN
60.0000 [IU] | PEN_INJECTOR | Freq: Two times a day (BID) | SUBCUTANEOUS | Status: DC
Start: 2013-12-30 — End: 2014-01-02

## 2013-12-30 NOTE — Telephone Encounter (Signed)
Pt left v/m; pt wants to ck on new insulin; Toujeo; pt wants to know if Dr Darnell Level would recommend Toujeo; pt wants to know if we have any samples of new med; pt has prescription card and could purchase Toujeo for $15.00/month.pt request cb.

## 2013-12-30 NOTE — Telephone Encounter (Signed)
Error see 12/20/13 note.

## 2013-12-30 NOTE — Telephone Encounter (Addendum)
toujeo sent to [facility] to take 60u bid. 3 month supply. May need to restart novalog or Novalin R with meals. Again monitor sugars closely with change in dose and call us next Monday with update on sugars. No samples available.

## 2013-12-30 NOTE — Telephone Encounter (Signed)
Spoke with pt 12/26/13 at 7pm BS 250; 12/27/13 at 6AM FBS 129; 12/28/13 at 7 pm BS 200 and 12/29/13 at 8 pm BS 200. Has not taken BS today. Pt states no symptoms related to BS.  Occasionally if sugar goes below 135 feels shakey. Pt did get glasses recently and is going to schedule opthomologist appt for glaucoma and diabetic retinopathy.

## 2013-12-30 NOTE — Addendum Note (Signed)
Addended by: [name] on: 12/30/2013 05:44 PM   Modules accepted: Orders, Medications

## 2013-12-31 ENCOUNTER — Telehealth: Payer: Self-pay | Admitting: *Deleted

## 2013-12-31 ENCOUNTER — Telehealth: Payer: Self-pay | Admitting: Family Medicine

## 2013-12-31 DIAGNOSIS — E113513 Type 2 diabetes mellitus with proliferative diabetic retinopathy with macular edema, bilateral: Secondary | ICD-10-CM | POA: Insufficient documentation

## 2013-12-31 DIAGNOSIS — E113399 Type 2 diabetes mellitus with moderate nonproliferative diabetic retinopathy without macular edema, unspecified eye: Secondary | ICD-10-CM

## 2013-12-31 DIAGNOSIS — E11319 Type 2 diabetes mellitus with unspecified diabetic retinopathy without macular edema: Secondary | ICD-10-CM | POA: Insufficient documentation

## 2013-12-31 NOTE — Telephone Encounter (Signed)
Message leftt advising patient. Advised to call back with an update in 1 week.

## 2013-12-31 NOTE — Telephone Encounter (Signed)
Please call pt back in regards to her insulin.  [phone]

## 2013-12-31 NOTE — Telephone Encounter (Signed)
Can't afford toujeo after all. She read the card wrong. She can't afford the levemir either ($600.00) because she hasn't met her deductible. Any other suggestions?

## 2013-12-31 NOTE — Telephone Encounter (Signed)
Message left for patient. See other phone note.

## 2014-01-01 ENCOUNTER — Telehealth: Payer: Self-pay | Admitting: Family Medicine

## 2014-01-01 NOTE — Telephone Encounter (Signed)
Patient is returning your call about her Levemir Insulin.

## 2014-01-01 NOTE — Telephone Encounter (Signed)
plz have her check what is cheapest insulin per her formulary (I anticipate novolog 70/30)? Then would have her check on patient assistance program if cannot afford this.

## 2014-01-01 NOTE — Telephone Encounter (Signed)
Patient notified and will check on things and call me back.

## 2014-01-02 ENCOUNTER — Telehealth: Payer: Self-pay | Admitting: Family Medicine

## 2014-01-02 MED ORDER — INSULIN DETEMIR 100 UNIT/ML ~~LOC~~ SOLN: 60.0000 [IU] | Freq: Two times a day (BID) | SUBCUTANEOUS | Status: DC

## 2014-01-02 NOTE — Telephone Encounter (Signed)
Attempted to call patient. No answer. Left detailed message about levemir being sent to pharmacy.

## 2014-01-02 NOTE — Telephone Encounter (Signed)
Patient returned your call.

## 2014-01-02 NOTE — Telephone Encounter (Addendum)
Pt left v/m; pt thinks has ins straightened out; pt understands can get one vial of levemir at [place] at no copay. Pt request levemir 1 vial to [facility] this afternoon. Pt does not have any insulin; pt request cb ASAP. I was unable to reach pt.Please advise.

## 2014-01-02 NOTE — Addendum Note (Signed)
Addended by: Royann Shivers A on: 01/02/2014 03:12 PM   Modules accepted: Orders

## 2014-01-02 NOTE — Telephone Encounter (Signed)
Message left. See other phone note.

## 2014-01-02 NOTE — Telephone Encounter (Signed)
Rx sent to pharmacy with previous SIG. Message left notifying patient. Also notified her to continue looking for insulin coverage after this vial runs out so that appropriate actions can be taken before she runs out.

## 2014-01-09 ENCOUNTER — Other Ambulatory Visit: Payer: Self-pay | Admitting: *Deleted

## 2014-01-09 MED ORDER — INSULIN DETEMIR 100 UNIT/ML ~~LOC~~ SOLN: 60.0000 [IU] | Freq: Two times a day (BID) | SUBCUTANEOUS | Status: DC

## 2014-01-26 ENCOUNTER — Emergency Department (HOSPITAL_COMMUNITY): Payer: No Typology Code available for payment source

## 2014-01-26 ENCOUNTER — Emergency Department (HOSPITAL_COMMUNITY)
Admission: EM | Admit: 2014-01-26 | Discharge: 2014-01-27 | Disposition: A | Discharge status: Home / Self Care | Payer: No Typology Code available for payment source | Attending: Emergency Medicine | Admitting: Emergency Medicine

## 2014-01-26 ENCOUNTER — Encounter (HOSPITAL_COMMUNITY): Payer: Self-pay | Admitting: Emergency Medicine

## 2014-01-26 DIAGNOSIS — I2510 Atherosclerotic heart disease of native coronary artery without angina pectoris: Secondary | ICD-10-CM | POA: Insufficient documentation

## 2014-01-26 DIAGNOSIS — Z8619 Personal history of other infectious and parasitic diseases: Secondary | ICD-10-CM | POA: Insufficient documentation

## 2014-01-26 DIAGNOSIS — E785 Hyperlipidemia, unspecified: Secondary | ICD-10-CM | POA: Insufficient documentation

## 2014-01-26 DIAGNOSIS — Z8709 Personal history of other diseases of the respiratory system: Secondary | ICD-10-CM | POA: Insufficient documentation

## 2014-01-26 DIAGNOSIS — Z794 Long term (current) use of insulin: Secondary | ICD-10-CM | POA: Insufficient documentation

## 2014-01-26 DIAGNOSIS — Z9889 Other specified postprocedural states: Secondary | ICD-10-CM | POA: Insufficient documentation

## 2014-01-26 DIAGNOSIS — W19XXXA Unspecified fall, initial encounter: Secondary | ICD-10-CM

## 2014-01-26 DIAGNOSIS — Z79899 Other long term (current) drug therapy: Secondary | ICD-10-CM | POA: Insufficient documentation

## 2014-01-26 DIAGNOSIS — S59919A Unspecified injury of unspecified forearm, initial encounter: Principal | ICD-10-CM

## 2014-01-26 DIAGNOSIS — Y9389 Activity, other specified: Secondary | ICD-10-CM | POA: Insufficient documentation

## 2014-01-26 DIAGNOSIS — Z7982 Long term (current) use of aspirin: Secondary | ICD-10-CM | POA: Diagnosis not present

## 2014-01-26 DIAGNOSIS — S59909A Unspecified injury of unspecified elbow, initial encounter: Secondary | ICD-10-CM | POA: Insufficient documentation

## 2014-01-26 DIAGNOSIS — I10 Essential (primary) hypertension: Secondary | ICD-10-CM | POA: Insufficient documentation

## 2014-01-26 DIAGNOSIS — G40909 Epilepsy, unspecified, not intractable, without status epilepticus: Secondary | ICD-10-CM | POA: Insufficient documentation

## 2014-01-26 DIAGNOSIS — E119 Type 2 diabetes mellitus without complications: Secondary | ICD-10-CM | POA: Diagnosis not present

## 2014-01-26 DIAGNOSIS — K219 Gastro-esophageal reflux disease without esophagitis: Secondary | ICD-10-CM | POA: Insufficient documentation

## 2014-01-26 DIAGNOSIS — F329 Major depressive disorder, single episode, unspecified: Secondary | ICD-10-CM | POA: Diagnosis not present

## 2014-01-26 DIAGNOSIS — Y9289 Other specified places as the place of occurrence of the external cause: Secondary | ICD-10-CM | POA: Insufficient documentation

## 2014-01-26 DIAGNOSIS — Z8742 Personal history of other diseases of the female genital tract: Secondary | ICD-10-CM | POA: Insufficient documentation

## 2014-01-26 DIAGNOSIS — Z880 Allergy status to penicillin: Secondary | ICD-10-CM | POA: Insufficient documentation

## 2014-01-26 DIAGNOSIS — F3289 Other specified depressive episodes: Secondary | ICD-10-CM | POA: Diagnosis not present

## 2014-01-26 DIAGNOSIS — W010XXA Fall on same level from slipping, tripping and stumbling without subsequent striking against object, initial encounter: Secondary | ICD-10-CM | POA: Diagnosis not present

## 2014-01-26 DIAGNOSIS — IMO0002 Reserved for concepts with insufficient information to code with codable children: Secondary | ICD-10-CM | POA: Insufficient documentation

## 2014-01-26 DIAGNOSIS — Z7902 Long term (current) use of antithrombotics/antiplatelets: Secondary | ICD-10-CM | POA: Diagnosis not present

## 2014-01-26 DIAGNOSIS — M79642 Pain in left hand: Secondary | ICD-10-CM

## 2014-01-26 DIAGNOSIS — M79641 Pain in right hand: Secondary | ICD-10-CM

## 2014-01-26 DIAGNOSIS — S6990XA Unspecified injury of unspecified wrist, hand and finger(s), initial encounter: Secondary | ICD-10-CM | POA: Insufficient documentation

## 2014-01-26 NOTE — ED Provider Notes (Signed)
CSN: [id number]     Arrival date & time 01/26/14  2205 History   First MD Initiated Contact with Patient 01/26/14 2212     This chart was scribed for non-physician practitioner, Clayton Bibles PA-C working with Thayer Jew, MD by Forrestine Him, ED Scribe. This patient was seen in room TR06C/TR06C and the patient's care was started at 10:25 PM.   Chief Complaint  Patient presents with  . Fall  . Wrist Pain   The history is provided by the patient. No language interpreter was used.    HPI Comments: Karen [name] is a 58 y.o. female who presents to the Emergency Department complaining of a fall sustained this evening about 1 hour prior to arrival. Pt states she was doing her laundry when she missed a step up on the room, tripped and fell. Arms were outstretched to attempt to catch herself. Landed on her hands and knees. She now c/o moderate R wrist, R hand, and L hand pain that is unchanged, only hurts when she moves. Currently she rates pain 5/10. She has tried OTC Tylenol prior to arrival without any improvement for symptoms.  Did not hit her head or lose consciousness. Denies weakness or numbness of the hands.  She denies any headache, abdominal pain, neck pain, back pain, SOB, lower extremity pain, or chest pain. Karen [name] is currently on Plavix.  When asked about last tetanus vx, pt stated she was not going to get any shots today.  Pt is on Plavix.   Past Medical History  Diagnosis Date  . History of chicken pox   . Depression   . Type 2 diabetes, uncontrolled, with neuropathy     dx 1993  . Chronic bronchitis   . Syncopal episodes   . GERD (gastroesophageal reflux disease)   . Seasonal allergies   . High blood pressure   . HLD (hyperlipidemia)   . History of ovarian cyst   . Tubal pregnancy   . Tachyarrhythmia     ER visits with adenosine push to control  . CAD (coronary artery disease)     STEMI 07/2013 - treated with PTCA of the RCA followed by DES to the intermediate ramus  .  Seizure 1977    off AEDs   Past Surgical History  Procedure Laterality Date  . Tonsillectomy and adenoidectomy  1965  . Ovarian cyst surgery  1998  . Dilation and curettage of uterus  1984  . Ectopic pregnancy surgery Right 1984    tube removal 2/2 hemorrhage  . Rotator cuff repair  2006    Right  . Cardiac catheterization  07/2013    angioplasty to RCA and DES to ramus intermedius   Family History  Problem Relation Age of Onset  . Diabetes Mother   . Hyperlipidemia Mother   . Hypertension Mother   . Stroke Mother   . Coronary artery disease Mother     CABG  . Heart disease Mother   . Diabetes Father   . Hypertension Father   . Hyperlipidemia Father   . Coronary artery disease Father     CABG  . Heart disease Father     CHF  . Pancreatic cancer Father   . Heart disease Brother     arrhythmia  . Diabetes Maternal Grandmother   . Diabetes Maternal Grandfather   . Diabetes Paternal Grandmother   . Diabetes Paternal Grandfather   . Kidney disease Neg Hx    History  Substance Use Topics  .  Smoking status: Never Smoker   . Smokeless tobacco: Never Used  . Alcohol Use: No   OB History   Grav Para Term Preterm Abortions TAB SAB Ect Mult Living                 Review of Systems  Constitutional: Negative for fever and chills.  Respiratory: Negative for cough.   Cardiovascular: Negative for chest pain.  Musculoskeletal: Positive for arthralgias. Negative for back pain and neck pain.  All other systems reviewed and are negative.     Allergies  Erythromycin; Lipitor; and Penicillins  Home Medications   Prior to Admission medications   Medication Sig Start Date End Date Taking? Authorizing Provider  acetaminophen (TYLENOL) 500 MG tablet Take 1,000 mg by mouth every 6 (six) hours as needed.    Historical Provider, MD  antipyrine-benzocaine Toniann Fail) otic solution Place 3-4 drops into the right ear every 2 (two) hours as needed for ear pain. 12/14/13   Audelia Hives Presson, PA  aspirin EC 81 MG EC tablet Take 1 tablet (81 mg total) by mouth daily. 07/25/13   Sueanne Margarita, MD  citalopram (CELEXA) 20 MG tablet Take 1 tablet (20 mg total) by mouth daily. 09/10/13   [name], MD  clopidogrel (PLAVIX) 75 MG tablet Take 1 tablet (75 mg total) by mouth daily. 08/09/13   Burtis Junes, NP  gabapentin (NEURONTIN) 100 MG capsule Take 1 capsule (100 mg total) by mouth at bedtime. 09/11/13   [name], MD  HYDROcodone-homatropine [facility]) 5-1.5 MG/5ML syrup Take 5 mLs by mouth at bedtime as needed for cough (sedation precautions). 09/10/13   [name], MD  insulin detemir (LEVEMIR) 100 UNIT/ML injection Inject 0.6 mLs (60 Units total) into the skin 2 (two) times daily. 01/09/14   [name], MD  lisinopril (ZESTRIL) 2.5 MG tablet Take 1 tablet (2.5 mg total) by mouth daily. 09/10/13   [name], MD  metFORMIN (GLUCOPHAGE) 1000 MG tablet TAKE 1 TABLET BY MOUTH TWICE DAILY WITH MEALS    [name], MD  metoprolol succinate (TOPROL-XL) 25 MG 24 hr tablet Take 12.5 mg by mouth 2 (two) times daily.    Historical Provider, MD  nitroGLYCERIN (NITROSTAT) 0.4 MG SL tablet Place 1 tablet (0.4 mg total) under the tongue every 5 (five) minutes as needed for chest pain. 07/25/13   Brittainy Simmons, PA-C  pantoprazole (PROTONIX) 40 MG tablet Take 1 tablet (40 mg total) by mouth daily. 09/10/13   [name], MD  promethazine (PHENERGAN) 25 MG tablet Take 1 tablet (25 mg total) by mouth every 6 (six) hours as needed for nausea or vomiting. 12/07/13   Waldemar Dickens, MD  Simethicone (GAS-X PO) Take 2 tablets by mouth 4 (four) times daily as needed (gas).    Historical Provider, MD  simvastatin (ZOCOR) 40 MG tablet Take 1 tablet (40 mg total) by mouth daily. 09/10/13   [name], MD   Triage Vitals: BP 134/59  Pulse 81  Temp(Src) 98.1 F (36.7 C) (Oral)  Resp 18  SpO2 95%   Physical Exam  Nursing note and vitals  reviewed. Constitutional: She appears well-developed and well-nourished. No distress.  HENT:  Head: Normocephalic and atraumatic.  Neck: Neck supple.  Pulmonary/Chest: Effort normal.  Musculoskeletal:       Right wrist: Normal.       Left wrist: Normal.       Arms:      Right hand: Normal.  Left hand: She exhibits tenderness. She exhibits normal range of motion, normal two-point discrimination, normal capillary refill, no deformity, no laceration and no swelling. Normal sensation noted. Normal strength noted.       Hands: Neurological: She is alert. She exhibits normal muscle tone. Gait normal. GCS eye subscore is 4. GCS verbal subscore is 5. GCS motor subscore is 6.  Skin: She is not diaphoretic.  Psychiatric: She has a normal mood and affect. Her behavior is normal. Thought content normal.    ED Course  Procedures (including critical care time)  DIAGNOSTIC STUDIES: Oxygen Saturation is 95% on RA, adequate by my interpretation.    COORDINATION OF CARE: 10:20 PM-Discussed treatment plan with pt at bedside and pt agreed to plan.     Labs Review Labs Reviewed - No data to display  Imaging Review Dg Wrist Complete Left  01/27/2014   CLINICAL DATA:  Fall, wrist pain  EXAM: LEFT WRIST - COMPLETE 3+ VIEW  COMPARISON:  None.  FINDINGS: There is no evidence of fracture or dislocation. There is no evidence of arthropathy or other focal bone abnormality. Soft tissues are unremarkable.  IMPRESSION: Negative.   Electronically Signed   By: Jeannine Boga M.D.   On: 01/27/2014 00:28   Dg Wrist Complete Right  01/27/2014   CLINICAL DATA:  Fall, wrist pain  EXAM: RIGHT WRIST - COMPLETE 3+ VIEW  COMPARISON:  None.  FINDINGS: There is no evidence of fracture or dislocation. Scaphoid intact. There is no evidence of arthropathy or other focal bone abnormality. Soft tissues are unremarkable.  IMPRESSION: Negative.   Electronically Signed   By: Jeannine Boga M.D.   On: 01/27/2014 00:29    Dg Hand Complete Left  01/27/2014   CLINICAL DATA:  FALL, WRIST PAIN  EXAM: LEFT HAND - COMPLETE 3+ VIEW  COMPARISON:  None.  FINDINGS: There is no evidence of fracture or dislocation. Tiny osseous density at the base of the left fourth proximal phalanx is likely chronic in nature. Degenerative osteoarthritic changes present at the first [facility] joint. Soft tissues are unremarkable.  IMPRESSION: 1. No definite acute fracture or dislocation. 2. Tiny osseous density at the base of the left fourth proximal phalanx, favored to be chronic in nature. Correlation with site of pain for possible small acute avulsion injury at this site recommended. 3. Moderate degenerative osteoarthrosis at the first [facility] joint.   Electronically Signed   By: Jeannine Boga M.D.   On: 01/27/2014 00:27   Dg Hand Complete Right  01/27/2014   CLINICAL DATA:  Fall, wrist pain  EXAM: RIGHT HAND - COMPLETE 3+ VIEW  COMPARISON:  None.  FINDINGS: There is no evidence of fracture or dislocation. Tiny density adjacent to the pisiform on oblique projection is favored to be chronic in nature. There is no evidence of arthropathy or other focal bone abnormality. Soft tissues are unremarkable.  IMPRESSION: 1. No definite acute fracture or dislocation. 2. Tiny osseous density adjacent to the pisiform, favored to be chronic in nature. Correlation with site of pain recommended.   Electronically Signed   By: Jeannine Boga M.D.   On: 01/27/2014 00:33     EKG Interpretation None      MDM   Final diagnoses:  Fall, initial encounter  Bilateral hand pain    Afebrile, nontoxic patient with mechanical trip and fall in bathroom, falling on hands and knees.  Denies any pain at all except in bilateral hands and wrists.  The only bony tenderness is  over 1st metatarsal.  There is no snuffbox tenderness.  She has a very tiny abrasion to the left arm and this is hemostatic.  She refuses tetanus vx.  Pt is on Plavix but she did not hit her head  and she was in the ED several hours without any new symptoms and no progression of her symptoms.  No e/o bleeding.  Xrays negative.  (Pt is not tender over two areas needing clinical correlation).  Placed in left velcro thumb spica. PCP follow up.   Pt declined pain medications in ED and was comfortable with home PO tylenol.  May continue this at home.  Discussed result, findings, treatment, and follow up  with patient.  Pt given return precautions.  Pt verbalizes understanding and agrees with plan.       I personally performed the services described in this documentation, which was scribed in my presence. The recorded information has been reviewed and is accurate.    [place], PA-C 01/27/14 [phone]

## 2014-01-26 NOTE — ED Notes (Addendum)
Pt states she tripped and fell in bathroom just pta.  C/o pain to R wrist, R hand, and L hand. CMS intact. Denies LOC.

## 2014-01-27 MED ORDER — HYDROCODONE-ACETAMINOPHEN 5-325 MG PO TABS: 1.0000 | ORAL_TABLET | ORAL | Status: DC | PRN

## 2014-01-27 NOTE — Discharge Instructions (Signed)
Read the information below.  You may return to the Emergency Department at any time for worsening condition or any new symptoms that concern you. Do not take additional tylenol while taking the prescribed pain medication to avoid overdose. If you develop uncontrolled pain, weakness or numbness of the extremity, severe discoloration of the skin, or you are unable to use your hands or move your wrists, return to the ER for a recheck.

## 2014-01-27 NOTE — ED Provider Notes (Signed)
Medical screening examination/treatment/procedure(s) were performed by non-physician practitioner and as supervising physician I was immediately available for consultation/collaboration.   EKG Interpretation None        Merryl Hacker, MD 01/27/14 1539

## 2014-01-28 ENCOUNTER — Telehealth: Payer: Self-pay

## 2014-01-28 NOTE — Telephone Encounter (Signed)
Should not be lifting total care pt until assessed. No pain meds until assessed

## 2014-01-28 NOTE — Telephone Encounter (Signed)
Pt left v/m; pt was seen at [facility] ED after fall; pt has brace on lt hand and pain in rt wrist and has f/u appt on 01/29/14 with Webb Silversmith NP. Pt wants to know if should wait to take care of a  total care pt including having to lift the pt until after f/u appt on 01/29/14. Pt also wants to know if can get pain med (Norco) prior to appt or should pt wait and be rechecked before more pain med issued. Pt request cb.

## 2014-01-29 ENCOUNTER — Encounter: Payer: Self-pay | Admitting: Internal Medicine

## 2014-01-29 ENCOUNTER — Ambulatory Visit (INDEPENDENT_AMBULATORY_CARE_PROVIDER_SITE_OTHER): Payer: No Typology Code available for payment source | Admitting: Internal Medicine

## 2014-01-29 ENCOUNTER — Telehealth: Payer: Self-pay | Admitting: Family Medicine

## 2014-01-29 VITALS — BP 106/70 | HR 90 | Temp 98.0°F | Wt 178.0 lb

## 2014-01-29 DIAGNOSIS — M25531 Pain in right wrist: Secondary | ICD-10-CM

## 2014-01-29 DIAGNOSIS — M25539 Pain in unspecified wrist: Secondary | ICD-10-CM

## 2014-01-29 DIAGNOSIS — Y92009 Unspecified place in unspecified non-institutional (private) residence as the place of occurrence of the external cause: Secondary | ICD-10-CM

## 2014-01-29 DIAGNOSIS — M25532 Pain in left wrist: Principal | ICD-10-CM

## 2014-01-29 DIAGNOSIS — W19XXXA Unspecified fall, initial encounter: Secondary | ICD-10-CM

## 2014-01-29 DIAGNOSIS — W19XXXD Unspecified fall, subsequent encounter: Secondary | ICD-10-CM

## 2014-01-29 DIAGNOSIS — Z5189 Encounter for other specified aftercare: Secondary | ICD-10-CM

## 2014-01-29 NOTE — Telephone Encounter (Signed)
Left message on voicemail for patient to call back. 

## 2014-01-29 NOTE — Telephone Encounter (Signed)
Lm on cell with information as instructed

## 2014-01-29 NOTE — Progress Notes (Signed)
Pre visit review using our clinic review tool, if applicable. No additional management support is needed unless otherwise documented below in the visit note. 

## 2014-01-29 NOTE — Telephone Encounter (Signed)
Agree no NSAID with plavix. I agree with Rollene Fare - recommend she schedule tylenol 1000mg  three times daily with food.  If pain not controlled with this, f/u with me.

## 2014-01-29 NOTE — Progress Notes (Signed)
Subjective:    Patient ID: Karen [name], female    DOB: [birth year], 58 y.o.   MRN: [id number]  HPI  Pt presents to the clinic today for a hospital followup s/p fall. She tripped at her house 01/26/14, landing on her hands and knees. She went to the ER for further evaluation. She did have an abrasion to her left wrist, but declined tetanus booster. Xrays of her bilateral hands and wrist did not show acute fracture or dislocation. She declines pain medication at the hospital, saying that the tylenol she was taking at home was effective. She follows up today with c/o continued pain in her left wrist. She denies bruising or swelling. When her wrist is in the brace she reports 5/10 pain. When her wrist is out of the brace, she reports that she has 6/10 pain.   Review of Systems      Past Medical History  Diagnosis Date  . History of chicken pox   . Depression   . Type 2 diabetes, uncontrolled, with neuropathy     dx 1993  . Chronic bronchitis   . Syncopal episodes   . GERD (gastroesophageal reflux disease)   . Seasonal allergies   . High blood pressure   . HLD (hyperlipidemia)   . History of ovarian cyst   . Tubal pregnancy   . Tachyarrhythmia     ER visits with adenosine push to control  . CAD (coronary artery disease)     STEMI 07/2013 - treated with PTCA of the RCA followed by DES to the intermediate ramus  . Seizure 1977    off AEDs    Current Outpatient Prescriptions  Medication Sig Dispense Refill  . acetaminophen (TYLENOL) 500 MG tablet Take 1,000 mg by mouth every 6 (six) hours as needed.      Marland Kitchen antipyrine-benzocaine (AURALGAN) otic solution Place 3-4 drops into the right ear every 2 (two) hours as needed for ear pain.  10 mL  0  . aspirin EC 81 MG EC tablet Take 1 tablet (81 mg total) by mouth daily.      . citalopram (CELEXA) 20 MG tablet Take 1 tablet (20 mg total) by mouth daily.  90 tablet  2  . clopidogrel (PLAVIX) 75 MG tablet Take 1 tablet (75 mg total) by mouth  daily.  30 tablet  11  . gabapentin (NEURONTIN) 100 MG capsule Take 1 capsule (100 mg total) by mouth at bedtime.  90 capsule  3  . HYDROcodone-acetaminophen (NORCO/VICODIN) 5-325 MG per tablet Take 1 tablet by mouth every 4 (four) hours as needed for moderate pain or severe pain.  8 tablet  0  . insulin detemir (LEVEMIR) 100 UNIT/ML injection Inject 0.6 mLs (60 Units total) into the skin 2 (two) times daily.  3 vial  0  . lisinopril (ZESTRIL) 2.5 MG tablet Take 1 tablet (2.5 mg total) by mouth daily.  90 tablet  2  . metFORMIN (GLUCOPHAGE) 1000 MG tablet TAKE 1 TABLET BY MOUTH TWICE DAILY WITH MEALS  180 tablet  3  . metoprolol succinate (TOPROL-XL) 25 MG 24 hr tablet Take 12.5 mg by mouth 2 (two) times daily.      . nitroGLYCERIN (NITROSTAT) 0.4 MG SL tablet Place 1 tablet (0.4 mg total) under the tongue every 5 (five) minutes as needed for chest pain.  25 tablet  2  . pantoprazole (PROTONIX) 40 MG tablet Take 1 tablet (40 mg total) by mouth daily.  30 tablet  3  .  promethazine (PHENERGAN) 25 MG tablet Take 1 tablet (25 mg total) by mouth every 6 (six) hours as needed for nausea or vomiting.  30 tablet  0  . Simethicone (GAS-X PO) Take 2 tablets by mouth 4 (four) times daily as needed (gas).      . simvastatin (ZOCOR) 40 MG tablet Take 1 tablet (40 mg total) by mouth daily.  90 tablet  2   No current facility-administered medications for this visit.    Allergies  Allergen Reactions  . Erythromycin Other (See Comments)    Stomach cramps  . Lipitor [Atorvastatin] Nausea Only  . Penicillins Rash    Family History  Problem Relation Age of Onset  . Diabetes Mother   . Hyperlipidemia Mother   . Hypertension Mother   . Stroke Mother   . Coronary artery disease Mother     CABG  . Heart disease Mother   . Diabetes Father   . Hypertension Father   . Hyperlipidemia Father   . Coronary artery disease Father     CABG  . Heart disease Father     CHF  . Pancreatic cancer Father   . Heart  disease Brother     arrhythmia  . Diabetes Maternal Grandmother   . Diabetes Maternal Grandfather   . Diabetes Paternal Grandmother   . Diabetes Paternal Grandfather   . Kidney disease Neg Hx     History   Social History  . Marital Status: Widowed    Spouse Name: N/A    Number of Children: 0  . Years of Education: 12th grade   Occupational History  . ALF, [employer], med tech/PCA    Social History Main Topics  . Smoking status: Never Smoker   . Smokeless tobacco: Never Used  . Alcohol Use: No  . Drug Use: No  . Sexual Activity: Not on file   Other Topics Concern  . Not on file   Social History Narrative   Caffeine: 6 diet sodas/day   Lives with 2 dogs.   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  Musculoskeletal: Pt reports left wrist pain. Denies decrease in range of motion, difficulty with gait, muscle pain or joint swelling.   Skin: Denies redness, rashes, lesions or ulcercations.   No other specific complaints in a complete review of systems (except as listed in HPI above).  Objective:   Physical Exam   BP 106/70  Pulse 90  Temp(Src) 98 F (36.7 C) (Oral)  Wt 178 lb (80.74 kg)  SpO2 97% Wt Readings from Last 3 Encounters:  01/29/14 178 lb (80.74 kg)  12/07/13 162 lb (73.483 kg)  10/01/13 178 lb (80.74 kg)    General: Appears her stated age, obese but well developed, well nourished in NAD. Cardiovascular: Normal rate and rhythm. S1,S2 noted.  No murmur, rubs or gallops noted.  Pulmonary/Chest: Normal effort and positive vesicular breath sounds. No respiratory distress. No wheezes, rales or ronchi noted.  Musculoskeletal: Normal flexion, extension and rotation of the left wrist. No swelling or bruising noted. No pain with palpation of the radius, ulna or carpals.   BMET    Component Value  Date/Time   NA 141 09/15/2013 1310   K 4.4 09/15/2013 1310   CL 103 09/15/2013 1310   CO2 24 09/15/2013 1310   GLUCOSE 154* 09/15/2013 1310  BUN 11 09/15/2013 1310   CREATININE 0.64 09/15/2013 1310   CALCIUM 8.6 09/15/2013 1310   GFRNONAA >90 09/15/2013 1310   GFRAA >90 09/15/2013 1310    Lipid Panel     Component Value Date/Time   CHOL 273* 07/24/2013 0020   TRIG 966* 07/24/2013 0020   HDL 29* 07/24/2013 0020   CHOLHDL 9.4 07/24/2013 0020   VLDL UNABLE TO CALCULATE IF TRIGLYCERIDE OVER 400 mg/dL 07/24/2013 0020   LDLCALC UNABLE TO CALCULATE IF TRIGLYCERIDE OVER 400 mg/dL 07/24/2013 0020    CBC    Component Value Date/Time   WBC 4.8 09/15/2013 1310   RBC 3.96 09/15/2013 1310   HGB 11.1* 09/15/2013 1310   HCT 34.2* 09/15/2013 1310   PLT 137* 09/15/2013 1310   MCV 86.4 09/15/2013 1310   MCH 28.0 09/15/2013 1310   MCHC 32.5 09/15/2013 1310   RDW 13.9 09/15/2013 1310   LYMPHSABS 2.2 09/08/2013 1605   MONOABS 0.4 09/08/2013 1605   EOSABS 0.2 09/08/2013 1605   BASOSABS 0.0 09/08/2013 1605    Hgb A1C Lab Results  Component Value Date   HGBA1C 10.9* 07/24/2013        Assessment & Plan:   Left wrist pain s/p fall:  She presents today asking for Norco Advised her the her exam and imaging findings does not warrant a narcotic for pain Advised her to take tylenol 1 gm every 6 hours for pain Icing the wrist for 15 minutes BID will help She asks for tramadol- advised her she should not take tramadol or NSAIDs while on plavix Work note provided to not lift > 10 lbs x 1 week  If pain persist or worsens, follow up with PCP

## 2014-01-29 NOTE — Patient Instructions (Addendum)

## 2014-01-29 NOTE — Telephone Encounter (Signed)
Pt was seen in office today by Webb Silversmith, NP and filled out triage form after visit.  Triage form states"  "Saw NP today and was not satisfied with her saying that she does not prescribe narcotics for the type of pain I have.  I cannot take anti-inflammatory meds because I take Plavix.  Sunday night I had a fall at home, tried to catch myself injuring R wrist and area just below thumb.  I was asked if I needed a stronger pain med at that time, I said no that I just took tylenol before I came to the hospital.  After my visit at hospital, I was prescribed Norco 5/325mg  every 4 hours for pain.  (I took them 5-6 hours apart both Mon and Tues because my thumb was very painful (a 6 or 7).  Now that I am going back to work, I know not to take pain meds while working/driving.  I was going to take tylenol in the daytime and something a little stronger at bedtime because sometimes the pain is worse at night.  Apparently when I fell, I aggravated some arthritis which I did not know I had.  Is there anything you can prescribe that will help with the pain?"

## 2014-01-30 MED ORDER — TRAMADOL HCL 50 MG PO TABS
50.0000 mg | ORAL_TABLET | Freq: Two times a day (BID) | ORAL | Status: DC | PRN
Start: 2014-01-30 — End: 2014-02-21

## 2014-01-30 NOTE — Telephone Encounter (Signed)
Sent patient message via mychart. I want her to try tylenol 1000mg  TID with meals for next day or 2, along with heating pad or ice (whichever soothes better) - if pain not controlled with this may take tramadol given inability to take NSAIDs. plz phone short tramadol course to her pharmacy.

## 2014-01-30 NOTE — Telephone Encounter (Signed)
Pt states she is in pain throughout the day and during the night with her wrist.  She said Tylenol is not helping at all.  She said she cannot keep getting off work to come in. She said she would have seen Dr. Danise Mina yesterday if she had known he wanted to see her to give her something stronger for pain.  Pt states you can send her a MyChart message back.

## 2014-01-30 NOTE — Telephone Encounter (Signed)
Tramadol phoned in to pharmacy. 

## 2014-02-03 ENCOUNTER — Other Ambulatory Visit: Payer: Self-pay | Admitting: Family Medicine

## 2014-02-06 ENCOUNTER — Ambulatory Visit (INDEPENDENT_AMBULATORY_CARE_PROVIDER_SITE_OTHER)
Admission: RE | Admit: 2014-02-06 | Discharge: 2014-02-06 | Disposition: A | Discharge status: Home / Self Care | Payer: No Typology Code available for payment source | Source: Ambulatory Visit | Attending: Family Medicine | Admitting: Family Medicine

## 2014-02-06 ENCOUNTER — Encounter: Payer: Self-pay | Admitting: Family Medicine

## 2014-02-06 ENCOUNTER — Ambulatory Visit (INDEPENDENT_AMBULATORY_CARE_PROVIDER_SITE_OTHER): Payer: No Typology Code available for payment source | Admitting: Family Medicine

## 2014-02-06 ENCOUNTER — Encounter: Payer: Self-pay | Admitting: *Deleted

## 2014-02-06 VITALS — BP 94/64 | HR 62 | Temp 98.0°F | Wt 179.5 lb

## 2014-02-06 DIAGNOSIS — Z23 Encounter for immunization: Secondary | ICD-10-CM

## 2014-02-06 DIAGNOSIS — S6390XA Sprain of unspecified part of unspecified wrist and hand, initial encounter: Secondary | ICD-10-CM

## 2014-02-06 DIAGNOSIS — S63602A Unspecified sprain of left thumb, initial encounter: Secondary | ICD-10-CM | POA: Insufficient documentation

## 2014-02-06 DIAGNOSIS — S63602D Unspecified sprain of left thumb, subsequent encounter: Secondary | ICD-10-CM

## 2014-02-06 DIAGNOSIS — Z5189 Encounter for other specified aftercare: Secondary | ICD-10-CM

## 2014-02-06 NOTE — Patient Instructions (Addendum)
Flu shot today. Xray today. No heavy lifting for next 2 weeks then return to see me. Letter provided. Continue tramadol, continue tylenol, use icy hot. Let me know if you want to price out voltaren gel.

## 2014-02-06 NOTE — Progress Notes (Signed)
BP 94/64  Pulse 62  Temp(Src) 98 F (36.7 C) (Oral)  Wt 179 lb 8 oz (81.421 kg)  SpO2 95%   CC: f/u wrist pain  Subjective:    Patient ID: Karen [name], female    DOB: 1956/02/23, 58 y.o.   MRN: [id number]  HPI: Karen [name] is a 58 y.o. female presenting on 02/06/2014 for Follow-up, Wrist Pain and Hand Pain   DOI 01/26/2014: Tripped at home in bathroom tile floor, landed on hands and knees. Reviewed ER note and Regina's note. Dx R wrist sprain with normal xrays. Treated initially with hydrocodone then tramadol and brace which helps. Points to anatomical snuff box on left. Not improving much. Worse pain with movement of thumb. Chronic ache.  Has been placed on 10lb weight restriction.  She has been using tylenol and tramadol for pain. Not very effective. Want to avoid NSAIDs 2/2 plavix use.   LEFT HAND - COMPLETE 3+ VIEW  COMPARISON: None.  FINDINGS:  There is no evidence of fracture or dislocation. Tiny osseous  density at the base of the left fourth proximal phalanx is likely  chronic in nature. Degenerative osteoarthritic changes present at  the first [facility] joint. Soft tissues are unremarkable.  IMPRESSION:  1. No definite acute fracture or dislocation.  2. Tiny osseous density at the base of the left fourth proximal  phalanx, favored to be chronic in nature. Correlation with site of  pain for possible small acute avulsion injury at this site  recommended.  3. Moderate degenerative osteoarthrosis at the first [facility] joint.  Electronically Signed  By: Jeannine Boga M.D.  On: 01/27/2014 00:27  LEFT WRIST - COMPLETE 3+ VIEW  COMPARISON: None.  FINDINGS:  There is no evidence of fracture or dislocation. There is no  evidence of arthropathy or other focal bone abnormality. Soft  tissues are unremarkable.  IMPRESSION:  Negative.  Electronically Signed  By: Jeannine Boga M.D.  On: 01/27/2014 00:28  Relevant past medical, surgical, family and social history  reviewed and updated as indicated.  Allergies and medications reviewed and updated. Current Outpatient Prescriptions on File Prior to Visit  Medication Sig  . acetaminophen (TYLENOL) 500 MG tablet Take 1,000 mg by mouth every 6 (six) hours as needed.  Marland Kitchen antipyrine-benzocaine (AURALGAN) otic solution Place 3-4 drops into the right ear every 2 (two) hours as needed for ear pain.  Marland Kitchen aspirin EC 81 MG EC tablet Take 1 tablet (81 mg total) by mouth daily.  . citalopram (CELEXA) 20 MG tablet Take 1 tablet (20 mg total) by mouth daily.  . clopidogrel (PLAVIX) 75 MG tablet Take 1 tablet (75 mg total) by mouth daily.  Marland Kitchen gabapentin (NEURONTIN) 100 MG capsule Take 1 capsule (100 mg total) by mouth at bedtime.  Marland Kitchen HYDROcodone-acetaminophen (NORCO/VICODIN) 5-325 MG per tablet Take 1 tablet by mouth every 4 (four) hours as needed for moderate pain or severe pain.  Marland Kitchen LEVEMIR 100 UNIT/ML injection INJECT 0.6 MLS (60 UNITS TOTAL) INTO THE SKIN 2 (TWO) TIMES DAILY.  Marland Kitchen lisinopril (ZESTRIL) 2.5 MG tablet Take 1 tablet (2.5 mg total) by mouth daily.  . metFORMIN (GLUCOPHAGE) 1000 MG tablet TAKE 1 TABLET BY MOUTH TWICE DAILY WITH MEALS  . metoprolol succinate (TOPROL-XL) 25 MG 24 hr tablet Take 12.5 mg by mouth 2 (two) times daily.  . nitroGLYCERIN (NITROSTAT) 0.4 MG SL tablet Place 1 tablet (0.4 mg total) under the tongue every 5 (five) minutes as needed for chest pain.  . pantoprazole (PROTONIX)  40 MG tablet Take 1 tablet (40 mg total) by mouth daily.  . promethazine (PHENERGAN) 25 MG tablet Take 1 tablet (25 mg total) by mouth every 6 (six) hours as needed for nausea or vomiting.  . Simethicone (GAS-X PO) Take 2 tablets by mouth 4 (four) times daily as needed (gas).  . simvastatin (ZOCOR) 40 MG tablet Take 1 tablet (40 mg total) by mouth daily.  . traMADol (ULTRAM) 50 MG tablet Take 1 tablet (50 mg total) by mouth 2 (two) times daily as needed (breakthrough pain).   No current facility-administered medications on  file prior to visit.    Review of Systems Per HPI unless specifically indicated above    Objective:    BP 94/64  Pulse 62  Temp(Src) 98 F (36.7 C) (Oral)  Wt 179 lb 8 oz (81.421 kg)  SpO2 95%  Physical Exam  Nursing note and vitals reviewed. Constitutional: She appears well-developed and well-nourished. No distress.  Musculoskeletal: She exhibits no edema.  R wrist WNL L wrist tender at [facility] joint, mild tenderness at anatomical snuff box, point tenderness at palmar base of thumb MC. FROM flexion/extension at wrist.   Results for orders placed in visit on 12/31/13  HM DIABETES EYE EXAM      Result Value Ref Range   HM Diabetic Eye Exam Retinopathy (*) No Retinopathy      Assessment & Plan:   Problem List Items Addressed This Visit   Sprain of left thumb - Primary     Mild discomfort at base of L thumb at [facility] joint - anticipate thumb sprain with bony contusion at base of thumb but will repeat xrays to r/o scaphoid fracture given mild discomfort at anatomical snuff box. Discussed this will likely take 4-6 wks to fully heal rec continue tylenol/tramadol. Pt declines to price out voltaren gel - likely too expensive for her - will try icy/hot as well. RTC 2 wks to reassess. No lifting >10 lbs for next 2 weeks at work.    Relevant Orders      DG Wrist Complete Left       Follow up plan: Return if symptoms worsen or fail to improve.

## 2014-02-06 NOTE — Assessment & Plan Note (Addendum)
Mild discomfort at base of L thumb at [facility] joint - anticipate thumb sprain with bony contusion at base of thumb but will repeat xrays to r/o scaphoid fracture given mild discomfort at anatomical snuff box. Discussed this will likely take 4-6 wks to fully heal rec continue tylenol/tramadol. Pt declines to price out voltaren gel - likely too expensive for her - will try icy/hot as well. RTC 2 wks to reassess. No lifting >10 lbs for next 2 weeks at work.

## 2014-02-06 NOTE — Addendum Note (Signed)
Addended by: Tammi Sou on: 02/06/2014 09:54 AM   Modules accepted: Orders

## 2014-02-06 NOTE — Progress Notes (Signed)
Pre visit review using our clinic review tool, if applicable. No additional management support is needed unless otherwise documented below in the visit note. 

## 2014-02-12 ENCOUNTER — Other Ambulatory Visit: Payer: Self-pay | Admitting: *Deleted

## 2014-02-12 MED ORDER — PANTOPRAZOLE SODIUM 40 MG PO TBEC: 40.0000 mg | DELAYED_RELEASE_TABLET | Freq: Every day | ORAL | Status: DC

## 2014-02-13 ENCOUNTER — Telehealth: Payer: Self-pay

## 2014-02-13 MED ORDER — DICLOFENAC SODIUM 1 % TD GEL: 1.0000 "application " | Freq: Three times a day (TID) | TRANSDERMAL | Status: DC

## 2014-02-13 NOTE — Telephone Encounter (Signed)
Pt notified, she verbalized understanding. She was concerned about the price of the medication, but will call back after checking with the pharmacy if she is unable to afford it.

## 2014-02-13 NOTE — Telephone Encounter (Signed)
Pt left v/m; pt was seen on 02/06/14 for wrist pain and request Voltaren gel to [facility] for wrist pain. Pt request cb.

## 2014-02-13 NOTE — Telephone Encounter (Signed)
Sent in.plz notify pt.  

## 2014-02-20 ENCOUNTER — Ambulatory Visit: Payer: No Typology Code available for payment source | Admitting: Family Medicine

## 2014-02-21 ENCOUNTER — Ambulatory Visit (INDEPENDENT_AMBULATORY_CARE_PROVIDER_SITE_OTHER): Payer: No Typology Code available for payment source | Admitting: Family Medicine

## 2014-02-21 ENCOUNTER — Encounter: Payer: Self-pay | Admitting: Family Medicine

## 2014-02-21 ENCOUNTER — Ambulatory Visit: Payer: No Typology Code available for payment source | Admitting: Family Medicine

## 2014-02-21 VITALS — BP 124/78 | HR 68 | Temp 98.1°F | Wt 177.2 lb

## 2014-02-21 DIAGNOSIS — E11339 Type 2 diabetes mellitus with moderate nonproliferative diabetic retinopathy without macular edema: Secondary | ICD-10-CM

## 2014-02-21 DIAGNOSIS — I10 Essential (primary) hypertension: Secondary | ICD-10-CM

## 2014-02-21 DIAGNOSIS — IMO0002 Reserved for concepts with insufficient information to code with codable children: Secondary | ICD-10-CM

## 2014-02-21 DIAGNOSIS — Z0000 Encounter for general adult medical examination without abnormal findings: Secondary | ICD-10-CM

## 2014-02-21 DIAGNOSIS — S63602D Unspecified sprain of left thumb, subsequent encounter: Secondary | ICD-10-CM

## 2014-02-21 DIAGNOSIS — E785 Hyperlipidemia, unspecified: Secondary | ICD-10-CM

## 2014-02-21 DIAGNOSIS — E1165 Type 2 diabetes mellitus with hyperglycemia: Secondary | ICD-10-CM

## 2014-02-21 DIAGNOSIS — M189 Osteoarthritis of first carpometacarpal joint, unspecified: Secondary | ICD-10-CM | POA: Insufficient documentation

## 2014-02-21 DIAGNOSIS — I2510 Atherosclerotic heart disease of native coronary artery without angina pectoris: Secondary | ICD-10-CM

## 2014-02-21 DIAGNOSIS — E113399 Type 2 diabetes mellitus with moderate nonproliferative diabetic retinopathy without macular edema, unspecified eye: Secondary | ICD-10-CM

## 2014-02-21 DIAGNOSIS — Z9861 Coronary angioplasty status: Secondary | ICD-10-CM

## 2014-02-21 DIAGNOSIS — M1812 Unilateral primary osteoarthritis of first carpometacarpal joint, left hand: Secondary | ICD-10-CM

## 2014-02-21 DIAGNOSIS — E1140 Type 2 diabetes mellitus with diabetic neuropathy, unspecified: Secondary | ICD-10-CM

## 2014-02-21 LAB — LIPID PANEL
Cholesterol: 205 mg/dL — ABNORMAL HIGH (ref 0–200)
HDL: 26 mg/dL — ABNORMAL LOW (ref >39.00)
NONHDL: 179
Total CHOL/HDL Ratio: 8
Triglycerides: 799 mg/dL — ABNORMAL HIGH (ref 0.0–149.0)
VLDL: 159.8 mg/dL — ABNORMAL HIGH (ref 0.0–40.0)

## 2014-02-21 LAB — BASIC METABOLIC PANEL
BUN: 20 mg/dL (ref 6–23)
CHLORIDE: 102 meq/L (ref 96–112)
CO2: 23 mEq/L (ref 19–32)
Calcium: 9.2 mg/dL (ref 8.4–10.5)
Creatinine, Ser: 0.6 mg/dL (ref 0.4–1.2)
GFR: 103.71 mL/min (ref >60.00)
Glucose, Bld: 312 mg/dL — ABNORMAL HIGH (ref 70–99)
Potassium: 4.5 mEq/L (ref 3.5–5.1)
Sodium: 133 mEq/L — ABNORMAL LOW (ref 135–145)

## 2014-02-21 LAB — HEMOGLOBIN A1C: HEMOGLOBIN A1C: 11.2 % — AB (ref 4.6–6.5)

## 2014-02-21 LAB — LDL CHOLESTEROL, DIRECT: Direct LDL: 67 mg/dL

## 2014-02-21 NOTE — Assessment & Plan Note (Signed)
Avoid NSAIDs 2/2 plavix/ASA use. Could not afford voltaren gel. Continue tramadol, tylenol prn. Refer to [facility] for input on thumb sprain/ CMC arthritis after fall.

## 2014-02-21 NOTE — Assessment & Plan Note (Signed)
See below. Not healing as expected - uncontrolled sugars may be playing a part.  Anticipate more CMC pain, but started after fall - she did not have pain prior to 01/26/2014.  Will refer to [facility] for further eval. Extended work restrictions for next week.

## 2014-02-21 NOTE — Progress Notes (Signed)
BP 124/78  Pulse 68  Temp(Src) 98.1 F (36.7 C) (Oral)  Wt 177 lb 4 oz (80.4 kg)   CC: f/u visit  Subjective:    Patient ID: Karen [name], female    DOB: Dec 21, 1955, 58 y.o.   MRN: [id number]  HPI: Karen [name] is a 58 y.o. female presenting on 02/21/2014 for Follow-up   Recent L thumb sprain - DOI: 01/26/2014 after fall on outstretched hand after tripped over bathroom step. Persistent pain. See prior notes for details. voltaren gel was denied by insurance. Tramadol and tylenol not effective. She has been regular with wrist brace, avoiding heavy lifting.   DM - regularly does check sugars twice daily 2 hr postprandial - 200-300, depending on meal.  Pt states she can get novolin R OTC. Compliant with antihyperglycemic regimen which includes: levemir 60u bid, metformin 1000mg  bid. Levemir free at [employer] (through insurance program). Denies low sugars or hypoglycemic symptoms.  Endorses some paresthesias of feet, gabapentin helps. Last diabetic eye exam 12/2013 - retinopathy.  Pneumovax: 05/2013.  Prevnar: not done.  HTN - Compliant with current antihypertensive regimen of lisinopril 2.5mg  daily, metoprolol succinate 12.5mg  bid.  Does not check blood pressures at home.  No low blood pressure symptoms of dizziness/syncope.  Denies HA, vision changes, CP/tightness, SOB, leg swelling.   HLD - compliant with simvastatin 40mg  daily. No myalgias.  Relevant past medical, surgical, family and social history reviewed and updated as indicated.  Allergies and medications reviewed and updated. Current Outpatient Prescriptions on File Prior to Visit  Medication Sig  . acetaminophen (TYLENOL) 500 MG tablet Take 1,000 mg by mouth every 6 (six) hours as needed.  Marland Kitchen antipyrine-benzocaine (AURALGAN) otic solution Place 3-4 drops into the right ear every 2 (two) hours as needed for ear pain.  Marland Kitchen aspirin EC 81 MG EC tablet Take 1 tablet (81 mg total) by mouth daily.  . citalopram (CELEXA) 20 MG tablet  Take 1 tablet (20 mg total) by mouth daily.  . clopidogrel (PLAVIX) 75 MG tablet Take 1 tablet (75 mg total) by mouth daily.  Marland Kitchen gabapentin (NEURONTIN) 100 MG capsule Take 1 capsule (100 mg total) by mouth at bedtime.  Marland Kitchen LEVEMIR 100 UNIT/ML injection INJECT 0.6 MLS (60 UNITS TOTAL) INTO THE SKIN 2 (TWO) TIMES DAILY.  Marland Kitchen lisinopril (ZESTRIL) 2.5 MG tablet Take 1 tablet (2.5 mg total) by mouth daily.  . metFORMIN (GLUCOPHAGE) 1000 MG tablet TAKE 1 TABLET BY MOUTH TWICE DAILY WITH MEALS  . nitroGLYCERIN (NITROSTAT) 0.4 MG SL tablet Place 1 tablet (0.4 mg total) under the tongue every 5 (five) minutes as needed for chest pain.  . pantoprazole (PROTONIX) 40 MG tablet Take 1 tablet (40 mg total) by mouth daily.  . promethazine (PHENERGAN) 25 MG tablet Take 1 tablet (25 mg total) by mouth every 6 (six) hours as needed for nausea or vomiting.  . Simethicone (GAS-X PO) Take 2 tablets by mouth 4 (four) times daily as needed (gas).  . simvastatin (ZOCOR) 40 MG tablet Take 1 tablet (40 mg total) by mouth daily.   No current facility-administered medications on file prior to visit.    Review of Systems Per HPI unless specifically indicated above    Objective:    BP 124/78  Pulse 68  Temp(Src) 98.1 F (36.7 C) (Oral)  Wt 177 lb 4 oz (80.4 kg)  Physical Exam  Nursing note and vitals reviewed. Constitutional: She appears well-developed and well-nourished. No distress.  HENT:  Head: Normocephalic and  atraumatic.  Right Ear: External ear normal.  Left Ear: External ear normal.  Nose: Nose normal.  Mouth/Throat: Oropharynx is clear and moist. No oropharyngeal exudate.  Eyes: Conjunctivae and EOM are normal. Pupils are equal, round, and reactive to light. No scleral icterus.  Neck: Normal range of motion. Neck supple.  Cardiovascular: Normal rate, regular rhythm, normal heart sounds and intact distal pulses.   No murmur heard. Pulmonary/Chest: Effort normal and breath sounds normal. No respiratory  distress. She has no wheezes. She has no rales.  Musculoskeletal: She exhibits no edema.  Diabetic foot exam: Normal inspection No skin breakdown No calluses  Normal DP/PT pulses Normal sensation to light touch and diminished monofilament throughout Nails normal  Remains tender to palpation at base of L thumb at [facility], mildly positive finkelstein, no anatomical snuffbox tenderness, no UCL laxity or tenderness  Lymphadenopathy:    She has no cervical adenopathy.  Skin: Skin is warm and dry. No rash noted.  Psychiatric: She has a normal mood and affect.   Results for orders placed in visit on 12/31/13  HM DIABETES EYE EXAM      Result Value Ref Range   HM Diabetic Eye Exam Retinopathy (*) No Retinopathy      Assessment & Plan:   Problem List Items Addressed This Visit   Type 2 diabetes, uncontrolled, with neuropathy - Primary     Chronically uncontrolled. Update A1c today. Reports persistently uncontrolled sugars despite metformin 1000mg  bid and levemir 60u bid. Will add on mealtime insulin novolin R 10 units with lunch (largest meal of day) and I have asked her to keep log of sugars before and after lunch for next week and drop off here for review. Foot exam today. RTC 1 mo f/u.    Relevant Medications      insulin regular (NOVOLIN R,HUMULIN R) 100 units/mL injection   Other Relevant Orders      Hemoglobin A1c      HM DIABETES FOOT EXAM (Completed)   Sprain of left thumb     See below. Not healing as expected - uncontrolled sugars may be playing a part.  Anticipate more CMC pain, but started after fall - she did not have pain prior to 01/26/2014.  Will refer to [facility] for further eval. Extended work restrictions for next week.    Relevant Orders      Ambulatory referral to Sports Medicine   HTN (hypertension)     Chronic, stable. Continue low dose metoprolol and lisinopril.    Relevant Medications      metoprolol tartrate (LOPRESSOR) 25 MG tablet   Other Relevant Orders       Basic metabolic panel   Dyslipidemia     Compliant with simvastatin 40mg . Pt has not tolerated lipitor in past. Check FLP today as fasting.    Relevant Orders      Lipid panel      Basic metabolic panel   Diabetic retinopathy     Per recent eye exam.    Relevant Medications      insulin regular (NOVOLIN R,HUMULIN R) 100 units/mL injection   CMC arthritis, thumb, degenerative     Avoid NSAIDs 2/2 plavix/ASA use. Could not afford voltaren gel. Continue tramadol, tylenol prn. Refer to [facility] for input on thumb sprain/ CMC arthritis after fall.    Relevant Orders      Ambulatory referral to Sports Medicine   CAD S/P percutaneous coronary angioplasty     Continue aspirin, plavix, statin, ACEI, beta blocker.  Relevant Medications      metoprolol tartrate (LOPRESSOR) 25 MG tablet       Follow up plan: Return in about 1 month (around 03/24/2014), or as needed, for follow up visit.

## 2014-02-21 NOTE — Assessment & Plan Note (Signed)
Chronically uncontrolled. Update A1c today. Reports persistently uncontrolled sugars despite metformin 1000mg  bid and levemir 60u bid. Will add on mealtime insulin novolin R 10 units with lunch (largest meal of day) and I have asked her to keep log of sugars before and after lunch for next week and drop off here for review. Foot exam today. RTC 1 mo f/u.

## 2014-02-21 NOTE — Assessment & Plan Note (Signed)
Compliant with simvastatin 40mg . Pt has not tolerated lipitor in past. Check FLP today as fasting.

## 2014-02-21 NOTE — Patient Instructions (Signed)
Blood work today. I'd like you to start novolin R 10 units with lunch. Monitor sugars a little more closely - check before lunch and then 2 hours after lunch, drop off log in 1 week after you start novolin R. Return to see me in 1 month for diabetes follow up. Pass by Marion's office for referral to Dr. Lorelei Pont to check left wrist. We will prolong work restrictions until you see Dr. Lorelei Pont.

## 2014-02-21 NOTE — Assessment & Plan Note (Signed)
Continue aspirin, plavix, statin, ACEI, beta blocker.

## 2014-02-21 NOTE — Progress Notes (Signed)
Pre visit review using our clinic review tool, if applicable. No additional management support is needed unless otherwise documented below in the visit note. 

## 2014-02-21 NOTE — Assessment & Plan Note (Signed)
Chronic, stable. Continue low dose metoprolol and lisinopril.  

## 2014-02-21 NOTE — Assessment & Plan Note (Signed)
Per recent eye exam.

## 2014-02-24 ENCOUNTER — Telehealth: Payer: Self-pay | Admitting: Family Medicine

## 2014-02-24 NOTE — Telephone Encounter (Signed)
emmi emailed °

## 2014-02-27 ENCOUNTER — Ambulatory Visit (INDEPENDENT_AMBULATORY_CARE_PROVIDER_SITE_OTHER)
Admission: RE | Admit: 2014-02-27 | Discharge: 2014-02-27 | Disposition: A | Discharge status: Home / Self Care | Payer: No Typology Code available for payment source | Source: Ambulatory Visit | Attending: Family Medicine | Admitting: Family Medicine

## 2014-02-27 ENCOUNTER — Ambulatory Visit (INDEPENDENT_AMBULATORY_CARE_PROVIDER_SITE_OTHER): Payer: No Typology Code available for payment source | Admitting: Family Medicine

## 2014-02-27 ENCOUNTER — Encounter: Payer: Self-pay | Admitting: Family Medicine

## 2014-02-27 VITALS — BP 110/60 | HR 63 | Temp 98.1°F | Ht 61.0 in | Wt 178.5 lb

## 2014-02-27 DIAGNOSIS — M25532 Pain in left wrist: Secondary | ICD-10-CM

## 2014-02-27 DIAGNOSIS — M1812 Unilateral primary osteoarthritis of first carpometacarpal joint, left hand: Secondary | ICD-10-CM

## 2014-02-27 NOTE — Progress Notes (Signed)
Dr. Frederico Hamman T. Kassady Laboy, MD, [facility] [place], [address] Phone: [phone] Fax: [phone]  02/27/2014  Patient: Karen [name], MRN: [id number], DOB: 10-Jan-1956, 58 y.o.  Primary Physician:  [name], MD  Chief Complaint: Wrist Pain  Subjective:   Karen [name] is a 58 y.o. very pleasant female patient who presents with the following:  Consulting Physician: Dr. [name] Reason: Wrist pain  [place] on 01/27/2014, everything in the finger and wrist hurts and is throbing and hurting. Left hand, R-handed.   She tripped on the date of injury on her tile floor. She landed on her hands and knees, but she is not exactly sure how she landed on her hand or wrist. Given some vicodin, tramadol, then a thumb spica splint.   She still has pain on the radial aspect of her left wrist and in the thumb.   She has known [place] OA on last films on the 1st, which has been an issue with aching and pain at work. History is also significant for being on Plavix.  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 Since her injury, she has been a 10 pound weight restriction.  Past Medical History, Surgical History, Social History, Family History, Problem List, Medications, and Allergies have been reviewed and updated if relevant.  GEN: No fevers, chills. Nontoxic. Primarily MSK c/o today. MSK: Detailed in the HPI GI: tolerating PO intake without difficulty Neuro: No numbness, parasthesias, or tingling associated. Otherwise the pertinent positives of the ROS are noted above.   Objective:   BP 110/60  Pulse 63  Temp(Src) 98.1 F (36.7 C) (Oral)  Ht 5\' 1"  (1.549 m)  Wt 178 lb 8 oz (80.967 kg)  BMI 33.74 kg/m2   GEN: WDWN, NAD, Non-toxic, Alert & Oriented x 3 HEENT: Atraumatic, Normocephalic.  Ears and Nose: No external deformity. EXTR: No clubbing/cyanosis/edema NEURO: Normal gait.  PSYCH: Normally interactive. Conversant. Not depressed or  anxious appearing.  Calm demeanor.   Hand: LEFT Ecchymosis or edema: mild dorsal swelling ROM wrist/hand/digits/elbow: mild restriction with flexion and extension Carpals, MCP's, digits: NT Distal Ulna and Radius: NT Supination lift test: neg Cysts/nodules: neg Finkelstein's test: neg Snuffbox tenderness: YES Scaphoid tubercle: TTP Hook of Hamate: NT Resisted supination: NT Full composite fist Grip, all digits: 5/5 str No tenosynovitis Axial load test: POS Phalen's: neg Tinel's: neg Atrophy: neg  Hand sensation: intact   Radiology: Dg Wrist Complete Left  02/27/2014   CLINICAL DATA:  Left wrist pain.  Fall a month ago.  EXAM: LEFT WRIST - COMPLETE 3+ VIEW  COMPARISON:  02/06/2014.  FINDINGS: There is no evidence of fracture or dislocation. Mild degenerative changes particularly at the first carpometacarpal joint. Soft tissues are unremarkable.  IMPRESSION: Mild degenerative change noted about the first carpometacarpal joint. No acute abnormality identified. Exam stable from prior study.   Electronically Signed   By: Marcello Moores  Register   On: 02/27/2014 12:46   Dg Wrist Complete Left  02/06/2014   CLINICAL DATA:  Pain at palmar base of thumb. Evaluate scaphoid. History of diabetes.  EXAM: LEFT WRIST - COMPLETE 3+ VIEW  COMPARISON:  Radiographs 01/26/2014.  FINDINGS: The bones appear mildly demineralized. There is no evidence of acute fracture or dislocation. The scaphoid appears intact. There are degenerative changes at the first carpometacarpal articulation.  IMPRESSION: No acute osseous findings.  First CMC osteoarthritis.   Electronically Signed   By: Modesta Messing.D.  On: 02/06/2014 09:50    Assessment and Plan:   Left wrist pain - Plan: DG Wrist Complete Left  Primary osteoarthritis of first carpometacarpal joint of left hand   Despite multiple sets of plain films, immobilization for 1 month, I think there is significant concern for occult derangement with this person's  wrist. Cannot exclude subtle fracture or ligament disruption. I recommended getting an MR arthrogram of the LEFT wrist to evaluate for such.   At this point, patient declined, and would like to give more time and reassess. Reasonable alternative.   Continue with light-duty, 10 pound restrictions.   I appreciate the opportunity to evaluate this very friendly patient. If you have any question regarding her care or prognosis, do not hesitate to ask.   Follow-up: Return in about 5 weeks (around 04/03/2014).  New Prescriptions   No medications on file   Orders Placed This Encounter  Procedures  . DG Wrist Complete Left    Signed,  Taline Nass T. Shoua Ulloa, MD   Patient's Medications  New Prescriptions   No medications on file  Previous Medications   ACETAMINOPHEN (TYLENOL) 500 MG TABLET    Take 1,000 mg by mouth every 6 (six) hours as needed.   ANTIPYRINE-BENZOCAINE (AURALGAN) OTIC SOLUTION    Place 3-4 drops into the right ear every 2 (two) hours as needed for ear pain.   ASPIRIN EC 81 MG EC TABLET    Take 1 tablet (81 mg total) by mouth daily.   CITALOPRAM (CELEXA) 20 MG TABLET    Take 1 tablet (20 mg total) by mouth daily.   CLOPIDOGREL (PLAVIX) 75 MG TABLET    Take 1 tablet (75 mg total) by mouth daily.   GABAPENTIN (NEURONTIN) 100 MG CAPSULE    Take 1 capsule (100 mg total) by mouth at bedtime.   INSULIN REGULAR (NOVOLIN R,HUMULIN R) 100 UNITS/ML INJECTION    Inject 10 Units into the skin daily before lunch.   LEVEMIR 100 UNIT/ML INJECTION    INJECT 0.6 MLS (60 UNITS TOTAL) INTO THE SKIN 2 (TWO) TIMES DAILY.   LISINOPRIL (ZESTRIL) 2.5 MG TABLET    Take 1 tablet (2.5 mg total) by mouth daily.   METFORMIN (GLUCOPHAGE) 1000 MG TABLET    TAKE 1 TABLET BY MOUTH TWICE DAILY WITH MEALS   METOPROLOL TARTRATE (LOPRESSOR) 25 MG TABLET    Take 12.5 mg by mouth 2 (two) times daily.   NITROGLYCERIN (NITROSTAT) 0.4 MG SL TABLET    Place 1 tablet (0.4 mg total) under the tongue every 5 (five)  minutes as needed for chest pain.   PANTOPRAZOLE (PROTONIX) 40 MG TABLET    Take 1 tablet (40 mg total) by mouth daily.   PROMETHAZINE (PHENERGAN) 25 MG TABLET    Take 1 tablet (25 mg total) by mouth every 6 (six) hours as needed for nausea or vomiting.   SIMETHICONE (GAS-X PO)    Take 2 tablets by mouth 4 (four) times daily as needed (gas).   SIMVASTATIN (ZOCOR) 40 MG TABLET    Take 1 tablet (40 mg total) by mouth daily.  Modified Medications   No medications on file  Discontinued Medications   No medications on file

## 2014-02-27 NOTE — Progress Notes (Signed)
Pre visit review using our clinic review tool, if applicable. No additional management support is needed unless otherwise documented below in the visit note. 

## 2014-03-10 ENCOUNTER — Other Ambulatory Visit: Payer: Self-pay | Admitting: Family Medicine

## 2014-03-17 ENCOUNTER — Emergency Department (HOSPITAL_COMMUNITY)
Admission: EM | Admit: 2014-03-17 | Discharge: 2014-03-17 | Disposition: A | Discharge status: Home / Self Care | Payer: No Typology Code available for payment source | Attending: Emergency Medicine | Admitting: Emergency Medicine

## 2014-03-17 ENCOUNTER — Encounter (HOSPITAL_COMMUNITY): Payer: Self-pay | Admitting: *Deleted

## 2014-03-17 DIAGNOSIS — I10 Essential (primary) hypertension: Secondary | ICD-10-CM | POA: Diagnosis not present

## 2014-03-17 DIAGNOSIS — F329 Major depressive disorder, single episode, unspecified: Secondary | ICD-10-CM | POA: Diagnosis not present

## 2014-03-17 DIAGNOSIS — G40909 Epilepsy, unspecified, not intractable, without status epilepticus: Secondary | ICD-10-CM | POA: Diagnosis not present

## 2014-03-17 DIAGNOSIS — Z880 Allergy status to penicillin: Secondary | ICD-10-CM | POA: Insufficient documentation

## 2014-03-17 DIAGNOSIS — Z8619 Personal history of other infectious and parasitic diseases: Secondary | ICD-10-CM | POA: Insufficient documentation

## 2014-03-17 DIAGNOSIS — Z7902 Long term (current) use of antithrombotics/antiplatelets: Secondary | ICD-10-CM | POA: Insufficient documentation

## 2014-03-17 DIAGNOSIS — I2510 Atherosclerotic heart disease of native coronary artery without angina pectoris: Secondary | ICD-10-CM | POA: Diagnosis not present

## 2014-03-17 DIAGNOSIS — K219 Gastro-esophageal reflux disease without esophagitis: Secondary | ICD-10-CM | POA: Insufficient documentation

## 2014-03-17 DIAGNOSIS — K029 Dental caries, unspecified: Secondary | ICD-10-CM | POA: Insufficient documentation

## 2014-03-17 DIAGNOSIS — Z792 Long term (current) use of antibiotics: Secondary | ICD-10-CM | POA: Diagnosis not present

## 2014-03-17 DIAGNOSIS — Z8742 Personal history of other diseases of the female genital tract: Secondary | ICD-10-CM | POA: Insufficient documentation

## 2014-03-17 DIAGNOSIS — Z794 Long term (current) use of insulin: Secondary | ICD-10-CM | POA: Insufficient documentation

## 2014-03-17 DIAGNOSIS — K0889 Other specified disorders of teeth and supporting structures: Secondary | ICD-10-CM

## 2014-03-17 DIAGNOSIS — K006 Disturbances in tooth eruption: Secondary | ICD-10-CM | POA: Diagnosis not present

## 2014-03-17 DIAGNOSIS — Z8709 Personal history of other diseases of the respiratory system: Secondary | ICD-10-CM | POA: Diagnosis not present

## 2014-03-17 DIAGNOSIS — E1140 Type 2 diabetes mellitus with diabetic neuropathy, unspecified: Secondary | ICD-10-CM | POA: Insufficient documentation

## 2014-03-17 DIAGNOSIS — Z7982 Long term (current) use of aspirin: Secondary | ICD-10-CM | POA: Insufficient documentation

## 2014-03-17 DIAGNOSIS — K088 Other specified disorders of teeth and supporting structures: Secondary | ICD-10-CM | POA: Diagnosis present

## 2014-03-17 DIAGNOSIS — Z9889 Other specified postprocedural states: Secondary | ICD-10-CM | POA: Insufficient documentation

## 2014-03-17 DIAGNOSIS — E785 Hyperlipidemia, unspecified: Secondary | ICD-10-CM | POA: Insufficient documentation

## 2014-03-17 MED ORDER — CEPHALEXIN 500 MG PO CAPS: 500.0000 mg | ORAL_CAPSULE | Freq: Four times a day (QID) | ORAL | Status: DC

## 2014-03-17 MED ORDER — HYDROCODONE-ACETAMINOPHEN 5-325 MG PO TABS: 1.0000 | ORAL_TABLET | Freq: Four times a day (QID) | ORAL | Status: DC | PRN

## 2014-03-17 NOTE — ED Provider Notes (Signed)
CSN: [id number]     Arrival date & time 03/17/14  1928 History   First MD Initiated Contact with Patient 03/17/14 2053     Chief Complaint  Patient presents with  . Dental Pain    (Consider location/radiation/quality/duration/timing/severity/associated sxs/prior Treatment) Patient is a 58 y.o. female presenting with tooth pain. The history is provided by the patient. No language interpreter was used.  Dental Pain Location:  Lower Lower teeth location:  30/RL 1st molar Quality:  Throbbing Severity:  Moderate Onset quality:  Gradual Duration:  3 days Timing:  Constant Progression:  Worsening Chronicity:  Recurrent Context: dental caries, dental fracture and poor dentition   Context: not trauma   Previous work-up:  Filled cavity Relieved by:  Nothing Worsened by:  Touching and pressure Ineffective treatments:  Acetaminophen Associated symptoms: facial pain   Associated symptoms: no difficulty swallowing, no drooling, no facial swelling, no fever, no neck pain, no oral bleeding, no oral lesions and no trismus   Risk factors: diabetes, lack of dental care and periodontal disease     Past Medical History  Diagnosis Date  . History of chicken pox   . Depression   . Type 2 diabetes, uncontrolled, with neuropathy     dx 1993  . Chronic bronchitis   . Syncopal episodes   . GERD (gastroesophageal reflux disease)   . Seasonal allergies   . High blood pressure   . HLD (hyperlipidemia)   . History of ovarian cyst   . Tubal pregnancy   . Tachyarrhythmia     ER visits with adenosine push to control  . CAD (coronary artery disease)     STEMI 07/2013 - treated with PTCA of the RCA followed by DES to the intermediate ramus  . Seizure 1977    off AEDs   Past Surgical History  Procedure Laterality Date  . Tonsillectomy and adenoidectomy  1965  . Ovarian cyst surgery  1998  . Dilation and curettage of uterus  1984  . Ectopic pregnancy surgery Right 1984    tube removal 2/2  hemorrhage  . Rotator cuff repair  2006    Right  . Cardiac catheterization  07/2013    angioplasty to RCA and DES to ramus intermedius   Family History  Problem Relation Age of Onset  . Diabetes Mother   . Hyperlipidemia Mother   . Hypertension Mother   . Stroke Mother   . Coronary artery disease Mother     CABG  . Heart disease Mother   . Diabetes Father   . Hypertension Father   . Hyperlipidemia Father   . Coronary artery disease Father     CABG  . Heart disease Father     CHF  . Pancreatic cancer Father   . Heart disease Brother     arrhythmia  . Diabetes Maternal Grandmother   . Diabetes Maternal Grandfather   . Diabetes Paternal Grandmother   . Diabetes Paternal Grandfather   . Kidney disease Neg Hx    History  Substance Use Topics  . Smoking status: Never Smoker   . Smokeless tobacco: Never Used  . Alcohol Use: No   OB History    No data available      Review of Systems  Constitutional: Negative for fever.  HENT: Positive for dental problem. Negative for drooling, facial swelling and mouth sores.   Musculoskeletal: Negative for neck pain.  All other systems reviewed and are negative.   Allergies  Erythromycin; Lipitor; and Penicillins  Home  Medications   Prior to Admission medications   Medication Sig Start Date End Date Taking? Authorizing Provider  acetaminophen (TYLENOL) 500 MG tablet Take 1,000 mg by mouth every 6 (six) hours as needed.    Historical Provider, MD  antipyrine-benzocaine Toniann Fail) otic solution Place 3-4 drops into the right ear every 2 (two) hours as needed for ear pain. 12/14/13   Audelia Hives Presson, PA  aspirin EC 81 MG EC tablet Take 1 tablet (81 mg total) by mouth daily. 07/25/13   Sueanne Margarita, MD  cephALEXin (KEFLEX) 500 MG capsule Take 1 capsule (500 mg total) by mouth 4 (four) times daily. 03/17/14   Antonietta Breach, PA-C  citalopram (CELEXA) 20 MG tablet Take 1 tablet (20 mg total) by mouth daily. 09/10/13   [name], MD  citalopram (CELEXA) 20 MG tablet TAKE 1 TABLET BY MOUTH DAILY 03/10/14   [name], MD  clopidogrel (PLAVIX) 75 MG tablet Take 1 tablet (75 mg total) by mouth daily. 08/09/13   Burtis Junes, NP  gabapentin (NEURONTIN) 100 MG capsule Take 1 capsule (100 mg total) by mouth at bedtime. 09/11/13   [name], MD  HYDROcodone-acetaminophen (NORCO/VICODIN) 5-325 MG per tablet Take 1 tablet by mouth every 6 (six) hours as needed for moderate pain or severe pain. 03/17/14   Antonietta Breach, PA-C  insulin regular (NOVOLIN R,HUMULIN R) 100 units/mL injection Inject 10 Units into the skin daily before lunch.    Historical Provider, MD  LEVEMIR 100 UNIT/ML injection INJECT 0.6 MLS (60 UNITS TOTAL) INTO THE SKIN 2 (TWO) TIMES DAILY. 02/03/14   [name], MD  lisinopril (ZESTRIL) 2.5 MG tablet Take 1 tablet (2.5 mg total) by mouth daily. 09/10/13   [name], MD  metFORMIN (GLUCOPHAGE) 1000 MG tablet TAKE 1 TABLET BY MOUTH TWICE DAILY WITH MEALS    [name], MD  metoprolol tartrate (LOPRESSOR) 25 MG tablet Take 12.5 mg by mouth 2 (two) times daily. 12/19/13   Historical Provider, MD  nitroGLYCERIN (NITROSTAT) 0.4 MG SL tablet Place 1 tablet (0.4 mg total) under the tongue every 5 (five) minutes as needed for chest pain. 07/25/13   Brittainy Erie Noe, PA-C  pantoprazole (PROTONIX) 40 MG tablet Take 1 tablet (40 mg total) by mouth daily. 02/12/14   [name], MD  promethazine (PHENERGAN) 25 MG tablet Take 1 tablet (25 mg total) by mouth every 6 (six) hours as needed for nausea or vomiting. 12/07/13   Waldemar Dickens, MD  Simethicone (GAS-X PO) Take 2 tablets by mouth 4 (four) times daily as needed (gas).    Historical Provider, MD  simvastatin (ZOCOR) 40 MG tablet Take 1 tablet (40 mg total) by mouth daily. 09/10/13   [name], MD   BP 130/63 mmHg  Pulse 74  Temp(Src) 98.2 F (36.8 C) (Oral)  Resp 18  SpO2 98%   Physical Exam  Constitutional: She is  oriented to person, place, and time. She appears well-developed and well-nourished. No distress.  Nontoxic/nonseptic appearing  HENT:  Head: Normocephalic and atraumatic.  Mouth/Throat: Uvula is midline and oropharynx is clear and moist. No oral lesions. No trismus in the jaw. Abnormal dentition. Dental caries present. No uvula swelling. No oropharyngeal exudate.    Most dentition is no longer present. Patient with erosion to R lower 1st molar. Dental caries present as well as TTP to dentition. Mild gingival swelling without fluctuance. No facial swelling. No trismus.  Eyes: Conjunctivae and EOM are normal. No scleral icterus.  Neck: Normal range of motion.  Pulmonary/Chest: Effort normal. No respiratory distress. She has no wheezes.  Chest expansion symmetric  Musculoskeletal: Normal range of motion.  Neurological: She is alert and oriented to person, place, and time. She exhibits normal muscle tone. Coordination normal.  Skin: Skin is warm and dry. No rash noted. She is not diaphoretic. No erythema. No pallor.  Psychiatric: She has a normal mood and affect. Her behavior is normal.  Nursing note and vitals reviewed.   ED Course  Procedures (including critical care time) Labs Review Labs Reviewed - No data to display  Imaging Review No results found.   EKG Interpretation None      MDM   Final diagnoses:  [place] caries    Patient with toothache. No gross abscess. Exam unconcerning for Ludwig's angina or spread of infection. Will treat with Keflex and pain medicine. Urged patient to follow-up with dentist. Referral and resource guide provided. Return precautions discussed. Patient agreeable to plan with no unaddressed concerns.   Filed Vitals:   03/17/14 1942 03/17/14 2128  BP: 135/62 130/63  Pulse: 70 74  Temp: 98.2 F (36.8 C)   TempSrc: Oral   Resp: 20 18  SpO2: 100% 98%       Antonietta Breach, PA-C 03/17/14 2155

## 2014-03-17 NOTE — Discharge Instructions (Signed)
Dental Pain °A tooth ache may be caused by cavities (tooth decay). Cavities expose the nerve of the tooth to air and hot or cold temperatures. It may come from an infection or abscess (also called a boil or furuncle) around your tooth. It is also often caused by dental caries (tooth decay). This causes the pain you are having. °DIAGNOSIS  °Your caregiver can diagnose this problem by exam. °TREATMENT  °· If caused by an infection, it may be treated with medications which kill germs (antibiotics) and pain medications as prescribed by your caregiver. Take medications as directed. °· Only take over-the-counter or prescription medicines for pain, discomfort, or fever as directed by your caregiver. °· Whether the tooth ache today is caused by infection or dental disease, you should see your dentist as soon as possible for further care. °SEEK MEDICAL CARE IF: °The exam and treatment you received today has been provided on an emergency basis only. This is not a substitute for complete medical or dental care. If your problem worsens or new problems (symptoms) appear, and you are unable to meet with your dentist, call or return to this location. °SEEK IMMEDIATE MEDICAL CARE IF:  °· You have a fever. °· You develop redness and swelling of your face, jaw, or neck. °· You are unable to open your mouth. °· You have severe pain uncontrolled by pain medicine. °MAKE SURE YOU:  °· Understand these instructions. °· Will watch your condition. °· Will get help right away if you are not doing well or get worse. °Document Released: 05/02/2005 Document Revised: 07/25/2011 Document Reviewed: 12/19/2007 °ExitCare® Patient Information ©2015 [employer]. This information is not intended to replace advice given to you by your health care provider. Make sure you discuss any questions you have with your health care provider. ° °Emergency Department Resource Guide °1) Find a Doctor and Pay Out of Pocket °Although you won't have to find out who  is covered by your insurance plan, it is a good idea to ask around and get recommendations. You will then need to call the office and see if the doctor you have chosen will accept you as a new patient and what types of options they offer for patients who are self-pay. Some doctors offer discounts or will set up payment plans for their patients who do not have insurance, but you will need to ask so you aren't surprised when you get to your appointment. ° °2) Contact Your Local Health Department °Not all health departments have doctors that can see patients for sick visits, but many do, so it is worth a call to see if yours does. If you don't know where your local health department is, you can check in your phone book. The CDC also has a tool to help you locate your state's health department, and many state websites also have listings of all of their local health departments. ° °3) Find a Walk-in Clinic °If your illness is not likely to be very severe or complicated, you may want to try a walk in clinic. These are popping up all over the country in pharmacies, drugstores, and shopping centers. They're usually staffed by nurse practitioners or physician assistants that have been trained to treat common illnesses and complaints. They're usually fairly quick and inexpensive. However, if you have serious medical issues or chronic medical problems, these are probably not your best option. ° °No Primary Care Doctor: °- Call [facility] at  832-8000 - they can help you locate a primary   care doctor that  accepts your insurance, provides certain services, etc. °- Physician Referral Service- 1-800-533-3463 ° °Chronic Pain Problems: °Organization         Address  Phone   Notes  °[facility]  (336) 297-2271 Patients need to be referred by their primary care doctor.  ° °Medication Assistance: °Organization         Address  Phone   Notes  °[place] [facility] [address]  [address] °[place], [place] [address] (336) 641-8030 --Must be a resident of [place] °-- Must have NO insurance coverage whatsoever (no Medicaid/ Medicare, etc.) °-- The pt. MUST have a primary care doctor that directs their care regularly and follows them in the community °  °[facility]  (866) 331-1348   °[employer]  (888) 892-1162   ° °Agencies that provide inexpensive medical care: °Organization         Address  Phone   Notes  °[facility]  (336) 832-8035   °[facility]    (336) 832-7272   °[facility] [address] °[place], [place] [address] (336) 832-4777   °[facility] [address], °[place] (336) 271-4999   °[facility]    (336) 373-0678   °[facility]    (336) 272-1050   °[facility] ° [address], [place] Phone:  (336) 832-4444, Fax:  (336) 832-4440 Hours of Operation:  9 am - 6 pm, M-F.  Also accepts Medicaid/Medicare and self-pay.  °[facility] ° [address], [place] Phone: (336) 832-3150, Fax: (336) [phone]. Hours of Operation:  8:30 am - 5:30 pm, M-F.  Also accepts Medicaid and self-pay.  °[facility] [place] [address], [place] Phone: (336) 878-6027   °[facility] [address], [place], [place] (336)723-1848, Ext. 123 Mondays & Thursdays: 7-9 AM.  First 15 patients are seen on a first come, first serve basis. °  ° °Medicaid-accepting [place] Providers: ° °Organization         Address  Phone   Notes  °[facility] [address], [place] (336) 641-2100 Also accepts self-pay patients.  °[facility] [address], [place] ° (336) 856-9996   °[facility] [address], [place] (336) 288-8857   °[facility] [address] [place] [address], [place] (336) 299-7000   °Veita Bland [address], [place]  ° (336) 373-1557 Only accepts [place] Access Medicaid patients after they have their name applied to their card.  ° °Self-Pay (no insurance) in [place]: ° °Organization         Address  Phone   Notes  °Sickle Cell Patients, [facility] [address], [place] (336) 832-1970   °[facility] [address], [place] (336) 832-4400   °[facility] ° [address], [place] (336) 992-4800   °[facility]/Dr. Osei-Bonsu ° [address] [place] [address], [place] or [address], [place] (336) [phone] Phone number for both [place] and [place] locations is the same.  °[facility] [address], [place] (336) 299-0000   °[facility] [address] [place] [address],  or [address] (336) 852-7530 °(336) 878-2260   °  [facility] [address], [place] (336) 350-1642, phone; (336) 294-5005, fax Sees patients 1st and 3rd Saturday of every month.  Must not qualify for public or private insurance (i.e. Medicaid, Medicare, [place] Health Choice, Veterans' Benefits) • Household income should be no more than 200% of the poverty level •The clinic cannot treat you if you are pregnant or think you are pregnant • Sexually transmitted diseases are not treated at the clinic.  ° ° °Dental Care: °Organization         Address  Phone  Notes  °[facility] [address], [place] (336) 641-6152 Accepts children up to age 21 who are enrolled in Medicaid or [place] Health Choice; pregnant women with a Medicaid card; and children who have applied for Medicaid or [place] Health Choice, but were declined, whose parents can pay a reduced fee at time of service.  °[facility] [place]  [address], [place] (336) 641-7733 Accepts children up to age 21 who are  enrolled in Medicaid or [place] Health Choice; pregnant women with a Medicaid card; and children who have applied for Medicaid or [place] Health Choice, but were declined, whose parents can pay a reduced fee at time of service.  °[facility] ° [address], [place] (336) 641-4533 Patients are seen by appointment only. Walk-ins are not accepted. [facility] will see patients 18 years of age and older. °Monday - Tuesday (8am-5pm) °Most Wednesdays (8:30-5pm) °$30 per visit, cash only  °[facility] ° [address], [place] (336) 641-4533 Patients are seen by appointment only. Walk-ins are not accepted. [facility] will see patients 18 years of age and older. °One Wednesday Evening (Monthly: Volunteer Based).  $30 per visit, cash only  °[facility]  (919) 537-3737 for adults; Children under age 4, call Graduate Pediatric Dentistry at ([phone]) [phone]. Children aged 4-14, please call (919) [phone] to request a pediatric application. ° Dental services are provided in all areas of dental care including fillings, crowns and bridges, complete and partial dentures, implants, gum treatment, root canals, and extractions. Preventive care is also provided. Treatment is provided to both adults and children. °Patients are selected via a lottery and there is often a waiting list. °  °[facility] [address], °[place] ° (336) 763-8833 [email or web address] °  °[facility] [address], [place], [place] (336)723-1848, Ext. 123 Second and Fourth Thursday of each month, opens at 6:30 AM; Clinic ends at 9 AM.  Patients are seen on a first-come first-served basis, and a limited number are seen during each clinic.  ° °[facility] ° [address], [place], [place] (336) 723-7904   Eligibility Requirements °You must have lived in [place], [place], or [place] counties for at least the last three months. °  You  cannot be eligible for state or federal sponsored healthcare insurance, including Veterans Administration, Medicaid, or Medicare. °  You generally cannot be eligible for healthcare insurance through your employer.  °  How to apply: °Eligibility screenings are held every Tuesday and Wednesday afternoon from 1:00 pm until 4:00 pm. You do not need an appointment for the interview!  °[facility] [address], [place], [place] 336-631-2330   °[facility]  336-342-8273   °[facility]  336-703-3100   °[facility]  336-570-6415   ° °Behavioral Health Resources in the Community: °Intensive Outpatient Programs °Organization         Address  Phone  Notes  °[facility] [address], [place], [place] 336-878-6098   °[facility] [address], [place], [place] 336-832-9800   °[facility] [address], [place], [place] ° 336-882-2125   °[facility] [address],  °[place], [place] 1-800-853-5163 or 336-641-4981   °Substance Abuse Resources °Organization         Address  Phone  Notes  °[facility]  336-882-2125   °[facility]  336-784-9470   °[facility]  336-285-9073   °[facility]  336-845-3988   °Residential & Outpatient Substance Abuse Program  1-800-659-3381   °Psychological Services °Organization         Address  Phone  Notes  ° Health  336- 832-9600   °[facility]  336- 378-7881   °[facility] [address], [place] 1-800-853-5163 or 336-641-4981   ° °Mobile Crisis Teams °Organization         Address  Phone  Notes  °[facility], Mobile Crisis Care Unit  1-877-626-1772   °Assertive °[facility] ° [address] [place], [place] 336-834-9664   °Sharon DeEsch [address] °[place] 336-554-5454   ° °Self-Help/Support  Groups °Organization         Address  Phone             Notes  °[facility] - variety of support groups  336- 373-1402 Call for more information  °Narcotics Anonymous (NA), [facility] [address], °[place]  2 meetings at this location  ° °Residential Treatment Programs °Organization         Address  Phone  Notes  °[facility] [address],    °[place]  1-866-801-8205   °[facility] ° [address], [place], [place] 704-293-8524   °[facility] [address], [place] 336-845-3988 Admissions: 8am-3pm M-F  °[facility] [address]    °[place], [place] 336-841-1104   °[facility] [address], [place], [place] 336-379-7146   °[facility] [address]  °[place], [place] 336-285-9073   °[facility] Intensive Outpatient [address] [place] [address], [place], [place] 336-852-3033   °[facility] [address]  °[place], [place] 1-877-615-2722 or 336-784-9470   °[facility] [address] [place], [place] 336-227-7417 Accepts Medicaid  °[facility] [address]  °[place] 1-800-659-3381 Substance Abuse/Addiction Treatment  ° °[place] Behavioral Health Resources °Organization         Address  Phone  Notes  °[facility]  (888) 581-9988   °Julie Brannon, PhD [address] [place], [place]   (336) 349-5553 or (336) 951-0000   °[facility]   [address] °[place], [place] (336) 349-4454   °[facility] [address], [place], [place] (336) 342-8316 Insurance/Medicaid/sponsorship through [facility]  °[facility] [address]                                    [place],  (336) 342-8316 Therapy/tele-psych/case  °[facility]   [address]  ° [place], [place] (336) 349-2233    °Dr. Arfeen  (336) 349-4544   °[facility] [place]  [facility]  [employer] [facility] [place] [facility] 1) [address], [place] °2) [address], [place] °3)  [address], [place] (336) 349-3220 °(336) 342-7768 ° °(336) 342-8140   °[place] [facility] (336) 342-1394 or (336) 342-3537 (After Hours)    ° ° ° °

## 2014-03-17 NOTE — ED Notes (Signed)
Pt A&OX4, ambulatory at d/c with steady gait, NAD 

## 2014-03-17 NOTE — ED Notes (Signed)
Pt in c/o toothache for the last few days, reports the tooth chipped about two months ago and symptoms have been getting progressively worse

## 2014-03-20 ENCOUNTER — Encounter: Payer: Self-pay | Admitting: Family Medicine

## 2014-03-20 MED ORDER — HYDROCODONE-ACETAMINOPHEN 5-325 MG PO TABS: 1.0000 | ORAL_TABLET | Freq: Three times a day (TID) | ORAL | Status: DC | PRN

## 2014-03-20 MED ORDER — HYDROCODONE-ACETAMINOPHEN 5-325 MG PO TABS: 1.0000 | ORAL_TABLET | Freq: Four times a day (QID) | ORAL | Status: DC | PRN

## 2014-03-20 NOTE — Telephone Encounter (Signed)
Ok to provide #20 vicodin 1 tab TID prn pain.  Printed and placed in Kim's box. Needs to see dentist for further treatment. plz notify patient.

## 2014-03-21 NOTE — Telephone Encounter (Signed)
Patient notified via Mychart and Rx placed up front for pick up. 

## 2014-03-24 ENCOUNTER — Telehealth: Payer: Self-pay | Admitting: Family Medicine

## 2014-03-24 NOTE — Telephone Encounter (Signed)
No samples available. Message left notifying patient.

## 2014-03-24 NOTE — Telephone Encounter (Signed)
Pt is requesting to see if we have any samples of Levemir. She is between paychecks and cannot afford to purchase any.

## 2014-03-27 ENCOUNTER — Encounter: Payer: Self-pay | Admitting: Family Medicine

## 2014-03-27 ENCOUNTER — Ambulatory Visit (INDEPENDENT_AMBULATORY_CARE_PROVIDER_SITE_OTHER): Payer: No Typology Code available for payment source | Admitting: Family Medicine

## 2014-03-27 VITALS — BP 110/60 | HR 62 | Temp 98.3°F | Ht 61.0 in | Wt 184.8 lb

## 2014-03-27 DIAGNOSIS — M25532 Pain in left wrist: Secondary | ICD-10-CM

## 2014-03-27 DIAGNOSIS — M1812 Unilateral primary osteoarthritis of first carpometacarpal joint, left hand: Secondary | ICD-10-CM

## 2014-03-27 NOTE — Progress Notes (Signed)
Pre visit review using our clinic review tool, if applicable. No additional management support is needed unless otherwise documented below in the visit note. 

## 2014-03-27 NOTE — Progress Notes (Signed)
Dr. Frederico Hamman T. Estephanie Hubbs, MD, [facility] [place], [address] Phone: [phone] Fax: [phone]  03/27/2014  Patient: Karen [name], MRN: [id number], DOB: 03/31/1956, 58 y.o.  Primary Physician:  [name], MD  Chief Complaint: Follow-up  Subjective:   Karen [name] is a 58 y.o. very pleasant female patient who presents with the following:  The details of the patient's initial injury are as below.  On the last office visit, I recommended that the patient have advanced imaging, and at that point she wanted to continue with conservative management. I felt that that was reasonable, and we placed her in a 10 pound weight restriction at work. At this point, she is essentially not better at all. She has been immobilized in a thumb spica splint.  02/27/2014 Last OV with Owens Loffler, MD  Consulting Physician: Dr. [name] Reason: Wrist pain  [place] on 01/27/2014, everything in the finger and wrist hurts and is throbing and hurting. Left hand, R-handed.   She tripped on the date of injury on her tile floor. She landed on her hands and knees, but she is not exactly sure how she landed on her hand or wrist. Given some vicodin, tramadol, then a thumb spica splint.   She still has pain on the radial aspect of her left wrist and in the thumb.   She has known [place] OA on last films on the 1st, which has been an issue with aching and pain at work. History is also significant for being on Plavix.  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 Since her injury, she has been a 10 pound weight restriction.  Past Medical History, Surgical History, Social History, Family History, Problem List, Medications, and Allergies have been reviewed and updated if relevant.  GEN: No fevers, chills. Nontoxic. Primarily MSK c/o today. MSK: Detailed in the HPI GI: tolerating PO intake without difficulty Neuro: No numbness, parasthesias, or tingling  associated. Otherwise the pertinent positives of the ROS are noted above.   Objective:   BP 110/60 mmHg  Pulse 62  Temp(Src) 98.3 F (36.8 C) (Oral)  Ht 5\' 1"  (1.549 m)  Wt 184 lb 12 oz (83.802 kg)  BMI 34.93 kg/m2   GEN: WDWN, NAD, Non-toxic, Alert & Oriented x 3 HEENT: Atraumatic, Normocephalic.  Ears and Nose: No external deformity. EXTR: No clubbing/cyanosis/edema NEURO: Normal gait.  PSYCH: Normally interactive. Conversant. Not depressed or anxious appearing.  Calm demeanor.   Hand: LEFT Ecchymosis or edema: mild dorsal swelling ROM wrist/hand/digits/elbow: mild restriction with flexion and extension Carpals, MCP's, digits: NT Distal Ulna and Radius: NT Supination lift test: neg Cysts/nodules: neg Finkelstein's test: neg Snuffbox tenderness: YES Scaphoid tubercle: TTP Hook of Hamate: NT Full composite fist Grip, all digits: 5/5 str No tenosynovitis Axial load test: POS, moderate Phalen's: neg Tinel's: neg Atrophy: neg  Hand sensation: intact   Radiology: Dg Wrist Complete Left  02/27/2014   CLINICAL DATA:  Left wrist pain.  Fall a month ago.  EXAM: LEFT WRIST - COMPLETE 3+ VIEW  COMPARISON:  02/06/2014.  FINDINGS: There is no evidence of fracture or dislocation. Mild degenerative changes particularly at the first carpometacarpal joint. Soft tissues are unremarkable.  IMPRESSION: Mild degenerative change noted about the first carpometacarpal joint. No acute abnormality identified. Exam stable from prior study.   Electronically Signed   By: [place]   On: 02/27/2014 12:46   Dg Wrist Complete Left  02/06/2014  CLINICAL DATA:  Pain at palmar base of thumb. Evaluate scaphoid. History of diabetes.  EXAM: LEFT WRIST - COMPLETE 3+ VIEW  COMPARISON:  Radiographs 01/26/2014.  FINDINGS: The bones appear mildly demineralized. There is no evidence of acute fracture or dislocation. The scaphoid appears intact. There are degenerative changes at the first carpometacarpal  articulation.  IMPRESSION: No acute osseous findings.  First CMC osteoarthritis.   Electronically Signed   By: Camie Patience M.D.   On: 02/06/2014 09:50    Assessment and Plan:   Left wrist pain - Plan: MR Wrist Left W Contrast, DG Fluoro Guide Ndl Plc/BX  Primary osteoarthritis of first carpometacarpal joint of left hand - Plan: MR Wrist Left W Contrast, DG Fluoro Guide Ndl Plc/BX   The patient is doing poorly after a fall in September. She has been treated adequately from a conservative standpoint with 2 sets of negative x-rays. She still is having dramatic pain in her wrist. Obtain an MR arthrogram of the left wrist to evaluate for bony injury, bony necrosis, or occult ligamentous derangement  Orders Placed This Encounter  Procedures  . MR Wrist Left W Contrast  . DG Fluoro Guide Ndl Plc/BX     Follow-up: No Follow-up on file.  Signed,  Maud Deed. Cady Hafen, MD   Patient's Medications  New Prescriptions   No medications on file  Previous Medications   ACETAMINOPHEN (TYLENOL) 500 MG TABLET    Take 1,000 mg by mouth every 6 (six) hours as needed.   ANTIPYRINE-BENZOCAINE (AURALGAN) OTIC SOLUTION    Place 3-4 drops into the right ear every 2 (two) hours as needed for ear pain.   ASPIRIN EC 81 MG EC TABLET    Take 1 tablet (81 mg total) by mouth daily.   CEPHALEXIN (KEFLEX) 500 MG CAPSULE    Take 1 capsule (500 mg total) by mouth 4 (four) times daily.   CITALOPRAM (CELEXA) 20 MG TABLET    Take 1 tablet (20 mg total) by mouth daily.   CLOPIDOGREL (PLAVIX) 75 MG TABLET    Take 1 tablet (75 mg total) by mouth daily.   GABAPENTIN (NEURONTIN) 100 MG CAPSULE    Take 1 capsule (100 mg total) by mouth at bedtime.   HYDROCODONE-ACETAMINOPHEN (NORCO/VICODIN) 5-325 MG PER TABLET    Take 1 tablet by mouth 3 (three) times daily as needed for moderate pain or severe pain.   INSULIN REGULAR (NOVOLIN R,HUMULIN R) 100 UNITS/ML INJECTION    Inject 10 Units into the skin daily before lunch.   LEVEMIR 100  UNIT/ML INJECTION    INJECT 0.6 MLS (60 UNITS TOTAL) INTO THE SKIN 2 (TWO) TIMES DAILY.   LISINOPRIL (ZESTRIL) 2.5 MG TABLET    Take 1 tablet (2.5 mg total) by mouth daily.   METFORMIN (GLUCOPHAGE) 1000 MG TABLET    TAKE 1 TABLET BY MOUTH TWICE DAILY WITH MEALS   METOPROLOL TARTRATE (LOPRESSOR) 25 MG TABLET    Take 12.5 mg by mouth 2 (two) times daily.   NITROGLYCERIN (NITROSTAT) 0.4 MG SL TABLET    Place 1 tablet (0.4 mg total) under the tongue every 5 (five) minutes as needed for chest pain.   PANTOPRAZOLE (PROTONIX) 40 MG TABLET    Take 1 tablet (40 mg total) by mouth daily.   PROMETHAZINE (PHENERGAN) 25 MG TABLET    Take 1 tablet (25 mg total) by mouth every 6 (six) hours as needed for nausea or vomiting.   SIMETHICONE (GAS-X PO)    Take 2 tablets  by mouth 4 (four) times daily as needed (gas).   SIMVASTATIN (ZOCOR) 40 MG TABLET    Take 1 tablet (40 mg total) by mouth daily.  Modified Medications   No medications on file  Discontinued Medications   CITALOPRAM (CELEXA) 20 MG TABLET    TAKE 1 TABLET BY MOUTH DAILY

## 2014-03-28 ENCOUNTER — Ambulatory Visit: Payer: No Typology Code available for payment source | Admitting: Family Medicine

## 2014-03-31 ENCOUNTER — Other Ambulatory Visit: Payer: Self-pay | Admitting: Family Medicine

## 2014-04-03 ENCOUNTER — Other Ambulatory Visit: Payer: Self-pay | Admitting: *Deleted

## 2014-04-03 NOTE — Telephone Encounter (Signed)
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 She is asking for refill until she can get in or figure out what to do next since she doesn't know their process. She is using med very sparingly-basically only at night. She still has 2 pills left of the last Rx, but didn't want to run out.

## 2014-04-04 MED ORDER — HYDROCODONE-ACETAMINOPHEN 5-325 MG PO TABS: 1.0000 | ORAL_TABLET | Freq: Two times a day (BID) | ORAL | Status: DC | PRN

## 2014-04-04 NOTE — Telephone Encounter (Signed)
Printed and placed in Kim's box. 

## 2014-04-04 NOTE — Telephone Encounter (Signed)
Patient notified and asked if I would meet her this weekend to give her the Rx. She cannot get to the office before closing due to work. I advised if we were unable to meet this weekend, I would bring Rx back to the office. She verbalized understanding.

## 2014-04-11 ENCOUNTER — Encounter: Payer: Self-pay | Admitting: Family Medicine

## 2014-04-14 ENCOUNTER — Other Ambulatory Visit: Payer: Self-pay | Admitting: Family Medicine

## 2014-04-14 NOTE — Telephone Encounter (Signed)
plz see message from patient, can we try and get her back on the [employer] lantus patient assistance program? Thanks.

## 2014-04-15 ENCOUNTER — Telehealth: Payer: Self-pay | Admitting: *Deleted

## 2014-04-15 ENCOUNTER — Other Ambulatory Visit: Payer: Self-pay | Admitting: *Deleted

## 2014-04-15 MED ORDER — GABAPENTIN 100 MG PO CAPS: 100.0000 mg | ORAL_CAPSULE | Freq: Every day | ORAL | Status: DC

## 2014-04-15 MED ORDER — LISINOPRIL 2.5 MG PO TABS: 2.5000 mg | ORAL_TABLET | Freq: Every day | ORAL | Status: DC

## 2014-04-15 NOTE — Telephone Encounter (Signed)
Form for Lant[employer] patient assistance in your IN box for completion. Please return to me when completed.

## 2014-04-18 NOTE — Telephone Encounter (Signed)
Message left notifying patient that paperwork was completed and mailed to her.

## 2014-04-18 NOTE — Telephone Encounter (Signed)
in Kim's box.

## 2014-04-24 ENCOUNTER — Encounter (HOSPITAL_COMMUNITY): Payer: Self-pay | Admitting: Interventional Cardiology

## 2014-04-28 ENCOUNTER — Emergency Department (HOSPITAL_COMMUNITY)
Admission: EM | Admit: 2014-04-28 | Discharge: 2014-04-28 | Disposition: A | Discharge status: Home / Self Care | Payer: No Typology Code available for payment source | Attending: Emergency Medicine | Admitting: Emergency Medicine

## 2014-04-28 ENCOUNTER — Encounter (HOSPITAL_COMMUNITY): Payer: Self-pay | Admitting: Emergency Medicine

## 2014-04-28 DIAGNOSIS — K006 Disturbances in tooth eruption: Secondary | ICD-10-CM | POA: Insufficient documentation

## 2014-04-28 DIAGNOSIS — Z9889 Other specified postprocedural states: Secondary | ICD-10-CM | POA: Insufficient documentation

## 2014-04-28 DIAGNOSIS — K029 Dental caries, unspecified: Secondary | ICD-10-CM | POA: Insufficient documentation

## 2014-04-28 DIAGNOSIS — Z8742 Personal history of other diseases of the female genital tract: Secondary | ICD-10-CM | POA: Insufficient documentation

## 2014-04-28 DIAGNOSIS — Z7982 Long term (current) use of aspirin: Secondary | ICD-10-CM | POA: Insufficient documentation

## 2014-04-28 DIAGNOSIS — Z7902 Long term (current) use of antithrombotics/antiplatelets: Secondary | ICD-10-CM | POA: Insufficient documentation

## 2014-04-28 DIAGNOSIS — Z880 Allergy status to penicillin: Secondary | ICD-10-CM | POA: Insufficient documentation

## 2014-04-28 DIAGNOSIS — Z9861 Coronary angioplasty status: Secondary | ICD-10-CM | POA: Insufficient documentation

## 2014-04-28 DIAGNOSIS — K0889 Other specified disorders of teeth and supporting structures: Secondary | ICD-10-CM

## 2014-04-28 DIAGNOSIS — Z79899 Other long term (current) drug therapy: Secondary | ICD-10-CM | POA: Insufficient documentation

## 2014-04-28 DIAGNOSIS — K088 Other specified disorders of teeth and supporting structures: Secondary | ICD-10-CM | POA: Insufficient documentation

## 2014-04-28 DIAGNOSIS — Z8709 Personal history of other diseases of the respiratory system: Secondary | ICD-10-CM | POA: Insufficient documentation

## 2014-04-28 DIAGNOSIS — G40909 Epilepsy, unspecified, not intractable, without status epilepticus: Secondary | ICD-10-CM | POA: Insufficient documentation

## 2014-04-28 DIAGNOSIS — Z8619 Personal history of other infectious and parasitic diseases: Secondary | ICD-10-CM | POA: Insufficient documentation

## 2014-04-28 DIAGNOSIS — K219 Gastro-esophageal reflux disease without esophagitis: Secondary | ICD-10-CM | POA: Insufficient documentation

## 2014-04-28 DIAGNOSIS — E785 Hyperlipidemia, unspecified: Secondary | ICD-10-CM | POA: Insufficient documentation

## 2014-04-28 DIAGNOSIS — F329 Major depressive disorder, single episode, unspecified: Secondary | ICD-10-CM | POA: Insufficient documentation

## 2014-04-28 DIAGNOSIS — I2510 Atherosclerotic heart disease of native coronary artery without angina pectoris: Secondary | ICD-10-CM | POA: Insufficient documentation

## 2014-04-28 DIAGNOSIS — Z794 Long term (current) use of insulin: Secondary | ICD-10-CM | POA: Insufficient documentation

## 2014-04-28 DIAGNOSIS — E119 Type 2 diabetes mellitus without complications: Secondary | ICD-10-CM | POA: Insufficient documentation

## 2014-04-28 MED ORDER — HYDROCODONE-ACETAMINOPHEN 5-325 MG PO TABS: 1.0000 | ORAL_TABLET | Freq: Four times a day (QID) | ORAL | Status: DC | PRN

## 2014-04-28 NOTE — ED Notes (Signed)
Patient states R lower dental pain x 2 -3 weeks.  Patient states is on the waiting list for [facility] reduced rate services, but hasn't heard back from them.   Patient states had been taking hydrocodone 5-325 at home, but has run out at this time.  Patient did drive today.

## 2014-04-28 NOTE — ED Provider Notes (Signed)
CSN: [id number]     Arrival date & time 04/28/14  7564 History   First MD Initiated Contact with Patient 04/28/14 0801     Chief Complaint  Patient presents with  . Dental Pain     (Consider location/radiation/quality/duration/timing/severity/associated sxs/prior Treatment) HPI Karen [name] is a 58 year old female with past medical history of diabetes, GERD, bronchitis, hypertension, hyperlipidemia who presents to the ER complaining of dental pain. Patient states her pain began gradually over the past 2-3 weeks. Patient reports she is on a waiting list for a free clinic with the [employer] dental school, but has not been able to follow-up with them yet. Patient states she is out of her pain medication hydrocodone 5/325 prescribed by her primary care physician. Patient denies fever, swelling of her gums, dysphagia, sore throat, neck pain, nausea, vomiting.  Past Medical History  Diagnosis Date  . History of chicken pox   . Depression   . Type 2 diabetes, uncontrolled, with neuropathy     dx 1993  . Chronic bronchitis   . Syncopal episodes   . GERD (gastroesophageal reflux disease)   . Seasonal allergies   . High blood pressure   . HLD (hyperlipidemia)   . History of ovarian cyst   . Tubal pregnancy   . Tachyarrhythmia     ER visits with adenosine push to control  . CAD (coronary artery disease)     STEMI 07/2013 - treated with PTCA of the RCA followed by DES to the intermediate ramus  . Seizure 1977    off AEDs   Past Surgical History  Procedure Laterality Date  . Tonsillectomy and adenoidectomy  1965  . Ovarian cyst surgery  1998  . Dilation and curettage of uterus  1984  . Ectopic pregnancy surgery Right 1984    tube removal 2/2 hemorrhage  . Rotator cuff repair  2006    Right  . Cardiac catheterization  07/2013    angioplasty to RCA and DES to ramus intermedius  . Left heart catheterization with coronary angiogram N/A 07/23/2013    Procedure: LEFT HEART CATHETERIZATION  WITH CORONARY ANGIOGRAM;  Surgeon: Sinclair Grooms, MD;  Location: [facility] CATH LAB;  Service: Cardiovascular;  Laterality: N/A;  . Percutaneous coronary stent intervention (pci-s) N/A 07/24/2013    Procedure: PERCUTANEOUS CORONARY STENT INTERVENTION (PCI-S);  Surgeon: Sinclair Grooms, MD;  Location: [facility] CATH LAB;  Service: Cardiovascular;  Laterality: N/A;   Family History  Problem Relation Age of Onset  . Diabetes Mother   . Hyperlipidemia Mother   . Hypertension Mother   . Stroke Mother   . Coronary artery disease Mother     CABG  . Heart disease Mother   . Diabetes Father   . Hypertension Father   . Hyperlipidemia Father   . Coronary artery disease Father     CABG  . Heart disease Father     CHF  . Pancreatic cancer Father   . Heart disease Brother     arrhythmia  . Diabetes Maternal Grandmother   . Diabetes Maternal Grandfather   . Diabetes Paternal Grandmother   . Diabetes Paternal Grandfather   . Kidney disease Neg Hx    History  Substance Use Topics  . Smoking status: Never Smoker   . Smokeless tobacco: Never Used  . Alcohol Use: No   OB History    No data available     Review of Systems  Constitutional: Negative for fever and chills.  HENT: Positive for  dental problem.   Skin: Negative for rash and wound.      Allergies  Erythromycin; Lipitor; and Penicillins  Home Medications   Prior to Admission medications   Medication Sig Start Date End Date Taking? Authorizing Provider  acetaminophen (TYLENOL) 500 MG tablet Take 1,000 mg by mouth every 6 (six) hours as needed.    Historical Provider, MD  antipyrine-benzocaine Toniann Fail) otic solution Place 3-4 drops into the right ear every 2 (two) hours as needed for ear pain. 12/14/13   Audelia Hives Presson, PA  aspirin EC 81 MG EC tablet Take 1 tablet (81 mg total) by mouth daily. 07/25/13   Sueanne Margarita, MD  cephALEXin (KEFLEX) 500 MG capsule Take 1 capsule (500 mg total) by mouth 4 (four) times daily. 03/17/14    Antonietta Breach, PA-C  citalopram (CELEXA) 20 MG tablet Take 1 tablet (20 mg total) by mouth daily. 09/10/13   [name], MD  citalopram (CELEXA) 20 MG tablet TAKE 1 TABLET BY MOUTH DAILY 04/14/14   [name], MD  clopidogrel (PLAVIX) 75 MG tablet Take 1 tablet (75 mg total) by mouth daily. 08/09/13   Burtis Junes, NP  gabapentin (NEURONTIN) 100 MG capsule Take 1 capsule (100 mg total) by mouth at bedtime. 04/15/14   [name], MD  HYDROcodone-acetaminophen (NORCO/VICODIN) 5-325 MG per tablet Take 1-2 tablets by mouth every 6 (six) hours as needed for severe pain. 04/28/14   Carrie Mew, PA-C  insulin regular (NOVOLIN R,HUMULIN R) 100 units/mL injection Inject 10 Units into the skin daily before lunch.    Historical Provider, MD  LEVEMIR 100 UNIT/ML injection INJECT 0.6 MLS (60 UNITS TOTAL) INTO THE SKIN 2 (TWO) TIMES DAILY. 02/03/14   [name], MD  lisinopril (ZESTRIL) 2.5 MG tablet Take 1 tablet (2.5 mg total) by mouth daily. 04/15/14   [name], MD  metFORMIN (GLUCOPHAGE) 1000 MG tablet TAKE 1 TABLET BY MOUTH TWICE DAILY WITH MEALS    [name], MD  metoprolol tartrate (LOPRESSOR) 25 MG tablet Take 12.5 mg by mouth 2 (two) times daily. 12/19/13   Historical Provider, MD  nitroGLYCERIN (NITROSTAT) 0.4 MG SL tablet Place 1 tablet (0.4 mg total) under the tongue every 5 (five) minutes as needed for chest pain. 07/25/13   Brittainy Erie Noe, PA-C  pantoprazole (PROTONIX) 40 MG tablet Take 1 tablet (40 mg total) by mouth daily. 02/12/14   [name], MD  promethazine (PHENERGAN) 25 MG tablet Take 1 tablet (25 mg total) by mouth every 6 (six) hours as needed for nausea or vomiting. 12/07/13   Waldemar Dickens, MD  Simethicone (GAS-X PO) Take 2 tablets by mouth 4 (four) times daily as needed (gas).    Historical Provider, MD  simvastatin (ZOCOR) 40 MG tablet Take 1 tablet (40 mg total) by mouth daily. 09/10/13   [name], MD  simvastatin (ZOCOR) 40 MG  tablet TAKE 1 TABLET (40 MG TOTAL) BY MOUTH DAILY. 03/31/14   [name], MD   BP 167/59 mmHg  Pulse 65  Temp(Src) 98.3 F (36.8 C) (Oral)  Resp 18  SpO2 94% Physical Exam  Constitutional: She is oriented to person, place, and time. She appears well-developed and well-nourished. No distress.  HENT:  Head: Normocephalic and atraumatic.  Mouth/Throat: Uvula is midline and mucous membranes are normal. No trismus in the jaw. Abnormal dentition. Dental caries present. No dental abscesses or uvula swelling. No oropharyngeal exudate, posterior oropharyngeal edema, posterior oropharyngeal erythema or tonsillar abscesses.  Patient  has poor dentition with multiple dental caries. Mild tenderness to palpation around the gumline of right lower premolar. No gross abscess, floor of mouth is soft and palpable on both sides of gums. No induration, swelling, signs of cellulitis. Mild anterior cervical lymphadenopathy. No signs of trismus, PTA.  Eyes: Right eye exhibits no discharge. Left eye exhibits no discharge. No scleral icterus.  Neck: Normal range of motion.  Pulmonary/Chest: Effort normal. No respiratory distress.  Musculoskeletal: Normal range of motion.  Neurological: She is alert and oriented to person, place, and time.  Skin: Skin is warm and dry. She is not diaphoretic.  Psychiatric: She has a normal mood and affect.  Nursing note and vitals reviewed.   ED Course  Procedures (including critical care time) Labs Review Labs Reviewed - No data to display  Imaging Review No results found.   EKG Interpretation None      MDM   Final diagnoses:  Pain, dental    Patient with toothache.  No gross abscess.  Exam unconcerning for Ludwig's angina or spread of infection.  Will treat with pain medicine.  I recommended antibiotic therapy to patient's, patient states she has an allergy to penicillin with a severe rash. She states she is unable to take clindamycin due to severe nausea,  vomiting. Spoke with a pharmacist about this, and he recommended trying the clindamycin with patient if patient's rash was severe from the penicillin. After discussing this with patient, and recommended she try the clindamycin with food to reduce the side effect of stomach upset, patient adamantly refused the clindamycin therapy. I strongly recommended this therapy to her despite GI upset, stated this would be the appropriate therapy for her, and it would be no other appropriate therapy based on my recommendation and the pharmacist recommendations. Patient verbalizes understanding of this, and was adamant that she did not receive clindamycin or erythromycin. I discussed strict return precautions with patient regarding the spread of infection or worsening of her symptoms and patient was agreeable to this plan. I encouraged patient to follow-up with dentistry, and return to ER should her symptoms change, worsen or should she have any questions or concerns.     BP 167/59 mmHg  Pulse 65  Temp(Src) 98.3 F (36.8 C) (Oral)  Resp 18  SpO2 94%  Signed,  Dahlia Bailiff, PA-C 9:19 AM    Carrie Mew, PA-C 04/28/14 Kirkman Fleurette Woolbright, PA-C 04/28/14 [id number]  Johnna Acosta, MD 04/29/14 [phone]

## 2014-04-28 NOTE — Discharge Instructions (Signed)
Follow-up with the [facility] dentistry clinic. You're not prescribed antibiotics today due to severe rash with penicillins, and severe nausea and vomiting with clindamycin. Return to the ER with any severe swelling, redness, gum swelling, severe pain, difficulty swallowing, high fever, nausea, vomiting.  Dental Pain A tooth ache may be caused by cavities (tooth decay). Cavities expose the nerve of the tooth to air and hot or cold temperatures. It may come from an infection or abscess (also called a boil or furuncle) around your tooth. It is also often caused by dental caries (tooth decay). This causes the pain you are having. DIAGNOSIS  Your caregiver can diagnose this problem by exam. TREATMENT   If caused by an infection, it may be treated with medications which kill germs (antibiotics) and pain medications as prescribed by your caregiver. Take medications as directed.  Only take over-the-counter or prescription medicines for pain, discomfort, or fever as directed by your caregiver.  Whether the tooth ache today is caused by infection or dental disease, you should see your dentist as soon as possible for further care. SEEK MEDICAL CARE IF: The exam and treatment you received today has been provided on an emergency basis only. This is not a substitute for complete medical or dental care. If your problem worsens or new problems (symptoms) appear, and you are unable to meet with your dentist, call or return to this location. SEEK IMMEDIATE MEDICAL CARE IF:   You have a fever.  You develop redness and swelling of your face, jaw, or neck.  You are unable to open your mouth.  You have severe pain uncontrolled by pain medicine. MAKE SURE YOU:   Understand these instructions.  Will watch your condition.  Will get help right away if you are not doing well or get worse. Document Released: 05/02/2005 Document Revised: 07/25/2011 Document Reviewed: 12/19/2007 [facility] Patient Information 2015  [place], [place]. This information is not intended to replace advice given to you by your health care provider. Make sure you discuss any questions you have with your health care provider.

## 2014-05-01 ENCOUNTER — Telehealth: Payer: Self-pay | Admitting: Family Medicine

## 2014-05-01 NOTE — Telephone Encounter (Signed)
We have left multiple phone calls to get this patient scheduled for the MRI Wrist and she has not returned any of our calls. Please advise as we cant cancel your order in Epic if this is what you are wanting done. Please cancel your order if you want to have it cancelled.

## 2014-05-02 NOTE — Addendum Note (Signed)
Addended by: Carter Kitten on: 05/02/2014 04:47 PM   Modules accepted: Orders

## 2014-05-02 NOTE — Telephone Encounter (Signed)
This is up to the patient's discretion regarding her own healthcare. In this case, without any response, cancelling planned test would seem to follow her wishes.

## 2014-05-05 ENCOUNTER — Encounter: Payer: Self-pay | Admitting: Family Medicine

## 2014-05-06 MED ORDER — HYDROCODONE-ACETAMINOPHEN 5-325 MG PO TABS: 1.0000 | ORAL_TABLET | Freq: Four times a day (QID) | ORAL | Status: DC | PRN

## 2014-05-06 NOTE — Telephone Encounter (Signed)
Patient notified via Mychart and Rx placed up front for pick up.

## 2014-05-06 NOTE — Telephone Encounter (Signed)
Noted. Printed. Thanks.

## 2014-05-06 NOTE — Telephone Encounter (Signed)
Ok to refill in Dr. Synthia Innocent absence? Can't get into [facility] until after Jan 1st. See Mychart message.

## 2014-05-14 ENCOUNTER — Ambulatory Visit (INDEPENDENT_AMBULATORY_CARE_PROVIDER_SITE_OTHER): Payer: Self-pay | Admitting: Family Medicine

## 2014-05-14 ENCOUNTER — Telehealth: Payer: Self-pay | Admitting: Family Medicine

## 2014-05-14 ENCOUNTER — Encounter: Payer: Self-pay | Admitting: Family Medicine

## 2014-05-14 VITALS — BP 136/68 | HR 72 | Temp 98.2°F | Wt 182.8 lb

## 2014-05-14 DIAGNOSIS — K089 Disorder of teeth and supporting structures, unspecified: Secondary | ICD-10-CM | POA: Insufficient documentation

## 2014-05-14 DIAGNOSIS — K0889 Other specified disorders of teeth and supporting structures: Secondary | ICD-10-CM | POA: Insufficient documentation

## 2014-05-14 DIAGNOSIS — E1140 Type 2 diabetes mellitus with diabetic neuropathy, unspecified: Secondary | ICD-10-CM

## 2014-05-14 DIAGNOSIS — M1812 Unilateral primary osteoarthritis of first carpometacarpal joint, left hand: Secondary | ICD-10-CM

## 2014-05-14 DIAGNOSIS — IMO0002 Reserved for concepts with insufficient information to code with codable children: Secondary | ICD-10-CM

## 2014-05-14 DIAGNOSIS — K088 Other specified disorders of teeth and supporting structures: Secondary | ICD-10-CM

## 2014-05-14 DIAGNOSIS — E1165 Type 2 diabetes mellitus with hyperglycemia: Secondary | ICD-10-CM

## 2014-05-14 MED ORDER — HYDROCODONE-ACETAMINOPHEN 10-325 MG PO TABS: 1.0000 | ORAL_TABLET | Freq: Three times a day (TID) | ORAL | Status: DC | PRN

## 2014-05-14 MED ORDER — NAPROXEN 500 MG PO TABS: ORAL_TABLET | ORAL | Status: DC

## 2014-05-14 NOTE — Assessment & Plan Note (Signed)
Pt declines MRI for now - may consider in new year. Will call to inquire pricing in 2016.

## 2014-05-14 NOTE — Assessment & Plan Note (Signed)
Anticipate acute pulpitis as acute cause of pain. Gingivitis, caries also present.  Will prescribe short 5d naprosyn course and will provide with #20 hydrocodone APAP 10/325mg  tablets. Stressed importance of establishing with a dental clinic - pt states she will call at new year to schedule apt with [facility] dental clinic. Red flags to seek urgent care or f/u discussed. Pt continues to adamantly decline clindamycin course, and has PCN allergy.  Consider keflex if worsening.

## 2014-05-14 NOTE — Telephone Encounter (Signed)
Patient notified and scheduled for 1:30.

## 2014-05-14 NOTE — Telephone Encounter (Signed)
plz schedule appt today at 1:30 or 4pm to evaluate.

## 2014-05-14 NOTE — Telephone Encounter (Signed)
Spoke with patient. She is having now to take 1-2 vicodin Q6 hours and it is barely touching the pain. She said she can't go to [facility] until school starts back because of the holiday break. She isn't sure if the weather changes are causing it to hurt worse or if it's possibly re-infected. She is asking for something stronger.

## 2014-05-14 NOTE — Patient Instructions (Addendum)
Short course of naprosyn with food for 5 days to help dental pain. Hydrocodone 10mg  provided today. Please schedule appointment with dental clinic. Please watch for worsening pain or fever and let us know if this happens or seek urgent care.

## 2014-05-14 NOTE — Telephone Encounter (Signed)
Message left for patient to return my call.  

## 2014-05-14 NOTE — Telephone Encounter (Signed)
Pt called and request call back about tooth pain.

## 2014-05-14 NOTE — Assessment & Plan Note (Addendum)
meds reviewed - now on novolin 70/30 60u bid and metformin 1000mg  bid. Endorses persistently high sugars.

## 2014-05-14 NOTE — Progress Notes (Signed)
BP 136/68 mmHg  Pulse 72  Temp(Src) 98.2 F (36.8 C) (Oral)  Wt 182 lb 12.8 oz (82.918 kg)   CC: tooth ache  Subjective:    Patient ID: Karen [name], female    DOB: [birth year], 58 y.o.   MRN: [id number]  HPI: Karen [name] is a 58 y.o. female presenting on 05/14/2014 for Dental Pain   H/o recurrent dental pain/infection in past. Has not been able to get in with dental school for dental work - until new year. Last seen 04/28/2014 at ER with dental pain - treated with pain regimen, pt declined abx therapy 2/2 GI upset although clindamycin was recommended. Last time here we prescribed keflex which helped.  Has been seen at ER 04/2014, 03/2014 for same concerns. We have refilled hydrocodone multiple times in last several months for same reason.   Planning on seeing [facility] dental clinic. R entire face hurts, worse this past week. Denies fevers/chills, swelling, redness or warmth of face. Has been taking 2 hydrocodone which isn't helping pain. Unable to take NSAIDs 2/2 plavix use.  Chronically uncontrolled diabetic. Currently on metformin 1000mg  bid, and novolin 70/30 60u bid. Prior on levemir 60u bid but has been unable to fill again. Lab Results  Component Value Date   HGBA1C 11.2* 02/21/2014   L wrist MRI was recommended previously - pt wants to wait until first of year and then will decide. L wrist continues to bother her.  Relevant past medical, surgical, family and social history reviewed and updated as indicated. Interim medical history since our last visit reviewed. Allergies and medications reviewed and updated.  Current Outpatient Prescriptions on File Prior to Visit  Medication Sig  . acetaminophen (TYLENOL) 500 MG tablet Take 1,000 mg by mouth every 6 (six) hours as needed.  Marland Kitchen antipyrine-benzocaine (AURALGAN) otic solution Place 3-4 drops into the right ear every 2 (two) hours as needed for ear pain.  Marland Kitchen aspirin EC 81 MG EC tablet Take 1 tablet (81 mg total) by mouth  daily.  . citalopram (CELEXA) 20 MG tablet TAKE 1 TABLET BY MOUTH DAILY  . clopidogrel (PLAVIX) 75 MG tablet Take 1 tablet (75 mg total) by mouth daily.  Marland Kitchen gabapentin (NEURONTIN) 100 MG capsule Take 1 capsule (100 mg total) by mouth at bedtime.  Marland Kitchen lisinopril (ZESTRIL) 2.5 MG tablet Take 1 tablet (2.5 mg total) by mouth daily.  . metFORMIN (GLUCOPHAGE) 1000 MG tablet TAKE 1 TABLET BY MOUTH TWICE DAILY WITH MEALS  . metoprolol tartrate (LOPRESSOR) 25 MG tablet Take 12.5 mg by mouth 2 (two) times daily.  . nitroGLYCERIN (NITROSTAT) 0.4 MG SL tablet Place 1 tablet (0.4 mg total) under the tongue every 5 (five) minutes as needed for chest pain.  . pantoprazole (PROTONIX) 40 MG tablet Take 1 tablet (40 mg total) by mouth daily.  . Simethicone (GAS-X PO) Take 2 tablets by mouth 4 (four) times daily as needed (gas).  . simvastatin (ZOCOR) 40 MG tablet TAKE 1 TABLET (40 MG TOTAL) BY MOUTH DAILY.  Marland Kitchen promethazine (PHENERGAN) 25 MG tablet Take 1 tablet (25 mg total) by mouth every 6 (six) hours as needed for nausea or vomiting.   No current facility-administered medications on file prior to visit.    Review of Systems Per HPI unless specifically indicated above     Objective:    BP 136/68 mmHg  Pulse 72  Temp(Src) 98.2 F (36.8 C) (Oral)  Wt 182 lb 12.8 oz (82.918 kg)  Wt Readings from  Last 3 Encounters:  05/14/14 182 lb 12.8 oz (82.918 kg)  03/27/14 184 lb 12 oz (83.802 kg)  02/27/14 178 lb 8 oz (80.967 kg)    Physical Exam  Constitutional: She appears well-developed and well-nourished. No distress.  HENT:  Mouth/Throat: Uvula is midline, oropharynx is clear and moist and mucous membranes are normal. Abnormal dentition. Dental caries present. No dental abscesses. No oropharyngeal exudate.  Markedly poor dentition with pulp of several teeth exposed. Marked point tenderness to palpation of R lower premolar as well as radiation of pain to jaw. No evidence of dental abscess today    Lymphadenopathy:       Head (right side): No submental, no submandibular, no tonsillar, no preauricular and no posterior auricular adenopathy present.       Head (left side): No submental, no submandibular, no tonsillar, no preauricular and no posterior auricular adenopathy present.    She has no cervical adenopathy.  Nursing note and vitals reviewed.      Assessment & Plan:   Problem List Items Addressed This Visit    Type 2 diabetes, uncontrolled, with neuropathy    meds reviewed - now on novolin 70/30 60u bid and metformin 1000mg  bid. Endorses persistently high sugars.    Relevant Medications      insulin NPH-regular Human (NOVOLIN 70/30) (70-30) 100 UNIT/ML injection   Pain, dental - Primary    Anticipate acute pulpitis as acute cause of pain. Gingivitis, caries also present.  Will prescribe short 5d naprosyn course and will provide with #20 hydrocodone APAP 10/325mg  tablets. Stressed importance of establishing with a dental clinic - pt states she will call at new year to schedule apt with [facility] dental clinic. Red flags to seek urgent care or f/u discussed. Pt continues to adamantly decline clindamycin course, and has PCN allergy.  Consider keflex if worsening.    CMC arthritis, thumb, degenerative    Pt declines MRI for now - may consider in new year. Will call to inquire pricing in 2016.    Relevant Medications      naproxen (NAPROSYN) tablet      HYDROCODONE-ACETAMINOPHEN 10-325 MG PO TABS       Follow up plan: Return if symptoms worsen or fail to improve.

## 2014-05-15 ENCOUNTER — Telehealth: Payer: Self-pay

## 2014-05-15 ENCOUNTER — Ambulatory Visit: Payer: No Typology Code available for payment source | Admitting: Family Medicine

## 2014-05-15 MED ORDER — CEPHALEXIN 500 MG PO CAPS: 500.0000 mg | ORAL_CAPSULE | Freq: Three times a day (TID) | ORAL | Status: DC

## 2014-05-15 NOTE — Telephone Encounter (Signed)
PLEASE NOTE: All timestamps contained within this report are represented as [place] Standard Time. CONFIDENTIALTY NOTICE: This fax transmission is intended only for the addressee. It contains information that is legally privileged, confidential or otherwise protected from use or disclosure. If you are not the intended recipient, you are strictly prohibited from reviewing, disclosing, copying using or disseminating any of this information or taking any action in reliance on or regarding this information. If you have received this fax in error, please notify us immediately by telephone so that we can arrange for its return to [place]. Phone: [phone], Toll-Free: [phone], Fax: [phone] Page: 1 of 2 Call Id: [id number] [place] Patient Name: Karen [name] Gender: Female DOB: [birth year] Age: 3 Y 26 M 21 D Return Phone Number: [phone] (Primary) Address: City/State/Zip: [place] Client [place] [facility] - Client Client Site [place], [place] Type Call Call Type Triage / Clinical Caller Name Laiana Relationship To Patient Self Return Phone Number [phone] (Primary) Chief Complaint Facial Swelling Initial Comment Caller states she was seen yesterday- tooth. He gave her pain medication, and Naproxen. Her face is swelled- PreDisposition Go to ED Nurse Assessment Nurse: Margaretmary Dys, RN, Hettie Date/Time (Eastern Time): 05/15/2014 12:21:07 PM Confirm and document reason for call. If symptomatic, describe symptoms. ---Caller states she was seen yesterday- tooth. He gave her pain medication, and Naproxen. Her face is swollen, which is worse today. Tooth needs to be extracted. Pt thinks it is infected. Pt is in pain. Pain med does not last duration. On Norco. And Naproxen. Has the patient traveled out of the country within  the last 30 days? ---Not Applicable Does the patient require triage? ---Yes Related visit to physician within the last 2 weeks? ---Yes Does the PT have any chronic conditions? (i.e. diabetes, asthma, etc.) ---Yes List chronic conditions. ---Stent placed this yr, diabetes. Guidelines Guideline Title Affirmed Question Affirmed Notes Nurse Date/Time Eilene Ghazi Time) Toothache Face is very swollen Brewner, RN, Hettie 05/15/2014 12:24:55 PM Disp. Time Eilene Ghazi Time) Disposition Final User 05/15/2014 12:32:41 PM See Physician within 4 Hours (or PCP triage) Yes Brewner, RN, Hettie Caller Understands: Yes Disagree/Comply: Comply PLEASE NOTE: All timestamps contained within this report are represented as [place] Standard Time. CONFIDENTIALTY NOTICE: This fax transmission is intended only for the addressee. It contains information that is legally privileged, confidential or otherwise protected from use or disclosure. If you are not the intended recipient, you are strictly prohibited from reviewing, disclosing, copying using or disseminating any of this information or taking any action in reliance on or regarding this information. If you have received this fax in error, please notify us immediately by telephone so that we can arrange for its return to [place]. Phone: [phone], Toll-Free: [phone], Fax: [phone] Page: 2 of 2 Call Id: [id number] Care Advice Given Per Guideline SEE PHYSICIAN WITHIN 4 HOURS (or PCP triage): * IF NO PCP TRIAGE: You need to be seen. Go to _______________ (ED/[facility] or office if it will be open) within the next 3 or 4 hours. Go sooner if you become worse. ALTERNATE DISPOSITION - DENTIST: A dentist can handle this type of problem. IF the patient has a Administrator, sports, then the patient could instead attempt to call or see their dentist in the next couple hours. LOCAL COLD: Apply a cold pack or ice in a wet washcloth to the painful area of the face for  20 minutes. CALL  BACK IF: * You become worse. CARE ADVICE given per Toothache (Adult) guideline. After Care Instructions Given Call Event Type User Date / Time Description Comments User: Chalmers Guest, RN Date/Time Eilene Ghazi Time): 05/15/2014 12:24:05 PM High triglycerides and high cholesterol. User: Chalmers Guest, RN Date/Time Eilene Ghazi Time): 05/15/2014 12:28:02 PM Pt asked if Norco and Naproxen can be taken together. According to drugs.com, no interactions between two drugs, however, recommended pt call pharmacist. Pt verbalized an understanding and will comply. User: Chalmers Guest, RN Date/Time Eilene Ghazi Time): 05/15/2014 12:34:27 PM per client directive, 4 hour outcome, need to call for app, however pt did not want the call made. Pt states will try to get a hold of dentist and if unsuccessful, will go to ED. Referrals [facility] - ED

## 2014-05-15 NOTE — Telephone Encounter (Signed)
Patient notified and will start abx immediately.

## 2014-05-15 NOTE — Telephone Encounter (Signed)
May try 5d keflex course - sent to pharmacy.

## 2014-05-19 ENCOUNTER — Telehealth: Payer: Self-pay | Admitting: Interventional Cardiology

## 2014-05-19 ENCOUNTER — Encounter: Payer: Self-pay | Admitting: Family Medicine

## 2014-05-19 ENCOUNTER — Telehealth: Payer: Self-pay | Admitting: Family Medicine

## 2014-05-19 NOTE — Telephone Encounter (Signed)
Pt is on plavix 75 mg and low dose aspirin 81mg . She takes each of these once a day and is needing to have an extraction of a tooth that is broken. She is wanting to know how long does she need to stop taking the plavix and aspirin before she has this procedure done.

## 2014-05-20 ENCOUNTER — Encounter: Payer: Self-pay | Admitting: Family Medicine

## 2014-05-20 NOTE — Telephone Encounter (Signed)
I'd like to get recs from cardiology on holding plavix/aspirin for dental work as it has been <1 yr since her stent. Will route to Dr Tamala Julian. Will refill narcotic in interim.

## 2014-05-20 NOTE — Telephone Encounter (Signed)
New message     Request for surgical clearance:  1. What type of surgery is being performed? Oral surgery  2. When is this surgery scheduled? Not scheduled yet  3. Are there any medications that need to be held prior to surgery and how long?how long should pt be off plavix and aspirin  4. Name of physician performing surgery?Dr Remus Blake  5. What is your office phone and fax number?Office [phone] attn Eustaquio Maize or Joelene Millin

## 2014-05-21 MED ORDER — HYDROCODONE-ACETAMINOPHEN 10-325 MG PO TABS: 1.0000 | ORAL_TABLET | Freq: Three times a day (TID) | ORAL | Status: DC | PRN

## 2014-05-21 NOTE — Telephone Encounter (Signed)
New Message  Pt requests to speak with Rn concerning the status of her dental clearance.  Please call back and discuss.

## 2014-05-21 NOTE — Telephone Encounter (Signed)
Rx placed up front for pick up. 

## 2014-05-21 NOTE — Telephone Encounter (Signed)
Returned call to pt. Adv her a message will be fwd to [employer] for an approval to hold Plavix for her upcoming oral Sx. We will contact her and her dentist with his response. She verbalized understanding.

## 2014-05-21 NOTE — Telephone Encounter (Signed)
Per Mychart-patient is aware. Rx placed upfront for pick up.

## 2014-05-22 NOTE — Telephone Encounter (Signed)
I'd prefer to continue Plavix for 1 year from start which was mid March 2015

## 2014-05-23 NOTE — Telephone Encounter (Signed)
called to give pt Dr.Smith recommendation.lmtcb

## 2014-05-26 ENCOUNTER — Encounter: Payer: Self-pay | Admitting: Interventional Cardiology

## 2014-05-26 ENCOUNTER — Encounter: Payer: Self-pay | Admitting: Family Medicine

## 2014-05-26 ENCOUNTER — Telehealth: Payer: Self-pay | Admitting: Family Medicine

## 2014-05-26 MED ORDER — NAPROXEN 500 MG PO TABS
ORAL_TABLET | ORAL | Status: DC
Start: 2014-05-26 — End: 2014-07-21

## 2014-05-26 NOTE — Telephone Encounter (Signed)
Follow up     Returning a nurses call from last week

## 2014-05-26 NOTE — Telephone Encounter (Signed)
Pt given Dr.Smith's recommendation he would prefer to continue Plavix for 1 year from start which was mid March 2015. Adv her that interruption of anti-platelet puts her at risk for stent clotting. She understands but is in a lot of pain and needs to have her tooth extraction done.adv her I will fwd him a a message and call back with his response. She verbalized understanding.

## 2014-05-26 NOTE — Telephone Encounter (Signed)
Please call patient about a ?tooth infection.  She left a message on my chart this morning.

## 2014-05-26 NOTE — Telephone Encounter (Signed)
Spoke with patient. She says face is swollen again and she believes tooth is re-infected. No fever. Dr. Thompson Caul office called today and said he didn't want her to do anything until March due to her stent. She told them she was in excruciating pain and needed something done ASAP. They said they would talk to him and call her back. In the meantime, she asks for more abx and another short course of naproxen (which you have already sent in).

## 2014-05-27 ENCOUNTER — Encounter: Payer: Self-pay | Admitting: Family Medicine

## 2014-05-27 NOTE — Telephone Encounter (Signed)
Risk of stent clot is less than 2%. If she has to it wouldn't be okay to discontinue the medication for 5 days and then resumed as soon as possible thereafter. This assumes that she is willing to except the risks noted.

## 2014-05-27 NOTE — Telephone Encounter (Signed)
will see tomorrow. 

## 2014-05-27 NOTE — Telephone Encounter (Signed)
Ok for work note? 

## 2014-05-28 ENCOUNTER — Encounter: Payer: Self-pay | Admitting: *Deleted

## 2014-05-28 ENCOUNTER — Ambulatory Visit (INDEPENDENT_AMBULATORY_CARE_PROVIDER_SITE_OTHER): Payer: Self-pay | Admitting: Family Medicine

## 2014-05-28 ENCOUNTER — Encounter: Payer: Self-pay | Admitting: Family Medicine

## 2014-05-28 VITALS — BP 130/70 | HR 72 | Temp 97.8°F | Wt 175.5 lb

## 2014-05-28 DIAGNOSIS — IMO0002 Reserved for concepts with insufficient information to code with codable children: Secondary | ICD-10-CM

## 2014-05-28 DIAGNOSIS — K088 Other specified disorders of teeth and supporting structures: Secondary | ICD-10-CM

## 2014-05-28 DIAGNOSIS — K0889 Other specified disorders of teeth and supporting structures: Secondary | ICD-10-CM

## 2014-05-28 DIAGNOSIS — E1140 Type 2 diabetes mellitus with diabetic neuropathy, unspecified: Secondary | ICD-10-CM

## 2014-05-28 DIAGNOSIS — E1165 Type 2 diabetes mellitus with hyperglycemia: Secondary | ICD-10-CM

## 2014-05-28 NOTE — Telephone Encounter (Signed)
Called to give pt Dr.Smith's response.lmtcb 

## 2014-05-28 NOTE — Progress Notes (Signed)
Pre visit review using our clinic review tool, if applicable. No additional management support is needed unless otherwise documented below in the visit note. 

## 2014-05-28 NOTE — Patient Instructions (Addendum)
We will fax note to work - return to work on Friday ([employer] fax [phone]).  Please call dentist to schedule appointment today or tomorrow. We will fax note to them today. Ok to hold plavix for 5 days prior to surgery, then restart as soon as possible.

## 2014-05-28 NOTE — Assessment & Plan Note (Addendum)
Anticipate acute pulpitis R lower molar + gingivitis/cavities. No obvious abscess today so did not prescribe abx - but she has had several courses of abx in last few months, and this issue has led to significant impairment - has missed multiple days from work 2/2 dental pain. Pt in process of establishing with dental clinic. Will fax today's note attn Dr. Raliegh Ip. Per cardiology, ok to hold plavix 5d prior to any necessary intervention as long as patient aware of small stent clotting risk with stopping plavix prior to 1 year out from stent (pt aware and wishes to proceed). I would continue aspirin perioperatively if dentist is comfortable with this. Appreciate dental care of patient.

## 2014-05-28 NOTE — Assessment & Plan Note (Addendum)
Chronically uncontrolled.  Discussed diet choices and need for better control esp in setting of possible dental work. Has had trouble affording insulin recently, but now back on novolog 70/30 60u bid, and metformin 1000mg  bid.  Lab Results  Component Value Date   HGBA1C 11.2* 02/21/2014

## 2014-05-28 NOTE — Progress Notes (Signed)
BP 130/70 mmHg  Pulse 72  Temp(Src) 97.8 F (36.6 C) (Oral)  Wt 175 lb 8 oz (79.606 kg)   CC: dental pain  Subjective:    Patient ID: Karen [name], female    DOB: [birth year], 59 y.o.   MRN: [id number]  HPI: Karen [name] is a 59 y.o. female presenting on 05/28/2014 for Dental Pain   See recent office and phone notes for details.  H/o recurrent dental pain/infection in past. H/o uncontrolled diabetes and hyperlipidemia. Last seen 04/28/2014 at ER with dental pain - treated with pain regimen, clindamycin was recommended and pt declined abx therapy 2/2 GI upset. Has had 2 courses of keflex, latest 5d course of 500mg  TID on 05/15/2014  Has been seen at ER 03/2014, 04/2014 for same concerns. We have refilled hydrocodone multiple times in last several months for same reason.   To see dentist Dr Noah Charon. Scheduling appointment. R entire face hurts, worsened this past week. Denies fevers/chills, swelling, redness or warmth of face.  Out of work since Monday 2/2 persistent tooth pain. To return to work Friday. Requests work note. We prescribed naprosyn but she has not filled yet - will fill today.  She had stent placed 07/2013. Cardiology recommended keeping aspirin and plavix on board for a full year. If deemed necessary, patient could hold plavix for 5 days but bearing in mind 2% stent clotting risk. Pt aware of risk and desires to proceed with tooth extraction.  Relevant past medical, surgical, family and social history reviewed and updated as indicated. Interim medical history since our last visit reviewed. Allergies and medications reviewed and updated. Current Outpatient Prescriptions on File Prior to Visit  Medication Sig  . acetaminophen (TYLENOL) 500 MG tablet Take 1,000 mg by mouth every 6 (six) hours as needed.  Marland Kitchen antipyrine-benzocaine (AURALGAN) otic solution Place 3-4 drops into the right ear every 2 (two) hours as needed for ear pain.  Marland Kitchen aspirin EC 81 MG EC tablet Take 1  tablet (81 mg total) by mouth daily.  . citalopram (CELEXA) 20 MG tablet TAKE 1 TABLET BY MOUTH DAILY  . clopidogrel (PLAVIX) 75 MG tablet Take 1 tablet (75 mg total) by mouth daily.  Marland Kitchen gabapentin (NEURONTIN) 100 MG capsule Take 1 capsule (100 mg total) by mouth at bedtime.  Marland Kitchen HYDROcodone-acetaminophen (NORCO) 10-325 MG per tablet Take 1 tablet by mouth every 8 (eight) hours as needed.  . insulin NPH-regular Human (NOVOLIN 70/30) (70-30) 100 UNIT/ML injection Inject 60 Units into the skin 2 (two) times daily with a meal.  . lisinopril (ZESTRIL) 2.5 MG tablet Take 1 tablet (2.5 mg total) by mouth daily.  . metFORMIN (GLUCOPHAGE) 1000 MG tablet TAKE 1 TABLET BY MOUTH TWICE DAILY WITH MEALS  . metoprolol tartrate (LOPRESSOR) 25 MG tablet Take 12.5 mg by mouth 2 (two) times daily.  . naproxen (NAPROSYN) 500 MG tablet Take one po bid x 5d days take with food  . nitroGLYCERIN (NITROSTAT) 0.4 MG SL tablet Place 1 tablet (0.4 mg total) under the tongue every 5 (five) minutes as needed for chest pain.  . pantoprazole (PROTONIX) 40 MG tablet Take 1 tablet (40 mg total) by mouth daily.  . promethazine (PHENERGAN) 25 MG tablet Take 1 tablet (25 mg total) by mouth every 6 (six) hours as needed for nausea or vomiting.  . Simethicone (GAS-X PO) Take 2 tablets by mouth 4 (four) times daily as needed (gas).  . simvastatin (ZOCOR) 40 MG tablet TAKE 1 TABLET (40 MG  TOTAL) BY MOUTH DAILY.   No current facility-administered medications on file prior to visit.   Past Medical History  Diagnosis Date  . History of chicken pox   . Depression   . Type 2 diabetes, uncontrolled, with neuropathy     dx 1993  . Chronic bronchitis   . Syncopal episodes   . GERD (gastroesophageal reflux disease)   . Seasonal allergies   . High blood pressure   . HLD (hyperlipidemia)   . History of ovarian cyst   . Tubal pregnancy   . Tachyarrhythmia     ER visits with adenosine push to control  . CAD (coronary artery disease)      STEMI 07/2013 - treated with PTCA of the RCA followed by DES to the intermediate ramus  . Seizure 1977    off AEDs    Past Surgical History  Procedure Laterality Date  . Tonsillectomy and adenoidectomy  1965  . Ovarian cyst surgery  1998  . Dilation and curettage of uterus  1984  . Ectopic pregnancy surgery Right 1984    tube removal 2/2 hemorrhage  . Rotator cuff repair  2006    Right  . Cardiac catheterization  07/2013    angioplasty to RCA and DES to ramus intermedius  . Left heart catheterization with coronary angiogram N/A 07/23/2013    Procedure: LEFT HEART CATHETERIZATION WITH CORONARY ANGIOGRAM;  Surgeon: Sinclair Grooms, MD;  Location: [facility] CATH LAB;  Service: Cardiovascular;  Laterality: N/A;  . Percutaneous coronary stent intervention (pci-s) N/A 07/24/2013    Procedure: PERCUTANEOUS CORONARY STENT INTERVENTION (PCI-S);  Surgeon: Sinclair Grooms, MD;  Location: [facility] CATH LAB;  Service: Cardiovascular;  Laterality: N/A;    Review of Systems Per HPI unless specifically indicated above     Objective:    BP 130/70 mmHg  Pulse 72  Temp(Src) 97.8 F (36.6 C) (Oral)  Wt 175 lb 8 oz (79.606 kg)  Wt Readings from Last 3 Encounters:  05/28/14 175 lb 8 oz (79.606 kg)  05/14/14 182 lb 12.8 oz (82.918 kg)  03/27/14 184 lb 12 oz (83.802 kg)    Physical Exam  Constitutional: She appears well-developed and well-nourished. No distress.  HENT:  Mouth/Throat: Uvula is midline, oropharynx is clear and moist and mucous membranes are normal. Abnormal dentition. Dental caries present. No dental abscesses. No oropharyngeal exudate.  Markedly poor dentition with pulp of several teeth exposed. Marked point tenderness to palpation of R lower premolar as well as radiation of pain to jaw. No evidence of dental abscess today  Lymphadenopathy:       Head (right side): No submental, no submandibular, no tonsillar, no preauricular and no posterior auricular adenopathy present.       Head (left  side): No submental, no submandibular, no tonsillar, no preauricular and no posterior auricular adenopathy present.    She has no cervical adenopathy.  Nursing note and vitals reviewed.     Assessment & Plan:   Problem List Items Addressed This Visit    Type 2 diabetes, uncontrolled, with neuropathy    Chronically uncontrolled.  Discussed diet choices and need for better control esp in setting of possible dental work. Has had trouble affording insulin recently, but now back on novolog 70/30 60u bid, and metformin 1000mg  bid.  Lab Results  Component Value Date   HGBA1C 11.2* 02/21/2014         Pain, dental - Primary    Anticipate acute pulpitis R lower molar + gingivitis/cavities.  No obvious abscess today so did not prescribe abx - but she has had several courses of abx in last few months, and this issue has led to significant impairment - has missed multiple days from work 2/2 dental pain. Pt in process of establishing with dental clinic. Will fax today's note attn Dr. Raliegh Ip. Per cardiology, ok to hold plavix 5d prior to any necessary intervention as long as patient aware of small stent clotting risk with stopping plavix prior to 1 year out from stent (pt aware and wishes to proceed). I would continue aspirin perioperatively if dentist is comfortable with this. Appreciate dental care of patient.          Follow up plan: Return if symptoms worsen or fail to improve.

## 2014-05-29 ENCOUNTER — Ambulatory Visit: Payer: No Typology Code available for payment source | Admitting: Family Medicine

## 2014-06-18 ENCOUNTER — Encounter: Payer: Self-pay | Admitting: Family Medicine

## 2014-06-23 ENCOUNTER — Encounter: Payer: Self-pay | Admitting: Family Medicine

## 2014-07-19 ENCOUNTER — Encounter (HOSPITAL_COMMUNITY): Payer: Self-pay | Admitting: Physical Medicine and Rehabilitation

## 2014-07-19 ENCOUNTER — Emergency Department (HOSPITAL_COMMUNITY)
Admission: EM | Admit: 2014-07-19 | Discharge: 2014-07-19 | Disposition: A | Discharge status: Home / Self Care | Payer: 59 | Attending: Emergency Medicine | Admitting: Emergency Medicine

## 2014-07-19 DIAGNOSIS — Z7982 Long term (current) use of aspirin: Secondary | ICD-10-CM | POA: Diagnosis not present

## 2014-07-19 DIAGNOSIS — R238 Other skin changes: Secondary | ICD-10-CM | POA: Insufficient documentation

## 2014-07-19 DIAGNOSIS — Z794 Long term (current) use of insulin: Secondary | ICD-10-CM | POA: Insufficient documentation

## 2014-07-19 DIAGNOSIS — E785 Hyperlipidemia, unspecified: Secondary | ICD-10-CM | POA: Insufficient documentation

## 2014-07-19 DIAGNOSIS — R590 Localized enlarged lymph nodes: Secondary | ICD-10-CM | POA: Insufficient documentation

## 2014-07-19 DIAGNOSIS — Z8709 Personal history of other diseases of the respiratory system: Secondary | ICD-10-CM | POA: Insufficient documentation

## 2014-07-19 DIAGNOSIS — E1140 Type 2 diabetes mellitus with diabetic neuropathy, unspecified: Secondary | ICD-10-CM | POA: Insufficient documentation

## 2014-07-19 DIAGNOSIS — F329 Major depressive disorder, single episode, unspecified: Secondary | ICD-10-CM | POA: Diagnosis not present

## 2014-07-19 DIAGNOSIS — K1379 Other lesions of oral mucosa: Secondary | ICD-10-CM

## 2014-07-19 DIAGNOSIS — I2510 Atherosclerotic heart disease of native coronary artery without angina pectoris: Secondary | ICD-10-CM | POA: Insufficient documentation

## 2014-07-19 DIAGNOSIS — K219 Gastro-esophageal reflux disease without esophagitis: Secondary | ICD-10-CM | POA: Diagnosis not present

## 2014-07-19 DIAGNOSIS — R591 Generalized enlarged lymph nodes: Secondary | ICD-10-CM

## 2014-07-19 DIAGNOSIS — Z8619 Personal history of other infectious and parasitic diseases: Secondary | ICD-10-CM | POA: Diagnosis not present

## 2014-07-19 DIAGNOSIS — I10 Essential (primary) hypertension: Secondary | ICD-10-CM | POA: Insufficient documentation

## 2014-07-19 DIAGNOSIS — Z9861 Coronary angioplasty status: Secondary | ICD-10-CM | POA: Diagnosis not present

## 2014-07-19 DIAGNOSIS — Z880 Allergy status to penicillin: Secondary | ICD-10-CM | POA: Insufficient documentation

## 2014-07-19 DIAGNOSIS — Z791 Long term (current) use of non-steroidal anti-inflammatories (NSAID): Secondary | ICD-10-CM | POA: Insufficient documentation

## 2014-07-19 DIAGNOSIS — B348 Other viral infections of unspecified site: Secondary | ICD-10-CM | POA: Insufficient documentation

## 2014-07-19 DIAGNOSIS — Z8742 Personal history of other diseases of the female genital tract: Secondary | ICD-10-CM | POA: Diagnosis not present

## 2014-07-19 DIAGNOSIS — Z9889 Other specified postprocedural states: Secondary | ICD-10-CM | POA: Insufficient documentation

## 2014-07-19 DIAGNOSIS — Z79899 Other long term (current) drug therapy: Secondary | ICD-10-CM | POA: Insufficient documentation

## 2014-07-19 DIAGNOSIS — K1370 Unspecified lesions of oral mucosa: Secondary | ICD-10-CM | POA: Diagnosis present

## 2014-07-19 DIAGNOSIS — B9789 Other viral agents as the cause of diseases classified elsewhere: Secondary | ICD-10-CM

## 2014-07-19 MED ORDER — DOXYCYCLINE HYCLATE 100 MG PO CAPS: 100.0000 mg | ORAL_CAPSULE | Freq: Two times a day (BID) | ORAL | Status: DC

## 2014-07-19 MED ORDER — CEPHALEXIN 500 MG PO CAPS: 500.0000 mg | ORAL_CAPSULE | Freq: Four times a day (QID) | ORAL | Status: DC

## 2014-07-19 MED ORDER — HYDROCODONE-ACETAMINOPHEN 5-325 MG PO TABS: 1.0000 | ORAL_TABLET | ORAL | Status: DC | PRN

## 2014-07-19 NOTE — ED Notes (Signed)
Declined W/C at D/C and was escorted to lobby by RN. 

## 2014-07-19 NOTE — ED Notes (Signed)
Pt presents to department for evaluation of mouth pain. States swelling under tongue, states issues with same in the past, reports "swollen gland." no signs of distress noted.

## 2014-07-19 NOTE — Discharge Instructions (Signed)

## 2014-07-19 NOTE — ED Provider Notes (Signed)
CSN: [id number]     Arrival date & time 07/19/14  1458 History  This chart was scribed for Glendell Docker, NP, working with Shaune Pollack, MD by Steva Colder, ED Scribe. The patient was seen in room TR05C/TR05C at 3:56 PM.    Chief Complaint  Patient presents with  . Dental Pain      The history is provided by the patient. No language interpreter was used.    HPI Comments: Karen [name] is a 59 y.o. female who presents to the Emergency Department complaining of dental pain. She states that she is having associated symptoms of right sided swollen gland, toe redness on underside. She denies fever and any other symptoms. Pt recently had oral surgery in January. Pt is allergic to penicillin, zithromax, and lipitor.   Past Medical History  Diagnosis Date  . History of chicken pox   . Depression   . Type 2 diabetes, uncontrolled, with neuropathy     dx 1993  . Chronic bronchitis   . Syncopal episodes   . GERD (gastroesophageal reflux disease)   . Seasonal allergies   . High blood pressure   . HLD (hyperlipidemia)   . History of ovarian cyst   . Tubal pregnancy   . Tachyarrhythmia     ER visits with adenosine push to control  . CAD (coronary artery disease)     STEMI 07/2013 - treated with PTCA of the RCA followed by DES to the intermediate ramus  . Seizure 1977    off AEDs   Past Surgical History  Procedure Laterality Date  . Tonsillectomy and adenoidectomy  1965  . Ovarian cyst surgery  1998  . Dilation and curettage of uterus  1984  . Ectopic pregnancy surgery Right 1984    tube removal 2/2 hemorrhage  . Rotator cuff repair  2006    Right  . Cardiac catheterization  07/2013    angioplasty to RCA and DES to ramus intermedius  . Left heart catheterization with coronary angiogram N/A 07/23/2013    Procedure: LEFT HEART CATHETERIZATION WITH CORONARY ANGIOGRAM;  Surgeon: Sinclair Grooms, MD;  Location: [facility] CATH LAB;  Service: Cardiovascular;  Laterality: N/A;  . Percutaneous  coronary stent intervention (pci-s) N/A 07/24/2013    Procedure: PERCUTANEOUS CORONARY STENT INTERVENTION (PCI-S);  Surgeon: Sinclair Grooms, MD;  Location: [facility] CATH LAB;  Service: Cardiovascular;  Laterality: N/A;   Family History  Problem Relation Age of Onset  . Diabetes Mother   . Hyperlipidemia Mother   . Hypertension Mother   . Stroke Mother   . Coronary artery disease Mother     CABG  . Heart disease Mother   . Diabetes Father   . Hypertension Father   . Hyperlipidemia Father   . Coronary artery disease Father     CABG  . Heart disease Father     CHF  . Pancreatic cancer Father   . Heart disease Brother     arrhythmia  . Diabetes Maternal Grandmother   . Diabetes Maternal Grandfather   . Diabetes Paternal Grandmother   . Diabetes Paternal Grandfather   . Kidney disease Neg Hx    History  Substance Use Topics  . Smoking status: Never Smoker   . Smokeless tobacco: Never Used  . Alcohol Use: No   OB History    No data available     Review of Systems  Constitutional: Negative for fever.  HENT:       Redness under tongue,  swollen gland      Allergies  Clindamycin/lincomycin; Erythromycin; Lipitor; and Penicillins  Home Medications   Prior to Admission medications   Medication Sig Start Date End Date Taking? Authorizing Provider  acetaminophen (TYLENOL) 500 MG tablet Take 1,000 mg by mouth every 6 (six) hours as needed.    Historical Provider, MD  antipyrine-benzocaine Toniann Fail) otic solution Place 3-4 drops into the right ear every 2 (two) hours as needed for ear pain. 12/14/13   Audelia Hives Presson, PA  aspirin EC 81 MG EC tablet Take 1 tablet (81 mg total) by mouth daily. 07/25/13   Sueanne Margarita, MD  citalopram (CELEXA) 20 MG tablet TAKE 1 TABLET BY MOUTH DAILY 04/14/14   [name], MD  clopidogrel (PLAVIX) 75 MG tablet Take 1 tablet (75 mg total) by mouth daily. 08/09/13   Burtis Junes, NP  furosemide (LASIX) 40 MG tablet Take 0.5 tablets (20  mg total) by mouth daily as needed for edema. 06/24/14   [name], MD  gabapentin (NEURONTIN) 100 MG capsule Take 1 capsule (100 mg total) by mouth at bedtime. 04/15/14   [name], MD  HYDROcodone-acetaminophen (NORCO) 10-325 MG per tablet Take 1 tablet by mouth every 8 (eight) hours as needed. 05/21/14   [name], MD  insulin NPH-regular Human (NOVOLIN 70/30) (70-30) 100 UNIT/ML injection Inject 60 Units into the skin 2 (two) times daily with a meal. 05/14/14   [name], MD  lisinopril (ZESTRIL) 2.5 MG tablet Take 1 tablet (2.5 mg total) by mouth daily. 04/15/14   [name], MD  metFORMIN (GLUCOPHAGE) 1000 MG tablet TAKE 1 TABLET BY MOUTH TWICE DAILY WITH MEALS    [name], MD  metoprolol tartrate (LOPRESSOR) 25 MG tablet Take 12.5 mg by mouth 2 (two) times daily. 12/19/13   Historical Provider, MD  naproxen (NAPROSYN) 500 MG tablet Take one po bid x 5d days take with food 05/26/14   [name], MD  nitroGLYCERIN (NITROSTAT) 0.4 MG SL tablet Place 1 tablet (0.4 mg total) under the tongue every 5 (five) minutes as needed for chest pain. 07/25/13   Brittainy Erie Noe, PA-C  pantoprazole (PROTONIX) 40 MG tablet Take 1 tablet (40 mg total) by mouth daily. 02/12/14   [name], MD  potassium chloride (K-DUR,KLOR-CON) 10 MEQ tablet Take 1 tablet (10 mEq total) by mouth daily as needed. With lasix 06/24/14   [name], MD  promethazine (PHENERGAN) 25 MG tablet Take 1 tablet (25 mg total) by mouth every 6 (six) hours as needed for nausea or vomiting. 12/07/13   Waldemar Dickens, MD  Simethicone (GAS-X PO) Take 2 tablets by mouth 4 (four) times daily as needed (gas).    Historical Provider, MD  simvastatin (ZOCOR) 40 MG tablet TAKE 1 TABLET (40 MG TOTAL) BY MOUTH DAILY. 03/31/14   [name], MD   BP 144/61 mmHg  Pulse 84  Temp(Src) 98.9 F (37.2 C) (Oral)  Resp 14  SpO2 95% Physical Exam  Constitutional: She is oriented to person, place, and  time. She appears well-developed and well-nourished. No distress.  HENT:  Head: Normocephalic and atraumatic.  Right Ear: External ear normal.  Left Ear: External ear normal.  Vesicles noted to below the tongue on the right side. Submandibular lymphadenopathy. No sign of ludwigs angina  Eyes: EOM are normal.  Neck: Neck supple. No tracheal deviation present.  Cardiovascular: Normal rate.   Pulmonary/Chest: Effort normal. No respiratory distress.  Musculoskeletal: Normal range of motion.  Neurological:  She is alert and oriented to person, place, and time.  Skin: Skin is warm and dry.  Psychiatric: She has a normal mood and affect. Her behavior is normal.  Nursing note and vitals reviewed.   ED Course  Procedures (including critical care time) DIAGNOSTIC STUDIES: Oxygen Saturation is 95% on RA, adequate by my interpretation.    COORDINATION OF CARE: 3:58 PM-Discussed treatment plan which includes abx, f/u with PCP with pt at bedside and pt agreed to plan.   Labs Review Labs Reviewed - No data to display  Imaging Review No results found.   EKG Interpretation None      MDM   Final diagnoses:  Lymphadenopathy  Viral vesicles of mouth    Pt is okay to follow up with pcp. Discussed treatment for lymphadenopathy. Vesicles likely viral  I personally performed the services described in this documentation, which was scribed in my presence. The recorded information has been reviewed and is accurate.    Glendell Docker, NP 07/19/14 5102  Shaune Pollack, MD 07/20/14 [phone]

## 2014-07-21 ENCOUNTER — Encounter: Payer: Self-pay | Admitting: Family Medicine

## 2014-07-21 MED ORDER — TRAMADOL HCL 50 MG PO TABS: 50.0000 mg | ORAL_TABLET | Freq: Two times a day (BID) | ORAL | Status: DC | PRN

## 2014-07-21 NOTE — Telephone Encounter (Signed)
plz phone in tramadol for patient for pain for viral ulcers in mouth.

## 2014-07-22 NOTE — Telephone Encounter (Signed)
Called in tramadol to [employer].

## 2014-08-04 ENCOUNTER — Other Ambulatory Visit: Payer: Self-pay | Admitting: *Deleted

## 2014-08-04 MED ORDER — SIMVASTATIN 40 MG PO TABS: ORAL_TABLET | ORAL | Status: DC

## 2014-08-05 ENCOUNTER — Other Ambulatory Visit: Payer: Self-pay | Admitting: *Deleted

## 2014-08-05 MED ORDER — PANTOPRAZOLE SODIUM 40 MG PO TBEC: 40.0000 mg | DELAYED_RELEASE_TABLET | Freq: Every day | ORAL | Status: DC

## 2014-08-14 ENCOUNTER — Other Ambulatory Visit: Payer: Self-pay | Admitting: *Deleted

## 2014-08-14 MED ORDER — METFORMIN HCL 1000 MG PO TABS: 1000.0000 mg | ORAL_TABLET | Freq: Two times a day (BID) | ORAL | Status: DC

## 2014-09-16 ENCOUNTER — Other Ambulatory Visit: Payer: Self-pay | Admitting: *Deleted

## 2014-09-16 MED ORDER — CITALOPRAM HYDROBROMIDE 20 MG PO TABS: 20.0000 mg | ORAL_TABLET | Freq: Every day | ORAL | Status: DC

## 2014-10-07 ENCOUNTER — Other Ambulatory Visit: Payer: Self-pay | Admitting: *Deleted

## 2014-10-07 MED ORDER — LISINOPRIL 2.5 MG PO TABS: 2.5000 mg | ORAL_TABLET | Freq: Every day | ORAL | Status: DC

## 2014-10-08 ENCOUNTER — Encounter: Payer: Self-pay | Admitting: Family Medicine

## 2014-10-08 NOTE — Telephone Encounter (Signed)
Please see Mychart message from patient.  

## 2014-10-09 ENCOUNTER — Encounter: Payer: Self-pay | Admitting: Family Medicine

## 2014-10-10 ENCOUNTER — Emergency Department (HOSPITAL_COMMUNITY): Payer: 59

## 2014-10-10 ENCOUNTER — Emergency Department (HOSPITAL_COMMUNITY)
Admission: EM | Admit: 2014-10-10 | Discharge: 2014-10-10 | Disposition: A | Discharge status: Home / Self Care | Payer: 59 | Attending: Emergency Medicine | Admitting: Emergency Medicine

## 2014-10-10 ENCOUNTER — Encounter (HOSPITAL_COMMUNITY): Payer: Self-pay | Admitting: *Deleted

## 2014-10-10 DIAGNOSIS — K115 Sialolithiasis: Secondary | ICD-10-CM | POA: Diagnosis not present

## 2014-10-10 DIAGNOSIS — Z880 Allergy status to penicillin: Secondary | ICD-10-CM | POA: Insufficient documentation

## 2014-10-10 DIAGNOSIS — E1140 Type 2 diabetes mellitus with diabetic neuropathy, unspecified: Secondary | ICD-10-CM | POA: Diagnosis not present

## 2014-10-10 DIAGNOSIS — E785 Hyperlipidemia, unspecified: Secondary | ICD-10-CM | POA: Insufficient documentation

## 2014-10-10 DIAGNOSIS — F329 Major depressive disorder, single episode, unspecified: Secondary | ICD-10-CM | POA: Diagnosis not present

## 2014-10-10 DIAGNOSIS — I2510 Atherosclerotic heart disease of native coronary artery without angina pectoris: Secondary | ICD-10-CM | POA: Insufficient documentation

## 2014-10-10 DIAGNOSIS — Z794 Long term (current) use of insulin: Secondary | ICD-10-CM | POA: Diagnosis not present

## 2014-10-10 DIAGNOSIS — Z7982 Long term (current) use of aspirin: Secondary | ICD-10-CM | POA: Insufficient documentation

## 2014-10-10 DIAGNOSIS — Z8709 Personal history of other diseases of the respiratory system: Secondary | ICD-10-CM | POA: Insufficient documentation

## 2014-10-10 DIAGNOSIS — K029 Dental caries, unspecified: Secondary | ICD-10-CM | POA: Insufficient documentation

## 2014-10-10 DIAGNOSIS — Z8619 Personal history of other infectious and parasitic diseases: Secondary | ICD-10-CM | POA: Insufficient documentation

## 2014-10-10 DIAGNOSIS — K219 Gastro-esophageal reflux disease without esophagitis: Secondary | ICD-10-CM | POA: Diagnosis not present

## 2014-10-10 DIAGNOSIS — Z9861 Coronary angioplasty status: Secondary | ICD-10-CM | POA: Diagnosis not present

## 2014-10-10 DIAGNOSIS — Z9889 Other specified postprocedural states: Secondary | ICD-10-CM | POA: Diagnosis not present

## 2014-10-10 DIAGNOSIS — G40909 Epilepsy, unspecified, not intractable, without status epilepticus: Secondary | ICD-10-CM | POA: Diagnosis not present

## 2014-10-10 DIAGNOSIS — Z8742 Personal history of other diseases of the female genital tract: Secondary | ICD-10-CM | POA: Diagnosis not present

## 2014-10-10 DIAGNOSIS — Z7902 Long term (current) use of antithrombotics/antiplatelets: Secondary | ICD-10-CM | POA: Diagnosis not present

## 2014-10-10 DIAGNOSIS — Z792 Long term (current) use of antibiotics: Secondary | ICD-10-CM | POA: Diagnosis not present

## 2014-10-10 DIAGNOSIS — Z79899 Other long term (current) drug therapy: Secondary | ICD-10-CM | POA: Insufficient documentation

## 2014-10-10 DIAGNOSIS — R110 Nausea: Secondary | ICD-10-CM | POA: Diagnosis present

## 2014-10-10 MED ORDER — OXYCODONE-ACETAMINOPHEN 5-325 MG PO TABS
1.0000 | ORAL_TABLET | Freq: Once | ORAL | Status: AC
  Administered 2014-10-10: 1 via ORAL
  Filled 2014-10-10: qty 1

## 2014-10-10 MED ORDER — HYDROCODONE-ACETAMINOPHEN 5-325 MG PO TABS: 1.0000 | ORAL_TABLET | Freq: Four times a day (QID) | ORAL | Status: DC | PRN

## 2014-10-10 NOTE — ED Provider Notes (Signed)
CSN: [id number]     Arrival date & time 10/10/14  1907 History  This chart was scribed for non-physician practitioner Etta Quill, NP working with Quintella Reichert, MD by Meriel Pica, ED Scribe. This patient was seen in room TR05C/TR05C and the patient's care was started at 7:56 PM.    Chief Complaint  Patient presents with  . Oral Pain   Patient is a 59 y.o. female presenting with mouth pain. The history is provided by the patient. No language interpreter was used.  Oral Pain This is a new problem. The current episode started more than 1 week ago. The problem occurs constantly. The problem has not changed since onset.Pertinent negatives include no shortness of breath.    HPI Comments: Karen [name] is a 59 y.o. female who presents to the Emergency Department complaining of constant, moderate right-sided oral pain onset two weeks ago. Pt reports she has associated nausea. She was evaluated by her dentist yesterday and diagnosed with a sialolithiasis for which she was given Keflex, but no pain medication. She denies, SOB, fever or vomiting. Pt does not wear dentures.    Past Medical History  Diagnosis Date  . History of chicken pox   . Depression   . Type 2 diabetes, uncontrolled, with neuropathy     dx 1993  . Chronic bronchitis   . Syncopal episodes   . GERD (gastroesophageal reflux disease)   . Seasonal allergies   . High blood pressure   . HLD (hyperlipidemia)   . History of ovarian cyst   . Tubal pregnancy   . Tachyarrhythmia     ER visits with adenosine push to control  . CAD (coronary artery disease)     STEMI 07/2013 - treated with PTCA of the RCA followed by DES to the intermediate ramus  . Seizure 1977    off AEDs   Past Surgical History  Procedure Laterality Date  . Tonsillectomy and adenoidectomy  1965  . Ovarian cyst surgery  1998  . Dilation and curettage of uterus  1984  . Ectopic pregnancy surgery Right 1984    tube removal 2/2 hemorrhage  . Rotator cuff  repair  2006    Right  . Cardiac catheterization  07/2013    angioplasty to RCA and DES to ramus intermedius  . Left heart catheterization with coronary angiogram N/A 07/23/2013    Procedure: LEFT HEART CATHETERIZATION WITH CORONARY ANGIOGRAM;  Surgeon: Sinclair Grooms, MD;  Location: [facility] CATH LAB;  Service: Cardiovascular;  Laterality: N/A;  . Percutaneous coronary stent intervention (pci-s) N/A 07/24/2013    Procedure: PERCUTANEOUS CORONARY STENT INTERVENTION (PCI-S);  Surgeon: Sinclair Grooms, MD;  Location: [facility] CATH LAB;  Service: Cardiovascular;  Laterality: N/A;   Family History  Problem Relation Age of Onset  . Diabetes Mother   . Hyperlipidemia Mother   . Hypertension Mother   . Stroke Mother   . Coronary artery disease Mother     CABG  . Heart disease Mother   . Diabetes Father   . Hypertension Father   . Hyperlipidemia Father   . Coronary artery disease Father     CABG  . Heart disease Father     CHF  . Pancreatic cancer Father   . Heart disease Brother     arrhythmia  . Diabetes Maternal Grandmother   . Diabetes Maternal Grandfather   . Diabetes Paternal Grandmother   . Diabetes Paternal Grandfather   . Kidney disease Neg Hx  History  Substance Use Topics  . Smoking status: Never Smoker   . Smokeless tobacco: Never Used  . Alcohol Use: No   OB History    No data available     Review of Systems  Constitutional: Negative for fever.  HENT: Positive for dental problem.   Respiratory: Negative for shortness of breath.   Gastrointestinal: Positive for nausea. Negative for vomiting.  All other systems reviewed and are negative.   Allergies  Clindamycin/lincomycin; Erythromycin; Lipitor; and Penicillins  Home Medications   Prior to Admission medications   Medication Sig Start Date End Date Taking? Authorizing Provider  acetaminophen (TYLENOL) 500 MG tablet Take 1,000 mg by mouth every 6 (six) hours as needed.    Historical Provider, MD   antipyrine-benzocaine Toniann Fail) otic solution Place 3-4 drops into the right ear every 2 (two) hours as needed for ear pain. 12/14/13   Audelia Hives Presson, PA  aspirin EC 81 MG EC tablet Take 1 tablet (81 mg total) by mouth daily. 07/25/13   Sueanne Margarita, MD  cephALEXin (KEFLEX) 500 MG capsule Take 1 capsule (500 mg total) by mouth 4 (four) times daily. 07/19/14   Glendell Docker, NP  citalopram (CELEXA) 20 MG tablet Take 1 tablet (20 mg total) by mouth daily. 09/16/14   [name], MD  clopidogrel (PLAVIX) 75 MG tablet Take 1 tablet (75 mg total) by mouth daily. 08/09/13   Burtis Junes, NP  furosemide (LASIX) 40 MG tablet Take 0.5 tablets (20 mg total) by mouth daily as needed for edema. 06/24/14   [name], MD  gabapentin (NEURONTIN) 100 MG capsule Take 1 capsule (100 mg total) by mouth at bedtime. 04/15/14   [name], MD  HYDROcodone-acetaminophen (NORCO/VICODIN) 5-325 MG per tablet Take 1-2 tablets by mouth every 4 (four) hours as needed. 07/19/14   Glendell Docker, NP  insulin NPH-regular Human (NOVOLIN 70/30) (70-30) 100 UNIT/ML injection Inject 60 Units into the skin 2 (two) times daily with a meal. 05/14/14   [name], MD  lisinopril (ZESTRIL) 2.5 MG tablet Take 1 tablet (2.5 mg total) by mouth daily. 10/07/14   [name], MD  metFORMIN (GLUCOPHAGE) 1000 MG tablet Take 1 tablet (1,000 mg total) by mouth 2 (two) times daily with a meal. 08/14/14   [name], MD  metoprolol tartrate (LOPRESSOR) 25 MG tablet Take 12.5 mg by mouth 2 (two) times daily. 12/19/13   Historical Provider, MD  nitroGLYCERIN (NITROSTAT) 0.4 MG SL tablet Place 1 tablet (0.4 mg total) under the tongue every 5 (five) minutes as needed for chest pain. 07/25/13   Brittainy Erie Noe, PA-C  pantoprazole (PROTONIX) 40 MG tablet Take 1 tablet (40 mg total) by mouth daily. 08/05/14   [name], MD  potassium chloride (K-DUR,KLOR-CON) 10 MEQ tablet Take 1 tablet (10 mEq total) by mouth  daily as needed. With lasix 06/24/14   [name], MD  promethazine (PHENERGAN) 25 MG tablet Take 1 tablet (25 mg total) by mouth every 6 (six) hours as needed for nausea or vomiting. 12/07/13   Waldemar Dickens, MD  Simethicone (GAS-X PO) Take 2 tablets by mouth 4 (four) times daily as needed (gas).    Historical Provider, MD  simvastatin (ZOCOR) 40 MG tablet TAKE 1 TABLET (40 MG TOTAL) BY MOUTH DAILY. 08/04/14   [name], MD  traMADol (ULTRAM) 50 MG tablet Take 1-2 tablets (50-100 mg total) by mouth every 12 (twelve) hours as needed for moderate pain. 07/21/14   [name],  MD   BP 191/78 mmHg  Pulse 105  Temp(Src) 98.3 F (36.8 C) (Oral)  Resp 20  SpO2 94%   Physical Exam  Constitutional: She is oriented to person, place, and time. She appears well-developed and well-nourished. No distress.  HENT:  Head: Normocephalic and atraumatic.  Missing majority of teeth, remaining teeth decayed .   Eyes: Conjunctivae and EOM are normal.  Neck: Neck supple.  Cardiovascular: Normal rate.   Pulmonary/Chest: Effort normal and breath sounds normal. No respiratory distress.  Lymphadenopathy:    She has no cervical adenopathy.  Neurological: She is alert and oriented to person, place, and time.  Skin: Skin is warm.  Psychiatric: Her behavior is normal.  Nursing note and vitals reviewed.   ED Course  Procedures  DIAGNOSTIC STUDIES: Oxygen Saturation is 94% on RA, adequate by my interpretation.    COORDINATION OF CARE: 7:58 PM Discussed treatment plan which includes ordering pain medication and a CT scan with pt. Pt acknowledges and agrees to plan.   Labs Review Labs Reviewed - No data to display  Imaging Review Ct Maxillofacial Wo Cm  10/10/2014   CLINICAL DATA:  Right-sided facial pain for 2 weeks with recent diagnosis of salivary gland duct stone  EXAM: CT MAXILLOFACIAL WITHOUT CONTRAST  TECHNIQUE: Multidetector CT imaging of the maxillofacial structures was performed.  Multiplanar CT image reconstructions were also generated. A small metallic BB was placed on the right temple in order to reliably differentiate right from left.  COMPARISON:  None.  FINDINGS: There is a 3.6 mm calcification noted along the floor of the mouth consistent with a stone within the submandibular gland duct on the right. Gland demonstrates some mild inflammatory change consistent with the obstructed nature of the duct. No focal abscess is seen. Few scattered small lymph nodes are noted. The parotid glands are well visualized bilaterally. The the submandibular gland on the left is within normal limits. No other facial abnormality is seen. The paranasal sinuses as well as the orbits are within normal limits. No acute bony abnormality is seen. Dental caries are noted as well as loss of multiple teeth.  IMPRESSION: 3.6 mm right submandibular gland duct stone with inflammatory changes of the gland consistent with the obstruction.   Electronically Signed   By: Inez Catalina M.D.   On: 10/10/2014 20:36     EKG Interpretation None      MDM   Final diagnoses:  None    Salivary stone. Patient is already on keflex from dentist. Pain medication rx provided. Follow-up with PCP and/or ENT. Return precautions discussed.  I personally performed the services described in this documentation, which was scribed in my presence. The recorded information has been reviewed and is accurate.    Etta Quill, NP 10/11/14 0154  Quintella Reichert, MD 10/20/14 [phone]

## 2014-10-10 NOTE — Discharge Instructions (Signed)
Salivary Stone Your exam shows you have a stone in one of your saliva glands. These small stones form around a mucous plug in the ducts of the glands and cause the saliva in the gland to be blocked. This makes the gland swollen and painful, especially when you eat. If repeated episodes occur, the gland can become infected. Sometimes these stones can be seen on x-ray. Treatment includes stimulating the production of saliva to push the stone out. You should suck on a lemon or sour candies several times daily. Antibiotic medicine may be needed if the gland is infected. Increasing fluids, applying warm compresses to the swollen area 3-4 times daily, and massaging the gland from back to front may encourage drainage and passage of the stone. Surgical treatment to remove the stone is sometimes necessary, so proper medical follow up is very important. Call your doctor for an appointment as recommended. Call right away if you have a high fever, severe headache, vomiting, uncontrolled pain, or other serious symptoms. Document Released: 06/09/2004 Document Revised: 07/25/2011 Document Reviewed: 05/02/2005 [facility] Patient Information 2015 [place], [place]. This information is not intended to replace advice given to you by your health care provider. Make sure you discuss any questions you have with your health care provider.   CONTINUE WITH THE ANTIBIOTIC.  FOLLOW-UP WITH YOUR PRIMARY CARE PROVIDER OR THE EAR, NOSE, AND THROAT SPECIALIST LISTED.

## 2014-10-10 NOTE — ED Notes (Signed)
Pt states she thinks she has an impacted or infected saliva gland on her right side. Pain started two weeks ago. Pt was seen by dentist yesterday where she recieeved her dx. Pt started Keflex yesterday.

## 2014-10-10 NOTE — ED Notes (Signed)
Patient transported to CT 

## 2014-10-13 ENCOUNTER — Encounter: Payer: Self-pay | Admitting: Family Medicine

## 2014-10-13 DIAGNOSIS — K115 Sialolithiasis: Secondary | ICD-10-CM

## 2014-10-15 HISTORY — PX: SUBMANDIBULAR DUCT LIGATION: SHX2455

## 2014-10-17 ENCOUNTER — Encounter: Payer: Self-pay | Admitting: Family Medicine

## 2014-10-23 ENCOUNTER — Other Ambulatory Visit: Payer: Self-pay | Admitting: *Deleted

## 2014-10-23 MED ORDER — METOPROLOL TARTRATE 25 MG PO TABS: 12.5000 mg | ORAL_TABLET | Freq: Two times a day (BID) | ORAL | Status: DC

## 2014-10-28 ENCOUNTER — Encounter: Payer: Self-pay | Admitting: Family Medicine

## 2014-10-28 DIAGNOSIS — I2510 Atherosclerotic heart disease of native coronary artery without angina pectoris: Secondary | ICD-10-CM

## 2014-10-28 DIAGNOSIS — Z9861 Coronary angioplasty status: Principal | ICD-10-CM

## 2014-10-28 NOTE — Telephone Encounter (Signed)
Please see Mychart message from patient.  

## 2014-10-29 MED ORDER — CLOPIDOGREL BISULFATE 75 MG PO TABS
75.0000 mg | ORAL_TABLET | Freq: Every day | ORAL | Status: DC
Start: 2014-10-29 — End: 2015-01-29

## 2014-10-29 NOTE — Telephone Encounter (Signed)
Signed and in kim's box

## 2014-10-29 NOTE — Telephone Encounter (Signed)
Form in your IN box for completion. Patient has been notified that she will have to complete her information and submit the form.

## 2014-10-29 NOTE — Telephone Encounter (Signed)
See mychart message. referred to cards and plavix refilled. Can we check for online med assistance program for plavix?

## 2014-11-05 ENCOUNTER — Telehealth: Payer: Self-pay

## 2014-11-05 NOTE — Telephone Encounter (Signed)
Diabetic Bundle. I tried to contact the pt. Phone number listed would not connect.

## 2014-11-08 ENCOUNTER — Encounter: Payer: Self-pay | Admitting: Family Medicine

## 2014-11-10 ENCOUNTER — Telehealth: Payer: Self-pay | Admitting: Family Medicine

## 2014-11-10 NOTE — Telephone Encounter (Signed)
Please see Mychart message from patient. Dr. Darnell Level isn't here.

## 2014-11-10 NOTE — Telephone Encounter (Signed)
Please call pt.  See mychart message.  She has aches.  How long? How severe? She stated that tylenol and tramadol didn't help.  She is on ASA/plavix.  Thanks.  Let me know.

## 2014-11-10 NOTE — Telephone Encounter (Signed)
Left message on voice mail  to call back

## 2014-11-11 MED ORDER — METHOCARBAMOL 500 MG PO TABS: 500.0000 mg | ORAL_TABLET | Freq: Three times a day (TID) | ORAL | Status: DC | PRN

## 2014-11-11 NOTE — Telephone Encounter (Signed)
Left message on patient's voicemail to return call

## 2014-11-19 ENCOUNTER — Telehealth: Payer: Self-pay | Admitting: *Deleted

## 2014-11-19 NOTE — Telephone Encounter (Signed)
Lm on pts vm regarding scheduling mammogram

## 2014-11-26 ENCOUNTER — Encounter (HOSPITAL_COMMUNITY): Payer: Self-pay | Admitting: Emergency Medicine

## 2014-11-26 ENCOUNTER — Emergency Department (HOSPITAL_COMMUNITY)
Admission: EM | Admit: 2014-11-26 | Discharge: 2014-11-26 | Disposition: A | Discharge status: Home / Self Care | Payer: 59 | Attending: Emergency Medicine | Admitting: Emergency Medicine

## 2014-11-26 ENCOUNTER — Ambulatory Visit: Payer: 59 | Admitting: Primary Care

## 2014-11-26 DIAGNOSIS — Z9861 Coronary angioplasty status: Secondary | ICD-10-CM | POA: Insufficient documentation

## 2014-11-26 DIAGNOSIS — Z8619 Personal history of other infectious and parasitic diseases: Secondary | ICD-10-CM | POA: Insufficient documentation

## 2014-11-26 DIAGNOSIS — Z7982 Long term (current) use of aspirin: Secondary | ICD-10-CM | POA: Insufficient documentation

## 2014-11-26 DIAGNOSIS — R112 Nausea with vomiting, unspecified: Secondary | ICD-10-CM | POA: Diagnosis present

## 2014-11-26 DIAGNOSIS — Z7902 Long term (current) use of antithrombotics/antiplatelets: Secondary | ICD-10-CM | POA: Insufficient documentation

## 2014-11-26 DIAGNOSIS — Z79899 Other long term (current) drug therapy: Secondary | ICD-10-CM | POA: Diagnosis not present

## 2014-11-26 DIAGNOSIS — Z8742 Personal history of other diseases of the female genital tract: Secondary | ICD-10-CM | POA: Insufficient documentation

## 2014-11-26 DIAGNOSIS — R739 Hyperglycemia, unspecified: Secondary | ICD-10-CM

## 2014-11-26 DIAGNOSIS — E1165 Type 2 diabetes mellitus with hyperglycemia: Secondary | ICD-10-CM | POA: Diagnosis not present

## 2014-11-26 DIAGNOSIS — F329 Major depressive disorder, single episode, unspecified: Secondary | ICD-10-CM | POA: Insufficient documentation

## 2014-11-26 DIAGNOSIS — E785 Hyperlipidemia, unspecified: Secondary | ICD-10-CM | POA: Diagnosis not present

## 2014-11-26 DIAGNOSIS — K219 Gastro-esophageal reflux disease without esophagitis: Secondary | ICD-10-CM | POA: Insufficient documentation

## 2014-11-26 DIAGNOSIS — Z880 Allergy status to penicillin: Secondary | ICD-10-CM | POA: Insufficient documentation

## 2014-11-26 DIAGNOSIS — E1140 Type 2 diabetes mellitus with diabetic neuropathy, unspecified: Secondary | ICD-10-CM | POA: Insufficient documentation

## 2014-11-26 DIAGNOSIS — R197 Diarrhea, unspecified: Secondary | ICD-10-CM | POA: Insufficient documentation

## 2014-11-26 DIAGNOSIS — Z794 Long term (current) use of insulin: Secondary | ICD-10-CM | POA: Diagnosis not present

## 2014-11-26 DIAGNOSIS — Z9889 Other specified postprocedural states: Secondary | ICD-10-CM | POA: Diagnosis not present

## 2014-11-26 DIAGNOSIS — I2510 Atherosclerotic heart disease of native coronary artery without angina pectoris: Secondary | ICD-10-CM | POA: Insufficient documentation

## 2014-11-26 DIAGNOSIS — R110 Nausea: Secondary | ICD-10-CM

## 2014-11-26 LAB — CBC WITH DIFFERENTIAL/PLATELET
Basophils Absolute: 0 10*3/uL (ref 0.0–0.1)
Basophils Relative: 0 % (ref 0–1)
EOS PCT: 3 % (ref 0–5)
Eosinophils Absolute: 0.1 10*3/uL (ref 0.0–0.7)
HCT: 42.2 % (ref 36.0–46.0)
HEMOGLOBIN: 14 g/dL (ref 12.0–15.0)
LYMPHS PCT: 42 % (ref 12–46)
Lymphs Abs: 2 10*3/uL (ref 0.7–4.0)
MCH: 28.1 pg (ref 26.0–34.0)
MCHC: 33.2 g/dL (ref 30.0–36.0)
MCV: 84.7 fL (ref 78.0–100.0)
MONO ABS: 0.3 10*3/uL (ref 0.1–1.0)
Monocytes Relative: 7 % (ref 3–12)
Neutro Abs: 2.4 10*3/uL (ref 1.7–7.7)
Neutrophils Relative %: 48 % (ref 43–77)
PLATELETS: 158 10*3/uL (ref 150–400)
RBC: 4.98 MIL/uL (ref 3.87–5.11)
RDW: 13.3 % (ref 11.5–15.5)
WBC: 4.9 10*3/uL (ref 4.0–10.5)

## 2014-11-26 LAB — COMPREHENSIVE METABOLIC PANEL
ALT: 22 U/L (ref 14–54)
ANION GAP: 9 (ref 5–15)
AST: 24 U/L (ref 15–41)
Albumin: 3.5 g/dL (ref 3.5–5.0)
Alkaline Phosphatase: 80 U/L (ref 38–126)
BILIRUBIN TOTAL: 0.4 mg/dL (ref 0.3–1.2)
BUN: 15 mg/dL (ref 6–20)
CO2: 26 mmol/L (ref 22–32)
CREATININE: 0.53 mg/dL (ref 0.44–1.00)
Calcium: 9.4 mg/dL (ref 8.9–10.3)
Chloride: 96 mmol/L — ABNORMAL LOW (ref 101–111)
GFR calc non Af Amer: >60 mL/min (ref >60)
GLUCOSE: 358 mg/dL — AB (ref 65–99)
Potassium: 4.7 mmol/L (ref 3.5–5.1)
Sodium: 131 mmol/L — ABNORMAL LOW (ref 135–145)
TOTAL PROTEIN: 7.1 g/dL (ref 6.5–8.1)

## 2014-11-26 LAB — LIPASE, BLOOD: LIPASE: 24 U/L (ref 22–51)

## 2014-11-26 MED ORDER — SODIUM CHLORIDE 0.9 % IV BOLUS (SEPSIS)
1000.0000 mL | Freq: Once | INTRAVENOUS | Status: AC
  Administered 2014-11-26: 1000 mL via INTRAVENOUS

## 2014-11-26 MED ORDER — LOPERAMIDE HCL 2 MG PO CAPS: 2.0000 mg | ORAL_CAPSULE | Freq: Four times a day (QID) | ORAL | Status: DC | PRN

## 2014-11-26 MED ORDER — ONDANSETRON HCL 4 MG/2ML IJ SOLN
4.0000 mg | Freq: Once | INTRAMUSCULAR | Status: AC
  Administered 2014-11-26: 4 mg via INTRAVENOUS
  Filled 2014-11-26: qty 2

## 2014-11-26 MED ORDER — PROMETHAZINE HCL 25 MG PO TABS: 25.0000 mg | ORAL_TABLET | Freq: Four times a day (QID) | ORAL | Status: DC | PRN

## 2014-11-26 NOTE — Discharge Instructions (Signed)
Your blood sugar today was elevated. Take your diabetes medications as directed and follow with your primary care Dr. in the next 2 days.  Push fluids: take small frequent sips of water or Gatorade, do not drink any soda, juice or caffeinated beverages.    Slowly resume solid diet as desired. Avoid food that are spicy, contain dairy and/or have high fat content.  Aviod NSAIDs (aspirin, motrin, ibuprofen, naproxen, Aleve et Ronney Asters) for pain control because they will irritate your stomach.  Wash your hands frequently.  Please follow with your primary care doctor in the next 2 days for a check-up. They must obtain records for further management.   Do not hesitate to return to the Emergency Department for any new, worsening or concerning symptoms.

## 2014-11-26 NOTE — ED Notes (Signed)
Patient states N/V yesterday with diarrhea.  Patient states just nausea and diarrhea today.   Patient states she went into work today with same and left early.  Patient wants note for work.

## 2014-11-26 NOTE — ED Provider Notes (Signed)
CSN: [id number]     Arrival date & time 11/26/14  0944 History   First MD Initiated Contact with Patient 11/26/14 [phone]     Chief Complaint  Patient presents with  . Nausea  . Diarrhea     (Consider location/radiation/quality/duration/timing/severity/associated sxs/prior Treatment) HPI   Blood pressure 129/62, pulse 62, temperature 98 F (36.7 C), temperature source Oral, resp. rate 18, height 5' (1.524 m), weight 182 lb (82.555 kg), SpO2 96 %.  Karen [name] is a 59 y.o. female complaining of nausea without vomiting (she is taking phentermine) complaining of multiple episodes of nonbloody, non-melanotic diarrhea onset day before yesterday. Patient states that she has a lower abdominal cramping just before she has a bowel movement but no other abdominal pain, this resolved over the last day, she denies fever, chills, change in urination, abnormal vaginal discharge. She has a positive sick contact in that her sister also has diarrhea.  Past Medical History  Diagnosis Date  . History of chicken pox   . Depression   . Type 2 diabetes, uncontrolled, with neuropathy     dx 1993  . Chronic bronchitis   . Syncopal episodes   . GERD (gastroesophageal reflux disease)   . Seasonal allergies   . High blood pressure   . HLD (hyperlipidemia)   . History of ovarian cyst   . Tubal pregnancy   . Tachyarrhythmia     ER visits with adenosine push to control  . CAD (coronary artery disease)     STEMI 07/2013 - treated with PTCA of the RCA followed by DES to the intermediate ramus  . Seizure 1977    off AEDs   Past Surgical History  Procedure Laterality Date  . Tonsillectomy and adenoidectomy  1965  . Ovarian cyst surgery  1998  . Dilation and curettage of uterus  1984  . Ectopic pregnancy surgery Right 1984    tube removal 2/2 hemorrhage  . Rotator cuff repair  2006    Right  . Cardiac catheterization  07/2013    angioplasty to RCA and DES to ramus intermedius  . Left heart  catheterization with coronary angiogram N/A 07/23/2013    Procedure: LEFT HEART CATHETERIZATION WITH CORONARY ANGIOGRAM;  Surgeon: Sinclair Grooms, MD;  Location: [facility] CATH LAB;  Service: Cardiovascular;  Laterality: N/A;  . Percutaneous coronary stent intervention (pci-s) N/A 07/24/2013    Procedure: PERCUTANEOUS CORONARY STENT INTERVENTION (PCI-S);  Surgeon: Sinclair Grooms, MD;  Location: [facility] CATH LAB;  Service: Cardiovascular;  Laterality: N/A;  . Submandibular duct ligation Right 10/2014    R submandibular gland stone removed Erik Obey)   Family History  Problem Relation Age of Onset  . Diabetes Mother   . Hyperlipidemia Mother   . Hypertension Mother   . Stroke Mother   . Coronary artery disease Mother     CABG  . Heart disease Mother   . Diabetes Father   . Hypertension Father   . Hyperlipidemia Father   . Coronary artery disease Father     CABG  . Heart disease Father     CHF  . Pancreatic cancer Father   . Heart disease Brother     arrhythmia  . Diabetes Maternal Grandmother   . Diabetes Maternal Grandfather   . Diabetes Paternal Grandmother   . Diabetes Paternal Grandfather   . Kidney disease Neg Hx    History  Substance Use Topics  . Smoking status: Never Smoker   . Smokeless tobacco: Never Used  .  Alcohol Use: No   OB History    No data available     Review of Systems  10 systems reviewed and found to be negative, except as noted in the HPI.   Allergies  Clindamycin/lincomycin; Erythromycin; Lipitor; and Penicillins  Home Medications   Prior to Admission medications   Medication Sig Start Date End Date Taking? Authorizing Provider  acetaminophen (TYLENOL) 500 MG tablet Take 1,000 mg by mouth every 6 (six) hours as needed.   Yes Historical Provider, MD  aspirin EC 81 MG EC tablet Take 1 tablet (81 mg total) by mouth daily. 07/25/13  Yes Sueanne Margarita, MD  citalopram (CELEXA) 20 MG tablet Take 1 tablet (20 mg total) by mouth daily. 09/16/14  Yes [name], MD  clopidogrel (PLAVIX) 75 MG tablet Take 1 tablet (75 mg total) by mouth daily. 10/29/14  Yes [name], MD  gabapentin (NEURONTIN) 100 MG capsule Take 1 capsule (100 mg total) by mouth at bedtime. 04/15/14  Yes [name], MD  insulin detemir (LEVEMIR) 100 UNIT/ML injection Inject 60 Units into the skin 2 (two) times daily.   Yes Historical Provider, MD  insulin NPH-regular Human (NOVOLIN 70/30) (70-30) 100 UNIT/ML injection Inject 20 Units into the skin 3 (three) times daily with meals.  05/14/14  Yes [name], MD  lisinopril (ZESTRIL) 2.5 MG tablet Take 1 tablet (2.5 mg total) by mouth daily. 10/07/14  Yes [name], MD  metFORMIN (GLUCOPHAGE) 1000 MG tablet Take 1 tablet (1,000 mg total) by mouth 2 (two) times daily with a meal. 08/14/14  Yes [name], MD  methocarbamol (ROBAXIN) 500 MG tablet Take 1 tablet (500 mg total) by mouth 3 (three) times daily as needed for muscle spasms. 11/11/14  Yes [name], MD  metoprolol tartrate (LOPRESSOR) 25 MG tablet Take 0.5 tablets (12.5 mg total) by mouth 2 (two) times daily. 10/23/14  Yes [name], MD  nitroGLYCERIN (NITROSTAT) 0.4 MG SL tablet Place 1 tablet (0.4 mg total) under the tongue every 5 (five) minutes as needed for chest pain. 07/25/13  Yes Brittainy Erie Noe, PA-C  pantoprazole (PROTONIX) 40 MG tablet Take 1 tablet (40 mg total) by mouth daily. 08/05/14  Yes [name], MD  promethazine (PHENERGAN) 25 MG tablet Take 25 mg by mouth every 6 (six) hours as needed for nausea or vomiting.   Yes Historical Provider, MD  Simethicone (GAS-X PO) Take 2 tablets by mouth 4 (four) times daily as needed (gas).   Yes Historical Provider, MD  simvastatin (ZOCOR) 40 MG tablet TAKE 1 TABLET (40 MG TOTAL) BY MOUTH DAILY. 08/04/14  Yes [name], MD  antipyrine-benzocaine Toniann Fail) otic solution Place 3-4 drops into the right ear every 2 (two) hours as needed for ear pain. Patient not taking:  Reported on 11/26/2014 12/14/13   Lutricia Feil, PA  furosemide (LASIX) 40 MG tablet Take 0.5 tablets (20 mg total) by mouth daily as needed for edema. 06/24/14   [name], MD  HYDROcodone-acetaminophen (NORCO/VICODIN) 5-325 MG per tablet Take 1 tablet by mouth every 6 (six) hours as needed for severe pain. Patient not taking: Reported on 11/26/2014 10/10/14   Etta Quill, NP  loperamide (IMODIUM) 2 MG capsule Take 1 capsule (2 mg total) by mouth 4 (four) times daily as needed for diarrhea or loose stools. 11/26/14   Marye Eagen, PA-C  potassium chloride (K-DUR,KLOR-CON) 10 MEQ tablet Take 1 tablet (10 mEq total) by mouth daily as needed. With lasix 06/24/14  [name], MD  promethazine (PHENERGAN) 25 MG tablet Take 1 tablet (25 mg total) by mouth every 6 (six) hours as needed for nausea or vomiting. 11/26/14   Elmyra Ricks Graviel Payeur, PA-C  traMADol (ULTRAM) 50 MG tablet Take 1-2 tablets (50-100 mg total) by mouth every 12 (twelve) hours as needed for moderate pain. Patient not taking: Reported on 11/26/2014 07/21/14   [name], MD   BP 119/79 mmHg  Pulse 64  Temp(Src) 98 F (36.7 C) (Oral)  Resp 18  Ht 5' (1.524 m)  Wt 182 lb (82.555 kg)  BMI 35.54 kg/m2  SpO2 96% Physical Exam  Constitutional: She is oriented to person, place, and time. She appears well-developed and well-nourished. No distress.  HENT:  Head: Normocephalic.  Mouth/Throat: Oropharynx is clear and moist.  Eyes: Conjunctivae and EOM are normal. Pupils are equal, round, and reactive to light.  Neck: Normal range of motion.  Cardiovascular: Normal rate, regular rhythm and intact distal pulses.   Pulmonary/Chest: Effort normal and breath sounds normal. No stridor. No respiratory distress. She has no wheezes. She has no rales. She exhibits no tenderness.  Abdominal: Soft. Bowel sounds are normal. She exhibits no distension and no mass. There is no tenderness. There is no rebound and no guarding.   Musculoskeletal: Normal range of motion.  Neurological: She is alert and oriented to person, place, and time.  Psychiatric: She has a normal mood and affect.  Nursing note and vitals reviewed.   ED Course  Procedures (including critical care time) Labs Review Labs Reviewed  COMPREHENSIVE METABOLIC PANEL - Abnormal; Notable for the following:    Sodium 131 (*)    Chloride 96 (*)    Glucose, Bld 358 (*)    All other components within normal limits  CBC WITH DIFFERENTIAL/PLATELET  LIPASE, BLOOD    Imaging Review No results found.   EKG Interpretation None      MDM   Final diagnoses:  Nausea without vomiting  Diarrhea  Hyperglycemia without ketosis    Filed Vitals:   11/26/14 0953 11/26/14 0954 11/26/14 1100  BP:  129/62 119/79  Pulse:  62 64  Temp:  98 F (36.7 C)   TempSrc:  Oral   Resp:  18 18  Height: 5' (1.524 m)    Weight: 182 lb (82.555 kg)    SpO2:  96% 96%    Medications  sodium chloride 0.9 % bolus 1,000 mL (1,000 mLs Intravenous New Bag/Given 11/26/14 1020)  ondansetron (ZOFRAN) injection 4 mg (4 mg Intravenous Given 11/26/14 1020)    Karen [name] is a pleasant 59 y.o. female presenting with nausea vomiting diarrhea, afebrile and clinically well-hydrated with no abnormalities in vital signs. Abdominal exam is nonsurgical. Will check basic blood work, hydrate and provide work note as requested.  Repeat abdominal exam remains benign. Mild hyponatremia at 131. Her glucose is elevated at 358 with a normal anion gap. Patient has history of poorly controlled type 2 diabetes. Eyes her to follow closely with her PCP and be compliant with her diabetic medications.  Discussed case with attending physician who agrees with care plan and disposition.   Evaluation does not show pathology that would require ongoing emergent intervention or inpatient treatment. Pt is hemodynamically stable and mentating appropriately. Discussed findings and plan with  patient/guardian, who agrees with care plan. All questions answered. Return precautions discussed and outpatient follow up given.   New Prescriptions   LOPERAMIDE (IMODIUM) 2 MG CAPSULE    Take 1 capsule (2  mg total) by mouth 4 (four) times daily as needed for diarrhea or loose stools.   PROMETHAZINE (PHENERGAN) 25 MG TABLET    Take 1 tablet (25 mg total) by mouth every 6 (six) hours as needed for nausea or vomiting.         Monico Blitz, PA-C 11/26/14 [place], MD 11/28/14 [phone]

## 2014-12-03 ENCOUNTER — Other Ambulatory Visit: Payer: Self-pay | Admitting: *Deleted

## 2014-12-03 MED ORDER — METHOCARBAMOL 500 MG PO TABS: 500.0000 mg | ORAL_TABLET | Freq: Three times a day (TID) | ORAL | Status: DC | PRN

## 2014-12-03 NOTE — Telephone Encounter (Signed)
Last office visit 05/28/2014.  Last filled on 11/11/2014 for #30 with no refills.  See MyChart message from 11/08/2014.  Ok to refill?

## 2014-12-16 ENCOUNTER — Other Ambulatory Visit: Payer: Self-pay | Admitting: *Deleted

## 2014-12-16 MED ORDER — GABAPENTIN 100 MG PO CAPS: 100.0000 mg | ORAL_CAPSULE | Freq: Every day | ORAL | Status: DC

## 2014-12-31 ENCOUNTER — Other Ambulatory Visit: Payer: Self-pay | Admitting: *Deleted

## 2014-12-31 MED ORDER — SIMVASTATIN 40 MG PO TABS: ORAL_TABLET | ORAL | Status: DC

## 2015-01-02 ENCOUNTER — Telehealth: Payer: Self-pay | Admitting: Family Medicine

## 2015-01-02 MED ORDER — TRAMADOL HCL 50 MG PO TABS: 50.0000 mg | ORAL_TABLET | Freq: Two times a day (BID) | ORAL | Status: DC | PRN

## 2015-01-02 NOTE — Telephone Encounter (Signed)
Medication phoned to pharmacy and left VM for patient at [facility] number listed.

## 2015-01-02 NOTE — Telephone Encounter (Signed)
May phone in tramadol and rec she schedule f/u with dentist

## 2015-01-02 NOTE — Telephone Encounter (Signed)
Pt called developing tooth ache over the night and wanted to know if Dr. Darnell Level could call her in something over the weekend? CB Number [phone]

## 2015-01-12 ENCOUNTER — Telehealth: Payer: Self-pay

## 2015-01-12 NOTE — Telephone Encounter (Signed)
Left a message reminding patient of being due for a Mammogram. I notified patient that if she needed any information on how/where to get one scheduled, she could return call to the office.

## 2015-01-23 ENCOUNTER — Other Ambulatory Visit: Payer: Self-pay | Admitting: *Deleted

## 2015-01-23 MED ORDER — METOPROLOL TARTRATE 25 MG PO TABS: 12.5000 mg | ORAL_TABLET | Freq: Two times a day (BID) | ORAL | Status: DC

## 2015-01-29 ENCOUNTER — Other Ambulatory Visit: Payer: Self-pay | Admitting: *Deleted

## 2015-01-29 MED ORDER — CLOPIDOGREL BISULFATE 75 MG PO TABS: 75.0000 mg | ORAL_TABLET | Freq: Every day | ORAL | Status: DC

## 2015-02-01 ENCOUNTER — Emergency Department (HOSPITAL_COMMUNITY)
Admission: EM | Admit: 2015-02-01 | Discharge: 2015-02-01 | Disposition: A | Discharge status: Home / Self Care | Payer: 59 | Attending: Emergency Medicine | Admitting: Emergency Medicine

## 2015-02-01 ENCOUNTER — Encounter (HOSPITAL_COMMUNITY): Payer: Self-pay | Admitting: Nurse Practitioner

## 2015-02-01 ENCOUNTER — Emergency Department (HOSPITAL_COMMUNITY): Payer: 59

## 2015-02-01 DIAGNOSIS — B349 Viral infection, unspecified: Secondary | ICD-10-CM | POA: Diagnosis not present

## 2015-02-01 DIAGNOSIS — Z8619 Personal history of other infectious and parasitic diseases: Secondary | ICD-10-CM | POA: Insufficient documentation

## 2015-02-01 DIAGNOSIS — Z880 Allergy status to penicillin: Secondary | ICD-10-CM | POA: Diagnosis not present

## 2015-02-01 DIAGNOSIS — Z7902 Long term (current) use of antithrombotics/antiplatelets: Secondary | ICD-10-CM | POA: Insufficient documentation

## 2015-02-01 DIAGNOSIS — Z8742 Personal history of other diseases of the female genital tract: Secondary | ICD-10-CM | POA: Diagnosis not present

## 2015-02-01 DIAGNOSIS — R6883 Chills (without fever): Secondary | ICD-10-CM

## 2015-02-01 DIAGNOSIS — Z7982 Long term (current) use of aspirin: Secondary | ICD-10-CM | POA: Insufficient documentation

## 2015-02-01 DIAGNOSIS — Z79899 Other long term (current) drug therapy: Secondary | ICD-10-CM | POA: Diagnosis not present

## 2015-02-01 DIAGNOSIS — E119 Type 2 diabetes mellitus without complications: Secondary | ICD-10-CM | POA: Insufficient documentation

## 2015-02-01 DIAGNOSIS — F329 Major depressive disorder, single episode, unspecified: Secondary | ICD-10-CM | POA: Diagnosis not present

## 2015-02-01 DIAGNOSIS — I2510 Atherosclerotic heart disease of native coronary artery without angina pectoris: Secondary | ICD-10-CM | POA: Diagnosis not present

## 2015-02-01 DIAGNOSIS — E785 Hyperlipidemia, unspecified: Secondary | ICD-10-CM | POA: Diagnosis not present

## 2015-02-01 DIAGNOSIS — G40909 Epilepsy, unspecified, not intractable, without status epilepticus: Secondary | ICD-10-CM | POA: Insufficient documentation

## 2015-02-01 DIAGNOSIS — K219 Gastro-esophageal reflux disease without esophagitis: Secondary | ICD-10-CM | POA: Diagnosis not present

## 2015-02-01 DIAGNOSIS — R52 Pain, unspecified: Secondary | ICD-10-CM

## 2015-02-01 DIAGNOSIS — Z794 Long term (current) use of insulin: Secondary | ICD-10-CM | POA: Diagnosis not present

## 2015-02-01 LAB — URINALYSIS, ROUTINE W REFLEX MICROSCOPIC
Bilirubin Urine: NEGATIVE
Glucose, UA: >1000 mg/dL — AB
Hgb urine dipstick: NEGATIVE
Ketones, ur: 15 mg/dL — AB
Leukocytes, UA: NEGATIVE
NITRITE: NEGATIVE
Protein, ur: NEGATIVE mg/dL
SPECIFIC GRAVITY, URINE: 1.042 — AB (ref 1.005–1.030)
UROBILINOGEN UA: 0.2 mg/dL (ref 0.0–1.0)
pH: 5 (ref 5.0–8.0)

## 2015-02-01 LAB — COMPREHENSIVE METABOLIC PANEL
ALK PHOS: 74 U/L (ref 38–126)
ALT: 24 U/L (ref 14–54)
ANION GAP: 13 (ref 5–15)
AST: 30 U/L (ref 15–41)
Albumin: 3.8 g/dL (ref 3.5–5.0)
BILIRUBIN TOTAL: 1.2 mg/dL (ref 0.3–1.2)
BUN: 12 mg/dL (ref 6–20)
CALCIUM: 9.6 mg/dL (ref 8.9–10.3)
CO2: 20 mmol/L — ABNORMAL LOW (ref 22–32)
Chloride: 98 mmol/L — ABNORMAL LOW (ref 101–111)
Creatinine, Ser: 0.66 mg/dL (ref 0.44–1.00)
Glucose, Bld: 395 mg/dL — ABNORMAL HIGH (ref 65–99)
POTASSIUM: 4.3 mmol/L (ref 3.5–5.1)
Sodium: 131 mmol/L — ABNORMAL LOW (ref 135–145)
TOTAL PROTEIN: 7.8 g/dL (ref 6.5–8.1)

## 2015-02-01 LAB — CBC
HEMATOCRIT: 46.1 % — AB (ref 36.0–46.0)
HEMOGLOBIN: 15.8 g/dL — AB (ref 12.0–15.0)
MCH: 28.3 pg (ref 26.0–34.0)
MCHC: 34.3 g/dL (ref 30.0–36.0)
MCV: 82.5 fL (ref 78.0–100.0)
Platelets: 190 10*3/uL (ref 150–400)
RBC: 5.59 MIL/uL — AB (ref 3.87–5.11)
RDW: 13.1 % (ref 11.5–15.5)
WBC: 5 10*3/uL (ref 4.0–10.5)

## 2015-02-01 LAB — I-STAT VENOUS BLOOD GAS, ED
Acid-base deficit: 4 mmol/L — ABNORMAL HIGH (ref 0.0–2.0)
Bicarbonate: 20.8 mEq/L (ref 20.0–24.0)
O2 Saturation: 78 %
TCO2: 22 mmol/L (ref 0–100)
pCO2, Ven: 37.6 mmHg — ABNORMAL LOW (ref 45.0–50.0)
pH, Ven: 7.35 — ABNORMAL HIGH (ref 7.250–7.300)
pO2, Ven: 44 mmHg (ref 30.0–45.0)

## 2015-02-01 LAB — URINE MICROSCOPIC-ADD ON

## 2015-02-01 LAB — CBG MONITORING, ED: Glucose-Capillary: 295 mg/dL — ABNORMAL HIGH (ref 65–99)

## 2015-02-01 MED ORDER — SODIUM CHLORIDE 0.9 % IV BOLUS (SEPSIS)
1000.0000 mL | Freq: Once | INTRAVENOUS | Status: AC
  Administered 2015-02-01: 1000 mL via INTRAVENOUS

## 2015-02-01 MED ORDER — PROMETHAZINE HCL 25 MG PO TABS: 25.0000 mg | ORAL_TABLET | Freq: Three times a day (TID) | ORAL | Status: DC | PRN

## 2015-02-01 NOTE — ED Notes (Signed)
Pt ambulated to the restroom with a stead gait.

## 2015-02-01 NOTE — ED Provider Notes (Signed)
CSN: [id number]     Arrival date & time 02/01/15  1250 History   First MD Initiated Contact with Patient 02/01/15 1621     Chief Complaint  Patient presents with  . Chills     (Consider location/radiation/quality/duration/timing/severity/associated sxs/prior Treatment) HPI Patient presents to the emergency department with chills and body aches that started yesterday.  The patient's a she has also had some nausea.  The patient denies chest pain, shortness of breath, cough, runny nose, sore throat, abdominal pain, dysuria, incontinence, bloody stool, hematuria, hematemesis, headache, blurred vision, dizziness, or syncope.  The patient states that she did not take any medications prior to arrival.  She is not documented any fevers at home.  Patient states nothing seems make her condition better or worse Past Medical History  Diagnosis Date  . History of chicken pox   . Depression   . Type 2 diabetes, uncontrolled, with neuropathy     dx 1993  . Chronic bronchitis   . Syncopal episodes   . GERD (gastroesophageal reflux disease)   . Seasonal allergies   . High blood pressure   . HLD (hyperlipidemia)   . History of ovarian cyst   . Tubal pregnancy   . Tachyarrhythmia     ER visits with adenosine push to control  . CAD (coronary artery disease)     STEMI 07/2013 - treated with PTCA of the RCA followed by DES to the intermediate ramus  . Seizure 1977    off AEDs   Past Surgical History  Procedure Laterality Date  . Tonsillectomy and adenoidectomy  1965  . Ovarian cyst surgery  1998  . Dilation and curettage of uterus  1984  . Ectopic pregnancy surgery Right 1984    tube removal 2/2 hemorrhage  . Rotator cuff repair  2006    Right  . Cardiac catheterization  07/2013    angioplasty to RCA and DES to ramus intermedius  . Left heart catheterization with coronary angiogram N/A 07/23/2013    Procedure: LEFT HEART CATHETERIZATION WITH CORONARY ANGIOGRAM;  Surgeon: Sinclair Grooms, MD;   Location: [facility] CATH LAB;  Service: Cardiovascular;  Laterality: N/A;  . Percutaneous coronary stent intervention (pci-s) N/A 07/24/2013    Procedure: PERCUTANEOUS CORONARY STENT INTERVENTION (PCI-S);  Surgeon: Sinclair Grooms, MD;  Location: [facility] CATH LAB;  Service: Cardiovascular;  Laterality: N/A;  . Submandibular duct ligation Right 10/2014    R submandibular gland stone removed Erik Obey)   Family History  Problem Relation Age of Onset  . Diabetes Mother   . Hyperlipidemia Mother   . Hypertension Mother   . Stroke Mother   . Coronary artery disease Mother     CABG  . Heart disease Mother   . Diabetes Father   . Hypertension Father   . Hyperlipidemia Father   . Coronary artery disease Father     CABG  . Heart disease Father     CHF  . Pancreatic cancer Father   . Heart disease Brother     arrhythmia  . Diabetes Maternal Grandmother   . Diabetes Maternal Grandfather   . Diabetes Paternal Grandmother   . Diabetes Paternal Grandfather   . Kidney disease Neg Hx    Social History  Substance Use Topics  . Smoking status: Never Smoker   . Smokeless tobacco: Never Used  . Alcohol Use: No   OB History    No data available     Review of Systems  All other systems negative  except as documented in the HPI. All pertinent positives and negatives as reviewed in the HPI.  Allergies  Clindamycin/lincomycin; Erythromycin; Lipitor; and Penicillins  Home Medications   Prior to Admission medications   Medication Sig Start Date End Date Taking? Authorizing Gari Hartsell  acetaminophen (TYLENOL) 500 MG tablet Take 1,000 mg by mouth every 6 (six) hours as needed.   Yes Historical Sydne Krahl, MD  aspirin EC 81 MG EC tablet Take 1 tablet (81 mg total) by mouth daily. 07/25/13  Yes Sueanne Margarita, MD  citalopram (CELEXA) 20 MG tablet Take 1 tablet (20 mg total) by mouth daily. 09/16/14  Yes [name], MD  clopidogrel (PLAVIX) 75 MG tablet Take 1 tablet (75 mg total) by mouth daily. 01/29/15   Yes [name], MD  furosemide (LASIX) 40 MG tablet Take 0.5 tablets (20 mg total) by mouth daily as needed for edema. 06/24/14  Yes [name], MD  gabapentin (NEURONTIN) 100 MG capsule Take 1 capsule (100 mg total) by mouth at bedtime. 12/16/14  Yes [name], MD  insulin detemir (LEVEMIR) 100 UNIT/ML injection Inject 60 Units into the skin 2 (two) times daily.   Yes Historical Shineka Auble, MD  insulin NPH-regular Human (NOVOLIN 70/30) (70-30) 100 UNIT/ML injection Inject 20 Units into the skin 3 (three) times daily with meals.  05/14/14  Yes [name], MD  lisinopril (ZESTRIL) 2.5 MG tablet Take 1 tablet (2.5 mg total) by mouth daily. 10/07/14  Yes [name], MD  metFORMIN (GLUCOPHAGE) 1000 MG tablet Take 1 tablet (1,000 mg total) by mouth 2 (two) times daily with a meal. 08/14/14  Yes [name], MD  methocarbamol (ROBAXIN) 500 MG tablet Take 1 tablet (500 mg total) by mouth 3 (three) times daily as needed for muscle spasms. 12/03/14  Yes Lucille Passy, MD  metoprolol tartrate (LOPRESSOR) 25 MG tablet Take 0.5 tablets (12.5 mg total) by mouth 2 (two) times daily. 01/23/15  Yes [name], MD  nitroGLYCERIN (NITROSTAT) 0.4 MG SL tablet Place 1 tablet (0.4 mg total) under the tongue every 5 (five) minutes as needed for chest pain. 07/25/13  Yes Brittainy Erie Noe, PA-C  pantoprazole (PROTONIX) 40 MG tablet Take 1 tablet (40 mg total) by mouth daily. 08/05/14  Yes [name], MD  potassium chloride (K-DUR,KLOR-CON) 10 MEQ tablet Take 1 tablet (10 mEq total) by mouth daily as needed. With lasix 06/24/14  Yes [name], MD  promethazine (PHENERGAN) 25 MG tablet Take 1 tablet (25 mg total) by mouth every 6 (six) hours as needed for nausea or vomiting. 11/26/14  Yes Nicole Pisciotta, PA-C  simvastatin (ZOCOR) 40 MG tablet TAKE 1 TABLET (40 MG TOTAL) BY MOUTH DAILY. 12/31/14  Yes [name], MD  traMADol (ULTRAM) 50 MG tablet Take 1-2 tablets (50-100 mg total)  by mouth every 12 (twelve) hours as needed for moderate pain. 01/02/15  Yes [name], MD  antipyrine-benzocaine Toniann Fail) otic solution Place 3-4 drops into the right ear every 2 (two) hours as needed for ear pain. Patient not taking: Reported on 11/26/2014 12/14/13   Lutricia Feil, PA  HYDROcodone-acetaminophen (NORCO/VICODIN) 5-325 MG per tablet Take 1 tablet by mouth every 6 (six) hours as needed for severe pain. Patient not taking: Reported on 11/26/2014 10/10/14   Etta Quill, NP  loperamide (IMODIUM) 2 MG capsule Take 1 capsule (2 mg total) by mouth 4 (four) times daily as needed for diarrhea or loose stools. 11/26/14   Nicole Pisciotta, PA-C   BP 150/85 mmHg  Pulse 97  Temp(Src) 97.7 F (36.5 C) (Oral)  Resp 16  Ht 5\' 1"  (1.549 m)  Wt 179 lb (81.194 kg)  BMI 33.84 kg/m2  SpO2 94% Physical Exam  Constitutional: She is oriented to person, place, and time. She appears well-developed and well-nourished. No distress.  HENT:  Head: Normocephalic and atraumatic.  Mouth/Throat: Oropharynx is clear and moist.  Eyes: Pupils are equal, round, and reactive to light.  Neck: Normal range of motion. Neck supple.  Cardiovascular: Normal rate, regular rhythm and normal heart sounds.  Exam reveals no gallop and no friction rub.   No murmur heard. Pulmonary/Chest: Effort normal and breath sounds normal. No respiratory distress. She has no wheezes.  Abdominal: Soft. Bowel sounds are normal. She exhibits no distension. There is no tenderness.  Neurological: She is alert and oriented to person, place, and time. She exhibits normal muscle tone. Coordination normal.  Skin: Skin is warm and dry. No rash noted. No erythema.  Psychiatric: She has a normal mood and affect. Her behavior is normal.  Nursing note and vitals reviewed.   ED Course  Procedures (including critical care time) Labs Review Labs Reviewed  COMPREHENSIVE METABOLIC PANEL - Abnormal; Notable for the following:    Sodium  131 (*)    Chloride 98 (*)    CO2 20 (*)    Glucose, Bld 395 (*)    All other components within normal limits  CBC - Abnormal; Notable for the following:    RBC 5.59 (*)    Hemoglobin 15.8 (*)    HCT 46.1 (*)    All other components within normal limits  URINALYSIS, ROUTINE W REFLEX MICROSCOPIC (NOT AT [facility]) - Abnormal; Notable for the following:    Specific Gravity, Urine 1.042 (*)    Glucose, UA >1000 (*)    Ketones, ur 15 (*)    All other components within normal limits  URINE MICROSCOPIC-ADD ON - Abnormal; Notable for the following:    Squamous Epithelial / LPF FEW (*)    All other components within normal limits  I-STAT VENOUS BLOOD GAS, ED - Abnormal; Notable for the following:    pH, Ven 7.350 (*)    pCO2, Ven 37.6 (*)    Acid-base deficit 4.0 (*)    All other components within normal limits  CBG MONITORING, ED - Abnormal; Notable for the following:    Glucose-Capillary 295 (*)    All other components within normal limits    Imaging Review Dg Chest 2 View  02/01/2015   CLINICAL DATA:  Cough, chills  EXAM: CHEST  2 VIEW  COMPARISON:  09/08/2013  FINDINGS: Cardiomediastinal silhouette is unremarkable. No acute infiltrate or pleural effusion. No pulmonary edema. Bony thorax is unremarkable.  IMPRESSION: No active cardiopulmonary disease.   Electronically Signed   By: Lahoma Crocker M.D.   On: 02/01/2015 17:46   I have personally reviewed and evaluated these images and lab results as part of my medical decision-making.    Patient will be advised to return here as needed.  She will be given antiemetics because she said some nausea.  The patient does not have any signs of infection at this time.  I told her this could be an evolving process and she will need to return here for any worsening in her condition.  Patient agrees the plan and all questions were answered  Dalia Heading, PA-C 02/02/15 0158  Charlesetta Shanks, MD 02/03/15 0000

## 2015-02-01 NOTE — Discharge Instructions (Signed)
Return here as needed.  Follow-up with your primary care doctor, increase your fluid intake, rest as much as possible °

## 2015-02-01 NOTE — ED Notes (Addendum)
She reports generally not feeling well, nausea, aches, chills since yesterday. Denies vomiting, denies bowel/bladder changes. She tried phenergan for nausea which helped. She tried tylenol for the chills and body aches with minimal relief. Denies cough, sob. A&Ox4, resp e/u

## 2015-02-01 NOTE — ED Notes (Signed)
Pt off unit with Xray 

## 2015-02-02 ENCOUNTER — Ambulatory Visit: Payer: 59 | Admitting: Interventional Cardiology

## 2015-02-03 ENCOUNTER — Other Ambulatory Visit: Payer: Self-pay | Admitting: *Deleted

## 2015-02-05 ENCOUNTER — Encounter: Payer: Self-pay | Admitting: Family Medicine

## 2015-02-06 ENCOUNTER — Other Ambulatory Visit: Payer: Self-pay

## 2015-02-06 ENCOUNTER — Other Ambulatory Visit: Payer: Self-pay | Admitting: Family Medicine

## 2015-02-06 MED ORDER — TRAMADOL HCL 50 MG PO TABS: 50.0000 mg | ORAL_TABLET | Freq: Two times a day (BID) | ORAL | Status: DC | PRN

## 2015-02-06 NOTE — Telephone Encounter (Signed)
plz phone in and notify patient. 

## 2015-02-07 NOTE — Telephone Encounter (Signed)
Last office visit 05/28/2014. Ok to refill?

## 2015-02-09 MED ORDER — TRAMADOL HCL 50 MG PO TABS: 50.0000 mg | ORAL_TABLET | Freq: Two times a day (BID) | ORAL | Status: DC | PRN

## 2015-02-09 NOTE — Telephone Encounter (Signed)
plz phone in. 

## 2015-02-09 NOTE — Telephone Encounter (Signed)
Rx called in as directed and patient notified via [facility].

## 2015-02-09 NOTE — Telephone Encounter (Signed)
Rx called in as directed.   

## 2015-02-10 ENCOUNTER — Other Ambulatory Visit: Payer: Self-pay | Admitting: *Deleted

## 2015-02-10 MED ORDER — INSULIN DETEMIR 100 UNIT/ML ~~LOC~~ SOLN: 60.0000 [IU] | Freq: Two times a day (BID) | SUBCUTANEOUS | Status: DC

## 2015-02-26 ENCOUNTER — Ambulatory Visit: Payer: 59 | Admitting: Nurse Practitioner

## 2015-03-06 ENCOUNTER — Other Ambulatory Visit: Payer: Self-pay | Admitting: *Deleted

## 2015-03-06 MED ORDER — CITALOPRAM HYDROBROMIDE 20 MG PO TABS: 20.0000 mg | ORAL_TABLET | Freq: Every day | ORAL | Status: DC

## 2015-03-16 ENCOUNTER — Encounter: Payer: Self-pay | Admitting: Nurse Practitioner

## 2015-03-16 ENCOUNTER — Other Ambulatory Visit: Payer: Self-pay | Admitting: *Deleted

## 2015-03-16 MED ORDER — PANTOPRAZOLE SODIUM 40 MG PO TBEC: 40.0000 mg | DELAYED_RELEASE_TABLET | Freq: Every day | ORAL | Status: DC

## 2015-03-23 ENCOUNTER — Emergency Department (HOSPITAL_COMMUNITY)
Admission: EM | Admit: 2015-03-23 | Discharge: 2015-03-23 | Disposition: A | Discharge status: Home / Self Care | Payer: 59 | Attending: Emergency Medicine | Admitting: Emergency Medicine

## 2015-03-23 ENCOUNTER — Encounter (HOSPITAL_COMMUNITY): Payer: Self-pay | Admitting: *Deleted

## 2015-03-23 ENCOUNTER — Emergency Department (HOSPITAL_COMMUNITY): Payer: 59

## 2015-03-23 DIAGNOSIS — Z79899 Other long term (current) drug therapy: Secondary | ICD-10-CM | POA: Insufficient documentation

## 2015-03-23 DIAGNOSIS — K219 Gastro-esophageal reflux disease without esophagitis: Secondary | ICD-10-CM | POA: Insufficient documentation

## 2015-03-23 DIAGNOSIS — E119 Type 2 diabetes mellitus without complications: Secondary | ICD-10-CM | POA: Diagnosis not present

## 2015-03-23 DIAGNOSIS — F329 Major depressive disorder, single episode, unspecified: Secondary | ICD-10-CM | POA: Diagnosis not present

## 2015-03-23 DIAGNOSIS — R05 Cough: Secondary | ICD-10-CM | POA: Insufficient documentation

## 2015-03-23 DIAGNOSIS — I2510 Atherosclerotic heart disease of native coronary artery without angina pectoris: Secondary | ICD-10-CM | POA: Insufficient documentation

## 2015-03-23 DIAGNOSIS — Z7902 Long term (current) use of antithrombotics/antiplatelets: Secondary | ICD-10-CM | POA: Insufficient documentation

## 2015-03-23 DIAGNOSIS — Z7982 Long term (current) use of aspirin: Secondary | ICD-10-CM | POA: Insufficient documentation

## 2015-03-23 DIAGNOSIS — Z880 Allergy status to penicillin: Secondary | ICD-10-CM | POA: Diagnosis not present

## 2015-03-23 DIAGNOSIS — E785 Hyperlipidemia, unspecified: Secondary | ICD-10-CM | POA: Insufficient documentation

## 2015-03-23 DIAGNOSIS — R059 Cough, unspecified: Secondary | ICD-10-CM

## 2015-03-23 DIAGNOSIS — Z8619 Personal history of other infectious and parasitic diseases: Secondary | ICD-10-CM | POA: Insufficient documentation

## 2015-03-23 DIAGNOSIS — Z794 Long term (current) use of insulin: Secondary | ICD-10-CM | POA: Diagnosis not present

## 2015-03-23 MED ORDER — HYDROCODONE-HOMATROPINE 5-1.5 MG/5ML PO SYRP: 5.0000 mL | ORAL_SOLUTION | Freq: Four times a day (QID) | ORAL | Status: DC | PRN

## 2015-03-23 NOTE — ED Provider Notes (Signed)
CSN: [id number]     Arrival date & time 03/23/15  7425 History  By signing my name below, I, Essence Howell, attest that this documentation has been prepared under the direction and in the presence of Ottie Glazier, PA-C Electronically Signed: Ladene Artist, ED Scribe 03/24/2015 at 12:45 PM.   Chief Complaint  Patient presents with  . Cough   The history is provided by the patient. No language interpreter was used.   HPI Comments: Karen [name] is a 59 y.o. female, with a h/o HTN and DM, who presents to the Emergency Department complaining of persistent dry cough for the past month, worsened over the past week. Pt reports associated chest congestion. She has tried cough syrup without relief. She denies fever and any other symptoms at this time. Pt reports h/o PNA and bronchitis. She denies h/o tobacco use. No h/o COPD or asthma.   Past Medical History  Diagnosis Date  . History of chicken pox   . Depression   . Type 2 diabetes, uncontrolled, with neuropathy ([place])     dx 1993  . Chronic bronchitis   . Syncopal episodes   . GERD (gastroesophageal reflux disease)   . Seasonal allergies   . High blood pressure   . HLD (hyperlipidemia)   . History of ovarian cyst   . Tubal pregnancy   . Tachyarrhythmia     ER visits with adenosine push to control  . CAD (coronary artery disease)     STEMI 07/2013 - treated with PTCA of the RCA followed by DES to the intermediate ramus  . Seizure ([place]) 1977    off AEDs   Past Surgical History  Procedure Laterality Date  . Tonsillectomy and adenoidectomy  1965  . Ovarian cyst surgery  1998  . Dilation and curettage of uterus  1984  . Ectopic pregnancy surgery Right 1984    tube removal 2/2 hemorrhage  . Rotator cuff repair  2006    Right  . Cardiac catheterization  07/2013    angioplasty to RCA and DES to ramus intermedius  . Left heart catheterization with coronary angiogram N/A 07/23/2013    Procedure: LEFT HEART CATHETERIZATION WITH CORONARY  ANGIOGRAM;  Surgeon: Sinclair Grooms, MD;  [facility]: [facility] CATH LAB;  Service: Cardiovascular;  Laterality: N/A;  . Percutaneous coronary stent intervention (pci-s) N/A 07/24/2013    Procedure: PERCUTANEOUS CORONARY STENT INTERVENTION (PCI-S);  Surgeon: Sinclair Grooms, MD;  Location: [facility] CATH LAB;  Service: Cardiovascular;  Laterality: N/A;  . Submandibular duct ligation Right 10/2014    R submandibular gland stone removed Erik Obey)   Family History  Problem Relation Age of Onset  . Diabetes Mother   . Hyperlipidemia Mother   . Hypertension Mother   . Stroke Mother   . Coronary artery disease Mother     CABG  . Heart disease Mother   . Diabetes Father   . Hypertension Father   . Hyperlipidemia Father   . Coronary artery disease Father     CABG  . Heart disease Father     CHF  . Pancreatic cancer Father   . Heart disease Brother     arrhythmia  . Diabetes Maternal Grandmother   . Diabetes Maternal Grandfather   . Diabetes Paternal Grandmother   . Diabetes Paternal Grandfather   . Kidney disease Neg Hx    Social History  Substance Use Topics  . Smoking status: Never Smoker   . Smokeless tobacco: Never Used  . Alcohol  Use: No   OB History    No data available     Review of Systems  Constitutional: Negative for fever.  Respiratory: Positive for cough.    Allergies  Clindamycin/lincomycin; Erythromycin; Lipitor; and Penicillins  Home Medications   Prior to Admission medications   Medication Sig Start Date End Date Taking? Authorizing Provider  acetaminophen (TYLENOL) 500 MG tablet Take 1,000 mg by mouth every 6 (six) hours as needed.    Historical Provider, MD  antipyrine-benzocaine Toniann Fail) otic solution Place 3-4 drops into the right ear every 2 (two) hours as needed for ear pain. Patient not taking: Reported on 11/26/2014 12/14/13   Lutricia Feil, PA  aspirin EC 81 MG EC tablet Take 1 tablet (81 mg total) by mouth daily. 07/25/13   Sueanne Margarita, MD   citalopram (CELEXA) 20 MG tablet Take 1 tablet (20 mg total) by mouth daily. 03/06/15   [name], MD  clopidogrel (PLAVIX) 75 MG tablet Take 1 tablet (75 mg total) by mouth daily. 01/29/15   [name], MD  furosemide (LASIX) 40 MG tablet Take 0.5 tablets (20 mg total) by mouth daily as needed for edema. 06/24/14   [name], MD  gabapentin (NEURONTIN) 100 MG capsule Take 1 capsule (100 mg total) by mouth at bedtime. 12/16/14   [name], MD  HYDROcodone-acetaminophen (NORCO/VICODIN) 5-325 MG per tablet Take 1 tablet by mouth every 6 (six) hours as needed for severe pain. Patient not taking: Reported on 11/26/2014 10/10/14   Etta Quill, NP  HYDROcodone-homatropine [facility]) 5-1.5 MG/5ML syrup Take 5 mLs by mouth every 6 (six) hours as needed for cough. 03/23/15   Leianna Barga Patel-Mills, PA-C  insulin detemir (LEVEMIR) 100 UNIT/ML injection Inject 0.6 mLs (60 Units total) into the skin 2 (two) times daily. 02/10/15   [name], MD  insulin NPH-regular Human (NOVOLIN 70/30) (70-30) 100 UNIT/ML injection Inject 20 Units into the skin 3 (three) times daily with meals.  05/14/14   [name], MD  lisinopril (ZESTRIL) 2.5 MG tablet Take 1 tablet (2.5 mg total) by mouth daily. 10/07/14   [name], MD  loperamide (IMODIUM) 2 MG capsule Take 1 capsule (2 mg total) by mouth 4 (four) times daily as needed for diarrhea or loose stools. 11/26/14   Nicole Pisciotta, PA-C  metFORMIN (GLUCOPHAGE) 1000 MG tablet Take 1 tablet (1,000 mg total) by mouth 2 (two) times daily with a meal. 08/14/14   [name], MD  methocarbamol (ROBAXIN) 500 MG tablet Take 1 tablet (500 mg total) by mouth 3 (three) times daily as needed for muscle spasms. 12/03/14   Lucille Passy, MD  metoprolol tartrate (LOPRESSOR) 25 MG tablet Take 0.5 tablets (12.5 mg total) by mouth 2 (two) times daily. 01/23/15   [name], MD  nitroGLYCERIN (NITROSTAT) 0.4 MG SL tablet Place 1 tablet (0.4 mg total) under  the tongue every 5 (five) minutes as needed for chest pain. 07/25/13   Brittainy Erie Noe, PA-C  pantoprazole (PROTONIX) 40 MG tablet Take 1 tablet (40 mg total) by mouth daily. 03/16/15   [name], MD  potassium chloride (K-DUR,KLOR-CON) 10 MEQ tablet Take 1 tablet (10 mEq total) by mouth daily as needed. With lasix 06/24/14   [name], MD  promethazine (PHENERGAN) 25 MG tablet Take 1 tablet (25 mg total) by mouth every 8 (eight) hours as needed for nausea or vomiting. 02/01/15   Dalia Heading, PA-C  simvastatin (ZOCOR) 40 MG tablet TAKE 1 TABLET (40 MG TOTAL) BY  MOUTH DAILY. 12/31/14   [name], MD  traMADol (ULTRAM) 50 MG tablet Take 1-2 tablets (50-100 mg total) by mouth every 12 (twelve) hours as needed for moderate pain. 02/09/15   [name], MD   BP 129/53 mmHg  Pulse 73  Temp(Src) 98.5 F (36.9 C) (Oral)  Resp 16  SpO2 96% Physical Exam  Constitutional: She is oriented to person, place, and time. She appears well-developed and well-nourished. No distress.  HENT:  Head: Normocephalic and atraumatic.  Eyes: Conjunctivae and EOM are normal.  Neck: Neck supple. No tracheal deviation present.  Cardiovascular: Normal rate.   Pulmonary/Chest: Effort normal and breath sounds normal. No respiratory distress. She has no wheezes.  Lungs are clear to auscultation bilaterally. No respiratory distress.   Musculoskeletal: Normal range of motion.  Neurological: She is alert and oriented to person, place, and time.  Skin: Skin is warm and dry.  Psychiatric: She has a normal mood and affect. Her behavior is normal.  Nursing note and vitals reviewed.  ED Course  Procedures (including critical care time) DIAGNOSTIC STUDIES: Oxygen Saturation is 96% on RA, adequate by my interpretation.    COORDINATION OF CARE: 10:51 AM-Discussed treatment plan which includes CXR with pt at bedside and pt agreed to plan.   Labs Review Labs Reviewed - No data to  display  Imaging Review Dg Chest 2 View  03/23/2015  CLINICAL DATA:  Cough for 4 weeks, worsening over past few days. EXAM: CHEST  2 VIEW COMPARISON:  02/01/2015 FINDINGS: The heart size and mediastinal contours are within normal limits. Both lungs are clear. The visualized skeletal structures are unremarkable. IMPRESSION: No active cardiopulmonary disease. Electronically Signed   By: Rolm Baptise M.D.   On: 03/23/2015 12:42   I have personally reviewed and evaluated these image results as part of my medical decision-making.   EKG Interpretation None      MDM   Final diagnoses:  Cough  Patient presents for cough x 1 month.  She is well appearing and her exam is normal.  Her chest x-ray is negative for edema, infiltrate, or pneumothorax. I prescribed the patient Hycodan for cough. I discussed follow-up as well as return precautions and the patient verbally agreed with the plan. Medications - No data to display  I personally performed the services described in this documentation, which was scribed in my presence. The recorded information has been reviewed and is accurate.   Ottie Glazier, PA-C 03/24/15 [place], MD 03/25/15 1004

## 2015-03-23 NOTE — Discharge Instructions (Signed)
Cough, Adult Take Hycodan for cough. Follow up with your primary care physician. Return for fever or difficulty breathing. A cough helps to clear your throat and lungs. A cough may last only 2-3 weeks (acute), or it may last longer than 8 weeks (chronic). Many different things can cause a cough. A cough may be a sign of an illness or another medical condition. HOME CARE  Pay attention to any changes in your cough.  Take medicines only as told by your doctor.  If you were prescribed an antibiotic medicine, take it as told by your doctor. Do not stop taking it even if you start to feel better.  Talk with your doctor before you try using a cough medicine.  Drink enough fluid to keep your pee (urine) clear or pale yellow.  If the air is dry, use a cold steam vaporizer or humidifier in your home.  Stay away from things that make you cough at work or at home.  If your cough is worse at night, try using extra pillows to raise your head up higher while you sleep.  Do not smoke, and try not to be around smoke. If you need help quitting, ask your doctor.  Do not have caffeine.  Do not drink alcohol.  Rest as needed. GET HELP IF:  You have new problems (symptoms).  You cough up yellow fluid (pus).  Your cough does not get better after 2-3 weeks, or your cough gets worse.  Medicine does not help your cough and you are not sleeping well.  You have pain that gets worse or pain that is not helped with medicine.  You have a fever.  You are losing weight and you do not know why.  You have night sweats. GET HELP RIGHT AWAY IF:  You cough up blood.  You have trouble breathing.  Your heartbeat is very fast.   This information is not intended to replace advice given to you by your health care provider. Make sure you discuss any questions you have with your health care provider.   Document Released: 01/13/2011 Document Revised: 01/21/2015 Document Reviewed: 07/09/2014 Elsevier  Interactive Patient Education [employer].

## 2015-03-23 NOTE — ED Notes (Signed)
Pt reports non productive cough x 1 month, worse for the last week. No resp distress noted.

## 2015-03-25 ENCOUNTER — Encounter: Payer: Self-pay | Admitting: Family Medicine

## 2015-03-25 NOTE — Telephone Encounter (Signed)
plz call to schedule appt for patient.

## 2015-03-26 NOTE — Telephone Encounter (Signed)
Message left for patient to call and schedule appt

## 2015-03-28 ENCOUNTER — Encounter (HOSPITAL_COMMUNITY): Payer: Self-pay | Admitting: Nurse Practitioner

## 2015-03-28 ENCOUNTER — Emergency Department (HOSPITAL_COMMUNITY)
Admission: EM | Admit: 2015-03-28 | Discharge: 2015-03-28 | Disposition: A | Discharge status: Home / Self Care | Payer: 59 | Attending: Emergency Medicine | Admitting: Emergency Medicine

## 2015-03-28 DIAGNOSIS — Z79899 Other long term (current) drug therapy: Secondary | ICD-10-CM | POA: Insufficient documentation

## 2015-03-28 DIAGNOSIS — Z8742 Personal history of other diseases of the female genital tract: Secondary | ICD-10-CM | POA: Insufficient documentation

## 2015-03-28 DIAGNOSIS — I2510 Atherosclerotic heart disease of native coronary artery without angina pectoris: Secondary | ICD-10-CM | POA: Diagnosis not present

## 2015-03-28 DIAGNOSIS — F329 Major depressive disorder, single episode, unspecified: Secondary | ICD-10-CM | POA: Diagnosis not present

## 2015-03-28 DIAGNOSIS — E785 Hyperlipidemia, unspecified: Secondary | ICD-10-CM | POA: Diagnosis not present

## 2015-03-28 DIAGNOSIS — J029 Acute pharyngitis, unspecified: Secondary | ICD-10-CM | POA: Insufficient documentation

## 2015-03-28 DIAGNOSIS — Z8619 Personal history of other infectious and parasitic diseases: Secondary | ICD-10-CM | POA: Diagnosis not present

## 2015-03-28 DIAGNOSIS — E119 Type 2 diabetes mellitus without complications: Secondary | ICD-10-CM | POA: Diagnosis not present

## 2015-03-28 DIAGNOSIS — R067 Sneezing: Secondary | ICD-10-CM | POA: Insufficient documentation

## 2015-03-28 DIAGNOSIS — Z794 Long term (current) use of insulin: Secondary | ICD-10-CM | POA: Insufficient documentation

## 2015-03-28 DIAGNOSIS — Z7902 Long term (current) use of antithrombotics/antiplatelets: Secondary | ICD-10-CM | POA: Insufficient documentation

## 2015-03-28 DIAGNOSIS — Z7982 Long term (current) use of aspirin: Secondary | ICD-10-CM | POA: Diagnosis not present

## 2015-03-28 DIAGNOSIS — R059 Cough, unspecified: Secondary | ICD-10-CM

## 2015-03-28 DIAGNOSIS — K219 Gastro-esophageal reflux disease without esophagitis: Secondary | ICD-10-CM | POA: Insufficient documentation

## 2015-03-28 DIAGNOSIS — Z880 Allergy status to penicillin: Secondary | ICD-10-CM | POA: Diagnosis not present

## 2015-03-28 DIAGNOSIS — R05 Cough: Secondary | ICD-10-CM

## 2015-03-28 LAB — RAPID STREP SCREEN (MED CTR MEBANE ONLY): STREPTOCOCCUS, GROUP A SCREEN (DIRECT): NEGATIVE

## 2015-03-28 MED ORDER — BENZONATATE 100 MG PO CAPS: 100.0000 mg | ORAL_CAPSULE | Freq: Three times a day (TID) | ORAL | Status: DC | PRN

## 2015-03-28 MED ORDER — HYDROCODONE-HOMATROPINE 5-1.5 MG/5ML PO SYRP: 5.0000 mL | ORAL_SOLUTION | Freq: Four times a day (QID) | ORAL | Status: DC | PRN

## 2015-03-28 MED ORDER — ACETAMINOPHEN 500 MG PO TABS: 500.0000 mg | ORAL_TABLET | Freq: Four times a day (QID) | ORAL | Status: DC | PRN

## 2015-03-28 NOTE — ED Provider Notes (Signed)
CSN: [id number]     Arrival date & time 03/28/15  2022 History  By signing my name below, I, Irene Pap, attest that this documentation has been prepared under the direction and in the presence of [employer], PA-C. Electronically Signed: Irene Pap, ED Scribe. 03/28/2015. 10:44 PM.   Chief Complaint  Patient presents with  . Sore Throat  . Cough   The history is provided by the patient. No language interpreter was used.  HPI Comments: Karen [name] is a 59 y.o. Female with a hx of uncontrolled Type II DM with neuropathy, chronic bronchitis, seasonal allergies, CAD, HTN and seizures who presents to the Emergency Department complaining of gradually worsening cough onset 3 weeks ago, sneezing, sore throat that Karen [name] rates 10/10 and generalized myalgias onset 5 days ago. Pt was recently seen for the same symptoms but states that they have not improved. Karen [name] states that a chest x-ray was performed and was negative.Pt states that her discharge medications from her last visit has provided intermittent relief. Pt states that this cough feels like her past hx of bronchitis. Pt takes lisinopril, is on a blood thinner, and has not been treated with steroids for past hx of chronic cough because of her DM. Karen [name] denies congestion.   Past Medical History  Diagnosis Date  . History of chicken pox   . Depression   . Type 2 diabetes, uncontrolled, with neuropathy ([place])     dx 1993  . Chronic bronchitis   . Syncopal episodes   . GERD (gastroesophageal reflux disease)   . Seasonal allergies   . High blood pressure   . HLD (hyperlipidemia)   . History of ovarian cyst   . Tubal pregnancy   . Tachyarrhythmia     ER visits with adenosine push to control  . CAD (coronary artery disease)     STEMI 07/2013 - treated with PTCA of the RCA followed by DES to the intermediate ramus  . Seizure ([place]) 1977    off AEDs   Past Surgical History  Procedure Laterality Date  . Tonsillectomy and adenoidectomy  1965   . Ovarian cyst surgery  1998  . Dilation and curettage of uterus  1984  . Ectopic pregnancy surgery Right 1984    tube removal 2/2 hemorrhage  . Rotator cuff repair  2006    Right  . Cardiac catheterization  07/2013    angioplasty to RCA and DES to ramus intermedius  . Left heart catheterization with coronary angiogram N/A 07/23/2013    Procedure: LEFT HEART CATHETERIZATION WITH CORONARY ANGIOGRAM;  Surgeon: Sinclair Grooms, MD;  Location: [facility] CATH LAB;  Service: Cardiovascular;  Laterality: N/A;  . Percutaneous coronary stent intervention (pci-s) N/A 07/24/2013    Procedure: PERCUTANEOUS CORONARY STENT INTERVENTION (PCI-S);  Surgeon: Sinclair Grooms, MD;  Location: [facility] CATH LAB;  Service: Cardiovascular;  Laterality: N/A;  . Submandibular duct ligation Right 10/2014    R submandibular gland stone removed Erik Obey)   Family History  Problem Relation Age of Onset  . Diabetes Mother   . Hyperlipidemia Mother   . Hypertension Mother   . Stroke Mother   . Coronary artery disease Mother     CABG  . Heart disease Mother   . Diabetes Father   . Hypertension Father   . Hyperlipidemia Father   . Coronary artery disease Father     CABG  . Heart disease Father     CHF  . Pancreatic cancer Father   .  Heart disease Brother     arrhythmia  . Diabetes Maternal Grandmother   . Diabetes Maternal Grandfather   . Diabetes Paternal Grandmother   . Diabetes Paternal Grandfather   . Kidney disease Neg Hx    Social History  Substance Use Topics  . Smoking status: Never Smoker   . Smokeless tobacco: Never Used  . Alcohol Use: No   OB History    No data available     Review of Systems  HENT: Positive for sneezing and sore throat. Negative for congestion.   Respiratory: Positive for cough.   Musculoskeletal: Positive for myalgias.  All other systems reviewed and are negative.  Allergies  Clindamycin/lincomycin; Erythromycin; Lipitor; and Penicillins  Home Medications   Prior to  Admission medications   Medication Sig Start Date End Date Taking? Authorizing Provider  acetaminophen (TYLENOL) 500 MG tablet Take 1 tablet (500 mg total) by mouth every 6 (six) hours as needed. 03/28/15   Antonietta Breach, PA-C  antipyrine-benzocaine Toniann Fail) otic solution Place 3-4 drops into the right ear every 2 (two) hours as needed for ear pain. Patient not taking: Reported on 11/26/2014 12/14/13   Lutricia Feil, PA  aspirin EC 81 MG EC tablet Take 1 tablet (81 mg total) by mouth daily. 07/25/13   Sueanne Margarita, MD  benzonatate (TESSALON) 100 MG capsule Take 1 capsule (100 mg total) by mouth 3 (three) times daily as needed for cough. 03/28/15   Antonietta Breach, PA-C  citalopram (CELEXA) 20 MG tablet Take 1 tablet (20 mg total) by mouth daily. 03/06/15   [name], MD  clopidogrel (PLAVIX) 75 MG tablet Take 1 tablet (75 mg total) by mouth daily. 01/29/15   [name], MD  furosemide (LASIX) 40 MG tablet Take 0.5 tablets (20 mg total) by mouth daily as needed for edema. 06/24/14   [name], MD  gabapentin (NEURONTIN) 100 MG capsule Take 1 capsule (100 mg total) by mouth at bedtime. 12/16/14   [name], MD  HYDROcodone-acetaminophen (NORCO/VICODIN) 5-325 MG per tablet Take 1 tablet by mouth every 6 (six) hours as needed for severe pain. Patient not taking: Reported on 11/26/2014 10/10/14   Etta Quill, NP  HYDROcodone-homatropine [facility]) 5-1.5 MG/5ML syrup Take 5 mLs by mouth every 6 (six) hours as needed for cough. 03/28/15   Antonietta Breach, PA-C  insulin detemir (LEVEMIR) 100 UNIT/ML injection Inject 0.6 mLs (60 Units total) into the skin 2 (two) times daily. 02/10/15   [name], MD  insulin NPH-regular Human (NOVOLIN 70/30) (70-30) 100 UNIT/ML injection Inject 20 Units into the skin 3 (three) times daily with meals.  05/14/14   [name], MD  lisinopril (ZESTRIL) 2.5 MG tablet Take 1 tablet (2.5 mg total) by mouth daily. 10/07/14   [name], MD   loperamide (IMODIUM) 2 MG capsule Take 1 capsule (2 mg total) by mouth 4 (four) times daily as needed for diarrhea or loose stools. 11/26/14   Nicole Pisciotta, PA-C  metFORMIN (GLUCOPHAGE) 1000 MG tablet Take 1 tablet (1,000 mg total) by mouth 2 (two) times daily with a meal. 08/14/14   [name], MD  methocarbamol (ROBAXIN) 500 MG tablet Take 1 tablet (500 mg total) by mouth 3 (three) times daily as needed for muscle spasms. 12/03/14   Lucille Passy, MD  metoprolol tartrate (LOPRESSOR) 25 MG tablet Take 0.5 tablets (12.5 mg total) by mouth 2 (two) times daily. 01/23/15   [name], MD  nitroGLYCERIN (NITROSTAT) 0.4 MG SL tablet Place 1  tablet (0.4 mg total) under the tongue every 5 (five) minutes as needed for chest pain. 07/25/13   Brittainy Erie Noe, PA-C  pantoprazole (PROTONIX) 40 MG tablet Take 1 tablet (40 mg total) by mouth daily. 03/16/15   [name], MD  potassium chloride (K-DUR,KLOR-CON) 10 MEQ tablet Take 1 tablet (10 mEq total) by mouth daily as needed. With lasix 06/24/14   [name], MD  promethazine (PHENERGAN) 25 MG tablet Take 1 tablet (25 mg total) by mouth every 8 (eight) hours as needed for nausea or vomiting. 02/01/15   Dalia Heading, PA-C  simvastatin (ZOCOR) 40 MG tablet TAKE 1 TABLET (40 MG TOTAL) BY MOUTH DAILY. 12/31/14   [name], MD  traMADol (ULTRAM) 50 MG tablet Take 1-2 tablets (50-100 mg total) by mouth every 12 (twelve) hours as needed for moderate pain. 02/09/15   [name], MD   BP 139/75 mmHg  Pulse 91  Temp(Src) 97.9 F (36.6 C) (Oral)  Resp 18  SpO2 100% Physical Exam  Constitutional: Karen [name] is oriented to person, place, and time. Karen [name] appears well-developed and well-nourished. No distress.  Nontoxic/nonseptic appearing  HENT:  Head: Normocephalic and atraumatic.  Mouth/Throat: No oropharyngeal exudate.  Mild posterior oropharyngeal erythema. No edema or exudates. Patient tolerating secretions without difficulty. No  trismus.  Eyes: Conjunctivae and EOM are normal. No scleral icterus.  Neck: Normal range of motion.  No nuchal rigidity or meningismus  Cardiovascular: Normal rate, regular rhythm and intact distal pulses.   Pulmonary/Chest: Effort normal and breath sounds normal. No respiratory distress. Karen [name] has no wheezes. Karen [name] has no rales.  Lungs clear bilaterally. Chest expansion symmetric. No cough appreciated at bedside.   Musculoskeletal: Normal range of motion.  Neurological: Karen [name] is alert and oriented to person, place, and time. Karen [name] exhibits normal muscle tone. Coordination normal.  GCS 15. Patient ambulatory with steady gait.  Skin: Skin is warm and dry. No rash noted. Karen [name] is not diaphoretic. No erythema. No pallor.  Psychiatric: Karen [name] has a normal mood and affect. Her behavior is normal.  Nursing note and vitals reviewed.   ED Course  Procedures (including critical care time) DIAGNOSTIC STUDIES: Oxygen Saturation is 100% on RA, normal by my interpretation.    COORDINATION OF CARE: 10:41 PM-Discussed treatment plan which includes labs and follow up with PCP with pt at bedside and pt agreed to plan.    Labs Review Labs Reviewed  RAPID STREP SCREEN (NOT AT [facility])  CULTURE, GROUP A STREP    Imaging Review No results found. I have personally reviewed and evaluated these images and lab results as part of my medical decision-making.   EKG Interpretation None      MDM   Final diagnoses:  Cough  Pharyngitis    59 year old female presents to the emergency department for persistent cough. Patient had a negative chest x-ray 5 days ago. Karen [name] has no fever, tachypnea, dyspnea, or hypoxia to suggest pneumonia. Rapid strep screen is negative today. Symptoms may be secondary to a viral illness; however, have discussed with the patient that her cough may be secondary to use of lisinopril. Patient verbalizes understanding. Have suggested that the patient follow-up with her primary care doctor for  further evaluation of her symptoms and to discuss discontinuing her blood pressure medication to see if this relieves her cough. Supportive treatment advised and return precautions given. Patient agreeable to plan with no unaddressed concerns. Patient discharged in good condition.  I personally performed the services described in this documentation, which was  scribed in my presence. The recorded information has been reviewed and is accurate.    Filed Vitals:   03/28/15 2039  BP: 139/75  Pulse: 91  Temp: 97.9 F (36.6 C)  TempSrc: Oral  Resp: 18  SpO2: 100%      Antonietta Breach, PA-C 03/28/15 [place], DO 03/29/15 [id number]

## 2015-03-28 NOTE — ED Notes (Signed)
Pt c/o sore throat, cough and body aches, recently seen for theses complaints, states she has has not improved.

## 2015-03-28 NOTE — Discharge Instructions (Signed)
Your cough may be due to the Lisinopril you are taking. Discussed this with your primary doctor as you may wish to discontinue this medication. Do not stop your blood pressure medication without talking with your doctor. Take Hycodan and Tessalon as prescribed, as needed for cough. Take Tylenol for pain.  Cough, Adult Coughing is a reflex that clears your throat and your airways. Coughing helps to heal and protect your lungs. It is normal to cough occasionally, but a cough that happens with other symptoms or lasts a long time may be a sign of a condition that needs treatment. A cough may last only 2-3 weeks (acute), or it may last longer than 8 weeks (chronic). CAUSES Coughing is commonly caused by:  Breathing in substances that irritate your lungs.  A viral or bacterial respiratory infection.  Allergies.  Asthma.  Postnasal drip.  Smoking.  Acid backing up from the stomach into the esophagus (gastroesophageal reflux).  Certain medicines.  Chronic lung problems, including COPD (or rarely, lung cancer).  Other medical conditions such as heart failure. HOME CARE INSTRUCTIONS  Pay attention to any changes in your symptoms. Take these actions to help with your discomfort:  Take medicines only as told by your health care provider.  If you were prescribed an antibiotic medicine, take it as told by your health care provider. Do not stop taking the antibiotic even if you start to feel better.  Talk with your health care provider before you take a cough suppressant medicine.  Drink enough fluid to keep your urine clear or pale yellow.  If the air is dry, use a cold steam vaporizer or humidifier in your bedroom or your home to help loosen secretions.  Avoid anything that causes you to cough at work or at home.  If your cough is worse at night, try sleeping in a semi-upright position.  Avoid cigarette smoke. If you smoke, quit smoking. If you need help quitting, ask your health care  provider.  Avoid caffeine.  Avoid alcohol.  Rest as needed. SEEK MEDICAL CARE IF:   You have new symptoms.  You cough up pus.  Your cough does not get better after 2-3 weeks, or your cough gets worse.  You cannot control your cough with suppressant medicines and you are losing sleep.  You develop pain that is getting worse or pain that is not controlled with pain medicines.  You have a fever.  You have unexplained weight loss.  You have night sweats. SEEK IMMEDIATE MEDICAL CARE IF:  You cough up blood.  You have difficulty breathing.  Your heartbeat is very fast.   This information is not intended to replace advice given to you by your health care provider. Make sure you discuss any questions you have with your health care provider.   Document Released: 10/29/2010 Document Revised: 01/21/2015 Document Reviewed: 07/09/2014 Elsevier Interactive Patient Education [employer].

## 2015-04-01 ENCOUNTER — Other Ambulatory Visit: Payer: Self-pay | Admitting: *Deleted

## 2015-04-01 LAB — CULTURE, GROUP A STREP

## 2015-04-01 MED ORDER — GABAPENTIN 100 MG PO CAPS: ORAL_CAPSULE | ORAL | Status: DC

## 2015-04-01 NOTE — Telephone Encounter (Signed)
Attempted to call and left Mychart messages. No response.

## 2015-04-20 ENCOUNTER — Ambulatory Visit: Payer: Self-pay | Admitting: Family Medicine

## 2015-04-20 DIAGNOSIS — Z0289 Encounter for other administrative examinations: Secondary | ICD-10-CM

## 2015-05-08 ENCOUNTER — Telehealth: Payer: Self-pay

## 2015-05-08 NOTE — Telephone Encounter (Signed)
Pt is on the optum list for 2016. Can we try to get the pt in for an AWV/CPE for 2017.   Let me know if you can.  Thanks for all of your help!

## 2015-05-08 NOTE — Telephone Encounter (Signed)
Message routed to [employer], [place] for follow up.

## 2015-05-09 ENCOUNTER — Encounter (HOSPITAL_COMMUNITY): Payer: Self-pay

## 2015-05-09 ENCOUNTER — Emergency Department (HOSPITAL_COMMUNITY)
Admission: EM | Admit: 2015-05-09 | Discharge: 2015-05-09 | Disposition: A | Discharge status: Home / Self Care | Payer: 59 | Attending: Emergency Medicine | Admitting: Emergency Medicine

## 2015-05-09 DIAGNOSIS — Z79899 Other long term (current) drug therapy: Secondary | ICD-10-CM | POA: Insufficient documentation

## 2015-05-09 DIAGNOSIS — Z794 Long term (current) use of insulin: Secondary | ICD-10-CM | POA: Diagnosis not present

## 2015-05-09 DIAGNOSIS — Z8619 Personal history of other infectious and parasitic diseases: Secondary | ICD-10-CM | POA: Insufficient documentation

## 2015-05-09 DIAGNOSIS — I2510 Atherosclerotic heart disease of native coronary artery without angina pectoris: Secondary | ICD-10-CM | POA: Insufficient documentation

## 2015-05-09 DIAGNOSIS — F329 Major depressive disorder, single episode, unspecified: Secondary | ICD-10-CM | POA: Insufficient documentation

## 2015-05-09 DIAGNOSIS — Z880 Allergy status to penicillin: Secondary | ICD-10-CM | POA: Diagnosis not present

## 2015-05-09 DIAGNOSIS — Z7902 Long term (current) use of antithrombotics/antiplatelets: Secondary | ICD-10-CM | POA: Insufficient documentation

## 2015-05-09 DIAGNOSIS — J069 Acute upper respiratory infection, unspecified: Secondary | ICD-10-CM | POA: Diagnosis not present

## 2015-05-09 DIAGNOSIS — Z7982 Long term (current) use of aspirin: Secondary | ICD-10-CM | POA: Insufficient documentation

## 2015-05-09 DIAGNOSIS — I252 Old myocardial infarction: Secondary | ICD-10-CM | POA: Diagnosis not present

## 2015-05-09 DIAGNOSIS — K219 Gastro-esophageal reflux disease without esophagitis: Secondary | ICD-10-CM | POA: Diagnosis not present

## 2015-05-09 DIAGNOSIS — E1140 Type 2 diabetes mellitus with diabetic neuropathy, unspecified: Secondary | ICD-10-CM | POA: Insufficient documentation

## 2015-05-09 DIAGNOSIS — E785 Hyperlipidemia, unspecified: Secondary | ICD-10-CM | POA: Diagnosis not present

## 2015-05-09 DIAGNOSIS — I10 Essential (primary) hypertension: Secondary | ICD-10-CM | POA: Diagnosis not present

## 2015-05-09 DIAGNOSIS — Z9889 Other specified postprocedural states: Secondary | ICD-10-CM | POA: Diagnosis not present

## 2015-05-09 DIAGNOSIS — B9789 Other viral agents as the cause of diseases classified elsewhere: Secondary | ICD-10-CM

## 2015-05-09 DIAGNOSIS — R05 Cough: Secondary | ICD-10-CM | POA: Diagnosis present

## 2015-05-09 NOTE — ED Notes (Signed)
Pt reports generalized body aches, chills, congestion, sore throat, and a cough X3 days. Pt reports low grade fever last night. No fever today. No distress noted.

## 2015-05-09 NOTE — Discharge Instructions (Signed)

## 2015-05-09 NOTE — ED Provider Notes (Signed)
CSN: [id number]     Arrival date & time 05/09/15  [id number] History   First MD Initiated Contact with Patient 05/09/15 [phone]     Chief Complaint  Patient presents with  . Generalized Body Aches  . Cough     (Consider location/radiation/quality/duration/timing/severity/associated sxs/prior Treatment) Patient is a 59 y.o. female presenting with cough. The history is provided by the patient.  Cough Cough characteristics:  Non-productive Severity:  Moderate Onset quality:  Gradual Duration:  3 days Timing:  Constant Progression:  Unchanged Chronicity:  New Smoker: no   Context: sick contacts (child with URI) and upper respiratory infection   Relieved by: antipyretics. Worsened by:  Nothing tried Associated symptoms: chills   Associated symptoms: no chest pain, no fever and no shortness of breath     Past Medical History  Diagnosis Date  . History of chicken pox   . Depression   . Type 2 diabetes, uncontrolled, with neuropathy ([place])     dx 1993  . Chronic bronchitis   . Syncopal episodes   . GERD (gastroesophageal reflux disease)   . Seasonal allergies   . High blood pressure   . HLD (hyperlipidemia)   . History of ovarian cyst   . Tubal pregnancy   . Tachyarrhythmia     ER visits with adenosine push to control  . CAD (coronary artery disease)     STEMI 07/2013 - treated with PTCA of the RCA followed by DES to the intermediate ramus  . Seizure ([place]) 1977    off AEDs   Past Surgical History  Procedure Laterality Date  . Tonsillectomy and adenoidectomy  1965  . Ovarian cyst surgery  1998  . Dilation and curettage of uterus  1984  . Ectopic pregnancy surgery Right 1984    tube removal 2/2 hemorrhage  . Rotator cuff repair  2006    Right  . Cardiac catheterization  07/2013    angioplasty to RCA and DES to ramus intermedius  . Left heart catheterization with coronary angiogram N/A 07/23/2013    Procedure: LEFT HEART CATHETERIZATION WITH CORONARY ANGIOGRAM;  Surgeon: Sinclair Grooms, MD;  Location: [facility] C[facility] LAB;  Service: Cardiovascular;  Laterality: N/A;  . Percutaneous coronary stent intervention (pci-s) N/A 07/24/2013    Procedure: PERCUTANEOUS CORONARY STENT INTERVENTION (PCI-S);  Surgeon: Sinclair Grooms, MD;  Location: [facility] CATH LAB;  Service: Cardiovascular;  Laterality: N/A;  . Submandibular duct ligation Right 10/2014    R submandibular gland stone removed [place] Obey)   Family History  Problem Relation Age of Onset  . Diabetes Mother   . Hyperlipidemia Mother   . Hypertension Mother   . Stroke Mother   . Coronary artery disease Mother     CABG  . Heart disease Mother   . Diabetes Father   . Hypertension Father   . Hyperlipidemia Father   . Coronary artery disease Father     CABG  . Heart disease Father     CHF  . Pancreatic cancer Father   . Heart disease Brother     arrhythmia  . Diabetes Maternal Grandmother   . Diabetes Maternal Grandfather   . Diabetes Paternal Grandmother   . Diabetes Paternal Grandfather   . Kidney disease Neg Hx    Social History  Substance Use Topics  . Smoking status: Never Smoker   . Smokeless tobacco: Never Used  . Alcohol Use: No   OB History    No data available     Review  of Systems  Constitutional: Positive for chills. Negative for fever.  Respiratory: Positive for cough. Negative for shortness of breath.   Cardiovascular: Negative for chest pain.  All other systems reviewed and are negative.     Allergies  Clindamycin/lincomycin; Erythromycin; Lipitor; and Penicillins  Home Medications   Prior to Admission medications   Medication Sig Start Date End Date Taking? Authorizing Provider  acetaminophen (TYLENOL) 500 MG tablet Take 1 tablet (500 mg total) by mouth every 6 (six) hours as needed. 03/28/15  Yes Antonietta Breach, PA-C  aspirin EC 81 MG EC tablet Take 1 tablet (81 mg total) by mouth daily. 07/25/13  Yes Sueanne Margarita, MD  benzonatate (TESSALON) 100 MG capsule Take 1 capsule (100 mg total)  by mouth 3 (three) times daily as needed for cough. 03/28/15  Yes Antonietta Breach, PA-C  citalopram (CELEXA) 20 MG tablet Take 1 tablet (20 mg total) by mouth daily. 03/06/15  Yes [name], MD  clopidogrel (PLAVIX) 75 MG tablet Take 1 tablet (75 mg total) by mouth daily. 01/29/15  Yes [name], MD  furosemide (LASIX) 40 MG tablet Take 0.5 tablets (20 mg total) by mouth daily as needed for edema. 06/24/14  Yes [name], MD  gabapentin (NEURONTIN) 100 MG capsule Take one capsule at bedtime **MUST HAVE PHYSICAL FOR FURTHER REFILLS** 04/01/15  Yes [name], MD  HYDROcodone-homatropine [facility]) 5-1.5 MG/5ML syrup Take 5 mLs by mouth every 6 (six) hours as needed for cough. 03/28/15  Yes Antonietta Breach, PA-C  insulin detemir (LEVEMIR) 100 UNIT/ML injection Inject 0.6 mLs (60 Units total) into the skin 2 (two) times daily. 02/10/15  Yes [name], MD  insulin NPH-regular Human (NOVOLIN 70/30) (70-30) 100 UNIT/ML injection Inject 20 Units into the skin 3 (three) times daily with meals.  05/14/14  Yes [name], MD  lisinopril (ZESTRIL) 2.5 MG tablet Take 1 tablet (2.5 mg total) by mouth daily. 10/07/14  Yes [name], MD  loperamide (IMODIUM) 2 MG capsule Take 1 capsule (2 mg total) by mouth 4 (four) times daily as needed for diarrhea or loose stools. 11/26/14  Yes Nicole Pisciotta, PA-C  metFORMIN (GLUCOPHAGE) 1000 MG tablet Take 1 tablet (1,000 mg total) by mouth 2 (two) times daily with a meal. 08/14/14  Yes [name], MD  methocarbamol (ROBAXIN) 500 MG tablet Take 1 tablet (500 mg total) by mouth 3 (three) times daily as needed for muscle spasms. 12/03/14  Yes Lucille Passy, MD  metoprolol tartrate (LOPRESSOR) 25 MG tablet Take 0.5 tablets (12.5 mg total) by mouth 2 (two) times daily. 01/23/15  Yes [name], MD  nitroGLYCERIN (NITROSTAT) 0.4 MG SL tablet Place 1 tablet (0.4 mg total) under the tongue every 5 (five) minutes as needed for chest pain. 07/25/13   Yes Brittainy [place] Noe, PA-C  pantoprazole (PROTONIX) 40 MG tablet Take 1 tablet (40 mg total) by mouth daily. 03/16/15  Yes [name], MD  potassium chloride (K-DUR,KLOR-CON) 10 MEQ tablet Take 1 tablet (10 mEq total) by mouth daily as needed. With lasix 06/24/14  Yes [name], MD  promethazine (PHENERGAN) 25 MG tablet Take 1 tablet (25 mg total) by mouth every 8 (eight) hours as needed for nausea or vomiting. 02/01/15  Yes Christopher Lawyer, PA-C  simvastatin (ZOCOR) 40 MG tablet TAKE 1 TABLET (40 MG TOTAL) BY MOUTH DAILY. 12/31/14  Yes [name], MD  traMADol (ULTRAM) 50 MG tablet Take 1-2 tablets (50-100 mg total) by mouth every 12 (twelve) hours as needed for  moderate pain. 02/09/15  Yes [name], MD   BP 134/65 mmHg  Pulse 71  Temp(Src) 97.9 F (36.6 C) (Oral)  Resp 16  Ht 5\' 1"  (1.549 m)  Wt 185 lb (83.915 kg)  BMI 34.97 kg/m2  SpO2 97% Physical Exam  Constitutional: She is oriented to person, place, and time. She appears well-developed and well-nourished. No distress.  HENT:  Head: Normocephalic.  Mouth/Throat: Oropharynx is clear and moist.  Eyes: Conjunctivae are normal.  Neck: Neck supple. No tracheal deviation present.  Cardiovascular: Normal rate, regular rhythm and normal heart sounds.   Pulmonary/Chest: Effort normal and breath sounds normal. No respiratory distress. She has no wheezes. She has no rales.  Abdominal: Soft. She exhibits no distension.  Neurological: She is alert and oriented to person, place, and time.  Skin: Skin is warm and dry.  Psychiatric: She has a normal mood and affect.  Vitals reviewed.   ED Course  Procedures (including critical care time) Labs Review Labs Reviewed - No data to display  Imaging Review No results found. I have personally reviewed and evaluated these images and lab results as part of my medical decision-making.   EKG Interpretation None      MDM   Final diagnoses:  Viral URI with cough     59 y.o. female presents with cough for 3 days and resolved fever with sore throat and nasal congestion. No signs of respiratory distress, non-toxic appearing, CTAB, no concern for pneumonia with this clinical picture. No emergent testing indicated at this time. Pt discharged with likely viral cough which will be self limited in its course.     Leo Grosser, MD 05/09/15 [phone]

## 2015-05-09 NOTE — ED Notes (Signed)
Pt ambulated to restroom. 

## 2015-05-11 ENCOUNTER — Emergency Department (HOSPITAL_COMMUNITY)
Admission: EM | Admit: 2015-05-11 | Discharge: 2015-05-11 | Disposition: A | Discharge status: Home / Self Care | Payer: 59 | Attending: Emergency Medicine | Admitting: Emergency Medicine

## 2015-05-11 ENCOUNTER — Encounter (HOSPITAL_COMMUNITY): Payer: Self-pay | Admitting: *Deleted

## 2015-05-11 DIAGNOSIS — Z794 Long term (current) use of insulin: Secondary | ICD-10-CM | POA: Insufficient documentation

## 2015-05-11 DIAGNOSIS — I2510 Atherosclerotic heart disease of native coronary artery without angina pectoris: Secondary | ICD-10-CM | POA: Insufficient documentation

## 2015-05-11 DIAGNOSIS — Z8619 Personal history of other infectious and parasitic diseases: Secondary | ICD-10-CM | POA: Insufficient documentation

## 2015-05-11 DIAGNOSIS — K219 Gastro-esophageal reflux disease without esophagitis: Secondary | ICD-10-CM | POA: Insufficient documentation

## 2015-05-11 DIAGNOSIS — E785 Hyperlipidemia, unspecified: Secondary | ICD-10-CM | POA: Insufficient documentation

## 2015-05-11 DIAGNOSIS — F329 Major depressive disorder, single episode, unspecified: Secondary | ICD-10-CM | POA: Insufficient documentation

## 2015-05-11 DIAGNOSIS — J40 Bronchitis, not specified as acute or chronic: Secondary | ICD-10-CM

## 2015-05-11 DIAGNOSIS — Z7982 Long term (current) use of aspirin: Secondary | ICD-10-CM | POA: Insufficient documentation

## 2015-05-11 DIAGNOSIS — Z880 Allergy status to penicillin: Secondary | ICD-10-CM | POA: Insufficient documentation

## 2015-05-11 DIAGNOSIS — J209 Acute bronchitis, unspecified: Secondary | ICD-10-CM | POA: Insufficient documentation

## 2015-05-11 DIAGNOSIS — E119 Type 2 diabetes mellitus without complications: Secondary | ICD-10-CM | POA: Insufficient documentation

## 2015-05-11 DIAGNOSIS — Z79899 Other long term (current) drug therapy: Secondary | ICD-10-CM | POA: Insufficient documentation

## 2015-05-11 MED ORDER — AZITHROMYCIN 250 MG PO TABS: 250.0000 mg | ORAL_TABLET | Freq: Every day | ORAL | Status: DC

## 2015-05-11 MED ORDER — ONDANSETRON HCL 4 MG PO TABS: 4.0000 mg | ORAL_TABLET | Freq: Three times a day (TID) | ORAL | Status: DC | PRN

## 2015-05-11 MED ORDER — ALBUTEROL SULFATE HFA 108 (90 BASE) MCG/ACT IN AERS
2.0000 | INHALATION_SPRAY | Freq: Once | RESPIRATORY_TRACT | Status: AC
  Administered 2015-05-11: 2 via RESPIRATORY_TRACT
  Filled 2015-05-11: qty 6.7

## 2015-05-11 MED ORDER — AEROCHAMBER PLUS FLO-VU MEDIUM MISC
1.0000 | Freq: Once | Status: AC
  Administered 2015-05-11: 1
  Filled 2015-05-11: qty 1

## 2015-05-11 MED ORDER — GUAIFENESIN 100 MG/5ML PO LIQD: 100.0000 mg | ORAL | Status: DC | PRN

## 2015-05-11 NOTE — ED Provider Notes (Signed)
CSN: [id number]     Arrival date & time 05/11/15  0847 History   First MD Initiated Contact with Patient 05/11/15 0859     No chief complaint on file.    (Consider location/radiation/quality/duration/timing/severity/associated sxs/prior Treatment) HPI   Pt with hx DM presents with URI symptoms x 6 days.  Has had chills, sweats, myalgias, dry cough, occasional nasal congestion, few episodes of diarrhea.  Blood sugars have been "up and down"  Denies any SOB or CP, sore throat.  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    Past Medical History  Diagnosis Date  . History of chicken pox   . Depression   . Type 2 diabetes, uncontrolled, with neuropathy ([place])     dx 1993  . Chronic bronchitis   . Syncopal episodes   . GERD (gastroesophageal reflux disease)   . Seasonal allergies   . High blood pressure   . HLD (hyperlipidemia)   . History of ovarian cyst   . Tubal pregnancy   . Tachyarrhythmia     ER visits with adenosine push to control  . CAD (coronary artery disease)     STEMI 07/2013 - treated with PTCA of the RCA followed by DES to the intermediate ramus  . Seizure ([place]) 1977    off AEDs   Past Surgical History  Procedure Laterality Date  . Tonsillectomy and adenoidectomy  1965  . Ovarian cyst surgery  1998  . Dilation and curettage of uterus  1984  . Ectopic pregnancy surgery Right 1984    tube removal 2/2 hemorrhage  . Rotator cuff repair  2006    Right  . Cardiac catheterization  07/2013    angioplasty to RCA and DES to ramus intermedius  . Left heart catheterization with coronary angiogram N/A 07/23/2013    Procedure: LEFT HEART CATHETERIZATION WITH CORONARY ANGIOGRAM;  Surgeon: Sinclair Grooms, MD;  Location: [facility] CATH LAB;  Service: Cardiovascular;  Laterality: N/A;  . Percutaneous coronary stent intervention (pci-s) N/A 07/24/2013    Procedure: PERCUTANEOUS CORONARY STENT INTERVENTION (PCI-S);  Surgeon: Sinclair Grooms, MD;  Location: [facility] CATH LAB;  Service: Cardiovascular;  Laterality: N/A;  . Submandibular duct ligation Right 10/2014    R submandibular gland stone removed Erik Obey)   Family History  Problem Relation Age of Onset  . Diabetes Mother   . Hyperlipidemia Mother   . Hypertension Mother   . Stroke Mother   . Coronary artery disease Mother     CABG  . Heart disease Mother   . Diabetes Father   . Hypertension Father   . Hyperlipidemia Father   . Coronary artery disease Father     CABG  . Heart disease Father     CHF  . Pancreatic cancer Father   . Heart disease Brother     arrhythmia  . Diabetes Maternal Grandmother   . Diabetes Maternal Grandfather   . Diabetes Paternal Grandmother   . Diabetes Paternal Grandfather   . Kidney disease Neg Hx    Social History  Substance Use Topics  . Smoking status: Never Smoker   . Smokeless tobacco: Never Used  . Alcohol Use: No   OB History    No data available     Review of Systems  All other systems reviewed and are negative.     Allergies  Clindamycin/lincomycin; Erythromycin; Lipitor; and Penicillins  Home Medications   Prior to Admission  medications   Medication Sig Start Date End Date Taking? Authorizing Provider  acetaminophen (TYLENOL) 500 MG tablet Take 1 tablet (500 mg total) by mouth every 6 (six) hours as needed. 03/28/15   Antonietta Breach, PA-C  aspirin EC 81 MG EC tablet Take 1 tablet (81 mg total) by mouth daily. 07/25/13   Sueanne Margarita, MD  benzonatate (TESSALON) 100 MG capsule Take 1 capsule (100 mg total) by mouth 3 (three) times daily as needed for cough. 03/28/15   Antonietta Breach, PA-C  citalopram (CELEXA) 20 MG tablet Take 1 tablet (20 mg total) by mouth daily. 03/06/15   [name], MD  clopidogrel (PLAVIX) 75 MG tablet Take 1 tablet (75 mg total) by mouth daily. 01/29/15   [name], MD  furosemide (LASIX) 40 MG tablet Take 0.5 tablets (20 mg total) by mouth daily as needed for edema. 06/24/14    [name], MD  gabapentin (NEURONTIN) 100 MG capsule Take one capsule at bedtime **MUST HAVE PHYSICAL FOR FURTHER REFILLS** 04/01/15   [name], MD  HYDROcodone-homatropine [facility]) 5-1.5 MG/5ML syrup Take 5 mLs by mouth every 6 (six) hours as needed for cough. 03/28/15   Antonietta Breach, PA-C  insulin detemir (LEVEMIR) 100 UNIT/ML injection Inject 0.6 mLs (60 Units total) into the skin 2 (two) times daily. 02/10/15   [name], MD  insulin NPH-regular Human (NOVOLIN 70/30) (70-30) 100 UNIT/ML injection Inject 20 Units into the skin 3 (three) times daily with meals.  05/14/14   [name], MD  lisinopril (ZESTRIL) 2.5 MG tablet Take 1 tablet (2.5 mg total) by mouth daily. 10/07/14   [name], MD  loperamide (IMODIUM) 2 MG capsule Take 1 capsule (2 mg total) by mouth 4 (four) times daily as needed for diarrhea or loose stools. 11/26/14   Nicole Pisciotta, PA-C  metFORMIN (GLUCOPHAGE) 1000 MG tablet Take 1 tablet (1,000 mg total) by mouth 2 (two) times daily with a meal. 08/14/14   [name], MD  methocarbamol (ROBAXIN) 500 MG tablet Take 1 tablet (500 mg total) by mouth 3 (three) times daily as needed for muscle spasms. 12/03/14   Lucille Passy, MD  metoprolol tartrate (LOPRESSOR) 25 MG tablet Take 0.5 tablets (12.5 mg total) by mouth 2 (two) times daily. 01/23/15   [name], MD  nitroGLYCERIN (NITROSTAT) 0.4 MG SL tablet Place 1 tablet (0.4 mg total) under the tongue every 5 (five) minutes as needed for chest pain. 07/25/13   Brittainy [place] Noe, PA-C  pantoprazole (PROTONIX) 40 MG tablet Take 1 tablet (40 mg total) by mouth daily. 03/16/15   [name], MD  potassium chloride (K-DUR,KLOR-CON) 10 MEQ tablet Take 1 tablet (10 mEq total) by mouth daily as needed. With lasix 06/24/14   [name], MD  promethazine (PHENERGAN) 25 MG tablet Take 1 tablet (25 mg total) by mouth every 8 (eight) hours as needed for nausea or vomiting. 02/01/15   Dalia [place]ading, PA-C  simvastatin (ZOCOR) 40 MG tablet TAKE 1 TABLET (40 MG TOTAL) BY MOUTH DAILY. 12/31/14   [name], MD  traMADol (ULTRAM) 50 MG tablet Take 1-2 tablets (50-100 mg total) by mouth every 12 (twelve) hours as needed for moderate pain. 02/09/15   [name], MD   BP 127/65 mmHg  Pulse 83  Temp(Src) 98.5 F (36.9 C) (Oral)  Resp 16  Ht 5\' 1"  (1.549 m)  Wt 76.386 kg  BMI 31.84 kg/m2  SpO2 95% Physical Exam  Constitutional: She appears well-developed and well-nourished. No distress.  HENT:  Head: Normocephalic and atraumatic.  Mouth/Throat: Oropharynx is clear and moist. No oropharyngeal exudate.  Eyes: Conjunctivae are normal.  Neck: Normal range of motion. Neck supple.  Cardiovascular: Normal rate and regular rhythm.   Pulmonary/Chest: Effort normal and breath sounds normal. No respiratory distress. She has no wheezes. She has no rales.  Neurological: She is alert. She exhibits normal muscle tone.  Skin: She is not diaphoretic.  Psychiatric: She has a normal mood and affect. Her behavior is normal.  Nursing note and vitals reviewed.   ED Course  Procedures (including critical care time) Labs Review Labs Reviewed - No data to display  Imaging Review No results found. I have personally reviewed and evaluated these images and lab results as part of my medical decision-making.   EKG Interpretation None      MDM   Final diagnoses:  Bronchitis    Afebrile, nontoxic patient with constellation of symptoms suggestive of viral syndrome x 6 days, worsening course in the setting of diabetes.  No concerning findings on exam.  Discharged home with supportive care, z-pak, PCP follow up.  Discussed result, findings, treatment, and follow up  with patient.  Pt given return precautions.  Pt verbalizes understanding and agrees with plan.       [place], PA-C 05/11/15 [id number]  Dorie Rank, MD 05/11/15 [phone]

## 2015-05-11 NOTE — Discharge Instructions (Signed)
Read the information below.  Use the prescribed medication as directed.  Please discuss all new medications with your pharmacist.  You may return to the Emergency Department at any time for worsening condition or any new symptoms that concern you.  If you develop high fevers that do not resolve with tylenol or ibuprofen, you have difficulty swallowing or breathing, or you are unable to tolerate fluids by mouth, return to the ER for a recheck.      Upper Respiratory Infection, Adult Most upper respiratory infections (URIs) are caused by a virus. A URI affects the nose, throat, and upper air passages. The most common type of URI is often called "the common cold." HOME CARE  1. Take medicines only as told by your doctor. 2. Gargle warm saltwater or take cough drops to comfort your throat as told by your doctor. 3. Use a warm mist humidifier or inhale steam from a shower to increase air moisture. This may make it easier to breathe. 4. Drink enough fluid to keep your pee (urine) clear or pale yellow. 5. Eat soups and other clear broths. 6. Have a healthy diet. 7. Rest as needed. 8. Go back to work when your fever is gone or your doctor says it is okay. 1. You may need to stay home longer to avoid giving your URI to others. 2. You can also wear a face mask and wash your hands often to prevent spread of the virus. 9. Use your inhaler more if you have asthma. 10. Do not use any tobacco products, including cigarettes, chewing tobacco, or electronic cigarettes. If you need help quitting, ask your doctor. GET HELP IF:  You are getting worse, not better.  Your symptoms are not helped by medicine.  You have chills.  You are getting more short of breath.  You have brown or red mucus.  You have yellow or brown discharge from your nose.  You have pain in your face, especially when you bend forward.  You have a fever.  You have puffy (swollen) neck glands.  You have pain while swallowing.  You  have white areas in the back of your throat. GET HELP RIGHT AWAY IF:   You have very bad or constant:  Headache.  Ear pain.  Pain in your forehead, behind your eyes, and over your cheekbones (sinus pain).  Chest pain.  You have long-lasting (chronic) lung disease and any of the following:  Wheezing.  Long-lasting cough.  Coughing up blood.  A change in your usual mucus.  You have a stiff neck.  You have changes in your:  Vision.  Hearing.  Thinking.  Mood. MAKE SURE YOU:   Understand these instructions.  Will watch your condition.  Will get help right away if you are not doing well or get worse.   This information is not intended to replace advice given to you by your health care provider. Make sure you discuss any questions you have with your health care provider.   Document Released: 10/19/2007 Document Revised: 09/16/2014 Document Reviewed: 08/07/2013 Elsevier Interactive Patient Education 2016 [employer].  How to Use an Inhaler Using your inhaler correctly is very important. Good technique will make sure that the medicine reaches your lungs.  HOW TO USE AN INHALER: 11. Take the cap off the inhaler. 12. If this is the first time using your inhaler, you need to prime it. Shake the inhaler for 5 seconds. Release four puffs into the air, away from your face. Ask your  doctor for help if you have questions. 13. Shake the inhaler for 5 seconds. 14. Turn the inhaler so the bottle is above the mouthpiece. 15. Put your pointer finger on top of the bottle. Your thumb holds the bottom of the inhaler. 16. Open your mouth. 17. Either hold the inhaler away from your mouth (the width of 2 fingers) or place your lips tightly around the mouthpiece. Ask your doctor which way to use your inhaler. 18. Breathe out as much air as possible. 19. Breathe in and push down on the bottle 1 time to release the medicine. You will feel the medicine go in your mouth and  throat. 20. Continue to take a deep breath in very slowly. Try to fill your lungs. 21. After you have breathed in completely, hold your breath for 10 seconds. This will help the medicine to settle in your lungs. If you cannot hold your breath for 10 seconds, hold it for as long as you can before you breathe out. 22. Breathe out slowly, through pursed lips. Whistling is an example of pursed lips. 23. If your doctor has told you to take more than 1 puff, wait at least 15-30 seconds between puffs. This will help you get the best results from your medicine. Do not use the inhaler more than your doctor tells you to. 24. Put the cap back on the inhaler. 25. Follow the directions from your doctor or from the inhaler package about cleaning the inhaler. If you use more than one inhaler, ask your doctor which inhalers to use and what order to use them in. Ask your doctor to help you figure out when you will need to refill your inhaler.  If you use a steroid inhaler, always rinse your mouth with water after your last puff, gargle and spit out the water. Do not swallow the water. GET HELP IF:  The inhaler medicine only partially helps to stop wheezing or shortness of breath.  You are having trouble using your inhaler.  You have some increase in thick spit (phlegm). GET HELP RIGHT AWAY IF:  The inhaler medicine does not help your wheezing or shortness of breath or you have tightness in your chest.  You have dizziness, headaches, or fast heart rate.  You have chills, fever, or night sweats.  You have a large increase of thick spit, or your thick spit is bloody. MAKE SURE YOU:   Understand these instructions.  Will watch your condition.  Will get help right away if you are not doing well or get worse.   This information is not intended to replace advice given to you by your health care provider. Make sure you discuss any questions you have with your health care provider.   Document Released:  02/09/2008 Document Revised: 02/20/2013 Document Reviewed: 11/29/2012 Elsevier Interactive Patient Education [employer].

## 2015-05-11 NOTE — ED Notes (Signed)
Declined W/C at D/C and was escorted to lobby by RN. 

## 2015-05-11 NOTE — ED Notes (Signed)
PT returns today for follow up for URI . Pt also needs a work note

## 2015-05-11 NOTE — ED Notes (Signed)
PT requesting work note

## 2015-05-20 ENCOUNTER — Encounter: Payer: Self-pay | Admitting: Family Medicine

## 2015-05-21 NOTE — Telephone Encounter (Signed)
Please see Mychart message.

## 2015-05-25 ENCOUNTER — Encounter: Payer: Self-pay | Admitting: Family Medicine

## 2015-05-25 NOTE — Telephone Encounter (Signed)
Agree needs eval wherever she can but in interim ok to try and get her into a med assist program thanks.

## 2015-05-26 NOTE — Telephone Encounter (Signed)
Rose- Is there anything you can help this patient with?

## 2015-05-28 NOTE — Telephone Encounter (Signed)
Pt no longer have insurance, and is trying to get into the patient assistant program.

## 2015-05-29 NOTE — Telephone Encounter (Signed)
Karen [name], I have sent Ms. Karen [name] some info which I hope is helpful for her.  Thank you.

## 2015-05-31 ENCOUNTER — Encounter: Payer: Self-pay | Admitting: Family Medicine

## 2015-06-01 MED ORDER — INSULIN GLARGINE 100 UNIT/ML SOLOSTAR PEN: 60.0000 [IU] | PEN_INJECTOR | Freq: Two times a day (BID) | SUBCUTANEOUS | Status: DC

## 2015-06-01 NOTE — Telephone Encounter (Signed)
Pt left v/m requesting cb at [phone] re to mychart message about getting lantus insulin; at this time pt does not have long acting insulin.

## 2015-06-01 NOTE — Telephone Encounter (Signed)
Sent in

## 2015-06-01 NOTE — Telephone Encounter (Signed)
Please see Mychart message from patient.  

## 2015-06-02 ENCOUNTER — Encounter: Payer: Self-pay | Admitting: Family Medicine

## 2015-06-03 NOTE — Telephone Encounter (Signed)
See Mychart message. Paperwork in your IN box for signature. Please return to me. i need to mail to patient for completion. Thanks!

## 2015-06-04 NOTE — Telephone Encounter (Signed)
Mailed to patient

## 2015-06-04 NOTE — Telephone Encounter (Signed)
In Kim's box. 

## 2015-06-10 ENCOUNTER — Other Ambulatory Visit: Payer: Self-pay | Admitting: *Deleted

## 2015-06-10 MED ORDER — CLOPIDOGREL BISULFATE 75 MG PO TABS: 75.0000 mg | ORAL_TABLET | Freq: Every day | ORAL | Status: DC

## 2015-06-23 ENCOUNTER — Encounter: Payer: Self-pay | Admitting: Family Medicine

## 2015-06-23 ENCOUNTER — Other Ambulatory Visit: Payer: Self-pay | Admitting: *Deleted

## 2015-06-23 MED ORDER — SIMVASTATIN 40 MG PO TABS: ORAL_TABLET | ORAL | Status: DC

## 2015-06-23 NOTE — Telephone Encounter (Signed)
Ok to refill 

## 2015-06-24 ENCOUNTER — Ambulatory Visit: Payer: Self-pay | Admitting: Family Medicine

## 2015-06-24 MED ORDER — TRAMADOL HCL 50 MG PO TABS: 50.0000 mg | ORAL_TABLET | Freq: Two times a day (BID) | ORAL | Status: DC | PRN

## 2015-06-24 NOTE — Telephone Encounter (Signed)
plz phone in. 

## 2015-06-24 NOTE — Telephone Encounter (Signed)
Rx called in as directed.   

## 2015-07-02 ENCOUNTER — Encounter: Payer: Self-pay | Admitting: Family Medicine

## 2015-07-02 ENCOUNTER — Other Ambulatory Visit: Payer: Self-pay | Admitting: Family Medicine

## 2015-07-02 ENCOUNTER — Ambulatory Visit (INDEPENDENT_AMBULATORY_CARE_PROVIDER_SITE_OTHER): Payer: BLUE CROSS/BLUE SHIELD | Admitting: Family Medicine

## 2015-07-02 VITALS — BP 116/78 | HR 72 | Temp 97.8°F | Wt 174.5 lb

## 2015-07-02 DIAGNOSIS — IMO0002 Reserved for concepts with insufficient information to code with codable children: Secondary | ICD-10-CM

## 2015-07-02 DIAGNOSIS — Z0000 Encounter for general adult medical examination without abnormal findings: Secondary | ICD-10-CM

## 2015-07-02 DIAGNOSIS — Z1159 Encounter for screening for other viral diseases: Secondary | ICD-10-CM | POA: Diagnosis not present

## 2015-07-02 DIAGNOSIS — K219 Gastro-esophageal reflux disease without esophagitis: Secondary | ICD-10-CM

## 2015-07-02 DIAGNOSIS — Z9861 Coronary angioplasty status: Secondary | ICD-10-CM

## 2015-07-02 DIAGNOSIS — Z1211 Encounter for screening for malignant neoplasm of colon: Secondary | ICD-10-CM | POA: Diagnosis not present

## 2015-07-02 DIAGNOSIS — E785 Hyperlipidemia, unspecified: Secondary | ICD-10-CM | POA: Diagnosis not present

## 2015-07-02 DIAGNOSIS — Z1239 Encounter for other screening for malignant neoplasm of breast: Secondary | ICD-10-CM

## 2015-07-02 DIAGNOSIS — E1165 Type 2 diabetes mellitus with hyperglycemia: Secondary | ICD-10-CM | POA: Diagnosis not present

## 2015-07-02 DIAGNOSIS — F32A Depression, unspecified: Secondary | ICD-10-CM

## 2015-07-02 DIAGNOSIS — I10 Essential (primary) hypertension: Secondary | ICD-10-CM

## 2015-07-02 DIAGNOSIS — I2510 Atherosclerotic heart disease of native coronary artery without angina pectoris: Secondary | ICD-10-CM

## 2015-07-02 DIAGNOSIS — F329 Major depressive disorder, single episode, unspecified: Secondary | ICD-10-CM

## 2015-07-02 DIAGNOSIS — Z23 Encounter for immunization: Secondary | ICD-10-CM | POA: Diagnosis not present

## 2015-07-02 DIAGNOSIS — E1140 Type 2 diabetes mellitus with diabetic neuropathy, unspecified: Secondary | ICD-10-CM

## 2015-07-02 DIAGNOSIS — E113399 Type 2 diabetes mellitus with moderate nonproliferative diabetic retinopathy without macular edema, unspecified eye: Secondary | ICD-10-CM

## 2015-07-02 LAB — LIPID PANEL
Cholesterol: 294 mg/dL — ABNORMAL HIGH (ref 0–200)
HDL: 40 mg/dL (ref >39.00)
Total CHOL/HDL Ratio: 7

## 2015-07-02 LAB — COMPREHENSIVE METABOLIC PANEL
ALBUMIN: 4.2 g/dL (ref 3.5–5.2)
ALK PHOS: 72 U/L (ref 39–117)
ALT: 21 U/L (ref 0–35)
AST: 24 U/L (ref 0–37)
BILIRUBIN TOTAL: 0.5 mg/dL (ref 0.2–1.2)
BUN: 18 mg/dL (ref 6–23)
CALCIUM: 9.9 mg/dL (ref 8.4–10.5)
CHLORIDE: 100 meq/L (ref 96–112)
CO2: 28 mEq/L (ref 19–32)
CREATININE: 0.65 mg/dL (ref 0.40–1.20)
GFR: 99.56 mL/min (ref >60.00)
Glucose, Bld: 205 mg/dL — ABNORMAL HIGH (ref 70–99)
Potassium: 4.2 mEq/L (ref 3.5–5.1)
SODIUM: 135 meq/L (ref 135–145)
TOTAL PROTEIN: 7.5 g/dL (ref 6.0–8.3)

## 2015-07-02 LAB — MICROALBUMIN / CREATININE URINE RATIO
CREATININE, U: 101.6 mg/dL
MICROALB UR: 3.3 mg/dL — AB (ref 0.0–1.9)
Microalb Creat Ratio: 3.2 mg/g (ref 0.0–30.0)

## 2015-07-02 LAB — HEMOGLOBIN A1C: HEMOGLOBIN A1C: 12.3 % — AB (ref 4.6–6.5)

## 2015-07-02 LAB — LDL CHOLESTEROL, DIRECT: Direct LDL: 114 mg/dL

## 2015-07-02 LAB — TSH: TSH: 0.84 u[IU]/mL (ref 0.35–4.50)

## 2015-07-02 MED ORDER — GABAPENTIN 100 MG PO CAPS: 100.0000 mg | ORAL_CAPSULE | Freq: Two times a day (BID) | ORAL | Status: DC

## 2015-07-02 MED ORDER — NITROGLYCERIN 0.4 MG SL SUBL: 0.4000 mg | SUBLINGUAL_TABLET | SUBLINGUAL | Status: DC | PRN

## 2015-07-02 NOTE — Progress Notes (Signed)
Pre visit review using our clinic review tool, if applicable. No additional management support is needed unless otherwise documented below in the visit note. 

## 2015-07-02 NOTE — Assessment & Plan Note (Signed)
Known retinopathy (last checked 12/2013). Due for f/u. Unable to afford appt at this time.

## 2015-07-02 NOTE — Addendum Note (Signed)
Addended by: Royann Shivers A on: 07/02/2015 01:52 PM   Modules accepted: Orders

## 2015-07-02 NOTE — Assessment & Plan Note (Signed)
Chronic, stable. Continue current regimen. 

## 2015-07-02 NOTE — Progress Notes (Signed)
BP 116/78 mmHg  Pulse 72  Temp(Src) 97.8 F (36.6 C) (Oral)  Wt 174 lb 8 oz (79.153 kg)   CC: CPE  Subjective:    Patient ID: Karen [name], female    DOB: [birth year], 60 y.o.   MRN: [id number]  HPI: Karen [name] is a 60 y.o. female presenting on 07/02/2015 for Annual Exam   Complicated patient with multiple lapses in medical care presents for physical today.  CAD s/p STEMI and PCI with stent 07/2013 - compliant with plavix/aspirin daily.  Body aches - takes gabapentin 100-257m nightly. Hangover effect next morning.  DM with retinopathy, neuropathy, angiopathy s/p STEMI - complaint with lantus 60u BID, metformin 10075mbid, reports compliance with this. Checks sugars very infrequently.  Due for eye exam but unable to afford at this time. Has not completed DSME. Lab Results  Component Value Date   HGBA1C 11.2* 02/21/2014    Diabetic Foot Exam - Simple   Simple Foot Form  Diabetic Foot exam was performed with the following findings:  Yes 07/02/2015  1:32 PM  Visual Inspection  No deformities, no ulcerations, no other skin breakdown bilaterally:  Yes  Sensation Testing  See comments:  Yes  Pulse Check  See comments:  Yes  Comments  1+ DP bilaterally (diminished) Diminished sensation to light touch and monofilament       Preventative: Colon cancer screening - denies blood in stool or BM changes. No fmhx. Requests stool kit. Never returned last iFOB.  Well woman exam - last pap 05/2013 WNL. H/o abnormal paps. Declines repeat today. Mammogram - no recent mammogram. Declines  Flu shot - today Pneumovax - 2015 Tetanus - unsure Seat belt use discussed Sunscreen use discussed. No changing moles on skin.   Caffeine: 6 diet sodas/day Lives with 2 dogs. 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 Interim medical  history since our last visit reviewed. Allergies and medications reviewed and updated. Current Outpatient Prescriptions on File Prior to Visit  Medication Sig  . acetaminophen (TYLENOL) 500 MG tablet Take 1 tablet (500 mg total) by mouth every 6 (six) hours as needed.  . Marland Kitchenspirin EC 81 MG EC tablet Take 1 tablet (81 mg total) by mouth daily.  . citalopram (CELEXA) 20 MG tablet Take 1 tablet (20 mg total) by mouth daily.  . clopidogrel (PLAVIX) 75 MG tablet Take 1 tablet (75 mg total) by mouth daily.  . Marland KitchenuaiFENesin (ROBITUSSIN) 100 MG/5ML liquid Take 5-10 mLs (100-200 mg total) by mouth every 4 (four) hours as needed for cough.  . Insulin Glargine (LANTUS SOLOSTAR) 100 UNIT/ML Solostar Pen Inject 60 Units into the skin 2 (two) times daily. QS 1 mo  . lisinopril (ZESTRIL) 2.5 MG tablet Take 1 tablet (2.5 mg total) by mouth daily.  . metFORMIN (GLUCOPHAGE) 1000 MG tablet Take 1 tablet (1,000 mg total) by mouth 2 (two) times daily with a meal.  . metoprolol tartrate (LOPRESSOR) 25 MG tablet Take 0.5 tablets (12.5 mg total) by mouth 2 (two) times daily.  . pantoprazole (PROTONIX) 40 MG tablet Take 1 tablet (40 mg total) by mouth daily.  . simvastatin (ZOCOR) 40 MG tablet TAKE 1 TABLET (40 MG TOTAL) BY MOUTH DAILY.  . traMADol (ULTRAM) 50 MG tablet Take 1-2 tablets (50-100 mg total) by mouth every 12 (twelve) hours as needed for moderate pain.  . Marland Kitchenoperamide (  IMODIUM) 2 MG capsule Take 1 capsule (2 mg total) by mouth 4 (four) times daily as needed for diarrhea or loose stools. (Patient not taking: Reported on 07/02/2015)  . methocarbamol (ROBAXIN) 500 MG tablet Take 1 tablet (500 mg total) by mouth 3 (three) times daily as needed for muscle spasms. (Patient not taking: Reported on 07/02/2015)  . promethazine (PHENERGAN) 25 MG tablet Take 1 tablet (25 mg total) by mouth every 8 (eight) hours as needed for nausea or vomiting. (Patient not taking: Reported on 07/02/2015)   No current facility-administered  medications on file prior to visit.    Review of Systems  Constitutional: Negative for fever, chills, activity change, appetite change, fatigue and unexpected weight change.  HENT: Negative for hearing loss.   Eyes: Negative for visual disturbance.  Respiratory: Negative for cough, chest tightness, shortness of breath and wheezing.   Cardiovascular: Negative for chest pain, palpitations and leg swelling.  Gastrointestinal: Positive for constipation (mild). Negative for nausea, vomiting, abdominal pain, diarrhea, blood in stool and abdominal distention.  Genitourinary: Negative for hematuria and difficulty urinating.  Musculoskeletal: Negative for myalgias, arthralgias and neck pain.  Skin: Negative for rash.  Neurological: Negative for dizziness, seizures, syncope and headaches.  Hematological: Negative for adenopathy. Does not bruise/bleed easily.  Psychiatric/Behavioral: Negative for dysphoric mood. The patient is not nervous/anxious.    Per HPI unless specifically indicated in ROS section     Objective:    BP 116/78 mmHg  Pulse 72  Temp(Src) 97.8 F (36.6 C) (Oral)  Wt 174 lb 8 oz (79.153 kg)  Wt Readings from Last 3 Encounters:  07/02/15 174 lb 8 oz (79.153 kg)  05/11/15 168 lb 6.4 oz (76.386 kg)  05/09/15 185 lb (83.915 kg)    Physical Exam  Constitutional: She is oriented to person, place, and time. She appears well-developed and well-nourished. No distress.  HENT:  Head: Normocephalic and atraumatic.  Right Ear: Hearing, tympanic membrane, external ear and ear canal normal.  Left Ear: Hearing, tympanic membrane, external ear and ear canal normal.  Nose: Nose normal.  Mouth/Throat: Uvula is midline, oropharynx is clear and moist and mucous membranes are normal. No oropharyngeal exudate, posterior oropharyngeal edema or posterior oropharyngeal erythema.  Eyes: Conjunctivae and EOM are normal. Pupils are equal, round, and reactive to light. No scleral icterus.  Neck:  Normal range of motion. Neck supple. Carotid bruit is not present. No thyromegaly present.  Cardiovascular: Normal rate, regular rhythm, normal heart sounds and intact distal pulses.   No murmur heard. Pulses:      Radial pulses are 2+ on the right side, and 2+ on the left side.  Pulmonary/Chest: Effort normal and breath sounds normal. No respiratory distress. She has no wheezes. She has no rales.  Abdominal: Soft. Bowel sounds are normal. She exhibits no distension and no mass. There is no tenderness. There is no rebound and no guarding.  Musculoskeletal: Normal range of motion. She exhibits no edema.  Lymphadenopathy:    She has no cervical adenopathy.  Neurological: She is alert and oriented to person, place, and time.  CN grossly intact, station and gait intact  Skin: Skin is warm and dry. No rash noted.  Psychiatric: She has a normal mood and affect. Her behavior is normal. Judgment and thought content normal.  Nursing note and vitals reviewed.  Results for orders placed or performed during the hospital encounter of 03/28/15  Rapid strep screen (not at [facility])  Result Value Ref Range   Streptococcus, Group  A Screen (Direct) NEGATIVE NEGATIVE  Culture, Group A Strep  Result Value Ref Range   Strep A Culture Comment (A)       Assessment & Plan:   Problem List Items Addressed This Visit    Type 2 diabetes, uncontrolled, with neuropathy ([place])    Chronically uncontrolled, multiple lapses in medical care, significant financial concerns. Will check A1c. Foot exam today. Unable to afford eye exam at this time. Continue current meds. RTC 3 mo DM f/u, encouraged she start checking sugars more regularly. Recommended DSME referral.       Relevant Orders   Comprehensive metabolic panel   Hemoglobin A1c   Microalbumin / creatinine urine ratio   Ambulatory referral to diabetic education   HTN (hypertension)    Chronic, stable. Continue current regimen.      Relevant Medications    nitroGLYCERIN (NITROSTAT) 0.4 MG SL tablet   Other Relevant Orders   Comprehensive metabolic panel   Health maintenance examination - Primary    Preventative protocols reviewed and updated unless pt declined. Discussed healthy diet and lifestyle.  Flu shot today Declines breast/pelvic/pap - states will return for this in 3 mo Stool kit today - encouraged she return this year.      GERD (gastroesophageal reflux disease)    Discussed trial pantoprazole 37m QOD.      Dyslipidemia    Continue simvastatin 425mdaily.       Relevant Medications   nitroGLYCERIN (NITROSTAT) 0.4 MG SL tablet   Other Relevant Orders   Lipid panel   Comprehensive metabolic panel   TSH   Diabetic retinopathy ([place]   Known retinopathy (last checked 12/2013). Due for f/u. Unable to afford appt at this time.      Depression    Continue celexa 2066maily.      CAD S/P percutaneous coronary angioplasty    Denies anginal sxs at this time      Relevant Medications   nitroGLYCERIN (NITROSTAT) 0.4 MG SL tablet    Other Visit Diagnoses    Special screening for malignant neoplasms, colon        Relevant Orders    Fecal occult blood, imunochemical    Breast cancer screening        Relevant Orders    MM Digital Screening    Need for hepatitis C screening test        Relevant Orders    Hepatitis C antibody, reflex        Follow up plan: Return in about 3 months (around 09/29/2015), or as need, for follow up visit.

## 2015-07-02 NOTE — Assessment & Plan Note (Signed)
Chronically uncontrolled, multiple lapses in medical care, significant financial concerns. Will check A1c. Foot exam today. Unable to afford eye exam at this time. Continue current meds. RTC 3 mo DM f/u, encouraged she start checking sugars more regularly. Recommended DSME referral.

## 2015-07-02 NOTE — Assessment & Plan Note (Signed)
Continue simvastatin 40 mg daily. 

## 2015-07-02 NOTE — Assessment & Plan Note (Signed)
Continue celexa 20mg daily.  

## 2015-07-02 NOTE — Assessment & Plan Note (Signed)
Discussed trial pantoprazole 40mg  QOD.

## 2015-07-02 NOTE — Patient Instructions (Addendum)
Flu shot today labwork today. Pass by lab for stool kit. Try pantoprazole '40mg'$  every other day to control heartburn symptoms. We will call you to set up mammogram. Return for diabetes follow up and pap smear in 3 months.   Health Maintenance, Female Adopting a healthy lifestyle and getting preventive care can go a long way to promote health and wellness. Talk with your health care provider about what schedule of regular examinations is right for you. This is a good chance for you to check in with your provider about disease prevention and staying healthy. In between checkups, there are plenty of things you can do on your own. Experts have done a lot of research about which lifestyle changes and preventive measures are most likely to keep you healthy. Ask your health care provider for more information. WEIGHT AND DIET  Eat a healthy diet  Be sure to include plenty of vegetables, fruits, low-fat dairy products, and lean protein.  Do not eat a lot of foods high in solid fats, added sugars, or salt.  Get regular exercise. This is one of the most important things you can do for your health.  Most adults should exercise for at least 150 minutes each week. The exercise should increase your heart rate and make you sweat (moderate-intensity exercise).  Most adults should also do strengthening exercises at least twice a week. This is in addition to the moderate-intensity exercise.  Maintain a healthy weight  Body mass index (BMI) is a measurement that can be used to identify possible weight problems. It estimates body fat based on height and weight. Your health care provider can help determine your BMI and help you achieve or maintain a healthy weight.  For females 72 years of age and older:   A BMI below 18.5 is considered underweight.  A BMI of 18.5 to 24.9 is normal.  A BMI of 25 to 29.9 is considered overweight.  A BMI of 30 and above is considered obese.  Watch levels of cholesterol  and blood lipids  You should start having your blood tested for lipids and cholesterol at 58 years of age, then have this test every 5 years.  You may need to have your cholesterol levels checked more often if:  Your lipid or cholesterol levels are high.  You are older than 58 years of age.  You are at high risk for heart disease.  CANCER SCREENING   Lung Cancer  Lung cancer screening is recommended for adults 69-32 years old who are at high risk for lung cancer because of a history of smoking.  A yearly low-dose CT scan of the lungs is recommended for people who:  Currently smoke.  Have quit within the past 15 years.  Have at least a 30-pack-year history of smoking. A pack year is smoking an average of one pack of cigarettes a day for 1 year.  Yearly screening should continue until it has been 15 years since you quit.  Yearly screening should stop if you develop a health problem that would prevent you from having lung cancer treatment.  Breast Cancer  Practice breast self-awareness. This means understanding how your breasts normally appear and feel.  It also means doing regular breast self-exams. Let your health care provider know about any changes, no matter how small.  If you are in your 20s or 30s, you should have a clinical breast exam (CBE) by a health care provider every 1-3 years as part of a regular health exam.  If you are 40 or older, have a CBE every year. Also consider having a breast X-ray (mammogram) every year.  If you have a family history of breast cancer, talk to your health care provider about genetic screening.  If you are at high risk for breast cancer, talk to your health care provider about having an MRI and a mammogram every year.  Breast cancer gene (BRCA) assessment is recommended for women who have family members with BRCA-related cancers. BRCA-related cancers include:  Breast.  Ovarian.  Tubal.  Peritoneal cancers.  Results of the  assessment will determine the need for genetic counseling and BRCA1 and BRCA2 testing. Cervical Cancer Your health care provider may recommend that you be screened regularly for cancer of the pelvic organs (ovaries, uterus, and vagina). This screening involves a pelvic examination, including checking for microscopic changes to the surface of your cervix (Pap test). You may be encouraged to have this screening done every 3 years, beginning at age 65.  For women ages 37-65, health care providers may recommend pelvic exams and Pap testing every 3 years, or they may recommend the Pap and pelvic exam, combined with testing for human papilloma virus (HPV), every 5 years. Some types of HPV increase your risk of cervical cancer. Testing for HPV may also be done on women of any age with unclear Pap test results.  Other health care providers may not recommend any screening for nonpregnant women who are considered low risk for pelvic cancer and who do not have symptoms. Ask your health care provider if a screening pelvic exam is right for you.  If you have had past treatment for cervical cancer or a condition that could lead to cancer, you need Pap tests and screening for cancer for at least 20 years after your treatment. If Pap tests have been discontinued, your risk factors (such as having a new sexual partner) need to be reassessed to determine if screening should resume. Some women have medical problems that increase the chance of getting cervical cancer. In these cases, your health care provider may recommend more frequent screening and Pap tests. Colorectal Cancer  This type of cancer can be detected and often prevented.  Routine colorectal cancer screening usually begins at 58 years of age and continues through 58 years of age.  Your health care provider may recommend screening at an earlier age if you have risk factors for colon cancer.  Your health care provider may also recommend using home test kits  to check for hidden blood in the stool.  A small camera at the end of a tube can be used to examine your colon directly (sigmoidoscopy or colonoscopy). This is done to check for the earliest forms of colorectal cancer.  Routine screening usually begins at age 43.  Direct examination of the colon should be repeated every 5-10 years through 58 years of age. However, you may need to be screened more often if early forms of precancerous polyps or small growths are found. Skin Cancer  Check your skin from head to toe regularly.  Tell your health care provider about any new moles or changes in moles, especially if there is a change in a mole's shape or color.  Also tell your health care provider if you have a mole that is larger than the size of a pencil eraser.  Always use sunscreen. Apply sunscreen liberally and repeatedly throughout the day.  Protect yourself by wearing long sleeves, pants, a wide-brimmed hat, and sunglasses whenever you  are outside. HEART DISEASE, DIABETES, AND HIGH BLOOD PRESSURE   High blood pressure causes heart disease and increases the risk of stroke. High blood pressure is more likely to develop in:  People who have blood pressure in the high end of the normal range (130-139/85-89 mm Hg).  People who are overweight or obese.  People who are African American.  If you are 13-61 years of age, have your blood pressure checked every 3-5 years. If you are 85 years of age or older, have your blood pressure checked every year. You should have your blood pressure measured twice--once when you are at a hospital or clinic, and once when you are not at a hospital or clinic. Record the average of the two measurements. To check your blood pressure when you are not at a hospital or clinic, you can use:  An automated blood pressure machine at a pharmacy.  A home blood pressure monitor.  If you are between 46 years and 29 years old, ask your health care provider if you should  take aspirin to prevent strokes.  Have regular diabetes screenings. This involves taking a blood sample to check your fasting blood sugar level.  If you are at a normal weight and have a low risk for diabetes, have this test once every three years after 58 years of age.  If you are overweight and have a high risk for diabetes, consider being tested at a younger age or more often. PREVENTING INFECTION  Hepatitis B  If you have a higher risk for hepatitis B, you should be screened for this virus. You are considered at high risk for hepatitis B if:  You were born in a country where hepatitis B is common. Ask your health care provider which countries are considered high risk.  Your parents were born in a high-risk country, and you have not been immunized against hepatitis B (hepatitis B vaccine).  You have HIV or AIDS.  You use needles to inject street drugs.  You live with someone who has hepatitis B.  You have had sex with someone who has hepatitis B.  You get hemodialysis treatment.  You take certain medicines for conditions, including cancer, organ transplantation, and autoimmune conditions. Hepatitis C  Blood testing is recommended for:  Everyone born from [birth year] through 1963.  Anyone with known risk factors for hepatitis C. Sexually transmitted infections (STIs)  You should be screened for sexually transmitted infections (STIs) including gonorrhea and chlamydia if:  You are sexually active and are younger than 58 years of age.  You are older than 58 years of age and your health care provider tells you that you are at risk for this type of infection.  Your sexual activity has changed since you were last screened and you are at an increased risk for chlamydia or gonorrhea. Ask your health care provider if you are at risk.  If you do not have HIV, but are at risk, it may be recommended that you take a prescription medicine daily to prevent HIV infection. This is called  pre-exposure prophylaxis (PrEP). You are considered at risk if:  You are sexually active and do not regularly use condoms or know the HIV status of your partner(s).  You take drugs by injection.  You are sexually active with a partner who has HIV. Talk with your health care provider about whether you are at high risk of being infected with HIV. If you choose to begin PrEP, you should first be tested for  HIV. You should then be tested every 3 months for as long as you are taking PrEP.  PREGNANCY   If you are premenopausal and you may become pregnant, ask your health care provider about preconception counseling.  If you may become pregnant, take 400 to 800 micrograms (mcg) of folic acid every day.  If you want to prevent pregnancy, talk to your health care provider about birth control (contraception). OSTEOPOROSIS AND MENOPAUSE   Osteoporosis is a disease in which the bones lose minerals and strength with aging. This can result in serious bone fractures. Your risk for osteoporosis can be identified using a bone density scan.  If you are 65 years of age or older, or if you are at risk for osteoporosis and fractures, ask your health care provider if you should be screened.  Ask your health care provider whether you should take a calcium or vitamin D supplement to lower your risk for osteoporosis.  Menopause may have certain physical symptoms and risks.  Hormone replacement therapy may reduce some of these symptoms and risks. Talk to your health care provider about whether hormone replacement therapy is right for you.  HOME CARE INSTRUCTIONS   Schedule regular health, dental, and eye exams.  Stay current with your immunizations.   Do not use any tobacco products including cigarettes, chewing tobacco, or electronic cigarettes.  If you are pregnant, do not drink alcohol.  If you are breastfeeding, limit how much and how often you drink alcohol.  Limit alcohol intake to no more than 1  drink per day for nonpregnant women. One drink equals 12 ounces of beer, 5 ounces of wine, or 1 ounces of hard liquor.  Do not use street drugs.  Do not share needles.  Ask your health care provider for help if you need support or information about quitting drugs.  Tell your health care provider if you often feel depressed.  Tell your health care provider if you have ever been abused or do not feel safe at home.   This information is not intended to replace advice given to you by your health care provider. Make sure you discuss any questions you have with your health care provider.   Document Released: 11/15/2010 Document Revised: 05/23/2014 Document Reviewed: 04/03/2013 [employer] Interactive Patient Education 2016 [employer]  

## 2015-07-02 NOTE — Assessment & Plan Note (Addendum)
Preventative protocols reviewed and updated unless pt declined. Discussed healthy diet and lifestyle.  Flu shot today Declines breast/pelvic/pap - states will return for this in 3 mo Stool kit today - encouraged she return this year.

## 2015-07-02 NOTE — Assessment & Plan Note (Signed)
Denies anginal sxs at this time

## 2015-07-03 LAB — HEPATITIS C ANTIBODY: HCV Ab: NEGATIVE

## 2015-07-08 ENCOUNTER — Emergency Department (HOSPITAL_COMMUNITY)
Admission: EM | Admit: 2015-07-08 | Discharge: 2015-07-08 | Disposition: A | Discharge status: Home / Self Care | Payer: BLUE CROSS/BLUE SHIELD | Attending: Emergency Medicine | Admitting: Emergency Medicine

## 2015-07-08 ENCOUNTER — Encounter (HOSPITAL_COMMUNITY): Payer: Self-pay

## 2015-07-08 DIAGNOSIS — K0889 Other specified disorders of teeth and supporting structures: Secondary | ICD-10-CM | POA: Diagnosis present

## 2015-07-08 DIAGNOSIS — K219 Gastro-esophageal reflux disease without esophagitis: Secondary | ICD-10-CM | POA: Insufficient documentation

## 2015-07-08 DIAGNOSIS — Z8619 Personal history of other infectious and parasitic diseases: Secondary | ICD-10-CM | POA: Insufficient documentation

## 2015-07-08 DIAGNOSIS — Z794 Long term (current) use of insulin: Secondary | ICD-10-CM | POA: Insufficient documentation

## 2015-07-08 DIAGNOSIS — E119 Type 2 diabetes mellitus without complications: Secondary | ICD-10-CM | POA: Diagnosis not present

## 2015-07-08 DIAGNOSIS — Z7984 Long term (current) use of oral hypoglycemic drugs: Secondary | ICD-10-CM | POA: Diagnosis not present

## 2015-07-08 DIAGNOSIS — I2510 Atherosclerotic heart disease of native coronary artery without angina pectoris: Secondary | ICD-10-CM | POA: Diagnosis not present

## 2015-07-08 DIAGNOSIS — K047 Periapical abscess without sinus: Secondary | ICD-10-CM | POA: Insufficient documentation

## 2015-07-08 DIAGNOSIS — Z7982 Long term (current) use of aspirin: Secondary | ICD-10-CM | POA: Diagnosis not present

## 2015-07-08 DIAGNOSIS — E785 Hyperlipidemia, unspecified: Secondary | ICD-10-CM | POA: Insufficient documentation

## 2015-07-08 DIAGNOSIS — K029 Dental caries, unspecified: Secondary | ICD-10-CM | POA: Insufficient documentation

## 2015-07-08 DIAGNOSIS — Z8742 Personal history of other diseases of the female genital tract: Secondary | ICD-10-CM | POA: Insufficient documentation

## 2015-07-08 DIAGNOSIS — F329 Major depressive disorder, single episode, unspecified: Secondary | ICD-10-CM | POA: Diagnosis not present

## 2015-07-08 DIAGNOSIS — Z880 Allergy status to penicillin: Secondary | ICD-10-CM | POA: Insufficient documentation

## 2015-07-08 DIAGNOSIS — Z7902 Long term (current) use of antithrombotics/antiplatelets: Secondary | ICD-10-CM | POA: Diagnosis not present

## 2015-07-08 LAB — CBG MONITORING, ED: Glucose-Capillary: 229 mg/dL — ABNORMAL HIGH (ref 65–99)

## 2015-07-08 MED ORDER — METRONIDAZOLE 500 MG PO TABS
500.0000 mg | ORAL_TABLET | Freq: Once | ORAL | Status: AC
  Administered 2015-07-08: 500 mg via ORAL
  Filled 2015-07-08: qty 1

## 2015-07-08 MED ORDER — LIDOCAINE VISCOUS 2 % MT SOLN
15.0000 mL | Freq: Once | OROMUCOSAL | Status: AC
  Administered 2015-07-08: 15 mL via OROMUCOSAL
  Filled 2015-07-08: qty 15

## 2015-07-08 MED ORDER — CIPROFLOXACIN HCL 500 MG PO TABS: 500.0000 mg | ORAL_TABLET | Freq: Two times a day (BID) | ORAL | Status: DC

## 2015-07-08 MED ORDER — ONDANSETRON 4 MG PO TBDP: 4.0000 mg | ORAL_TABLET | Freq: Three times a day (TID) | ORAL | Status: DC | PRN

## 2015-07-08 MED ORDER — BUPIVACAINE-EPINEPHRINE (PF) 0.5% -1:200000 IJ SOLN
1.8000 mL | Freq: Once | INTRAMUSCULAR | Status: AC
  Administered 2015-07-08: 1.8 mL
  Filled 2015-07-08: qty 1.8

## 2015-07-08 MED ORDER — METRONIDAZOLE 500 MG PO TABS: 500.0000 mg | ORAL_TABLET | Freq: Two times a day (BID) | ORAL | Status: DC

## 2015-07-08 MED ORDER — ONDANSETRON 4 MG PO TBDP
4.0000 mg | ORAL_TABLET | Freq: Once | ORAL | Status: AC
  Administered 2015-07-08: 4 mg via ORAL
  Filled 2015-07-08: qty 1

## 2015-07-08 MED ORDER — HYDROCODONE-ACETAMINOPHEN 5-325 MG PO TABS: 1.0000 | ORAL_TABLET | Freq: Four times a day (QID) | ORAL | Status: DC | PRN

## 2015-07-08 MED ORDER — CIPROFLOXACIN HCL 500 MG PO TABS
500.0000 mg | ORAL_TABLET | Freq: Once | ORAL | Status: AC
  Administered 2015-07-08: 500 mg via ORAL
  Filled 2015-07-08: qty 1

## 2015-07-08 NOTE — Discharge Instructions (Signed)

## 2015-07-08 NOTE — ED Notes (Signed)
CBG [id number] 

## 2015-07-08 NOTE — ED Notes (Signed)
P.t having upper gum pain. Pt. Has a decayed top tooth.

## 2015-07-08 NOTE — ED Provider Notes (Signed)
CSN: [id number]     Arrival date & time 07/08/15  0848 History  By signing my name below, I, Eustaquio Maize, attest that this documentation has been prepared under the direction and in the presence of Delsa Grana, PA-C. Electronically Signed: Eustaquio Maize, ED Scribe. 07/08/2015. 10:26 AM.   Chief Complaint  Patient presents with  . Dental Pain   The history is provided by the patient. No language interpreter was used.     HPI Comments: Karen [name] is a 60 y.o. female who presents to the Emergency Department complaining of gradual onset, constant, 8/10, burning, front upper gum pain and swelling x 2 days. Pt also complains of chills and nausea last night. She has been taking Tramadol without relief. Denies drainage, foul taste in mouth, fever, throat swelling, trouble swallowing, shortness of breath, ear pain, facial pain, or any other associated symptoms.   She has been unable to see a dentist due to financial constraints.   Past Medical History  Diagnosis Date  . History of chicken pox   . Depression   . Type 2 diabetes, uncontrolled, with neuropathy ([place])     dx 1993  . Chronic bronchitis   . Syncopal episodes   . GERD (gastroesophageal reflux disease)   . Seasonal allergies   . High blood pressure   . HLD (hyperlipidemia)   . History of ovarian cyst   . Tubal pregnancy   . Tachyarrhythmia     ER visits with adenosine push to control  . CAD (coronary artery disease)     STEMI 07/2013 - treated with PTCA of the RCA followed by DES to the intermediate ramus  . Seizure ([place]) 1977    off AEDs   Past Surgical History  Procedure Laterality Date  . Tonsillectomy and adenoidectomy  1965  . Ovarian cyst surgery  1998  . Dilation and curettage of uterus  1984  . Ectopic pregnancy surgery Right 1984    tube removal 2/2 hemorrhage  . Rotator cuff repair  2006    Right  . Cardiac catheterization  07/2013    angioplasty to RCA and DES to ramus intermedius  . Left heart  catheterization with coronary angiogram N/A 07/23/2013    Procedure: LEFT HEART CATHETERIZATION WITH CORONARY ANGIOGRAM;  Surgeon: Sinclair Grooms, MD;  Location: [facility] CATH LAB;  Service: Cardiovascular;  Laterality: N/A;  . Percutaneous coronary stent intervention (pci-s) N/A 07/24/2013    Procedure: PERCUTANEOUS CORONARY STENT INTERVENTION (PCI-S);  Surgeon: Sinclair Grooms, MD;  Location: [facility] CATH LAB;  Service: Cardiovascular;  Laterality: N/A;  . Submandibular duct ligation Right 10/2014    R submandibular gland stone removed Erik Obey)   Family History  Problem Relation Age of Onset  . Diabetes Mother   . Hyperlipidemia Mother   . Hypertension Mother   . Stroke Mother   . Coronary artery disease Mother     CABG  . Heart disease Mother   . Diabetes Father   . Hypertension Father   . Hyperlipidemia Father   . Coronary artery disease Father     CABG  . Heart disease Father     CHF  . Pancreatic cancer Father   . Heart disease Brother     arrhythmia  . Diabetes Maternal Grandmother   . Diabetes Maternal Grandfather   . Diabetes Paternal Grandmother   . Diabetes Paternal Grandfather   . Kidney disease Neg Hx    Social History  Substance Use Topics  . Smoking  status: Never Smoker   . Smokeless tobacco: Never Used  . Alcohol Use: No   OB History    No data available     Review of Systems  Constitutional: Positive for chills. Negative for fever.  HENT: Positive for dental problem. Negative for ear pain and trouble swallowing.   Respiratory: Negative for shortness of breath.   Gastrointestinal: Positive for nausea.  All other systems reviewed and are negative.  Allergies  Clindamycin/lincomycin; Erythromycin; Lipitor; and Penicillins  Home Medications   Prior to Admission medications   Medication Sig Start Date End Date Taking? Authorizing Provider  acetaminophen (TYLENOL) 500 MG tablet Take 1 tablet (500 mg total) by mouth every 6 (six) hours as needed. 03/28/15    Antonietta Breach, PA-C  aspirin EC 81 MG EC tablet Take 1 tablet (81 mg total) by mouth daily. 07/25/13   Sueanne Margarita, MD  ciprofloxacin (CIPRO) 500 MG tablet Take 1 tablet (500 mg total) by mouth every 12 (twelve) hours. 07/08/15   Delsa Grana, PA-C  citalopram (CELEXA) 20 MG tablet Take 1 tablet (20 mg total) by mouth daily. 03/06/15   [name], MD  clopidogrel (PLAVIX) 75 MG tablet Take 1 tablet (75 mg total) by mouth daily. 06/10/15   [name], MD  gabapentin (NEURONTIN) 100 MG capsule Take 1 capsule (100 mg total) by mouth 2 (two) times daily. 07/02/15   [name], MD  guaiFENesin (ROBITUSSIN) 100 MG/5ML liquid Take 5-10 mLs (100-200 mg total) by mouth every 4 (four) hours as needed for cough. 05/11/15   Clayton Bibles, PA-C  HYDROcodone-acetaminophen (NORCO/VICODIN) 5-325 MG tablet Take 1-2 tablets by mouth every 6 (six) hours as needed. 07/08/15   Delsa Grana, PA-C  Insulin Glargine (LANTUS SOLOSTAR) 100 UNIT/ML Solostar Pen Inject 60 Units into the skin 2 (two) times daily. QS 1 mo 06/01/15   [name], MD  lisinopril (ZESTRIL) 2.5 MG tablet Take 1 tablet (2.5 mg total) by mouth daily. 10/07/14   [name], MD  loperamide (IMODIUM) 2 MG capsule Take 1 capsule (2 mg total) by mouth 4 (four) times daily as needed for diarrhea or loose stools. Patient not taking: Reported on 07/02/2015 11/26/14   Elmyra Ricks Pisciotta, PA-C  metFORMIN (GLUCOPHAGE) 1000 MG tablet Take 1 tablet (1,000 mg total) by mouth 2 (two) times daily with a meal. 08/14/14   [name], MD  methocarbamol (ROBAXIN) 500 MG tablet Take 1 tablet (500 mg total) by mouth 3 (three) times daily as needed for muscle spasms. Patient not taking: Reported on 07/02/2015 12/03/14   Lucille Passy, MD  metoprolol tartrate (LOPRESSOR) 25 MG tablet Take 0.5 tablets (12.5 mg total) by mouth 2 (two) times daily. 01/23/15   [name], MD  metroNIDAZOLE (FLAGYL) 500 MG tablet Take 1 tablet (500 mg total) by mouth 2 (two)  times daily. 07/08/15   Delsa Grana, PA-C  nitroGLYCERIN (NITROSTAT) 0.4 MG SL tablet Place 1 tablet (0.4 mg total) under the tongue every 5 (five) minutes as needed for chest pain. 07/02/15   [name], MD  ondansetron (ZOFRAN ODT) 4 MG disintegrating tablet Take 1 tablet (4 mg total) by mouth every 8 (eight) hours as needed for nausea or vomiting. 07/08/15   Delsa Grana, PA-C  pantoprazole (PROTONIX) 40 MG tablet Take 1 tablet (40 mg total) by mouth daily. 03/16/15   [name], MD  promethazine (PHENERGAN) 25 MG tablet Take 1 tablet (25 mg total) by mouth every 8 (eight) hours as needed for nausea or  vomiting. Patient not taking: Reported on 07/02/2015 02/01/15   Dalia Heading, PA-C  simvastatin (ZOCOR) 40 MG tablet TAKE 1 TABLET (40 MG TOTAL) BY MOUTH DAILY. 06/23/15   [name], MD  traMADol (ULTRAM) 50 MG tablet Take 1-2 tablets (50-100 mg total) by mouth every 12 (twelve) hours as needed for moderate pain. 06/24/15   [name], MD   BP 153/80 mmHg  Pulse 67  Temp(Src) 97.6 F (36.4 C) (Oral)  Resp 18  Ht 5\' 1"  (1.549 m)  Wt 78.926 kg  BMI 32.89 kg/m2  SpO2 100%   Physical Exam  Constitutional: She is oriented to person, place, and time. She appears well-developed and well-nourished. No distress.  HENT:  Head: Normocephalic and atraumatic.  Right Ear: Tympanic membrane and external ear normal.  Left Ear: Tympanic membrane and external ear normal.  Nose: Sinus tenderness present. No mucosal edema or rhinorrhea.    Mouth/Throat: Uvula is midline, oropharynx is clear and moist and mucous membranes are normal. Mucous membranes are not pale, not dry and not cyanotic. She does not have dentures. No oral lesions. No trismus in the jaw. Abnormal dentition. Dental abscesses and dental caries present. No uvula swelling. No oropharyngeal exudate, posterior oropharyngeal edema, posterior oropharyngeal erythema or tonsillar abscesses.    Firmness palpated on upper lip  inferior to nose.  No erythema or fluctuance of skin, no warmth  Swelling, tenderness, and fluctuation of alveolar gingiva superior to tooth #8 and tooth #9.  Tooth #9 is missing. Tooth #8 has dental decay.  No sublingual tenderness.    Eyes: Conjunctivae and EOM are normal. Pupils are equal, round, and reactive to light. Right eye exhibits no discharge. Left eye exhibits no discharge. No scleral icterus.  Neck: Normal range of motion. Neck supple. No JVD present. No tracheal deviation present.  No cervical lymphadenopathy  Cardiovascular: Normal rate, regular rhythm and normal heart sounds.   Pulmonary/Chest: Effort normal and breath sounds normal. No stridor. No respiratory distress.  Abdominal: Soft. Bowel sounds are normal. She exhibits no distension. There is no tenderness.  Musculoskeletal: Normal range of motion. She exhibits no edema.  Lymphadenopathy:    She has no cervical adenopathy.  Neurological: She is alert and oriented to person, place, and time. She exhibits normal muscle tone. Coordination normal.  Skin: Skin is warm and dry. No rash noted. She is not diaphoretic. No erythema. No pallor.  Psychiatric: She has a normal mood and affect. Her behavior is normal. Judgment and thought content normal.  Nursing note and vitals reviewed.   ED Course  Procedures (including critical care time)  NERVE BLOCK Date/Time:  07/08/15  Performed by: Dala Dock Authorized by: Delsa Grana R Consent: Verbal consent obtained. Risks and benefits: risks, benefits and alternatives were discussed Consent given by: patient Indications: pain relief Body area: tooth central upper incisors Laterality: central tooth # 8 & 9 Needle gauge: 27 G Local anesthetic: bupivacaine  Anesthetic total: 0.9 ml Outcome: pain improved Patient tolerance: Patient tolerated the procedure well with no immediate complications. Comments: Patient had complete relief of pain.     INCISION AND DRAINAGE  PROCEDURE NOTE: Patient identification was confirmed and verbal consent was obtained. This procedure was performed by Delsa Grana, PA-C at 11:50 AM. Site: Upper central incisor gums Sterile procedures observed Needle size: 27 guage Anesthetic used (type and amt): 0.9 mLs 0.5% Marcaine with Epi Blade size: 11 Drainage: Purulent Complexity: Complex Site anesthetized, incision made over site, wound drained and explored loculations,  rinsed with copious amounts of normal saline. Pt tolerated procedure well without complications.  Instructions for care discussed verbally and pt provided with additional written instructions for homecare and f/u.   DIAGNOSTIC STUDIES: Oxygen Saturation is 95% on RA, adequate by my interpretation.    COORDINATION OF CARE: 10:19 AM-Discussed treatment plan which includes nerve block and I&D for dental abscess, CBG, and Rx antibiotics with pt at bedside and pt agreed to plan.   11:37 AM - Consulted ED pharmacy on appropriate antibiotics to give to pt due to her multiple allergies. Will prescribe Flagyl and Cipro.   Labs Review Labs Reviewed  CBG MONITORING, ED - Abnormal; Notable for the following:    Glucose-Capillary 229 (*)    All other components within normal limits    Imaging Review No results found.    EKG Interpretation None      MDM   Pt with dental abscess Dental block was performed with adequate anesthesia and I&D was performed with purulent drainage.  Patient has multiple medical problems, CBG checked mildly elevated.  She has multiple medication allergies, the pharmacist with consulted and patient was started on Cipro and Flagyl.  Patient was otherwise well-appearing with no compromise of airway.  No trismus, no sublingual tenderness.  She had local swelling of the gingiva which is easily palpated through her upper lip and does not seem to have any spread of infection or cellulitis to the lip or face. She was given dental follow-up. She  was encouraged to also follow up with her PCP. She is discharged in good condition with stable vital signs Final diagnoses:  Dental abscess    I personally performed the services described in this documentation, which was scribed in my presence. The recorded information has been reviewed and is accurate.       Delsa Grana, PA-C 07/08/15 1259  Noemi Chapel, MD 07/08/15 [phone]

## 2015-07-16 ENCOUNTER — Encounter: Payer: Self-pay | Admitting: Family Medicine

## 2015-07-16 MED ORDER — METFORMIN HCL 1000 MG PO TABS: 1000.0000 mg | ORAL_TABLET | Freq: Two times a day (BID) | ORAL | Status: DC

## 2015-07-16 MED ORDER — GABAPENTIN 100 MG PO CAPS: 100.0000 mg | ORAL_CAPSULE | Freq: Two times a day (BID) | ORAL | Status: DC

## 2015-07-16 MED ORDER — INSULIN GLARGINE 100 UNIT/ML SOLOSTAR PEN: 60.0000 [IU] | PEN_INJECTOR | Freq: Two times a day (BID) | SUBCUTANEOUS | Status: DC

## 2015-07-24 ENCOUNTER — Other Ambulatory Visit: Payer: Self-pay | Admitting: *Deleted

## 2015-07-24 NOTE — Telephone Encounter (Signed)
Ok to refill 

## 2015-07-25 ENCOUNTER — Encounter: Payer: Self-pay | Admitting: Family Medicine

## 2015-07-27 ENCOUNTER — Emergency Department (HOSPITAL_COMMUNITY): Payer: BLUE CROSS/BLUE SHIELD

## 2015-07-27 ENCOUNTER — Encounter (HOSPITAL_COMMUNITY): Payer: Self-pay | Admitting: Vascular Surgery

## 2015-07-27 DIAGNOSIS — Y9289 Other specified places as the place of occurrence of the external cause: Secondary | ICD-10-CM | POA: Insufficient documentation

## 2015-07-27 DIAGNOSIS — W5501XA Bitten by cat, initial encounter: Secondary | ICD-10-CM | POA: Insufficient documentation

## 2015-07-27 DIAGNOSIS — Z794 Long term (current) use of insulin: Secondary | ICD-10-CM | POA: Insufficient documentation

## 2015-07-27 DIAGNOSIS — Z79899 Other long term (current) drug therapy: Secondary | ICD-10-CM | POA: Diagnosis not present

## 2015-07-27 DIAGNOSIS — Z880 Allergy status to penicillin: Secondary | ICD-10-CM | POA: Diagnosis not present

## 2015-07-27 DIAGNOSIS — K219 Gastro-esophageal reflux disease without esophagitis: Secondary | ICD-10-CM | POA: Insufficient documentation

## 2015-07-27 DIAGNOSIS — E785 Hyperlipidemia, unspecified: Secondary | ICD-10-CM | POA: Insufficient documentation

## 2015-07-27 DIAGNOSIS — I2510 Atherosclerotic heart disease of native coronary artery without angina pectoris: Secondary | ICD-10-CM | POA: Insufficient documentation

## 2015-07-27 DIAGNOSIS — S61432A Puncture wound without foreign body of left hand, initial encounter: Secondary | ICD-10-CM | POA: Diagnosis present

## 2015-07-27 DIAGNOSIS — Z7902 Long term (current) use of antithrombotics/antiplatelets: Secondary | ICD-10-CM | POA: Diagnosis not present

## 2015-07-27 DIAGNOSIS — Z23 Encounter for immunization: Secondary | ICD-10-CM | POA: Insufficient documentation

## 2015-07-27 DIAGNOSIS — S50812A Abrasion of left forearm, initial encounter: Secondary | ICD-10-CM | POA: Insufficient documentation

## 2015-07-27 DIAGNOSIS — Z8619 Personal history of other infectious and parasitic diseases: Secondary | ICD-10-CM | POA: Insufficient documentation

## 2015-07-27 DIAGNOSIS — Y998 Other external cause status: Secondary | ICD-10-CM | POA: Diagnosis not present

## 2015-07-27 DIAGNOSIS — Z7982 Long term (current) use of aspirin: Secondary | ICD-10-CM | POA: Diagnosis not present

## 2015-07-27 DIAGNOSIS — Y9389 Activity, other specified: Secondary | ICD-10-CM | POA: Diagnosis not present

## 2015-07-27 DIAGNOSIS — F329 Major depressive disorder, single episode, unspecified: Secondary | ICD-10-CM | POA: Diagnosis not present

## 2015-07-27 DIAGNOSIS — E1140 Type 2 diabetes mellitus with diabetic neuropathy, unspecified: Secondary | ICD-10-CM | POA: Diagnosis not present

## 2015-07-27 LAB — CBG MONITORING, ED: Glucose-Capillary: 314 mg/dL — ABNORMAL HIGH (ref 65–99)

## 2015-07-27 MED ORDER — TRAMADOL HCL 50 MG PO TABS: 50.0000 mg | ORAL_TABLET | Freq: Two times a day (BID) | ORAL | Status: DC | PRN

## 2015-07-27 NOTE — Telephone Encounter (Signed)
Rx called in as directed.   

## 2015-07-27 NOTE — Telephone Encounter (Signed)
wlll phone in today.

## 2015-07-27 NOTE — ED Notes (Addendum)
Pt reports to the ED for eval of animal bite and scratches. Pt reports she was petting a cat tonight and the cat scratched and bit her right hand. Unsure of vaccine status of cat. Pt is diabetic and on blood thinners. No erythema or swelling noted around the bite. Pt A&OX4, resp e/u, and skin warm and dry.

## 2015-07-27 NOTE — Telephone Encounter (Signed)
Called in as directed. 

## 2015-07-27 NOTE — Telephone Encounter (Signed)
plz phone in. 

## 2015-07-28 ENCOUNTER — Emergency Department (HOSPITAL_COMMUNITY)
Admission: EM | Admit: 2015-07-28 | Discharge: 2015-07-28 | Disposition: A | Discharge status: Home / Self Care | Payer: BLUE CROSS/BLUE SHIELD | Attending: Emergency Medicine | Admitting: Emergency Medicine

## 2015-07-28 DIAGNOSIS — W5501XA Bitten by cat, initial encounter: Secondary | ICD-10-CM

## 2015-07-28 MED ORDER — RABIES VACCINE, PCEC IM SUSR
1.0000 mL | Freq: Once | INTRAMUSCULAR | Status: AC
  Administered 2015-07-28: 1 mL via INTRAMUSCULAR
  Filled 2015-07-28: qty 1

## 2015-07-28 MED ORDER — RABIES IMMUNE GLOBULIN 150 UNIT/ML IM INJ
1500.0000 [IU] | INJECTION | Freq: Once | INTRAMUSCULAR | Status: AC
  Administered 2015-07-28: 1500 [IU]
  Filled 2015-07-28: qty 10

## 2015-07-28 MED ORDER — TETANUS-DIPHTH-ACELL PERTUSSIS 5-2.5-18.5 LF-MCG/0.5 IM SUSP
0.5000 mL | Freq: Once | INTRAMUSCULAR | Status: AC
  Administered 2015-07-28: 0.5 mL via INTRAMUSCULAR
  Filled 2015-07-28: qty 0.5

## 2015-07-28 MED ORDER — DOXYCYCLINE HYCLATE 100 MG PO CAPS: 100.0000 mg | ORAL_CAPSULE | Freq: Two times a day (BID) | ORAL | Status: DC

## 2015-07-28 NOTE — Discharge Instructions (Signed)
1. Medications: Please take all of your antibiotics until finished!, continue usual home medications 2. Treatment: keep affected area clean and dry.  3. Follow Up: Please follow up with your primary doctor or return to ER for the remaining rabies vaccine doses in 3, 7, 14, and 28 days; Please return to the ER for redness spreading around the bite wound, fever, new or worsening symptoms, any additional concerns.

## 2015-07-28 NOTE — ED Provider Notes (Signed)
CSN: [id number]     Arrival date & time 07/27/15  2147 History   First MD Initiated Contact with Patient 07/28/15 0601     Chief Complaint  Patient presents with  . Animal Bite     (Consider location/radiation/quality/duration/timing/severity/associated sxs/prior Treatment) Patient is a 60 y.o. female presenting with animal bite. The history is provided by the patient and medical records. No language interpreter was used.  Animal Bite Associated symptoms: no fever    Karen [name] is a 60 y.o. female  who presents to the Emergency Department for cat bite which occurred last night. Patient states that there was a cat in her window, and she went to pet it. The cat bit her left hand and scratched her left forearm. Minimal bleeding. Cat was not a known animal - unsure of owner - unsure of animal vaccine status. Tetanus status unknown. Patient states that she cleaned the wound with antibacterial soap and water prior to arrival. Admits to occasional left hand pain. Denies any other associated symptoms.   Past Medical History  Diagnosis Date  . History of chicken pox   . Depression   . Type 2 diabetes, uncontrolled, with neuropathy ([place])     dx 1993  . Chronic bronchitis   . Syncopal episodes   . GERD (gastroesophageal reflux disease)   . Seasonal allergies   . High blood pressure   . HLD (hyperlipidemia)   . History of ovarian cyst   . Tubal pregnancy   . Tachyarrhythmia     ER visits with adenosine push to control  . CAD (coronary artery disease)     STEMI 07/2013 - treated with PTCA of the RCA followed by DES to the intermediate ramus  . Seizure ([place]) 1977    off AEDs   Past Surgical History  Procedure Laterality Date  . Tonsillectomy and adenoidectomy  1965  . Ovarian cyst surgery  1998  . Dilation and curettage of uterus  1984  . Ectopic pregnancy surgery Right 1984    tube removal 2/2 hemorrhage  . Rotator cuff repair  2006    Right  . Cardiac catheterization  07/2013   angioplasty to RCA and DES to ramus intermedius  . Left heart catheterization with coronary angiogram N/A 07/23/2013    Procedure: LEFT HEART CATHETERIZATION WITH CORONARY ANGIOGRAM;  Surgeon: Sinclair Grooms, MD;  Location: [facility] CATH LAB;  Service: Cardiovascular;  Laterality: N/A;  . Percutaneous coronary stent intervention (pci-s) N/A 07/24/2013    Procedure: PERCUTANEOUS CORONARY STENT INTERVENTION (PCI-S);  Surgeon: Sinclair Grooms, MD;  Location: [facility] CATH LAB;  Service: Cardiovascular;  Laterality: N/A;  . Submandibular duct ligation Right 10/2014    R submandibular gland stone removed Erik Obey)   Family History  Problem Relation Age of Onset  . Diabetes Mother   . Hyperlipidemia Mother   . Hypertension Mother   . Stroke Mother   . Coronary artery disease Mother     CABG  . Heart disease Mother   . Diabetes Father   . Hypertension Father   . Hyperlipidemia Father   . Coronary artery disease Father     CABG  . Heart disease Father     CHF  . Pancreatic cancer Father   . Heart disease Brother     arrhythmia  . Diabetes Maternal Grandmother   . Diabetes Maternal Grandfather   . Diabetes Paternal Grandmother   . Diabetes Paternal Grandfather   . Kidney disease Neg Hx  Social History  Substance Use Topics  . Smoking status: Never Smoker   . Smokeless tobacco: Never Used  . Alcohol Use: No   OB History    No data available     Review of Systems  Constitutional: Negative for fever and chills.  HENT: Negative for congestion and sore throat.   Eyes: Negative for visual disturbance.  Respiratory: Negative for cough and shortness of breath.   Cardiovascular: Negative.   Gastrointestinal: Negative for abdominal pain.  Genitourinary: Negative for dysuria.  Musculoskeletal: Negative for back pain and neck pain.  Skin: Positive for wound.  Neurological: Negative for headaches.      Allergies  Clindamycin/lincomycin; Erythromycin; Lipitor; and Penicillins  Home  Medications   Prior to Admission medications   Medication Sig Start Date End Date Taking? Authorizing Provider  acetaminophen (TYLENOL) 500 MG tablet Take 1 tablet (500 mg total) by mouth every 6 (six) hours as needed. 03/28/15  Yes Antonietta Breach, PA-C  aspirin EC 81 MG EC tablet Take 1 tablet (81 mg total) by mouth daily. 07/25/13  Yes Sueanne Margarita, MD  citalopram (CELEXA) 20 MG tablet Take 1 tablet (20 mg total) by mouth daily. 03/06/15  Yes [name], MD  clopidogrel (PLAVIX) 75 MG tablet Take 1 tablet (75 mg total) by mouth daily. 06/10/15  Yes [name], MD  gabapentin (NEURONTIN) 100 MG capsule Take 1 capsule (100 mg total) by mouth 2 (two) times daily. 07/16/15  Yes [name], MD  Insulin Glargine (LANTUS SOLOSTAR) 100 UNIT/ML Solostar Pen Inject 60 Units into the skin 2 (two) times daily. QS 1 mo 07/16/15  Yes [name], MD  lisinopril (ZESTRIL) 2.5 MG tablet Take 1 tablet (2.5 mg total) by mouth daily. 10/07/14  Yes [name], MD  metFORMIN (GLUCOPHAGE) 1000 MG tablet Take 1 tablet (1,000 mg total) by mouth 2 (two) times daily with a meal. 07/16/15  Yes [name], MD  metoprolol tartrate (LOPRESSOR) 25 MG tablet Take 0.5 tablets (12.5 mg total) by mouth 2 (two) times daily. 01/23/15  Yes [name], MD  nitroGLYCERIN (NITROSTAT) 0.4 MG SL tablet Place 1 tablet (0.4 mg total) under the tongue every 5 (five) minutes as needed for chest pain. 07/02/15  Yes [name], MD  pantoprazole (PROTONIX) 40 MG tablet Take 1 tablet (40 mg total) by mouth daily. 03/16/15  Yes [name], MD  simvastatin (ZOCOR) 40 MG tablet TAKE 1 TABLET (40 MG TOTAL) BY MOUTH DAILY. 06/23/15  Yes [name], MD  traMADol (ULTRAM) 50 MG tablet Take 1-2 tablets (50-100 mg total) by mouth every 12 (twelve) hours as needed for moderate pain. 07/27/15  Yes [name], MD  doxycycline (VIBRAMYCIN) 100 MG capsule Take 1 capsule (100 mg total) by mouth 2 (two) times  daily. 07/28/15   Karen [name] Karen Resendes, PA-C   BP 135/68 mmHg  Pulse 71  Temp(Src) 99.2 F (37.3 C) (Oral)  Resp 14  Wt 78.926 kg  SpO2 98% Physical Exam  Constitutional: She is oriented to person, place, and time. She appears well-developed and well-nourished.  Alert and in no acute distress  HENT:  Head: Normocephalic and atraumatic.  Cardiovascular: Normal rate, regular rhythm and normal heart sounds.  Exam reveals no gallop and no friction rub.   No murmur heard. Pulmonary/Chest: Effort normal and breath sounds normal. No respiratory distress. She has no wheezes. She has no rales.  Musculoskeletal: She exhibits no edema.  Neurological: She is alert and oriented to person, place, and time.  Skin: Skin is warm and dry.  Superficial abrasion to left distal forearm. Puncture cat bite of webspace between left thumb and index finger. Bleeding controlled. No surrounding erythema. 2+ radial pulse. Sensation intact.   Psychiatric: She has a normal mood and affect. Her behavior is normal. Judgment and thought content normal.  Nursing note and vitals reviewed.   ED Course  Procedures (including critical care time) Labs Review Labs Reviewed  CBG MONITORING, ED - Abnormal; Notable for the following:    Glucose-Capillary 314 (*)    All other components within normal limits    Imaging Review Dg Hand Complete Right  07/27/2015  CLINICAL DATA:  Cat bit and scratched patient's right hand. Initial encounter. EXAM: RIGHT HAND - COMPLETE 3+ VIEW COMPARISON:  Right hand radiographs performed 01/26/2014 FINDINGS: There is no evidence of fracture or dislocation. The joint spaces are preserved. The carpal rows are intact, and demonstrate normal alignment. Known soft tissue lacerations are not well characterized on radiograph. No radiopaque foreign bodies are seen. Mild negative ulnar variance is noted. IMPRESSION: No evidence of fracture or dislocation. Electronically Signed   By: Garald Balding M.D.    On: 07/27/2015 23:00   I have personally reviewed and evaluated these images and lab results as part of my medical decision-making.   EKG Interpretation None      MDM   Final diagnoses:  Cat bite, initial encounter   AMARIYANA MEINEKE presents for cat bite which occurred last night. Vaccine status of the cat was unknown. Last tetanus shot unknown. We will update patient's tetanus today, and provide rabies prophylaxis. Wound was thoroughly cleaned in ED. Significant time was taken to educate patient on follow up instructions for rabies prophylaxis. PCP follow up also encouraged. Given patient's allergies, will treat with doxycycline. Return precautions and home care instructions given. All questions answered.   [facility] Oretha Weismann, PA-C 07/28/15 [id number]  Merryl Hacker, MD 07/28/15 [phone]

## 2015-07-29 ENCOUNTER — Other Ambulatory Visit: Payer: Self-pay | Admitting: *Deleted

## 2015-07-29 DIAGNOSIS — E785 Hyperlipidemia, unspecified: Secondary | ICD-10-CM

## 2015-07-29 MED ORDER — LISINOPRIL 2.5 MG PO TABS: 2.5000 mg | ORAL_TABLET | Freq: Every day | ORAL | Status: DC

## 2015-07-29 MED ORDER — CITALOPRAM HYDROBROMIDE 20 MG PO TABS: 20.0000 mg | ORAL_TABLET | Freq: Every day | ORAL | Status: DC

## 2015-07-29 MED ORDER — METOPROLOL TARTRATE 25 MG PO TABS: 12.5000 mg | ORAL_TABLET | Freq: Two times a day (BID) | ORAL | Status: DC

## 2015-07-31 ENCOUNTER — Encounter (HOSPITAL_COMMUNITY): Payer: Self-pay

## 2015-07-31 ENCOUNTER — Emergency Department (INDEPENDENT_AMBULATORY_CARE_PROVIDER_SITE_OTHER)
Admission: EM | Admit: 2015-07-31 | Discharge: 2015-07-31 | Disposition: A | Discharge status: Home / Self Care | Payer: BLUE CROSS/BLUE SHIELD | Source: Home / Self Care

## 2015-07-31 DIAGNOSIS — Z203 Contact with and (suspected) exposure to rabies: Secondary | ICD-10-CM | POA: Diagnosis not present

## 2015-07-31 MED ORDER — RABIES VACCINE, PCEC IM SUSR
1.0000 mL | Freq: Once | INTRAMUSCULAR | Status: AC
  Administered 2015-07-31: 1 mL via INTRAMUSCULAR

## 2015-07-31 MED ORDER — RABIES VACCINE, PCEC IM SUSR
INTRAMUSCULAR | Status: AC
  Filled 2015-07-31: qty 1

## 2015-07-31 NOTE — Discharge Instructions (Signed)
Patient is to return on 08/04/2015 for day 7

## 2015-07-31 NOTE — ED Notes (Signed)
Patient here for day 3 rabies vaccination  No new concerns

## 2015-08-04 ENCOUNTER — Encounter (HOSPITAL_COMMUNITY): Payer: Self-pay | Admitting: Emergency Medicine

## 2015-08-04 ENCOUNTER — Emergency Department (INDEPENDENT_AMBULATORY_CARE_PROVIDER_SITE_OTHER)
Admission: EM | Admit: 2015-08-04 | Discharge: 2015-08-04 | Disposition: A | Discharge status: Home / Self Care | Payer: BLUE CROSS/BLUE SHIELD | Source: Home / Self Care

## 2015-08-04 DIAGNOSIS — Z203 Contact with and (suspected) exposure to rabies: Secondary | ICD-10-CM

## 2015-08-04 MED ORDER — RABIES VACCINE, PCEC IM SUSR
INTRAMUSCULAR | Status: AC
  Filled 2015-08-04: qty 1

## 2015-08-04 MED ORDER — RABIES VACCINE, PCEC IM SUSR
1.0000 mL | Freq: Once | INTRAMUSCULAR | Status: AC
  Administered 2015-08-04: 1 mL via INTRAMUSCULAR

## 2015-08-04 NOTE — ED Notes (Signed)
Patient seen in ed for cat bite on 3/13.  Patient at [facility] today for day #7 in rabies series.

## 2015-08-11 ENCOUNTER — Emergency Department (INDEPENDENT_AMBULATORY_CARE_PROVIDER_SITE_OTHER)
Admission: EM | Admit: 2015-08-11 | Discharge: 2015-08-11 | Disposition: A | Discharge status: Home / Self Care | Payer: BLUE CROSS/BLUE SHIELD | Source: Home / Self Care

## 2015-08-11 ENCOUNTER — Encounter (HOSPITAL_COMMUNITY): Payer: Self-pay | Admitting: *Deleted

## 2015-08-11 DIAGNOSIS — Z203 Contact with and (suspected) exposure to rabies: Secondary | ICD-10-CM | POA: Diagnosis not present

## 2015-08-11 MED ORDER — RABIES VACCINE, PCEC IM SUSR
1.0000 mL | Freq: Once | INTRAMUSCULAR | Status: AC
  Administered 2015-08-11: 1 mL via INTRAMUSCULAR

## 2015-08-11 MED ORDER — RABIES VACCINE, PCEC IM SUSR
INTRAMUSCULAR | Status: AC
  Filled 2015-08-11: qty 1

## 2015-08-11 NOTE — ED Notes (Signed)
Followup  As  Directed       Return  Sooner  If  Needed

## 2015-08-11 NOTE — ED Notes (Signed)
Pt    Here     For  Rabies      Vaccine        Shot  Number  4   Day  14     Pt  Verbalizes  No  Complaints

## 2015-08-17 ENCOUNTER — Other Ambulatory Visit: Payer: Self-pay | Admitting: *Deleted

## 2015-08-17 ENCOUNTER — Encounter: Payer: Self-pay | Admitting: Family Medicine

## 2015-08-17 MED ORDER — CLOPIDOGREL BISULFATE 75 MG PO TABS: 75.0000 mg | ORAL_TABLET | Freq: Every day | ORAL | Status: DC

## 2015-08-18 ENCOUNTER — Other Ambulatory Visit: Payer: Self-pay | Admitting: *Deleted

## 2015-08-18 MED ORDER — PEN NEEDLES 31G X 8 MM MISC: Status: DC

## 2015-08-27 ENCOUNTER — Other Ambulatory Visit: Payer: Self-pay | Admitting: *Deleted

## 2015-08-27 MED ORDER — SIMVASTATIN 40 MG PO TABS: ORAL_TABLET | ORAL | Status: DC

## 2015-09-12 ENCOUNTER — Encounter (HOSPITAL_COMMUNITY): Payer: Self-pay

## 2015-09-12 ENCOUNTER — Emergency Department (HOSPITAL_COMMUNITY): Payer: BLUE CROSS/BLUE SHIELD

## 2015-09-12 ENCOUNTER — Emergency Department (HOSPITAL_COMMUNITY)
Admission: EM | Admit: 2015-09-12 | Discharge: 2015-09-13 | Disposition: A | Discharge status: Home / Self Care | Payer: BLUE CROSS/BLUE SHIELD | Attending: Emergency Medicine | Admitting: Emergency Medicine

## 2015-09-12 DIAGNOSIS — E785 Hyperlipidemia, unspecified: Secondary | ICD-10-CM | POA: Diagnosis not present

## 2015-09-12 DIAGNOSIS — F329 Major depressive disorder, single episode, unspecified: Secondary | ICD-10-CM | POA: Diagnosis not present

## 2015-09-12 DIAGNOSIS — Z8742 Personal history of other diseases of the female genital tract: Secondary | ICD-10-CM | POA: Diagnosis not present

## 2015-09-12 DIAGNOSIS — Z880 Allergy status to penicillin: Secondary | ICD-10-CM | POA: Insufficient documentation

## 2015-09-12 DIAGNOSIS — E1140 Type 2 diabetes mellitus with diabetic neuropathy, unspecified: Secondary | ICD-10-CM | POA: Insufficient documentation

## 2015-09-12 DIAGNOSIS — R002 Palpitations: Secondary | ICD-10-CM

## 2015-09-12 DIAGNOSIS — Z7982 Long term (current) use of aspirin: Secondary | ICD-10-CM | POA: Insufficient documentation

## 2015-09-12 DIAGNOSIS — Z7902 Long term (current) use of antithrombotics/antiplatelets: Secondary | ICD-10-CM | POA: Diagnosis not present

## 2015-09-12 DIAGNOSIS — Z794 Long term (current) use of insulin: Secondary | ICD-10-CM | POA: Diagnosis not present

## 2015-09-12 DIAGNOSIS — Z9889 Other specified postprocedural states: Secondary | ICD-10-CM | POA: Insufficient documentation

## 2015-09-12 DIAGNOSIS — I2510 Atherosclerotic heart disease of native coronary artery without angina pectoris: Secondary | ICD-10-CM | POA: Diagnosis not present

## 2015-09-12 DIAGNOSIS — Z8709 Personal history of other diseases of the respiratory system: Secondary | ICD-10-CM | POA: Diagnosis not present

## 2015-09-12 DIAGNOSIS — Z79899 Other long term (current) drug therapy: Secondary | ICD-10-CM | POA: Insufficient documentation

## 2015-09-12 DIAGNOSIS — K219 Gastro-esophageal reflux disease without esophagitis: Secondary | ICD-10-CM | POA: Diagnosis not present

## 2015-09-12 DIAGNOSIS — Z7984 Long term (current) use of oral hypoglycemic drugs: Secondary | ICD-10-CM | POA: Insufficient documentation

## 2015-09-12 DIAGNOSIS — Z8619 Personal history of other infectious and parasitic diseases: Secondary | ICD-10-CM | POA: Diagnosis not present

## 2015-09-12 DIAGNOSIS — I252 Old myocardial infarction: Secondary | ICD-10-CM | POA: Insufficient documentation

## 2015-09-12 DIAGNOSIS — I471 Supraventricular tachycardia: Secondary | ICD-10-CM | POA: Insufficient documentation

## 2015-09-12 LAB — I-STAT CHEM 8, ED
BUN: 17 mg/dL (ref 6–20)
CALCIUM ION: 1.16 mmol/L (ref 1.12–1.23)
CREATININE: 0.6 mg/dL (ref 0.44–1.00)
Chloride: 100 mmol/L — ABNORMAL LOW (ref 101–111)
Glucose, Bld: 318 mg/dL — ABNORMAL HIGH (ref 65–99)
HCT: 46 % (ref 36.0–46.0)
HEMOGLOBIN: 15.6 g/dL — AB (ref 12.0–15.0)
Potassium: 4 mmol/L (ref 3.5–5.1)
SODIUM: 136 mmol/L (ref 135–145)
TCO2: 25 mmol/L (ref 0–100)

## 2015-09-12 LAB — I-STAT TROPONIN, ED: Troponin i, poc: 0.01 ng/mL (ref 0.00–0.08)

## 2015-09-12 NOTE — ED Notes (Signed)
PER EMS: pt from home. Pt called out for tightness to her chest associated with SOB. Initial HR was 180 SVT. 6 mg adenosine given without conversion so 12 mg of adenosine given and she then converted back to sinus tach at a rate of 100. Hx of SVT and adenosine administration. BP- 134/87, CBG-383 and has not taken nightly dose of insulin. Pt arrived CP/SOB free.

## 2015-09-12 NOTE — ED Provider Notes (Signed)
CSN: [id number]     Arrival date & time 09/12/15  2000 History   First MD Initiated Contact with Patient 09/12/15 2007     Chief Complaint  Patient presents with  . Irregular Heart Beat     (Consider location/radiation/quality/duration/timing/severity/associated sxs/prior Treatment) HPI Comments: 60 year old female with history of diabetes, depression, SVT, CAD presents for elevated heart rate and chest tightness. This started this evening similar to multiple previous episodes for which she has received treatment for. Patient received 6 mg adenosine followed by 12 mg which put her back in a normal sinus rhythm. Patient has no symptoms currently. Patient is felt well otherwise recently. No fevers. Currently no shortness breath or chest pain. Patient has outpatient health cardiology.  The history is provided by the patient.    Past Medical History  Diagnosis Date  . History of chicken pox   . Depression   . Type 2 diabetes, uncontrolled, with neuropathy ([place])     dx 1993  . Chronic bronchitis   . Syncopal episodes   . GERD (gastroesophageal reflux disease)   . Seasonal allergies   . High blood pressure   . HLD (hyperlipidemia)   . History of ovarian cyst   . Tubal pregnancy   . Tachyarrhythmia     ER visits with adenosine push to control  . CAD (coronary artery disease)     STEMI 07/2013 - treated with PTCA of the RCA followed by DES to the intermediate ramus  . Seizure ([place]) 1977    off AEDs   Past Surgical History  Procedure Laterality Date  . Tonsillectomy and adenoidectomy  1965  . Ovarian cyst surgery  1998  . Dilation and curettage of uterus  1984  . Ectopic pregnancy surgery Right 1984    tube removal 2/2 hemorrhage  . Rotator cuff repair  2006    Right  . Cardiac catheterization  07/2013    angioplasty to RCA and DES to ramus intermedius  . Left heart catheterization with coronary angiogram N/A 07/23/2013    Procedure: LEFT HEART CATHETERIZATION WITH CORONARY  ANGIOGRAM;  Surgeon: Sinclair Grooms, MD;  Location: [facility] CATH LAB;  Service: Cardiovascular;  Laterality: N/A;  . Percutaneous coronary stent intervention (pci-s) N/A 07/24/2013    Procedure: PERCUTANEOUS CORONARY STENT INTERVENTION (PCI-S);  Surgeon: Sinclair Grooms, MD;  Location: [facility] C[facility] LAB;  Service: Cardiovascular;  Laterality: N/A;  . Submandibular duct ligation Right 10/2014    R submandibular gland stone removed Erik Obey)   Family History  Problem Relation Age of Onset  . Diabetes Mother   . Hyperlipidemia Mother   . Hypertension Mother   . Stroke Mother   . Coronary artery disease Mother     CABG  . Heart disease Mother   . Diabetes Father   . Hypertension Father   . Hyperlipidemia Father   . Coronary artery disease Father     CABG  . Heart disease Father     CHF  . Pancreatic cancer Father   . Heart disease Brother     arrhythmia  . Diabetes Maternal Grandmother   . Diabetes Maternal Grandfather   . Diabetes Paternal Grandmother   . Diabetes Paternal Grandfather   . Kidney disease Neg Hx    Social History  Substance Use Topics  . Smoking status: Never Smoker   . Smokeless tobacco: Never Used  . Alcohol Use: No   OB History    No data available     Review of  Systems  Constitutional: Negative for fever and chills.  HENT: Negative for congestion.   Eyes: Negative for visual disturbance.  Respiratory: Positive for chest tightness. Negative for shortness of breath.   Cardiovascular: Positive for palpitations. Negative for chest pain.  Gastrointestinal: Negative for vomiting and abdominal pain.  Genitourinary: Negative for dysuria and flank pain.  Musculoskeletal: Negative for back pain, neck pain and neck stiffness.  Skin: Negative for rash.  Neurological: Negative for light-headedness and headaches.      Allergies  Clindamycin/lincomycin; Erythromycin; Lipitor; and Penicillins  Home Medications   Prior to Admission medications   Medication Sig  Start Date End Date Taking? Authorizing Provider  acetaminophen (TYLENOL) 500 MG tablet Take 1 tablet (500 mg total) by mouth every 6 (six) hours as needed. Patient taking differently: Take 1,000 mg by mouth at bedtime as needed for moderate pain.  03/28/15  Yes Antonietta Breach, PA-C  aspirin EC 81 MG EC tablet Take 1 tablet (81 mg total) by mouth daily. 07/25/13  Yes Sueanne Margarita, MD  citalopram (CELEXA) 20 MG tablet Take 1 tablet (20 mg total) by mouth daily. Patient taking differently: Take 20 mg by mouth at bedtime.  07/29/15  Yes [name], MD  clopidogrel (PLAVIX) 75 MG tablet Take 1 tablet (75 mg total) by mouth daily. Patient taking differently: Take 75 mg by mouth at bedtime.  08/17/15  Yes [name], MD  gabapentin (NEURONTIN) 100 MG capsule Take 1 capsule (100 mg total) by mouth 2 (two) times daily. Patient taking differently: Take 200 mg by mouth 2 (two) times daily.  07/16/15  Yes [name], MD  Insulin Glargine (LANTUS SOLOSTAR) 100 UNIT/ML Solostar Pen Inject 60 Units into the skin 2 (two) times daily. QS 1 mo 07/16/15  Yes [name], MD  Insulin Pen Needle (PEN NEEDLES) 31G X 8 MM MISC Use to inject Lantus as directed twice daily. Dx: E11.40 08/18/15  Yes [name], MD  lisinopril (ZESTRIL) 2.5 MG tablet Take 1 tablet (2.5 mg total) by mouth daily. 07/29/15  Yes [name], MD  metFORMIN (GLUCOPHAGE) 1000 MG tablet Take 1 tablet (1,000 mg total) by mouth 2 (two) times daily with a meal. 07/16/15  Yes [name], MD  metoprolol tartrate (LOPRESSOR) 25 MG tablet Take 0.5 tablets (12.5 mg total) by mouth 2 (two) times daily. 07/29/15  Yes [name], MD  nitroGLYCERIN (NITROSTAT) 0.4 MG SL tablet Place 1 tablet (0.4 mg total) under the tongue every 5 (five) minutes as needed for chest pain. 07/02/15  Yes [name], MD  pantoprazole (PROTONIX) 40 MG tablet Take 1 tablet (40 mg total) by mouth daily. Patient taking differently: Take 40 mg by  mouth at bedtime.  03/16/15  Yes [name], MD  promethazine (PHENERGAN) 25 MG tablet Take 1 tablet by mouth. 09/03/15  Yes Historical Provider, MD  simvastatin (ZOCOR) 40 MG tablet TAKE 1 TABLET (40 MG TOTAL) BY MOUTH DAILY. Patient taking differently: Take 40 mg by mouth daily at 6 PM.  08/27/15  Yes [name], MD  traMADol (ULTRAM) 50 MG tablet Take 1-2 tablets (50-100 mg total) by mouth every 12 (twelve) hours as needed for moderate pain. 07/27/15  Yes [name], MD   BP 121/72 mmHg  Pulse 94  Temp(Src) 99 F (37.2 C) (Oral)  Resp 12  SpO2 98% Physical Exam  Constitutional: She is oriented to person, place, and time. She appears well-developed and well-nourished.  HENT:  Head: Normocephalic and atraumatic.  Eyes: Conjunctivae are  normal. Right eye exhibits no discharge. Left eye exhibits no discharge.  Neck: Normal range of motion. Neck supple. No tracheal deviation present.  Cardiovascular: Regular rhythm.  Tachycardia present.   Pulmonary/Chest: Effort normal and breath sounds normal.  Abdominal: Soft. She exhibits no distension. There is no tenderness. There is no guarding.  Musculoskeletal: She exhibits no edema.  Neurological: She is alert and oriented to person, place, and time.  Skin: Skin is warm. No rash noted.  Psychiatric: She has a normal mood and affect.  Nursing note and vitals reviewed.   ED Course  Procedures (including critical care time) Labs Review Labs Reviewed  I-STAT CHEM 8, ED - Abnormal; Notable for the following:    Chloride 100 (*)    Glucose, Bld 318 (*)    Hemoglobin 15.6 (*)    All other components within normal limits  I-STAT TROPOININ, ED  I-STAT TROPOININ, ED    Imaging Review Dg Chest Portable 1 View  09/12/2015  CLINICAL DATA:  Chest tightness and shortness of breath which began this evening EXAM: PORTABLE CHEST 1 VIEW COMPARISON:  03/23/2015 FINDINGS: The heart size and mediastinal contours are within normal limits.  Both lungs are clear. The visualized skeletal structures are unremarkable. IMPRESSION: No active disease. Electronically Signed   By: Kerby Moors M.D.   On: 09/12/2015 22:40   I have personally reviewed and evaluated these images and lab results as part of my medical decision-making.   EKG Interpretation   Date/Time:  Saturday September 12 2015 20:09:01 EDT Ventricular Rate:  103 PR Interval:  141 QRS Duration: 71 QT Interval:  337 QTC Calculation: 441 R Axis:   20 Text Interpretation:  Sinus tachycardia Low voltage, precordial leads  Confirmed by Jazelle Achey  MD, Trystin Terhune ([id number]) on 09/12/2015 8:23:04 PM      MDM   Final diagnoses:  SVT (supraventricular tachycardia) ([place])  Palpitations   Patient presents after palpitations and chest tightness that resolved after treatment.  Rhythm strips reviewed heart rate 188, regular, narrow, consistent with SVT. EKG on arrival reviewed mild tachycardia no acute findings. Patient isn't to manic. Plan for cardiac screen and outpatient follow up with cardiology. Patient asymptomatic clubbing observed in the ER. Plan for delta troponin, chest x-ray and outpatient follow-up.  Results and differential diagnosis were discussed with the patient/parent/guardian. Xrays were independently reviewed by myself.  Close follow up outpatient was discussed, comfortable with the plan.   Medications - No data to display  Filed Vitals:   09/12/15 2145 09/12/15 2215 09/12/15 2230 09/12/15 2245  BP: 163/89 139/80 122/72 121/72  Pulse: 105 107 105 94  Temp:      TempSrc:      Resp: 16 23 21 12   SpO2: 97% 95% 97% 98%    Final diagnoses:  SVT (supraventricular tachycardia) (H[facility])  Palpitations         Elnora Morrison, MD 09/13/15 0008

## 2015-09-13 LAB — I-STAT TROPONIN, ED: Troponin i, poc: 0.03 ng/mL (ref 0.00–0.08)

## 2015-09-13 NOTE — ED Notes (Signed)
Pt A&OX4, ambulatory at d/c with steady gait, NAD and states she has all of her belongings. Pt wheeled out of ED via wheelchair for comfort.

## 2015-09-13 NOTE — Discharge Instructions (Signed)
If you were given medicines take as directed.  If you are on coumadin or contraceptives realize their levels and effectiveness is altered by many different medicines.  If you have any reaction (rash, tongues swelling, other) to the medicines stop taking and see a physician.    If your blood pressure was elevated in the ER make sure you follow up for management with a primary doctor or return for chest pain, shortness of breath or stroke symptoms.  Please follow up as directed and return to the ER or see a physician for new or worsening symptoms.  Thank you. Filed Vitals:   09/12/15 2145 09/12/15 2215 09/12/15 2230 09/12/15 2245  BP: 163/89 139/80 122/72 121/72  Pulse: 105 107 105 94  Temp:      TempSrc:      Resp: 16 23 21 12   SpO2: 97% 95% 97% 98%

## 2015-09-15 ENCOUNTER — Ambulatory Visit (INDEPENDENT_AMBULATORY_CARE_PROVIDER_SITE_OTHER): Payer: BLUE CROSS/BLUE SHIELD | Admitting: Physician Assistant

## 2015-09-15 ENCOUNTER — Encounter: Payer: Self-pay | Admitting: Physician Assistant

## 2015-09-15 VITALS — BP 120/58 | HR 64 | Ht 61.0 in | Wt 176.6 lb

## 2015-09-15 DIAGNOSIS — I471 Supraventricular tachycardia, unspecified: Secondary | ICD-10-CM | POA: Insufficient documentation

## 2015-09-15 DIAGNOSIS — I10 Essential (primary) hypertension: Secondary | ICD-10-CM

## 2015-09-15 DIAGNOSIS — E785 Hyperlipidemia, unspecified: Secondary | ICD-10-CM

## 2015-09-15 DIAGNOSIS — I2510 Atherosclerotic heart disease of native coronary artery without angina pectoris: Secondary | ICD-10-CM

## 2015-09-15 DIAGNOSIS — R079 Chest pain, unspecified: Secondary | ICD-10-CM

## 2015-09-15 NOTE — Progress Notes (Signed)
Cardiology Office Note:    Date:  09/15/2015   ID:  Karen [name], DOB [birth year], MRN [id number]  PCP:  Karen Bush, MD  Cardiologist:  Dr. Daneen Schick   Electrophysiologist:  n/a  Referring MD: Karen Bush, MD   Chief Complaint  Patient presents with  . Hospitalization Follow-up    ED visit for SVT    History of Present Illness:     Karen [name] is a 60 y.o. female with a hx of CAD s/p STEMI tx with POBA to RCA and DES to in RI in 2015, HTN, HL, DM2, PSVT, seizure d/o.  Last seen by Dr. Tamala Julian 5/15.  Patient seen in the emergency room 09/12/15 with SVT that converted to NSR with adenosine 6 mg. Cardiac enzymes remained negative. Provider note from the ED documents that her heart rate was in the 180s. No ECGs or strips are available for review in the patient's chart. FU ECG with Sinus tachycardia, HR 103, no acute changes.  Returns for FU.  Here alone.  She tells me that she has had 4-5 episodes of SVT over the past few years. Symptoms usually involve rapid heartbeats with associated chest tightness and dyspnea. She has tried vagal maneuvers without success. Her history includes significant intolerance to higher doses of beta blocker. She denies syncope. Over the past few months, she has noted increasing dyspnea with exertion. She's also noted occasional episodes of chest discomfort. These are nonexertional. She did take nitroglycerin on one occasion. Chest symptoms are not associated with dyspnea, nausea or diaphoresis.  Past Medical History  Diagnosis Date  . History of chicken pox   . Depression   . Type 2 diabetes, uncontrolled, with neuropathy ([place])     dx 1993  . Chronic bronchitis   . Syncopal episodes   . GERD (gastroesophageal reflux disease)   . Seasonal allergies   . High blood pressure   . HLD (hyperlipidemia)   . History of ovarian cyst   . Tubal pregnancy   . Tachyarrhythmia     ER visits with adenosine push to control  . CAD (coronary artery disease)      STEMI 07/2013 - treated with PTCA of the RCA followed by DES to the intermediate ramus  . Seizure ([place]) 1977    off AEDs    Past Surgical History  Procedure Laterality Date  . Tonsillectomy and adenoidectomy  1965  . Ovarian cyst surgery  1998  . Dilation and curettage of uterus  1984  . Ectopic pregnancy surgery Right 1984    tube removal 2/2 hemorrhage  . Rotator cuff repair  2006    Right  . Cardiac catheterization  07/2013    angioplasty to RCA and DES to ramus intermedius  . Left heart catheterization with coronary angiogram N/A 07/23/2013    Procedure: LEFT HEART CATHETERIZATION WITH CORONARY ANGIOGRAM;  Surgeon: Sinclair Grooms, MD;  Location: [facility] CATH LAB;  Service: Cardiovascular;  Laterality: N/A;  . Percutaneous coronary stent intervention (pci-s) N/A 07/24/2013    Procedure: PERCUTANEOUS CORONARY STENT INTERVENTION (PCI-S);  Surgeon: Sinclair Grooms, MD;  Location: [facility] CATH LAB;  Service: Cardiovascular;  Laterality: N/A;  . Submandibular duct ligation Right 10/2014    R submandibular gland stone removed [facility])    Current Medications:    Current Meds  Medication Sig  . acetaminophen (TYLENOL) 500 MG tablet Take 1 tablet (500 mg total) by mouth every 6 (six) hours as needed. (Patient taking differently: Take 1,000  mg by mouth at bedtime as needed for moderate pain. )  . aspirin EC 81 MG EC tablet Take 1 tablet (81 mg total) by mouth daily.  . citalopram (CELEXA) 20 MG tablet Take 1 tablet (20 mg total) by mouth daily. (Patient taking differently: Take 20 mg by mouth at bedtime. )  . clopidogrel (PLAVIX) 75 MG tablet Take 1 tablet (75 mg total) by mouth daily. (Patient taking differently: Take 75 mg by mouth at bedtime. )  . gabapentin (NEURONTIN) 100 MG capsule Take 1 capsule (100 mg total) by mouth 2 (two) times daily. (Patient taking differently: Take 200 mg by mouth 2 (two) times daily. )  . Insulin Glargine (LANTUS SOLOSTAR) 100 UNIT/ML Solostar Pen Inject 60  Units into the skin 2 (two) times daily. QS 1 mo  . Insulin Pen Needle (PEN NEEDLES) 31G X 8 MM MISC Use to inject Lantus as directed twice daily. Dx: E11.40  . lisinopril (ZESTRIL) 2.5 MG tablet Take 1 tablet (2.5 mg total) by mouth daily.  . metFORMIN (GLUCOPHAGE) 1000 MG tablet Take 1 tablet (1,000 mg total) by mouth 2 (two) times daily with a meal.  . metoprolol tartrate (LOPRESSOR) 25 MG tablet Take 0.5 tablets (12.5 mg total) by mouth 2 (two) times daily.  . nitroGLYCERIN (NITROSTAT) 0.4 MG SL tablet Place 1 tablet (0.4 mg total) under the tongue every 5 (five) minutes as needed for chest pain.  . pantoprazole (PROTONIX) 40 MG tablet Take 1 tablet (40 mg total) by mouth daily. (Patient taking differently: Take 40 mg by mouth at bedtime. )  . promethazine (PHENERGAN) 25 MG tablet Take 1 tablet by mouth every 8 (eight) hours as needed for nausea.   . simvastatin (ZOCOR) 40 MG tablet TAKE 1 TABLET (40 MG TOTAL) BY MOUTH DAILY. (Patient taking differently: Take 40 mg by mouth daily at 6 PM. )  . traMADol (ULTRAM) 50 MG tablet Take 1-2 tablets (50-100 mg total) by mouth every 12 (twelve) hours as needed for moderate pain.    Allergies:   Clindamycin/lincomycin; Erythromycin; Lipitor; and Penicillins   Social History   Social History  . Marital Status: Widowed    Spouse Name: N/A  . Number of Children: 0  . Years of Education: 12th grade   Occupational History  . ALF, [employer], med tech/PCA    Social History Main Topics  . Smoking status: Never Smoker   . Smokeless tobacco: Never Used  . Alcohol Use: No  . Drug Use: No  . Sexual Activity: Not Asked   Other Topics Concern  . None   Social History Narrative   Caffeine: 6 diet sodas/day   Lives with 2 dogs.   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 There is no history of Kidney disease.   ROS:   Please see the history of present illness.    Review of Systems  Cardiovascular: Positive for dyspnea on exertion and irregular heartbeat.   All other systems reviewed and are negative.   Physical Exam:    VS:  BP  120/58 mmHg  Pulse 64  Ht 5\' 1"  (1.549 m)  Wt 176 lb 9.6 oz (80.105 kg)  BMI 33.39 kg/m2   GEN: Well nourished, well developed, in no acute distress HEENT: normal Neck: no JVD, no masses Cardiac: Normal S1/S2, RRR; no murmurs, rubs, or gallops, no edema;  no carotid bruits,   Respiratory:  clear to auscultation bilaterally; no wheezing, rhonchi or rales GI: soft, nontender, nondistended MS: no deformity or atrophy Skin: warm and dry Neuro: No focal deficits  Psych: Alert and oriented x 3, normal affect  Wt Readings from Last 3 Encounters:  09/15/15 176 lb 9.6 oz (80.105 kg)  07/28/15 174 lb (78.926 kg)  07/08/15 174 lb (78.926 kg)      Studies/Labs Reviewed:     EKG:  EKG is  ordered today.  The ekg ordered today demonstrates NSR, HR 64, normal axis, NSSTTW changes, QTc 418 ms, no changes  Recent Labs: 02/01/2015: Platelets 190 07/02/2015: ALT 21; TSH 0.84 09/12/2015: BUN 17; Creatinine, Ser 0.60; Hemoglobin 15.6*; Potassium 4.0; Sodium 136   Recent Lipid Panel    Component Value Date/Time   CHOL 294* 07/02/2015 1351   TRIG * 07/02/2015 1351    837.0 Triglyceride is over 400; calculations on Lipids are invalid.   HDL 40.00 07/02/2015 1351   CHOLHDL 7 07/02/2015 1351   VLDL 159.8* 02/21/2014 0905   LDLCALC UNABLE TO CALCULATE IF TRIGLYCERIDE OVER 400 mg/dL 07/24/2013 0020   LDLDIRECT 114.0 07/02/2015  1351    Additional studies/ records that were reviewed today include:   Echo 07/24/13 Mild LVH, EF 55-60%, normal wall motion, normal diastolic function  LHC [id number] LM patent LAD patent RI ostial or proximal 95% LCx without significant obstruction, OM1 ostial 30-40% RCA mid occluded EF 55% PCI: POBA to mid RCA with residual stenosis 40%  Staged PCI 3/15 Promus Premier DES to the RI   ASSESSMENT:     1. Paroxysmal SVT (supraventricular tachycardia) ([place])   2. Coronary artery disease involving native coronary artery of native heart without angina pectoris   3. Essential hypertension   4. Hyperlipidemia   5. Chest pain, unspecified chest pain type     PLAN:     In order of problems listed above:  1. PSVT - Recent trip to the ED with reported SVT that converted with Adenosine. Rhythm strips reviewed and appear to demonstrate SVT.  CEs were neg.  Recent TSH in 2/17 normal. She has been intol of higher doses of beta-blocker in the past.  D/w Dr. Allegra Lai today.   -  Refer to EP to discuss +/- RFCA.  2. CAD - s/p Inf STEMI in 2015 tx with POBA to RCA and staged PCI to RI with DES.  Recent hx of chest pain.  Somewhat atypical.  DM2 uncontrolled with A1c > 12. She is at risk of progressive CAD.  Continue ASA, Plavix, statin, beta-blocker.    -  Lexiscan Myoview (she does not think she can walk on a treadmill).   3. HTN - Controlled.   4. HL - Trigs very high in 2/17.  Managed by PCP.    5. Chest pain - Atypical.  She has some symptoms that are separate from what she experiences with SVT.  Will get nuclear stress test as noted.    Medication Adjustments/Labs and Tests Ordered: Current medicines are reviewed at length with the patient today.  Concerns regarding medicines are outlined above.  Medication changes, Labs and Tests ordered today are outlined  in the Patient Instructions noted below. Patient Instructions  Medication Instructions:  Your physician recommends that you  continue on your current medications as directed. Please refer to the Current Medication list given to you today. Labwork: NONE Testing/Procedures: Your physician has requested that you have a lexiscan myoview. For further information please visit [facility][email or web address]. Please follow instruction sheet, as given. Follow-Up: 1. YOU ARE BEING REFERRED TO EP; PLEASE SEE ONE OF OUR EP DR IN ABOUT 1 MONTH 2. DR. Tamala Julian IN 3 MONTHS Any Other Special Instructions Will Be Listed Below (If Applicable). If you need a refill on your cardiac medications before your next appointment, please call your pharmacy.    Signed, Richardson Dopp, PA-C  09/15/2015 5:11 PM    [facility] [address], [place], [place]  [address] Phone: [phone]; Fax: [phone]

## 2015-09-15 NOTE — Patient Instructions (Addendum)
Medication Instructions:  Your physician recommends that you continue on your current medications as directed. Please refer to the Current Medication list given to you today. Labwork: NONE Testing/Procedures: Your physician has requested that you have a lexiscan myoview. For further information please visit [email or web address]. Please follow instruction sheet, as given. Follow-Up: 1. YOU ARE BEING REFERRED TO EP; PLEASE SEE ONE OF OUR EP DR IN ABOUT 1 MONTH 2. DR. Tamala Julian IN 3 MONTHS Any Other Special Instructions Will Be Listed Below (If Applicable). If you need a refill on your cardiac medications before your next appointment, please call your pharmacy.

## 2015-09-16 ENCOUNTER — Telehealth (HOSPITAL_COMMUNITY): Payer: Self-pay | Admitting: *Deleted

## 2015-09-16 NOTE — Telephone Encounter (Signed)
Attempted to call patient regarding upcoming appointment- no answer. Lorrane Mccay J Noam Karaffa, RN 

## 2015-09-17 ENCOUNTER — Ambulatory Visit: Payer: BLUE CROSS/BLUE SHIELD | Admitting: Cardiovascular Disease

## 2015-09-21 ENCOUNTER — Encounter: Payer: Self-pay | Admitting: Physician Assistant

## 2015-09-21 ENCOUNTER — Ambulatory Visit (HOSPITAL_COMMUNITY): Payer: BLUE CROSS/BLUE SHIELD | Attending: Internal Medicine

## 2015-09-21 DIAGNOSIS — R0609 Other forms of dyspnea: Secondary | ICD-10-CM | POA: Diagnosis not present

## 2015-09-21 DIAGNOSIS — R0602 Shortness of breath: Secondary | ICD-10-CM | POA: Diagnosis not present

## 2015-09-21 DIAGNOSIS — E109 Type 1 diabetes mellitus without complications: Secondary | ICD-10-CM | POA: Diagnosis not present

## 2015-09-21 DIAGNOSIS — R079 Chest pain, unspecified: Secondary | ICD-10-CM | POA: Diagnosis not present

## 2015-09-21 LAB — MYOCARDIAL PERFUSION IMAGING
CHL CUP NUCLEAR SDS: 1
CHL CUP NUCLEAR SRS: 1
CSEPPHR: 86 {beats}/min
LHR: 0.3
LV dias vol: 70 mL (ref 46–106)
LV sys vol: 20 mL
Rest HR: 66 {beats}/min
SSS: 2
TID: 1.13

## 2015-09-21 MED ORDER — TECHNETIUM TC 99M SESTAMIBI GENERIC - CARDIOLITE
32.7000 | Freq: Once | INTRAVENOUS | Status: AC | PRN
  Administered 2015-09-21: 32.7 via INTRAVENOUS

## 2015-09-21 MED ORDER — TECHNETIUM TC 99M SESTAMIBI GENERIC - CARDIOLITE
10.8000 | Freq: Once | INTRAVENOUS | Status: AC | PRN
  Administered 2015-09-21: 11 via INTRAVENOUS

## 2015-09-21 MED ORDER — REGADENOSON 0.4 MG/5ML IV SOLN
0.4000 mg | Freq: Once | INTRAVENOUS | Status: AC
  Administered 2015-09-21: 0.4 mg via INTRAVENOUS

## 2015-09-22 ENCOUNTER — Telehealth: Payer: Self-pay | Admitting: *Deleted

## 2015-09-22 NOTE — Telephone Encounter (Signed)
VM not set up yet. I will try to call pt later today.

## 2015-09-22 NOTE — Telephone Encounter (Signed)
F/u  Pt returningphone call. Please call back and discuss.   

## 2015-09-22 NOTE — Telephone Encounter (Signed)
Pt notified of normal myoview by phone with verbal understanding 

## 2015-09-28 ENCOUNTER — Ambulatory Visit: Payer: Self-pay | Admitting: Family Medicine

## 2015-09-29 ENCOUNTER — Ambulatory Visit: Payer: BLUE CROSS/BLUE SHIELD | Admitting: Family Medicine

## 2015-10-07 ENCOUNTER — Other Ambulatory Visit: Payer: Self-pay | Admitting: *Deleted

## 2015-10-07 MED ORDER — PANTOPRAZOLE SODIUM 40 MG PO TBEC: 40.0000 mg | DELAYED_RELEASE_TABLET | Freq: Every day | ORAL | Status: DC

## 2015-10-08 ENCOUNTER — Encounter: Payer: Self-pay | Admitting: Family Medicine

## 2015-10-09 MED ORDER — POTASSIUM CHLORIDE CRYS ER 10 MEQ PO TBCR: 10.0000 meq | EXTENDED_RELEASE_TABLET | Freq: Every day | ORAL | Status: DC | PRN

## 2015-10-09 MED ORDER — FUROSEMIDE 20 MG PO TABS: 20.0000 mg | ORAL_TABLET | Freq: Every day | ORAL | Status: DC | PRN

## 2015-10-13 ENCOUNTER — Ambulatory Visit: Payer: Self-pay | Admitting: Family Medicine

## 2015-10-13 ENCOUNTER — Encounter: Payer: Self-pay | Admitting: *Deleted

## 2015-10-15 DIAGNOSIS — I471 Supraventricular tachycardia, unspecified: Secondary | ICD-10-CM

## 2015-10-15 HISTORY — DX: Supraventricular tachycardia: I47.1

## 2015-10-15 HISTORY — DX: Supraventricular tachycardia, unspecified: I47.10

## 2015-10-16 ENCOUNTER — Ambulatory Visit (INDEPENDENT_AMBULATORY_CARE_PROVIDER_SITE_OTHER): Payer: BLUE CROSS/BLUE SHIELD | Admitting: Internal Medicine

## 2015-10-16 ENCOUNTER — Encounter: Payer: Self-pay | Admitting: Internal Medicine

## 2015-10-16 VITALS — BP 160/80 | HR 66 | Ht 61.0 in | Wt 183.0 lb

## 2015-10-16 DIAGNOSIS — I471 Supraventricular tachycardia: Secondary | ICD-10-CM | POA: Diagnosis not present

## 2015-10-16 NOTE — Patient Instructions (Signed)
Medication Instructions:  Your physician recommends that you continue on your current medications as directed. Please refer to the Current Medication list given to you today.   Labwork: Your physician recommends that you return for lab at the hospital   Testing/Procedures: Your physician has recommended that you have an ablation. Catheter ablation is a medical procedure used to treat some cardiac arrhythmias (irregular heartbeats). During catheter ablation, a long, thin, flexible tube is put into a blood vessel in your groin (upper thigh), or neck. This tube is called an ablation catheter. It is then guided to your heart through the blood vessel. Radio frequency waves destroy small areas of heart tissue where abnormal heartbeats may cause an arrhythmia to start. Please see the instruction sheet given to you today.---11/06/15  Please arrive at the [facility] at 7:30am on 11/06/15 Nothing to eat or drink after midnight the night before your procedure Plan for one night stay at the hospital Hold Metoprolol for 48 hours prior to the procedure    Follow-Up:  Your physician recommends that you schedule a follow-up appointment in: 4 weeks from 11/06/15 with Dr Lovena Le   Any Other Special Instructions Will Be Listed Below (If Applicable).     If you need a refill on your cardiac medications before your next appointment, please call your pharmacy.

## 2015-10-16 NOTE — Progress Notes (Signed)
HPI Ms. Manzer is referred today by Richardson Dopp for evaluation of SVT. She is a pleasant 60yo woman with a h/o CAD, s/p MI, HTN, who has experienced palpitations for several years, requiring IV adenosine for termination. Her symptoms have increased in frequency and severity and require IV Adenosine for termination. She has experienced sob and chest pressure with the episodies which can last for up to an hour. Allergies  Allergen Reactions  . Clindamycin/Lincomycin Nausea And Vomiting    Pt refuses to retry  . Erythromycin Other (See Comments)    Stomach cramps  . Lipitor [Atorvastatin] Nausea Only  . Penicillins Rash    Has patient had a PCN reaction causing immediate rash, facial/tongue/throat swelling, SOB or lightheadedness with hypotension: Yes Has patient had a PCN reaction causing severe rash involving mucus membranes or skin necrosis: No Has patient had a PCN reaction that required hospitalization No Has patient had a PCN reaction occurring within the last 10 years: No If all of the above answers are "NO", then may proceed with Cephalosporin use.       Current Outpatient Prescriptions  Medication Sig Dispense Refill  . acetaminophen (TYLENOL) 500 MG tablet Take 500 mg by mouth every 6 (six) hours as needed (pain).    Marland Kitchen aspirin EC 81 MG EC tablet Take 1 tablet (81 mg total) by mouth daily.    . citalopram (CELEXA) 20 MG tablet Take 1 tablet (20 mg total) by mouth daily. 30 tablet 6  . clopidogrel (PLAVIX) 75 MG tablet Take 1 tablet (75 mg total) by mouth daily. 30 tablet 6  . furosemide (LASIX) 20 MG tablet Take 1 tablet (20 mg total) by mouth daily as needed for edema. 30 tablet 0  . gabapentin (NEURONTIN) 100 MG capsule Take 1 capsule (100 mg total) by mouth 2 (two) times daily. 180 capsule 3  . Insulin Glargine (LANTUS SOLOSTAR) 100 UNIT/ML Solostar Pen Inject 60 Units into the skin 2 (two) times daily. QS 1 mo 12 pen 11  . Insulin Pen Needle (PEN NEEDLES) 31G X 8 MM  MISC Use to inject Lantus as directed twice daily. Dx: E11.40 60 each 3  . lisinopril (ZESTRIL) 2.5 MG tablet Take 1 tablet (2.5 mg total) by mouth daily. 90 tablet 1  . metFORMIN (GLUCOPHAGE) 1000 MG tablet Take 1 tablet (1,000 mg total) by mouth 2 (two) times daily with a meal. 180 tablet 3  . metoprolol tartrate (LOPRESSOR) 25 MG tablet Take 0.5 tablets (12.5 mg total) by mouth 2 (two) times daily. 30 tablet 6  . nitroGLYCERIN (NITROSTAT) 0.4 MG SL tablet Place 1 tablet (0.4 mg total) under the tongue every 5 (five) minutes as needed for chest pain. 25 tablet 2  . pantoprazole (PROTONIX) 40 MG tablet Take 1 tablet (40 mg total) by mouth daily. 30 tablet 3  . potassium chloride (K-DUR,KLOR-CON) 10 MEQ tablet Take 1 tablet (10 mEq total) by mouth daily as needed (with lasix). 30 tablet 0  . promethazine (PHENERGAN) 25 MG tablet Take 1 tablet by mouth every 8 (eight) hours as needed for nausea.   0  . simvastatin (ZOCOR) 40 MG tablet TAKE 1 TABLET (40 MG TOTAL) BY MOUTH DAILY. (Patient taking differently: Take 40 mg by mouth daily. ) 30 tablet 3  . traMADol (ULTRAM) 50 MG tablet Take 1-2 tablets (50-100 mg total) by mouth every 12 (twelve) hours as needed for moderate pain. 30 tablet 0   No current facility-administered medications for this  visit.     Past Medical History  Diagnosis Date  . History of chicken pox   . Depression   . Type 2 diabetes, uncontrolled, with neuropathy ([place])     dx 1993  . Chronic bronchitis   . Syncopal episodes   . GERD (gastroesophageal reflux disease)   . Seasonal allergies   . High blood pressure   . HLD (hyperlipidemia)   . History of ovarian cyst   . Tubal pregnancy   . Tachyarrhythmia     ER visits with adenosine push to control  . CAD (coronary artery disease)     STEMI 07/2013 - treated with PTCA of the RCA followed by DES to the intermediate ramus  //  Myoview 5/17 - Normal perfusion. LVEF 71% with normal wall motion. This is a low risk study.  .  Seizure ([place]) 1977    off AEDs    ROS:   All systems reviewed and negative except as noted in the HPI.   Past Surgical History  Procedure Laterality Date  . Tonsillectomy and adenoidectomy  1965  . Ovarian cyst surgery  1998  . Dilation and curettage of uterus  1984  . Ectopic pregnancy surgery Right 1984    tube removal 2/2 hemorrhage  . Rotator cuff repair  2006    Right  . Cardiac catheterization  07/2013    angioplasty to RCA and DES to ramus intermedius  . Left heart catheterization with coronary angiogram N/A 07/23/2013    Procedure: LEFT HEART CATHETERIZATION WITH CORONARY ANGIOGRAM;  Surgeon: Sinclair Grooms, MD;  Location: [facility] CATH LAB;  Service: Cardiovascular;  Laterality: N/A;  . Percutaneous coronary stent intervention (pci-s) N/A 07/24/2013    Procedure: PERCUTANEOUS CORONARY STENT INTERVENTION (PCI-S);  Surgeon: Sinclair Grooms, MD;  Location: [facility] CATH LAB;  Service: Cardiovascular;  Laterality: N/A;  . Submandibular duct ligation Right 10/2014    R submandibular gland stone removed Erik Obey)     Family History  Problem Relation Age of Onset  . Diabetes Mother   . Hyperlipidemia Mother   . Hypertension Mother   . Stroke Mother   . Coronary artery disease Mother     CABG  . Heart disease Mother   . Diabetes Father   . Hypertension Father   . Hyperlipidemia Father   . Coronary artery disease Father     CABG  . Congestive Heart Failure Father   . Pancreatic cancer Father   . Arrhythmia Brother   . Diabetes Maternal Grandmother   . Diabetes Maternal Grandfather   . Diabetes Paternal Grandmother   . Diabetes Paternal Grandfather   . Kidney disease Neg Hx   . Heart disease Brother      Social History   Social History  . Marital Status: Widowed    Spouse Name: N/A  . Number of Children: 0  . Years of Education: 12th grade   Occupational History  . ALF, [place], med tech/PCA    Social History Main Topics  . Smoking status: Never Smoker     . Smokeless tobacco: Never Used  . Alcohol Use: No  . Drug Use: No  . Sexual Activity: Not on file   Other Topics Concern  . Not on file   Social History Narrative   Caffeine: 6 diet sodas/day   Lives with 2 dogs.   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   Activity: no regular exercise   Diet: some water, fruits/vegetables daily  BP 160/80 mmHg  Pulse 66  Ht 5\' 1"  (1.549 m)  Wt 183 lb (83.008 kg)  BMI 34.60 kg/m2  Physical Exam:  Well appearing middle aged woman, NAD HEENT: Unremarkable Neck:  6 cm JVD, no thyromegally Lymphatics:  No adenopathy Back:  No CVA tenderness Lungs:  Clear with no wheezes HEART:  Regular rate rhythm, no murmurs, no rubs, no clicks Abd:  soft, positive bowel sounds, no organomegally, no rebound, no guarding Ext:  2 plus pulses, no edema, no cyanosis, no clubbing Skin:  No rashes no nodules Neuro:  CN II through XII intact, motor grossly intact  EKG - nsr with no pre-excitation  DEVICE  Normal device function.  See PaceArt for details.   Assess/Plan: 1. SVT - I have discussed the treatment options with the patient. The risks/benefits/goals/expectations of catheter ablation and she will call us to proceed with the ablation 2. CAD - she denies anginal symptoms. I expect she will continue on her beta blocker after ablation due to her CAD. 3. HTN - her blood pressure is elevated. Would consider switching her metoprolol to coreg.  Mikle Bosworth.D.

## 2015-10-23 ENCOUNTER — Encounter: Payer: Self-pay | Admitting: Family Medicine

## 2015-11-04 ENCOUNTER — Telehealth: Payer: Self-pay | Admitting: Family Medicine

## 2015-11-04 NOTE — Telephone Encounter (Signed)
[facility] Patient Name: JAYDAH CUTLER DOB: January 01, 1956 Initial Comment Caller states Has had diarreha all day long. Can anything be called in. Nurse Assessment Nurse: Malva Cogan, RN, Juliann Pulse Date/Time Eilene Ghazi Time): 11/04/2015 5:09:04 PM Confirm and document reason for call. If symptomatic, describe symptoms. You must click the next button to save text entered. ---Caller states that she had onset of diarrhea without vomiting or fever. Has had 7-8 episodes of diarrhea thus far. Has taken 6 doses of Imodium thus far which caller states is maximum amount that is allowed each day & was wanting to know if there is any prescription meds that could be called in for her. Has the patient traveled out of the country within the last 30 days? ---No Does the patient have any new or worsening symptoms? ---Yes Will a triage be completed? ---Yes Related visit to physician within the last 2 weeks? ---No Does the PT have any chronic conditions? (i.e. diabetes, asthma, etc.) ---Yes List chronic conditions. ---Type 2 DM, HTN, acid reflux, tachycardia, neuropathy, high cholesterol, H/O cardiac stent placed in 2015 after heart attack, depression Is this a behavioral health or substance abuse call? ---No Guidelines Guideline Title Affirmed Question Affirmed Notes Diarrhea SEVERE diarrhea (e.g., 7 or more times / day more than normal) Final Disposition User [place], RN, Juliann Pulse Comments Lost contact with caller. Called caller back after call was dropped & phone appeared to be picked up but then disconnected. Caller did advise triager when triager was speaking with her that she had a new phone & she may have accidentally hit a wrong button. Caller advised that she can take total of 8 doses of Imodium per day. Disagree/Comply: Comply

## 2015-11-05 NOTE — Telephone Encounter (Signed)
Unable to reach pt by phone.

## 2015-11-06 ENCOUNTER — Encounter (HOSPITAL_COMMUNITY)
Admission: RE | Disposition: A | Discharge status: Home / Self Care | Payer: Self-pay | Source: Ambulatory Visit | Attending: Internal Medicine

## 2015-11-06 ENCOUNTER — Encounter (HOSPITAL_COMMUNITY): Payer: Self-pay | Admitting: Internal Medicine

## 2015-11-06 ENCOUNTER — Ambulatory Visit (HOSPITAL_COMMUNITY)
Admission: RE | Admit: 2015-11-06 | Discharge: 2015-11-07 | Disposition: A | Discharge status: Home / Self Care | Payer: BLUE CROSS/BLUE SHIELD | Source: Ambulatory Visit | Attending: Internal Medicine | Admitting: Internal Medicine

## 2015-11-06 DIAGNOSIS — Z79899 Other long term (current) drug therapy: Secondary | ICD-10-CM | POA: Diagnosis not present

## 2015-11-06 DIAGNOSIS — Z7901 Long term (current) use of anticoagulants: Secondary | ICD-10-CM | POA: Diagnosis not present

## 2015-11-06 DIAGNOSIS — E1140 Type 2 diabetes mellitus with diabetic neuropathy, unspecified: Secondary | ICD-10-CM | POA: Diagnosis not present

## 2015-11-06 DIAGNOSIS — I471 Supraventricular tachycardia: Secondary | ICD-10-CM | POA: Insufficient documentation

## 2015-11-06 DIAGNOSIS — I2510 Atherosclerotic heart disease of native coronary artery without angina pectoris: Secondary | ICD-10-CM | POA: Diagnosis not present

## 2015-11-06 DIAGNOSIS — Z8249 Family history of ischemic heart disease and other diseases of the circulatory system: Secondary | ICD-10-CM | POA: Insufficient documentation

## 2015-11-06 DIAGNOSIS — Z955 Presence of coronary angioplasty implant and graft: Secondary | ICD-10-CM | POA: Insufficient documentation

## 2015-11-06 DIAGNOSIS — Z7982 Long term (current) use of aspirin: Secondary | ICD-10-CM | POA: Diagnosis not present

## 2015-11-06 DIAGNOSIS — I252 Old myocardial infarction: Secondary | ICD-10-CM | POA: Insufficient documentation

## 2015-11-06 DIAGNOSIS — I10 Essential (primary) hypertension: Secondary | ICD-10-CM | POA: Diagnosis not present

## 2015-11-06 DIAGNOSIS — Z7902 Long term (current) use of antithrombotics/antiplatelets: Secondary | ICD-10-CM | POA: Diagnosis not present

## 2015-11-06 DIAGNOSIS — Z794 Long term (current) use of insulin: Secondary | ICD-10-CM | POA: Diagnosis not present

## 2015-11-06 DIAGNOSIS — Z880 Allergy status to penicillin: Secondary | ICD-10-CM | POA: Diagnosis not present

## 2015-11-06 DIAGNOSIS — E785 Hyperlipidemia, unspecified: Secondary | ICD-10-CM | POA: Diagnosis not present

## 2015-11-06 HISTORY — PX: ABLATION OF DYSRHYTHMIC FOCUS: SHX254

## 2015-11-06 HISTORY — PX: ELECTROPHYSIOLOGIC STUDY: SHX172A

## 2015-11-06 HISTORY — DX: Supraventricular tachycardia: I47.1

## 2015-11-06 LAB — GLUCOSE, CAPILLARY
GLUCOSE-CAPILLARY: 195 mg/dL — AB (ref 65–99)
GLUCOSE-CAPILLARY: 164 mg/dL — AB (ref 65–99)
GLUCOSE-CAPILLARY: 237 mg/dL — AB (ref 65–99)
Glucose-Capillary: 151 mg/dL — ABNORMAL HIGH (ref 65–99)
Glucose-Capillary: 167 mg/dL — ABNORMAL HIGH (ref 65–99)

## 2015-11-06 LAB — CBC
HEMATOCRIT: 39.1 % (ref 36.0–46.0)
HEMOGLOBIN: 13 g/dL (ref 12.0–15.0)
MCH: 27.5 pg (ref 26.0–34.0)
MCHC: 33.2 g/dL (ref 30.0–36.0)
MCV: 82.7 fL (ref 78.0–100.0)
Platelets: 137 10*3/uL — ABNORMAL LOW (ref 150–400)
RBC: 4.73 MIL/uL (ref 3.87–5.11)
RDW: 13.7 % (ref 11.5–15.5)
WBC: 4.1 10*3/uL (ref 4.0–10.5)

## 2015-11-06 LAB — BASIC METABOLIC PANEL
ANION GAP: 7 (ref 5–15)
BUN: 9 mg/dL (ref 6–20)
CHLORIDE: 106 mmol/L (ref 101–111)
CO2: 25 mmol/L (ref 22–32)
CREATININE: 0.66 mg/dL (ref 0.44–1.00)
Calcium: 8.8 mg/dL — ABNORMAL LOW (ref 8.9–10.3)
GFR calc non Af Amer: >60 mL/min (ref >60)
Glucose, Bld: 185 mg/dL — ABNORMAL HIGH (ref 65–99)
POTASSIUM: 3.8 mmol/L (ref 3.5–5.1)
Sodium: 138 mmol/L (ref 135–145)

## 2015-11-06 SURGERY — A-FLUTTER/A-TACH/SVT ABLATION

## 2015-11-06 MED ORDER — ACETAMINOPHEN 325 MG PO TABS: 650.0000 mg | ORAL_TABLET | ORAL | Status: DC | PRN

## 2015-11-06 MED ORDER — BUPIVACAINE HCL (PF) 0.25 % IJ SOLN
INTRAMUSCULAR | Status: AC
  Filled 2015-11-06: qty 60

## 2015-11-06 MED ORDER — MIDAZOLAM HCL 5 MG/5ML IJ SOLN
INTRAMUSCULAR | Status: DC | PRN
  Administered 2015-11-06 (×5): 1 mg via INTRAVENOUS

## 2015-11-06 MED ORDER — ACETAMINOPHEN 500 MG PO TABS: 1000.0000 mg | ORAL_TABLET | Freq: Four times a day (QID) | ORAL | Status: DC | PRN

## 2015-11-06 MED ORDER — MIDAZOLAM HCL 5 MG/5ML IJ SOLN
INTRAMUSCULAR | Status: AC
  Filled 2015-11-06: qty 5

## 2015-11-06 MED ORDER — LISINOPRIL 2.5 MG PO TABS
2.5000 mg | ORAL_TABLET | Freq: Every day | ORAL | Status: DC
  Administered 2015-11-06 – 2015-11-07 (×2): 2.5 mg via ORAL
  Filled 2015-11-06 (×2): qty 1

## 2015-11-06 MED ORDER — ISOPROTERENOL HCL 0.2 MG/ML IJ SOLN
INTRAMUSCULAR | Status: AC
  Filled 2015-11-06: qty 5

## 2015-11-06 MED ORDER — SODIUM CHLORIDE 0.9% FLUSH: 3.0000 mL | INTRAVENOUS | Status: DC | PRN

## 2015-11-06 MED ORDER — METFORMIN HCL 500 MG PO TABS
1000.0000 mg | ORAL_TABLET | Freq: Two times a day (BID) | ORAL | Status: DC
  Administered 2015-11-06 – 2015-11-07 (×2): 1000 mg via ORAL
  Filled 2015-11-06 (×2): qty 2

## 2015-11-06 MED ORDER — BUPIVACAINE HCL (PF) 0.25 % IJ SOLN
INTRAMUSCULAR | Status: DC | PRN
  Administered 2015-11-06: 60 mL

## 2015-11-06 MED ORDER — INSULIN GLARGINE 100 UNIT/ML ~~LOC~~ SOLN
60.0000 [IU] | Freq: Two times a day (BID) | SUBCUTANEOUS | Status: DC
  Administered 2015-11-06 – 2015-11-07 (×3): 60 [IU] via SUBCUTANEOUS
  Filled 2015-11-06 (×4): qty 0.6

## 2015-11-06 MED ORDER — CLOPIDOGREL BISULFATE 75 MG PO TABS
75.0000 mg | ORAL_TABLET | Freq: Every day | ORAL | Status: DC
  Administered 2015-11-07: 75 mg via ORAL
  Filled 2015-11-06: qty 1

## 2015-11-06 MED ORDER — CITALOPRAM HYDROBROMIDE 20 MG PO TABS
20.0000 mg | ORAL_TABLET | Freq: Every day | ORAL | Status: DC
  Administered 2015-11-06 – 2015-11-07 (×2): 20 mg via ORAL
  Filled 2015-11-06 (×2): qty 1

## 2015-11-06 MED ORDER — PROMETHAZINE HCL 25 MG PO TABS: 25.0000 mg | ORAL_TABLET | Freq: Three times a day (TID) | ORAL | Status: DC | PRN

## 2015-11-06 MED ORDER — POTASSIUM CHLORIDE CRYS ER 10 MEQ PO TBCR: 10.0000 meq | EXTENDED_RELEASE_TABLET | Freq: Every day | ORAL | Status: DC | PRN

## 2015-11-06 MED ORDER — NITROGLYCERIN 0.4 MG SL SUBL: 0.4000 mg | SUBLINGUAL_TABLET | SUBLINGUAL | Status: DC | PRN

## 2015-11-06 MED ORDER — HEPARIN (PORCINE) IN NACL 2-0.9 UNIT/ML-% IJ SOLN
INTRAMUSCULAR | Status: DC | PRN
  Administered 2015-11-06: 08:00:00

## 2015-11-06 MED ORDER — METOPROLOL TARTRATE 12.5 MG HALF TABLET
12.5000 mg | ORAL_TABLET | Freq: Two times a day (BID) | ORAL | Status: DC
  Filled 2015-11-06 (×3): qty 1

## 2015-11-06 MED ORDER — FENTANYL CITRATE (PF) 100 MCG/2ML IJ SOLN
INTRAMUSCULAR | Status: AC
  Filled 2015-11-06: qty 2

## 2015-11-06 MED ORDER — HEPARIN (PORCINE) IN NACL 2-0.9 UNIT/ML-% IJ SOLN
INTRAMUSCULAR | Status: AC
  Filled 2015-11-06: qty 500

## 2015-11-06 MED ORDER — SODIUM CHLORIDE 0.9 % IV SOLN
INTRAVENOUS | Status: DC
  Administered 2015-11-06: 08:00:00 via INTRAVENOUS

## 2015-11-06 MED ORDER — SODIUM CHLORIDE 0.9 % IV SOLN: 250.0000 mL | INTRAVENOUS | Status: DC | PRN

## 2015-11-06 MED ORDER — FENTANYL CITRATE (PF) 100 MCG/2ML IJ SOLN
INTRAMUSCULAR | Status: DC | PRN
  Administered 2015-11-06: 25 ug via INTRAVENOUS
  Administered 2015-11-06 (×4): 12.5 ug via INTRAVENOUS

## 2015-11-06 MED ORDER — ONDANSETRON HCL 4 MG/2ML IJ SOLN: 4.0000 mg | Freq: Four times a day (QID) | INTRAMUSCULAR | Status: DC | PRN

## 2015-11-06 MED ORDER — ASPIRIN EC 81 MG PO TBEC
81.0000 mg | DELAYED_RELEASE_TABLET | Freq: Every day | ORAL | Status: DC
  Administered 2015-11-07: 81 mg via ORAL
  Filled 2015-11-06: qty 1

## 2015-11-06 MED ORDER — PANTOPRAZOLE SODIUM 40 MG PO TBEC
40.0000 mg | DELAYED_RELEASE_TABLET | Freq: Every day | ORAL | Status: DC
  Administered 2015-11-06 – 2015-11-07 (×2): 40 mg via ORAL
  Filled 2015-11-06 (×3): qty 1

## 2015-11-06 MED ORDER — SODIUM CHLORIDE 0.9% FLUSH
3.0000 mL | Freq: Two times a day (BID) | INTRAVENOUS | Status: DC
  Administered 2015-11-06 (×2): 3 mL via INTRAVENOUS

## 2015-11-06 MED ORDER — TRAMADOL HCL 50 MG PO TABS: 50.0000 mg | ORAL_TABLET | Freq: Two times a day (BID) | ORAL | Status: DC | PRN

## 2015-11-06 MED ORDER — SODIUM CHLORIDE 0.9 % IV SOLN
1.0000 mg | INTRAVENOUS | Status: DC | PRN
  Administered 2015-11-06: 4 ug/min via INTRAVENOUS

## 2015-11-06 MED ORDER — FUROSEMIDE 20 MG PO TABS: 20.0000 mg | ORAL_TABLET | Freq: Every day | ORAL | Status: DC | PRN

## 2015-11-06 SURGICAL SUPPLY — 11 items
BAG SNAP BAND KOVER 36X36 (MISCELLANEOUS) ×3 IMPLANT
CATH HEX JOSEPH 2-5-2 65CM 6F (CATHETERS) ×3 IMPLANT
CATH JOSEPHSON QUAD-ALLRED 6FR (CATHETERS) ×6 IMPLANT
CATH THERMISTOR 7FR 4MM (ABLATOR) ×3 IMPLANT
PACK EP LATEX FREE (CUSTOM PROCEDURE TRAY) ×2
PACK EP LF (CUSTOM PROCEDURE TRAY) ×1 IMPLANT
PAD DEFIB LIFELINK (PAD) ×3 IMPLANT
SHEATH PINNACLE 6F 10CM (SHEATH) ×6 IMPLANT
SHEATH PINNACLE 7F 10CM (SHEATH) ×3 IMPLANT
SHEATH PINNACLE 8F 10CM (SHEATH) ×3 IMPLANT
SHIELD RADPAD SCOOP 12X17 (MISCELLANEOUS) ×3 IMPLANT

## 2015-11-06 NOTE — H&P (View-Only) (Signed)
HPI Karen [name] is referred today by Richardson Dopp for evaluation of SVT. She is a pleasant 60yo woman with a h/o CAD, s/p MI, HTN, who has experienced palpitations for several years, requiring IV adenosine for termination. Her symptoms have increased in frequency and severity and require IV Adenosine for termination. She has experienced sob and chest pressure with the episodies which can last for up to an hour. Allergies  Allergen Reactions  . Clindamycin/Lincomycin Nausea And Vomiting    Pt refuses to retry  . Erythromycin Other (See Comments)    Stomach cramps  . Lipitor [Atorvastatin] Nausea Only  . Penicillins Rash    Has patient had a PCN reaction causing immediate rash, facial/tongue/throat swelling, SOB or lightheadedness with hypotension: Yes Has patient had a PCN reaction causing severe rash involving mucus membranes or skin necrosis: No Has patient had a PCN reaction that required hospitalization No Has patient had a PCN reaction occurring within the last 10 years: No If all of the above answers are "NO", then may proceed with Cephalosporin use.       Current Outpatient Prescriptions  Medication Sig Dispense Refill  . acetaminophen (TYLENOL) 500 MG tablet Take 500 mg by mouth every 6 (six) hours as needed (pain).    Marland Kitchen aspirin EC 81 MG EC tablet Take 1 tablet (81 mg total) by mouth daily.    . citalopram (CELEXA) 20 MG tablet Take 1 tablet (20 mg total) by mouth daily. 30 tablet 6  . clopidogrel (PLAVIX) 75 MG tablet Take 1 tablet (75 mg total) by mouth daily. 30 tablet 6  . furosemide (LASIX) 20 MG tablet Take 1 tablet (20 mg total) by mouth daily as needed for edema. 30 tablet 0  . gabapentin (NEURONTIN) 100 MG capsule Take 1 capsule (100 mg total) by mouth 2 (two) times daily. 180 capsule 3  . Insulin Glargine (LANTUS SOLOSTAR) 100 UNIT/ML Solostar Pen Inject 60 Units into the skin 2 (two) times daily. QS 1 mo 12 pen 11  . Insulin Pen Needle (PEN NEEDLES) 31G X 8 MM  MISC Use to inject Lantus as directed twice daily. Dx: E11.40 60 each 3  . lisinopril (ZESTRIL) 2.5 MG tablet Take 1 tablet (2.5 mg total) by mouth daily. 90 tablet 1  . metFORMIN (GLUCOPHAGE) 1000 MG tablet Take 1 tablet (1,000 mg total) by mouth 2 (two) times daily with a meal. 180 tablet 3  . metoprolol tartrate (LOPRESSOR) 25 MG tablet Take 0.5 tablets (12.5 mg total) by mouth 2 (two) times daily. 30 tablet 6  . nitroGLYCERIN (NITROSTAT) 0.4 MG SL tablet Place 1 tablet (0.4 mg total) under the tongue every 5 (five) minutes as needed for chest pain. 25 tablet 2  . pantoprazole (PROTONIX) 40 MG tablet Take 1 tablet (40 mg total) by mouth daily. 30 tablet 3  . potassium chloride (K-DUR,KLOR-CON) 10 MEQ tablet Take 1 tablet (10 mEq total) by mouth daily as needed (with lasix). 30 tablet 0  . promethazine (PHENERGAN) 25 MG tablet Take 1 tablet by mouth every 8 (eight) hours as needed for nausea.   0  . simvastatin (ZOCOR) 40 MG tablet TAKE 1 TABLET (40 MG TOTAL) BY MOUTH DAILY. (Patient taking differently: Take 40 mg by mouth daily. ) 30 tablet 3  . traMADol (ULTRAM) 50 MG tablet Take 1-2 tablets (50-100 mg total) by mouth every 12 (twelve) hours as needed for moderate pain. 30 tablet 0   No current facility-administered medications for this  visit.     Past Medical History  Diagnosis Date  . History of chicken pox   . Depression   . Type 2 diabetes, uncontrolled, with neuropathy ([place])     dx 1993  . Chronic bronchitis   . Syncopal episodes   . GERD (gastroesophageal reflux disease)   . Seasonal allergies   . High blood pressure   . HLD (hyperlipidemia)   . History of ovarian cyst   . Tubal pregnancy   . Tachyarrhythmia     ER visits with adenosine push to control  . CAD (coronary artery disease)     STEMI 07/2013 - treated with PTCA of the RCA followed by DES to the intermediate ramus  //  Myoview 5/17 - Normal perfusion. LVEF 71% with normal wall motion. This is a low risk study.  .  Seizure ([place]) 1977    off AEDs    ROS:   All systems reviewed and negative except as noted in the HPI.   Past Surgical History  Procedure Laterality Date  . Tonsillectomy and adenoidectomy  1965  . Ovarian cyst surgery  1998  . Dilation and curettage of uterus  1984  . Ectopic pregnancy surgery Right 1984    tube removal 2/2 hemorrhage  . Rotator cuff repair  2006    Right  . Cardiac catheterization  07/2013    angioplasty to RCA and DES to ramus intermedius  . Left heart catheterization with coronary angiogram N/A 07/23/2013    Procedure: LEFT HEART CATHETERIZATION WITH CORONARY ANGIOGRAM;  Surgeon: Sinclair Grooms, MD;  Location: [facility] CATH LAB;  Service: Cardiovascular;  Laterality: N/A;  . Percutaneous coronary stent intervention (pci-s) N/A 07/24/2013    Procedure: PERCUTANEOUS CORONARY STENT INTERVENTION (PCI-S);  Surgeon: Sinclair Grooms, MD;  Location: [facility] CATH LAB;  Service: Cardiovascular;  Laterality: N/A;  . Submandibular duct ligation Right 10/2014    R submandibular gland stone removed Erik Obey)     Family History  Problem Relation Age of Onset  . Diabetes Mother   . Hyperlipidemia Mother   . Hypertension Mother   . Stroke Mother   . Coronary artery disease Mother     CABG  . Heart disease Mother   . Diabetes Father   . Hypertension Father   . Hyperlipidemia Father   . Coronary artery disease Father     CABG  . Congestive Heart Failure Father   . Pancreatic cancer Father   . Arrhythmia Brother   . Diabetes Maternal Grandmother   . Diabetes Maternal Grandfather   . Diabetes Paternal Grandmother   . Diabetes Paternal Grandfather   . Kidney disease Neg Hx   . Heart disease Brother      Social History   Social History  . Marital Status: Widowed    Spouse Name: N/A  . Number of Children: 0  . Years of Education: 12th grade   Occupational History  . ALF, [place], med tech/PCA    Social History Main Topics  . Smoking status: Never Smoker     . Smokeless tobacco: Never Used  . Alcohol Use: No  . Drug Use: No  . Sexual Activity: Not on file   Other Topics Concern  . Not on file   Social History Narrative   Caffeine: 6 diet sodas/day   Lives with 2 dogs.   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   Activity: no regular exercise   Diet: some water, fruits/vegetables daily  BP 160/80 mmHg  Pulse 66  Ht 5\' 1"  ([id number] m)  Wt 183 lb (83.008 kg)  BMI 34.60 kg/m2  Physical Exam:  Well appearing middle aged woman, NAD HEENT: Unremarkable Neck:  6 cm JVD, no thyromegally Lymphatics:  No adenopathy Back:  No CVA tenderness Lungs:  Clear with no wheezes HEART:  Regular rate rhythm, no murmurs, no rubs, no clicks Abd:  soft, positive bowel sounds, no organomegally, no rebound, no guarding Ext:  2 plus pulses, no edema, no cyanosis, no clubbing Skin:  No rashes no nodules Neuro:  CN II through XII intact, motor grossly intact  EKG - nsr with no pre-excitation  DEVICE  Normal device function.  See PaceArt for details.   Assess/Plan: 1. SVT - I have discussed the treatment options with the patient. The risks/benefits/goals/expectations of catheter ablation and she will call us to proceed with the ablation 2. CAD - she denies anginal symptoms. I expect she will continue on her beta blocker after ablation due to her CAD. 3. HTN - her blood pressure is elevated. Would consider switching her metoprolol to coreg.  Mikle Bosworth.D.

## 2015-11-06 NOTE — Progress Notes (Signed)
Site area: rt IJ venous sheath Site Prior to Removal:  Level 0 Pressure Applied For:  10 minutes Manual:   yes Patient Status During Pull:  stable Post Pull Site:  Level  0 Post Pull Instructions Given:  yes Post Pull Pulses Present:   NA  Dressing Applied:  Small tegaderm Bedrest begins @  Comments:   

## 2015-11-06 NOTE — Progress Notes (Signed)
Site area: rt groin 3 venous sheaths Site Prior to Removal:  Level 0 Pressure Applied For:  10 minutes Manual:   yes Patient Status During Pull:  stable Post Pull Site:  Level  0 Post Pull Instructions Given:  yes Post Pull Pulses Present:  yes Dressing Applied:  Small tegaderm Bedrest begins @ 1055 Comments:   IV saline locked

## 2015-11-06 NOTE — Interval H&P Note (Signed)
History and Physical Interval Note:  11/06/2015 7:27 AM  Karen [name]  has presented today for surgery, with the diagnosis of svt  The various methods of treatment have been discussed with the patient and family. After consideration of risks, benefits and other options for treatment, the patient has consented to  Procedure(s): SVT Ablation (N/A) as a surgical intervention .  The patient's history has been reviewed, patient examined, no change in status, stable for surgery.  I have reviewed the patient's chart and labs.  Questions were answered to the patient's satisfaction.     Cristopher Peru

## 2015-11-06 NOTE — Progress Notes (Signed)
I was routinely checking procedure sites, R IJ WNL. Right groin site bleeding, dressing soaked and minimal blood on legs. Pressure held for 10 minutes and hemostasis to site. Cath Lab RN came to see pts site and redressed site. Pedal pulses WNL. Pt denies any pain. Dr. Larae Grooms came on the floor rounding shortly after bleeding stopped. I informed him of event. Plan to continue to closely monitor. I asked Amber if she still wanted pt to have her ASA and Plavix today. Pt didn't take it prior to procedure. She wanted to hold off and resume tomorrow

## 2015-11-07 DIAGNOSIS — I2510 Atherosclerotic heart disease of native coronary artery without angina pectoris: Secondary | ICD-10-CM | POA: Diagnosis not present

## 2015-11-07 DIAGNOSIS — I471 Supraventricular tachycardia: Secondary | ICD-10-CM | POA: Diagnosis not present

## 2015-11-07 DIAGNOSIS — I10 Essential (primary) hypertension: Secondary | ICD-10-CM | POA: Diagnosis not present

## 2015-11-07 DIAGNOSIS — I252 Old myocardial infarction: Secondary | ICD-10-CM | POA: Diagnosis not present

## 2015-11-07 LAB — GLUCOSE, CAPILLARY
GLUCOSE-CAPILLARY: 189 mg/dL — AB (ref 65–99)
Glucose-Capillary: 242 mg/dL — ABNORMAL HIGH (ref 65–99)

## 2015-11-07 NOTE — Discharge Summary (Signed)
Discharge Summary    Patient ID: Karen [name],  MRN: [id number], DOB/AGE: 1955-09-03 60 y.o.  Admit date: 11/06/2015 Discharge date: 11/07/2015  Primary Care Provider: [name] Primary Cardiologist: Dr. Tamala Julian Electrophysiologist: Dr. Lovena Le  Discharge Diagnoses    Active Problems:   Paroxysmal SVT (supraventricular tachycardia) (HCC)   SVT (supraventricular tachycardia) (HCC)   Allergies Allergies  Allergen Reactions  . Clindamycin/Lincomycin Nausea And Vomiting and Other (See Comments)    Pt refuses to retry  . Erythromycin Other (See Comments)    Stomach cramps  . Lipitor [Atorvastatin] Nausea Only  . Penicillins Rash and Other (See Comments)    Has patient had a PCN reaction causing immediate rash, facial/tongue/throat swelling, SOB or lightheadedness with hypotension: Yes Has patient had a PCN reaction causing severe rash involving mucus membranes or skin necrosis: No Has patient had a PCN reaction that required hospitalization No Has patient had a PCN reaction occurring within the last 10 years: No If all of the above answers are "NO", then may proceed with Cephalosporin use.      Diagnostic Studies/Procedures    PROCEDURES:  1. Comprehensive EP study.  2. Coronary sinus pacing and recording.  3. Mapping of supraventricular tachycardia.  4. Radiofrequency ablation of supraventricular tachycardia.  5. Arrhythmia induction with isuprel infused    History of Present Illness     Karen [name] is a 60 y.o. female with a history of symptomatic recurrent short RP SVT who presented for EP study and radiofrequency ablation. The patient has had recurrent symptomatic SVT. She has failed medical therapy with beta blockers. She now presents for EP study and radiofrequency ablation of SVT.   Hospital Course     Patient presented to the Electrophysiology lab on 11/06/14 in [facility]. EP study was performed and she underwent SVT ablation. She tolerated the procedure  well. She left the EP lab in stable condition. She was monitored overnight. She had no post procedural complications. She was monitored on telemetry. No arrhthymias. Cath access site remained stable. She was last seen and examined by Dr. Lovena Le who determined she was stable for discharge home. F/u has been arranged with Dr. Lovena Le on 12/10/15. She also has f/u arranged with Dr. Tamala Julian 12/22/15.  Consultants: none  Discharge Vitals Blood pressure 146/58, pulse 76, temperature 97.5 F (36.4 C), temperature source Oral, resp. rate 19, height 5\' 1"  (1.549 m), weight 179 lb 1.6 oz (81.239 kg), SpO2 98 %.  Filed Weights   11/06/15 0618 11/07/15 0431  Weight: 179 lb (81.194 kg) 179 lb 1.6 oz (81.239 kg)   Physical Exam  Constitutional: She is oriented to person, place, and time and well-developed, well-nourished, and in no distress. No distress.  HENT:  Head: Normocephalic and atraumatic.  Eyes: Conjunctivae are normal. Pupils are equal, round, and reactive to light.  Neck: Normal range of motion. Neck supple. No JVD present.  Cardiovascular: Normal rate and regular rhythm.   Pulmonary/Chest: Effort normal and breath sounds normal. No respiratory distress. She has no wheezes. She has no rales.  Abdominal: Soft. Bowel sounds are normal. She exhibits no distension. There is no tenderness.  Musculoskeletal: Normal range of motion. She exhibits no edema.  Neurological: She is alert and oriented to person, place, and time.  Skin: Skin is warm and dry. She is not diaphoretic.  Psychiatric: Affect normal.    Labs & Radiologic Studies    CBC  Recent Labs  11/06/15 0732  WBC 4.1  HGB 13.0  HCT 39.1  MCV 82.7  PLT 0000000*   Basic Metabolic Panel  Recent Labs  11/06/15 0732  NA 138  K 3.8  CL 106  CO2 25  GLUCOSE 185*  BUN 9  CREATININE 0.66  CALCIUM 8.8*   Liver Function Tests No results for input(s): AST, ALT, ALKPHOS, BILITOT, PROT, ALBUMIN in the last 72 hours. No results for  input(s): LIPASE, AMYLASE in the last 72 hours. Cardiac Enzymes No results for input(s): CKTOTAL, CKMB, CKMBINDEX, TROPONINI in the last 72 hours. BNP Invalid input(s): POCBNP D-Dimer No results for input(s): DDIMER in the last 72 hours. Hemoglobin A1C No results for input(s): HGBA1C in the last 72 hours. Fasting Lipid Panel No results for input(s): CHOL, HDL, LDLCALC, TRIG, CHOLHDL, LDLDIRECT in the last 72 hours. Thyroid Function Tests No results for input(s): TSH, T4TOTAL, T3FREE, THYROIDAB in the last 72 hours.  Invalid input(s): FREET3 _____________  No results found. Disposition   Pt is being discharged home today in good condition.  Follow-up Plans & Appointments    Follow-up Information    Follow up with Cristopher Peru, MD On 12/10/2015.   Specialty:  Cardiology   Why:  at 4:15PM    Contact information:   [address] [place] [address] [phone]      Discharge Instructions    Diet - low sodium heart healthy    Complete by:  As directed      Increase activity slowly    Complete by:  As directed            Discharge Medications   Current Discharge Medication List    CONTINUE these medications which have NOT CHANGED   Details  acetaminophen (TYLENOL) 500 MG tablet Take 1,000 mg by mouth every 6 (six) hours as needed (pain).     aspirin EC 81 MG EC tablet Take 1 tablet (81 mg total) by mouth daily.    citalopram (CELEXA) 20 MG tablet Take 1 tablet (20 mg total) by mouth daily. Qty: 30 tablet, Refills: 6    clopidogrel (PLAVIX) 75 MG tablet Take 1 tablet (75 mg total) by mouth daily. Qty: 30 tablet, Refills: 6    furosemide (LASIX) 20 MG tablet Take 1 tablet (20 mg total) by mouth daily as needed for edema. Qty: 30 tablet, Refills: 0    gabapentin (NEURONTIN) 100 MG capsule Take 1 capsule (100 mg total) by mouth 2 (two) times daily. Qty: 180 capsule, Refills: 3    Insulin Glargine (LANTUS SOLOSTAR) 100 UNIT/ML Solostar Pen  Inject 60 Units into the skin 2 (two) times daily. QS 1 mo Qty: 12 pen, Refills: 11    Insulin Pen Needle (PEN NEEDLES) 31G X 8 MM MISC Use to inject Lantus as directed twice daily. Dx: E11.40 Qty: 60 each, Refills: 3    lisinopril (ZESTRIL) 2.5 MG tablet Take 1 tablet (2.5 mg total) by mouth daily. Qty: 90 tablet, Refills: 1    Loperamide HCl (IMODIUM A-D PO) Take 1-2 tablets by mouth as needed (for stomach).    metFORMIN (GLUCOPHAGE) 1000 MG tablet Take 1 tablet (1,000 mg total) by mouth 2 (two) times daily with a meal. Qty: 180 tablet, Refills: 3    metoprolol tartrate (LOPRESSOR) 25 MG tablet Take 0.5 tablets (12.5 mg total) by mouth 2 (two) times daily. Qty: 30 tablet, Refills: 6    nitroGLYCERIN (NITROSTAT) 0.4 MG SL tablet Place 1 tablet (0.4 mg total) under the tongue every 5 (five) minutes as needed  for chest pain. Qty: 25 tablet, Refills: 2   Associated Diagnoses: Dyslipidemia    pantoprazole (PROTONIX) 40 MG tablet Take 1 tablet (40 mg total) by mouth daily. Qty: 30 tablet, Refills: 3    potassium chloride (K-DUR,KLOR-CON) 10 MEQ tablet Take 1 tablet (10 mEq total) by mouth daily as needed (with lasix). Qty: 30 tablet, Refills: 0    simvastatin (ZOCOR) 40 MG tablet TAKE 1 TABLET (40 MG TOTAL) BY MOUTH DAILY. Qty: 30 tablet, Refills: 3    promethazine (PHENERGAN) 25 MG tablet Take 1 tablet by mouth every 8 (eight) hours as needed for nausea.  Refills: 0    traMADol (ULTRAM) 50 MG tablet Take 1-2 tablets (50-100 mg total) by mouth every 12 (twelve) hours as needed for moderate pain. Qty: 30 tablet, Refills: 0           Outstanding Labs/Studies    Duration of Discharge Encounter   Greater than 30 minutes including physician time.  Signed, Lyda Jester PA-C 11/07/2015, 9:23 AM  EP Attending  Patient seen and examined. Agree with above. She is s/p EP study and catheter ablation of AVNRT and is doing well this a.m. She had atrial fib during her procedure  (after ablation) and during additional atrial pacing, of uncertain clinical significance. [facility] for DC home. Usual followup.  Mikle Bosworth.D.

## 2015-11-07 NOTE — Discharge Instructions (Signed)
Cardiac Ablation  Cardiac ablation is a procedure to stop some heart tissue from causing problems. The heart has many electrical connections. Sometimes these connections cause the heart to beat very fast or irregularly. Removing some of the problem areas can improve heart rhythm or make it normal. Ablation is done for people who:   Have Wolff-Parkinson-White syndrome.  Have other fast heart rhythms (tachycardia).  Have taken medicines for an abnormal heart rhythm (arrhythmia) and the medicines had: 1. No success. 2. Side effects.  May have a type of heartbeat that could cause death. BEFORE THE PROCEDURE   Follow instructions from your doctor about eating and drinking before the procedure.  Take your medicines as told by your doctor. Take them at regular times with water unless told differently by your doctor.  If you are taking diabetes medicine, ask your doctor how to take it. Ask if there are any special instructions you should follow. Your doctor may change how much insulin you take the day of the procedure. PROCEDURE   A special type of X-ray will be used. The X-ray helps your doctor see images of your heart during the procedure.  A small cut (incision) will be made in your neck or groin.  An IV tube will be started before the procedure begins.  You will be given a numbing medicine (anesthetic) or a medicine to help you relax (sedative).  The skin on your neck or groin will be numbed.  A needle will be put into a large vein in your neck or groin.  A thin, flexible tube (catheter) will be put in to reach your heart.  A dye will be put in the tube. The dye will show up on X-rays. It will help your doctor see the area of the heart that needs treatment.  When the heart tissue that is causing problems is found, the tip of the tube will send an electrical current to it. This will stop it from causing problems.  The tube will be taken out.  Pressure will be put on the area where  the tube was. This will keep it from bleeding. A bandage will be placed over the area. AFTER THE PROCEDURE  You will be taken to a recovery area. Your blood pressure, heart rate, and breathing will be watched. The area where the tube was will also be watched for bleeding.  You will need to lie still for 4-6 hours. This keeps the area where the tube was from bleeding.   This information is not intended to replace advice given to you by your health care provider. Make sure you discuss any questions you have with your health care provider.   Document Released: 01/02/2013 Document Revised: 05/23/2014 Document Reviewed: 01/02/2013 Elsevier Interactive Patient Education 2016 [employer].  Diabetes Mellitus and Food It is important for you to manage your blood sugar (glucose) level. Your blood glucose level can be greatly affected by what you eat. Eating healthier foods in the appropriate amounts throughout the day at about the same time each day will help you control your blood glucose level. It can also help slow or prevent worsening of your diabetes mellitus. Healthy eating may even help you improve the level of your blood pressure and reach or maintain a healthy weight.  General recommendations for healthful eating and cooking habits include:  Eating meals and snacks regularly. Avoid going long periods of time without eating to lose weight.  Eating a diet that consists mainly of plant-based foods, such  as fruits, vegetables, nuts, legumes, and whole grains.  Using low-heat cooking methods, such as baking, instead of high-heat cooking methods, such as deep frying. Work with your dietitian to make sure you understand how to use the Nutrition Facts information on food labels. HOW CAN FOOD AFFECT ME? Carbohydrates Carbohydrates affect your blood glucose level more than any other type of food. Your dietitian will help you determine how many carbohydrates to eat at each meal and teach you how to  count carbohydrates. Counting carbohydrates is important to keep your blood glucose at a healthy level, especially if you are using insulin or taking certain medicines for diabetes mellitus. Alcohol Alcohol can cause sudden decreases in blood glucose (hypoglycemia), especially if you use insulin or take certain medicines for diabetes mellitus. Hypoglycemia can be a life-threatening condition. Symptoms of hypoglycemia (sleepiness, dizziness, and disorientation) are similar to symptoms of having too much alcohol.  If your health care provider has given you approval to drink alcohol, do so in moderation and use the following guidelines:  Women should not have more than one drink per day, and men should not have more than two drinks per day. One drink is equal to: 1. 12 oz of beer. 2. 5 oz of wine. 3. 1 oz of hard liquor.  Do not drink on an empty stomach.  Keep yourself hydrated. Have water, diet soda, or unsweetened iced tea.  Regular soda, juice, and other mixers might contain a lot of carbohydrates and should be counted. WHAT FOODS ARE NOT RECOMMENDED? As you make food choices, it is important to remember that all foods are not the same. Some foods have fewer nutrients per serving than other foods, even though they might have the same number of calories or carbohydrates. It is difficult to get your body what it needs when you eat foods with fewer nutrients. Examples of foods that you should avoid that are high in calories and carbohydrates but low in nutrients include:  Trans fats (most processed foods list trans fats on the Nutrition Facts label).  Regular soda.  Juice.  Candy.  Sweets, such as cake, pie, doughnuts, and cookies.  Fried foods. WHAT FOODS CAN I EAT? Eat nutrient-rich foods, which will nourish your body and keep you healthy. The food you should eat also will depend on several factors, including:  The calories you need.  The medicines you take.  Your weight.  Your  blood glucose level.  Your blood pressure level.  Your cholesterol level. You should eat a variety of foods, including:  Protein.  Lean cuts of meat.  Proteins low in saturated fats, such as fish, egg whites, and beans. Avoid processed meats.  Fruits and vegetables.  Fruits and vegetables that may help control blood glucose levels, such as apples, mangoes, and yams.  Dairy products.  Choose fat-free or low-fat dairy products, such as milk, yogurt, and cheese.  Grains, bread, pasta, and rice.  Choose whole grain products, such as multigrain bread, whole oats, and brown rice. These foods may help control blood pressure.  Fats.  Foods containing healthful fats, such as nuts, avocado, olive oil, canola oil, and fish. DOES EVERYONE WITH DIABETES MELLITUS HAVE THE SAME MEAL PLAN? Because every person with diabetes mellitus is different, there is not one meal plan that works for everyone. It is very important that you meet with a dietitian who will help you create a meal plan that is just right for you.   This information is not intended to replace  advice given to you by your health care provider. Make sure you discuss any questions you have with your health care provider.   Document Released: 01/27/2005 Document Revised: 05/23/2014 Document Reviewed: 03/29/2013 Elsevier Interactive Patient Education 2016 [place].    Heart-Healthy Eating Plan Many factors influence your heart health, including eating and exercise habits. Heart (coronary) risk increases with abnormal blood fat (lipid) levels. Heart-healthy meal planning includes limiting unhealthy fats, increasing healthy fats, and making other small dietary changes. This includes maintaining a healthy body weight to help keep lipid levels within a normal range. WHAT IS MY PLAN?  Your health care provider recommends that you:  Get no more than _________% of the total calories in your daily diet from fat.  Limit your intake  of saturated fat to less than _________% of your total calories each day.  Limit the amount of cholesterol in your diet to less than _________ mg per day. WHAT TYPES OF FAT SHOULD I CHOOSE?  Choose healthy fats more often. Choose monounsaturated and polyunsaturated fats, such as olive oil and canola oil, flaxseeds, walnuts, almonds, and seeds.  Eat more omega-3 fats. Good choices include salmon, mackerel, sardines, tuna, flaxseed oil, and ground flaxseeds. Aim to eat fish at least two times each week.  Limit saturated fats. Saturated fats are primarily found in animal products, such as meats, butter, and cream. Plant sources of saturated fats include palm oil, palm kernel oil, and coconut oil.  Avoid foods with partially hydrogenated oils in them. These contain trans fats. Examples of foods that contain trans fats are stick margarine, some tub margarines, cookies, crackers, and other baked goods. WHAT GENERAL GUIDELINES DO I NEED TO FOLLOW?  Check food labels carefully to identify foods with trans fats or high amounts of saturated fat.  Fill one half of your plate with vegetables and green salads. Eat 4-5 servings of vegetables per day. A serving of vegetables equals 1 cup of raw leafy vegetables,  cup of raw or cooked cut-up vegetables, or  cup of vegetable juice.  Fill one fourth of your plate with whole grains. Look for the word "whole" as the first word in the ingredient list.  Fill one fourth of your plate with lean protein foods.  Eat 4-5 servings of fruit per day. A serving of fruit equals one medium whole fruit,  cup of dried fruit,  cup of fresh, frozen, or canned fruit, or  cup of 100% fruit juice.  Eat more foods that contain soluble fiber. Examples of foods that contain this type of fiber are apples, broccoli, carrots, beans, peas, and barley. Aim to get 20-30 g of fiber per day.  Eat more home-cooked food and less restaurant, buffet, and fast food.  Limit or avoid  alcohol.  Limit foods that are high in starch and sugar.  Avoid fried foods.  Cook foods by using methods other than frying. Baking, boiling, grilling, and broiling are all great options. Other fat-reducing suggestions include: 1. Removing the skin from poultry. 2. Removing all visible fats from meats. 3. Skimming the fat off of stews, soups, and gravies before serving them. 4. Steaming vegetables in water or broth.  Lose weight if you are overweight. Losing just 5-10% of your initial body weight can help your overall health and prevent diseases such as diabetes and heart disease.  Increase your consumption of nuts, legumes, and seeds to 4-5 servings per week. One serving of dried beans or legumes equals  cup after being cooked, one  serving of nuts equals 1 ounces, and one serving of seeds equals  ounce or 1 tablespoon.  You may need to monitor your salt (sodium) intake, especially if you have high blood pressure. Talk with your health care provider or dietitian to get more information about reducing sodium. WHAT FOODS CAN I EAT? Grains Breads, including [place], white, pita, wheat, raisin, rye, oatmeal, and [place]. Tortillas that are neither fried nor made with lard or trans fat. Low-fat rolls, including hotdog and hamburger buns and English muffins. Biscuits. Muffins. Waffles. Pancakes. Light popcorn. Whole-grain cereals. Flatbread. Melba toast. Pretzels. Breadsticks. Rusks. Low-fat snacks and crackers, including oyster, saltine, matzo, graham, animal, and rye. Rice and pasta, including brown rice and those that are made with whole wheat. Vegetables All vegetables. Fruits All fruits, but limit coconut. Meats and Other Protein Sources Lean, well-trimmed beef, veal, pork, and lamb. Chicken and [place] without skin. All fish and shellfish. Wild duck, rabbit, pheasant, and venison. Egg whites or low-cholesterol egg substitutes. Dried beans, peas, lentils, and tofu.Seeds and most  nuts. Dairy Low-fat or nonfat cheeses, including ricotta, string, and mozzarella. Skim or 1% milk that is liquid, powdered, or evaporated. Buttermilk that is made with low-fat milk. Nonfat or low-fat yogurt. Beverages Mineral water. Diet carbonated beverages. Sweets and Desserts Sherbets and fruit ices. Honey, jam, marmalade, jelly, and syrups. Meringues and gelatins. Pure sugar candy, such as hard candy, jelly beans, gumdrops, mints, marshmallows, and small amounts of dark chocolate. [employer] Eat all sweets and desserts in moderation. Fats and Oils Nonhydrogenated (trans-free) margarines. Vegetable oils, including soybean, sesame, sunflower, olive, peanut, safflower, corn, canola, and cottonseed. Salad dressings or mayonnaise that are made with a vegetable oil. Limit added fats and oils that you use for cooking, baking, salads, and as spreads. Other Cocoa powder. Coffee and tea. All seasonings and condiments. The items listed above may not be a complete list of recommended foods or beverages. Contact your dietitian for more options. WHAT FOODS ARE NOT RECOMMENDED? Grains Breads that are made with saturated or trans fats, oils, or whole milk. Croissants. Butter rolls. Cheese breads. Sweet rolls. Donuts. Buttered popcorn. Chow mein noodles. High-fat crackers, such as cheese or butter crackers. Meats and Other Protein Sources Fatty meats, such as hotdogs, short ribs, sausage, spareribs, bacon, ribeye roast or steak, and mutton. High-fat deli meats, such as salami and bologna. Caviar. Domestic duck and goose. Organ meats, such as kidney, liver, sweetbreads, brains, gizzard, chitterlings, and heart. Dairy Cream, sour cream, cream cheese, and creamed cottage cheese. Whole milk cheeses, including blue (bleu), [place], [place], [place], [place], [place], [place], [place], [place], and [place]. Whole or 2% milk that is liquid, evaporated, or condensed. Whole buttermilk. Cream sauce or high-fat  cheese sauce. Yogurt that is made from whole milk. Beverages Regular sodas and drinks with added sugar. Sweets and Desserts Frosting. Pudding. Cookies. Cakes other than angel food cake. Candy that has milk chocolate or white chocolate, hydrogenated fat, butter, coconut, or unknown ingredients. Buttered syrups. Full-fat ice cream or ice cream drinks. Fats and Oils Gravy that has suet, meat fat, or shortening. Cocoa butter, hydrogenated oils, palm oil, coconut oil, palm kernel oil. These can often be found in baked products, candy, fried foods, nondairy creamers, and whipped toppings. Solid fats and shortenings, including bacon fat, salt pork, lard, and butter. Nondairy cream substitutes, such as coffee creamers and sour cream substitutes. Salad dressings that are made of unknown oils, cheese, or sour cream. The items listed above may not be  a complete list of foods and beverages to avoid. Contact your dietitian for more information.   This information is not intended to replace advice given to you by your health care provider. Make sure you discuss any questions you have with your health care provider.   Document Released: 02/09/2008 Document Revised: 05/23/2014 Document Reviewed: 10/24/2013 Elsevier Interactive Patient Education 2016 [place].   Groin Site Care Refer to this sheet in the next few weeks. These instructions provide you with information on caring for yourself after your procedure. Your caregiver may also give you more specific instructions. Your treatment has been planned according to current medical practices, but problems sometimes occur. Call your caregiver if you have any problems or questions after your procedure. HOME CARE INSTRUCTIONS  You may shower 24 hours after the procedure. Remove the bandage (dressing) and gently wash the site with plain soap and water. Gently pat the site dry.   Do not apply powder or lotion to the site.   Do not sit in a bathtub, swimming  pool, or whirlpool for 5 to 7 days.   No bending, squatting, or lifting anything over 10 pounds (4.5 kg) as directed by your caregiver.   Inspect the site at least twice daily.   Do not drive home if you are discharged the same day of the procedure. Have someone else drive you.   You may drive 24 hours after the procedure unless otherwise instructed by your caregiver.  What to expect:  Any bruising will usually fade within 1 to 2 weeks.   Blood that collects in the tissue (hematoma) may be painful to the touch. It should usually decrease in size and tenderness within 1 to 2 weeks.  SEEK IMMEDIATE MEDICAL CARE IF:  You have unusual pain at the groin site or down the affected leg.   You have redness, warmth, swelling, or pain at the groin site.   You have drainage (other than a small amount of blood on the dressing).   You have chills.   You have a fever or persistent symptoms for more than 72 hours.   You have a fever and your symptoms suddenly get worse.   Your leg becomes pale, cool, tingly, or numb.  You have heavy bleeding from the site. Hold pressure on the site. Marland Kitchen

## 2015-11-07 NOTE — Plan of Care (Signed)
Problem: Education: Goal: Knowledge of  General Education information/materials will improve Outcome: Progressing Patient aware of plan of care.  RN instructed patient to call and wait for staff assistance when needing to get out of bed.  Patient stated understanding.  Patient has denied pain thus far this shift.  Patient and RN discussed Metoprolol that was scheduled to give earlier this shift. RN provided medication education pertaining to Metoprolol.  Patient stated she did not want to take Metoprolol medication until speaking with her doctor in the morning.

## 2015-11-09 ENCOUNTER — Telehealth: Payer: Self-pay | Admitting: Internal Medicine

## 2015-11-09 DIAGNOSIS — Z8679 Personal history of other diseases of the circulatory system: Secondary | ICD-10-CM

## 2015-11-09 DIAGNOSIS — Z9889 Other specified postprocedural states: Secondary | ICD-10-CM

## 2015-11-09 DIAGNOSIS — R0602 Shortness of breath: Secondary | ICD-10-CM

## 2015-11-09 NOTE — Telephone Encounter (Signed)
Additional   Pt call disconnected, she called back and stated she was having palpitations days ago but she is not currently

## 2015-11-09 NOTE — Telephone Encounter (Signed)
Follow up   Pt is calling back because has not heard back from the rn   Please call pt   Pt wants a call today

## 2015-11-09 NOTE — Telephone Encounter (Signed)
Pt c/o Shortness Of Breath: STAT if SOB developed within the last 24 hours or pt is noticeably SOB on the phone  1. Are you currently SOB (can you hear that pt is SOB on the phone)? no 2. How long have you been experiencing SOB? days 3. Are you SOB when sitting or when up moving around? both 4. Are you currently experiencing any other symptoms? No  Call disconnected please call pt

## 2015-11-09 NOTE — Telephone Encounter (Signed)
Called me back and says has had SOB with exertion since the procedure.  Goes away when she stops and takes a deep breath.  Also has had some palpitations and today felt as if it was going to go fast but stopped.  I let her know this all could be healing but I would discuss with Dr Lovena Le and let her know I would call her back.  If she has any fast rates I have instructed her to call the office and speak with a triage nurse to obtain an EKG

## 2015-11-11 ENCOUNTER — Other Ambulatory Visit: Payer: Self-pay | Admitting: *Deleted

## 2015-11-11 ENCOUNTER — Telehealth (HOSPITAL_COMMUNITY): Payer: Self-pay | Admitting: Internal Medicine

## 2015-11-11 MED ORDER — POTASSIUM CHLORIDE CRYS ER 10 MEQ PO TBCR: 10.0000 meq | EXTENDED_RELEASE_TABLET | Freq: Every day | ORAL | Status: DC | PRN

## 2015-11-11 MED ORDER — FUROSEMIDE 20 MG PO TABS: 20.0000 mg | ORAL_TABLET | Freq: Every day | ORAL | Status: DC | PRN

## 2015-11-11 NOTE — Telephone Encounter (Signed)
Patient called back and was sent to echo scheduling.  I will put in an order for echo now

## 2015-11-11 NOTE — Telephone Encounter (Signed)
Discussed with Dr Lovena Le and this all could be part of the healing process.  But we could get an echo to evaluate her SOB.  I have left a voicemail for the patient in regards to this and asked she call me back to discuss

## 2015-11-18 NOTE — Telephone Encounter (Signed)
Close encounter 

## 2015-11-24 NOTE — Telephone Encounter (Signed)
Left message for patient that she should not need echo if SOB is better and to return my call to discuss

## 2015-12-01 ENCOUNTER — Other Ambulatory Visit (HOSPITAL_COMMUNITY): Payer: BLUE CROSS/BLUE SHIELD

## 2015-12-01 NOTE — Telephone Encounter (Signed)
Patient never returned call but canceled the echo

## 2015-12-10 ENCOUNTER — Ambulatory Visit (INDEPENDENT_AMBULATORY_CARE_PROVIDER_SITE_OTHER): Payer: BLUE CROSS/BLUE SHIELD | Admitting: Internal Medicine

## 2015-12-10 ENCOUNTER — Encounter: Payer: Self-pay | Admitting: Internal Medicine

## 2015-12-10 VITALS — BP 142/80 | HR 89 | Ht 61.0 in | Wt 181.8 lb

## 2015-12-10 DIAGNOSIS — I10 Essential (primary) hypertension: Secondary | ICD-10-CM

## 2015-12-10 DIAGNOSIS — R0602 Shortness of breath: Secondary | ICD-10-CM | POA: Diagnosis not present

## 2015-12-10 DIAGNOSIS — I471 Supraventricular tachycardia, unspecified: Secondary | ICD-10-CM

## 2015-12-10 NOTE — Patient Instructions (Signed)
Medication Instructions:  Your physician recommends that you continue on your current medications as directed. Please refer to the Current Medication list given to you today.   Labwork: None ordered   Testing/Procedures: None ordered   Follow-Up: Your physician recommends that you schedule a follow-up appointment as needed   Any Other Special Instructions Will Be Listed Below (If Applicable).  Reduce salt intake and take Furosemide if needed     If you need a refill on your cardiac medications before your next appointment, please call your pharmacy.

## 2015-12-10 NOTE — Progress Notes (Signed)
HPI Karen [name] returns today for followup of SVT, s/p catheter ablation. She is a pleasant 60yo woman with a h/o CAD, s/p MI, HTN, who has experienced palpitations for several years, requiring IV adenosine for termination. She underwent EP Study and ablation of AVNRT several weeks ago. In the interim, she has had no recurrent SVT. She notes that she feels like her heart wants to start racing but does not. She notes occaissional episodes of palpitations. In addition, she notes episodes of sob. She admits to dietary indiscretion. No chest pain. Allergies  Allergen Reactions  . Clindamycin/Lincomycin Nausea And Vomiting and Other (See Comments)    Pt refuses to retry  . Erythromycin Other (See Comments)    Stomach cramps  . Lipitor [Atorvastatin] Nausea Only  . Penicillins Rash and Other (See Comments)    Has patient had a PCN reaction causing immediate rash, facial/tongue/throat swelling, SOB or lightheadedness with hypotension: Yes Has patient had a PCN reaction causing severe rash involving mucus membranes or skin necrosis: No Has patient had a PCN reaction that required hospitalization No Has patient had a PCN reaction occurring within the last 10 years: No If all of the above answers are "NO", then may proceed with Cephalosporin use.       Current Outpatient Prescriptions  Medication Sig Dispense Refill  . acetaminophen (TYLENOL) 500 MG tablet Take 1,000 mg by mouth every 6 (six) hours as needed (pain).     Marland Kitchen aspirin EC 81 MG EC tablet Take 1 tablet (81 mg total) by mouth daily.    . citalopram (CELEXA) 20 MG tablet Take 1 tablet (20 mg total) by mouth daily. 30 tablet 6  . clopidogrel (PLAVIX) 75 MG tablet Take 1 tablet (75 mg total) by mouth daily. 30 tablet 6  . furosemide (LASIX) 20 MG tablet Take 1 tablet (20 mg total) by mouth daily as needed for edema. 30 tablet 1  . gabapentin (NEURONTIN) 100 MG capsule Take 200 mg by mouth at bedtime.    . Insulin Glargine (LANTUS  SOLOSTAR) 100 UNIT/ML Solostar Pen Inject 60 Units into the skin 2 (two) times daily. QS 1 mo 12 pen 11  . Insulin Pen Needle (PEN NEEDLES) 31G X 8 MM MISC Use to inject Lantus as directed twice daily. Dx: E11.40 60 each 3  . KLOR-CON SPRINKLE 10 MEQ CR capsule Take 1 capsule by mouth daily as needed. When taking Lasix  1  . lisinopril (ZESTRIL) 2.5 MG tablet Take 1 tablet (2.5 mg total) by mouth daily. 90 tablet 1  . Loperamide HCl (IMODIUM A-D PO) Take 1-2 tablets by mouth daily as needed (for stomach).     . metFORMIN (GLUCOPHAGE) 1000 MG tablet Take 1 tablet (1,000 mg total) by mouth 2 (two) times daily with a meal. 180 tablet 3  . nitroGLYCERIN (NITROSTAT) 0.4 MG SL tablet Place 1 tablet (0.4 mg total) under the tongue every 5 (five) minutes as needed for chest pain. 25 tablet 2  . pantoprazole (PROTONIX) 40 MG tablet Take 1 tablet (40 mg total) by mouth daily. 30 tablet 3  . potassium chloride (K-DUR,KLOR-CON) 10 MEQ tablet Take 1 tablet (10 mEq total) by mouth daily as needed (with lasix). 30 tablet 1  . promethazine (PHENERGAN) 25 MG tablet Take 1 tablet by mouth every 8 (eight) hours as needed for nausea.   0  . simvastatin (ZOCOR) 40 MG tablet TAKE 1 TABLET (40 MG TOTAL) BY MOUTH DAILY. (Patient taking differently: Take  40 mg by mouth daily. ) 30 tablet 3   No current facility-administered medications for this visit.      Past Medical History:  Diagnosis Date  . CAD (coronary artery disease)    STEMI 07/2013 - treated with PTCA of the RCA followed by DES to the intermediate ramus  //  Myoview 5/17 - Normal perfusion. LVEF 71% with normal wall motion. This is a low risk study.  . Chronic bronchitis   . Depression   . GERD (gastroesophageal reflux disease)   . High blood pressure   . History of chicken pox   . History of ovarian cyst   . HLD (hyperlipidemia)   . Seasonal allergies   . Seizure ([place]) 1977   off AEDs  . SVT (supraventricular tachycardia) ([place]) 10/2015  . Syncopal  episodes   . Tachyarrhythmia    ER visits with adenosine push to control  . Tubal pregnancy   . Type 2 diabetes, uncontrolled, with neuropathy ([place])    dx 1993    ROS:   All systems reviewed and negative except as noted in the HPI.   Past Surgical History:  Procedure Laterality Date  . ABLATION OF DYSRHYTHMIC FOCUS  11/06/2015   SVT  . CARDIAC CATHETERIZATION  07/2013   angioplasty to RCA and DES to ramus intermedius  . DILATION AND CURETTAGE OF UTERUS  1984  . ECTOPIC PREGNANCY SURGERY Right 1984   tube removal 2/2 hemorrhage  . ELECTROPHYSIOLOGIC STUDY N/A 11/06/2015   Procedure: SVT Ablation;  Surgeon: Evans Lance, MD;  Location: [facility];  Service: Cardiovascular;  Laterality: N/A;  . LEFT HEART CATHETERIZATION WITH CORONARY ANGIOGRAM N/A 07/23/2013   Procedure: LEFT HEART CATHETERIZATION WITH CORONARY ANGIOGRAM;  Surgeon: Sinclair Grooms, MD;  Location: [facility] CATH LAB;  Service: Cardiovascular;  Laterality: N/A;  . OVARIAN CYST SURGERY  1998  . PERCUTANEOUS CORONARY STENT INTERVENTION (PCI-S) N/A 07/24/2013   Procedure: PERCUTANEOUS CORONARY STENT INTERVENTION (PCI-S);  Surgeon: Sinclair Grooms, MD;  Location: [facility] CATH LAB;  Service: Cardiovascular;  Laterality: N/A;  . ROTATOR CUFF REPAIR  2006   Right  . SUBMANDIBULAR DUCT LIGATION Right 10/2014   R submandibular gland stone removed [facility])  . TONSILLECTOMY AND ADENOIDECTOMY  1965     Family History  Problem Relation Age of Onset  . Diabetes Mother   . Hyperlipidemia Mother   . Hypertension Mother   . Stroke Mother   . Coronary artery disease Mother     CABG  . Heart disease Mother   . Diabetes Father   . Hypertension Father   . Hyperlipidemia Father   . Coronary artery disease Father     CABG  . Congestive Heart Failure Father   . Pancreatic cancer Father   . Arrhythmia Brother   . Diabetes Maternal Grandmother   . Diabetes Maternal Grandfather   . Diabetes Paternal Grandmother   . Diabetes  Paternal Grandfather   . Kidney disease Neg Hx   . Heart disease Brother      Social History   Social History  . Marital status: Widowed    Spouse name: N/A  . Number of children: 0  . Years of education: 12th grade   Occupational History  . 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 Smoking status: Never Smoker  . Smokeless tobacco: Never Used  . Alcohol use No  . Drug use: No  . Sexual activity: Not  on file   Other Topics Concern  . Not on file   Social History Narrative   Caffeine: 6 diet sodas/day   Lives with 2 dogs.   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   Activity: no regular exercise   Diet: some water, fruits/vegetables daily      BP (!) 142/80   Pulse 89   Ht 5\' 1"  (1.549 m)   Wt 181 lb 12.8 oz (82.5 kg)   BMI 34.35 kg/m   Physical Exam:  obese appearing middle aged woman, NAD HEENT: Unremarkable Neck:  6 cm JVD, no thyromegally Lymphatics:  No adenopathy Back:  No CVA tenderness Lungs:  Clear with no wheezes HEART:  Regular rate rhythm, no murmurs, no rubs, no clicks Abd:  soft, positive bowel sounds, no organomegally, no rebound, no guarding Ext:  2 plus pulses, no edema, no cyanosis, no clubbing Skin:  No rashes no nodules Neuro:  CN II through XII intact, motor grossly intact  EKG - nsr with no pre-excitation  DEVICE  Normal device function.  See PaceArt for details.   Assess/Plan: 1. SVT - she is s/p catheter ablation and doing well. She will undergo watchful waiting. 2. CAD - she denies anginal symptoms.  3. HTN - her blood pressure is a bit elevated. I will defer switching of her beta blocker to her cardiologist. 4. Sob - etiology is unclear. I have asked her to reduce her salt intake and to take lasix as needed.  Mikle Bosworth.D.

## 2015-12-13 ENCOUNTER — Encounter: Payer: Self-pay | Admitting: Family Medicine

## 2015-12-14 ENCOUNTER — Other Ambulatory Visit: Payer: Self-pay | Admitting: *Deleted

## 2015-12-14 MED ORDER — FUROSEMIDE 20 MG PO TABS: 20.0000 mg | ORAL_TABLET | Freq: Every day | ORAL | 1 refills | Status: DC | PRN

## 2015-12-14 MED ORDER — SIMVASTATIN 40 MG PO TABS: ORAL_TABLET | ORAL | 3 refills | Status: DC

## 2015-12-14 MED ORDER — POTASSIUM CHLORIDE CRYS ER 10 MEQ PO TBCR: 10.0000 meq | EXTENDED_RELEASE_TABLET | Freq: Every day | ORAL | 1 refills | Status: DC | PRN

## 2015-12-22 ENCOUNTER — Encounter: Payer: Self-pay | Admitting: Interventional Cardiology

## 2015-12-22 ENCOUNTER — Ambulatory Visit (INDEPENDENT_AMBULATORY_CARE_PROVIDER_SITE_OTHER): Payer: BLUE CROSS/BLUE SHIELD | Admitting: Interventional Cardiology

## 2015-12-22 VITALS — BP 114/66 | HR 78 | Ht 61.0 in | Wt 180.6 lb

## 2015-12-22 DIAGNOSIS — R05 Cough: Secondary | ICD-10-CM

## 2015-12-22 DIAGNOSIS — R058 Other specified cough: Secondary | ICD-10-CM | POA: Insufficient documentation

## 2015-12-22 DIAGNOSIS — I2510 Atherosclerotic heart disease of native coronary artery without angina pectoris: Secondary | ICD-10-CM

## 2015-12-22 DIAGNOSIS — T464X5A Adverse effect of angiotensin-converting-enzyme inhibitors, initial encounter: Secondary | ICD-10-CM | POA: Insufficient documentation

## 2015-12-22 DIAGNOSIS — I471 Supraventricular tachycardia: Secondary | ICD-10-CM | POA: Diagnosis not present

## 2015-12-22 DIAGNOSIS — E1165 Type 2 diabetes mellitus with hyperglycemia: Secondary | ICD-10-CM

## 2015-12-22 DIAGNOSIS — I10 Essential (primary) hypertension: Secondary | ICD-10-CM | POA: Diagnosis not present

## 2015-12-22 DIAGNOSIS — IMO0002 Reserved for concepts with insufficient information to code with codable children: Secondary | ICD-10-CM

## 2015-12-22 DIAGNOSIS — Z9861 Coronary angioplasty status: Secondary | ICD-10-CM

## 2015-12-22 DIAGNOSIS — E1140 Type 2 diabetes mellitus with diabetic neuropathy, unspecified: Secondary | ICD-10-CM

## 2015-12-22 MED ORDER — LOSARTAN POTASSIUM 25 MG PO TABS: 25.0000 mg | ORAL_TABLET | Freq: Every day | ORAL | 3 refills | Status: DC

## 2015-12-22 NOTE — Patient Instructions (Signed)
Medication Instructions:  Your physician has recommended you make the following change in your medication:  1) STOP Plavix 2) STOP Lisinopril 3) START Losartan 25mg  daily. An Rx has been sent to your pharmacy   Labwork: None ordered  Testing/Procedures: None ordered  Follow-Up: Your physician wants you to follow-up in: 1 year with Dr.Smith You will receive a reminder letter in the mail two months in advance. If you don't receive a letter, please call our office to schedule the follow-up appointment.   Any Other Special Instructions Will Be Listed Below (If Applicable).     If you need a refill on your cardiac medications before your next appointment, please call your pharmacy.

## 2015-12-22 NOTE — Progress Notes (Signed)
Cardiology Office Note    Date:  12/22/2015   ID:  [name], [name] 1956-05-16, MRN [id number]  PCP:  [name], MD  Cardiologist: Sinclair Grooms, MD   Chief Complaint  Patient presents with  . Coronary Artery Disease    History of Present Illness:  Karen [name] is a 60 y.o. female who has CAD, had stenting of the right coronary and ramus intermedius in March 2015, history of SVT status post ablation in May 2017, diabetes mellitus,  Maddielynn complains of a dry hacking cough. She otherwise feels well. She denies angina. She has occasional shortness of breath. She has occasional brief palpitations. No sustained SVT since ablation.     Past Medical History:  Diagnosis Date  . CAD (coronary artery disease)    STEMI 07/2013 - treated with PTCA of the RCA followed by DES to the intermediate ramus  //  Myoview 5/17 - Normal perfusion. LVEF 71% with normal wall motion. This is a low risk study.  . Chronic bronchitis   . Depression   . GERD (gastroesophageal reflux disease)   . High blood pressure   . History of chicken pox   . History of ovarian cyst   . HLD (hyperlipidemia)   . Seasonal allergies   . Seizure ([place]) 1977   off AEDs  . SVT (supraventricular tachycardia) ([place]) 10/2015   s/p catheter ablation  . Syncopal episodes   . Tachyarrhythmia    ER visits with adenosine push to control  . Tubal pregnancy   . Type 2 diabetes, uncontrolled, with neuropathy ([place])    dx 1993    Past Surgical History:  Procedure Laterality Date  . ABLATION OF DYSRHYTHMIC FOCUS  11/06/2015   SVT  . CARDIAC CATHETERIZATION  07/2013   angioplasty to RCA and DES to ramus intermedius  . DILATION AND CURETTAGE OF UTERUS  1984  . ECTOPIC PREGNANCY SURGERY Right 1984   tube removal 2/2 hemorrhage  . ELECTROPHYSIOLOGIC STUDY N/A 11/06/2015   SVT Ablation;  Evans Lance, MD;  Location: [facility]  . LEFT HEART CATHETERIZATION WITH CORONARY ANGIOGRAM N/A 07/23/2013   Procedure:  LEFT HEART CATHETERIZATION WITH CORONARY ANGIOGRAM;  Surgeon: Sinclair Grooms, MD;  Location: [facility] CATH LAB;  Service: Cardiovascular;  Laterality: N/A;  . OVARIAN CYST SURGERY  1998  . PERCUTANEOUS CORONARY STENT INTERVENTION (PCI-S) N/A 07/24/2013   Procedure: PERCUTANEOUS CORONARY STENT INTERVENTION (PCI-S);  Surgeon: Sinclair Grooms, MD;  Location: [facility] CATH LAB;  Service: Cardiovascular;  Laterality: N/A;  . ROTATOR CUFF REPAIR  2006   Right  . SUBMANDIBULAR DUCT LIGATION Right 10/2014   R submandibular gland stone removed [facility])  . TONSILLECTOMY AND ADENOIDECTOMY  1965    Current Medications: Outpatient Medications Prior to Visit  Medication Sig Dispense Refill  . acetaminophen (TYLENOL) 500 MG tablet Take 1,000 mg by mouth every 6 (six) hours as needed (pain).     Marland Kitchen aspirin EC 81 MG EC tablet Take 1 tablet (81 mg total) by mouth daily.    . citalopram (CELEXA) 20 MG tablet Take 1 tablet (20 mg total) by mouth daily. 30 tablet 6  . furosemide (LASIX) 20 MG tablet Take 1 tablet (20 mg total) by mouth daily as needed for edema. 30 tablet 1  . gabapentin (NEURONTIN) 100 MG capsule Take 200 mg by mouth at bedtime.    . Insulin Glargine (LANTUS SOLOSTAR) 100 UNIT/ML Solostar Pen Inject 60 Units into the  skin 2 (two) times daily. QS 1 mo 12 pen 11  . Insulin Pen Needle (PEN NEEDLES) 31G X 8 MM MISC Use to inject Lantus as directed twice daily. Dx: E11.40 60 each 3  . KLOR-CON SPRINKLE 10 MEQ CR capsule Take 1 capsule by mouth daily as needed. When taking Lasix  1  . Loperamide HCl (IMODIUM A-D PO) Take 1-2 tablets by mouth daily as needed (for stomach).     . metFORMIN (GLUCOPHAGE) 1000 MG tablet Take 1 tablet (1,000 mg total) by mouth 2 (two) times daily with a meal. 180 tablet 3  . nitroGLYCERIN (NITROSTAT) 0.4 MG SL tablet Place 1 tablet (0.4 mg total) under the tongue every 5 (five) minutes as needed for chest pain. 25 tablet 2  . pantoprazole (PROTONIX) 40 MG tablet Take 1 tablet (40  mg total) by mouth daily. 30 tablet 3  . promethazine (PHENERGAN) 25 MG tablet Take 1 tablet by mouth every 8 (eight) hours as needed for nausea.   0  . simvastatin (ZOCOR) 40 MG tablet TAKE 1 TABLET (40 MG TOTAL) BY MOUTH DAILY. 30 tablet 3  . clopidogrel (PLAVIX) 75 MG tablet Take 1 tablet (75 mg total) by mouth daily. 30 tablet 6  . lisinopril (ZESTRIL) 2.5 MG tablet Take 1 tablet (2.5 mg total) by mouth daily. 90 tablet 1  . potassium chloride (K-DUR,KLOR-CON) 10 MEQ tablet Take 1 tablet (10 mEq total) by mouth daily as needed (with lasix). (Patient not taking: Reported on 12/22/2015) 30 tablet 1   No facility-administered medications prior to visit.      Allergies:   Clindamycin/lincomycin; Erythromycin; Lipitor [atorvastatin]; and Penicillins   Social History   Social History  . Marital status: Widowed    Spouse name: N/A  . Number of children: 0  . Years of education: 12th grade   Occupational History  . 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 Smoking status: Never Smoker  . Smokeless tobacco: Never Used  . Alcohol use No  . Drug use: No  . Sexual activity: Not Asked   Other Topics Concern  . None   Social History Narrative   Caffeine: 6 diet sodas/day   Lives with 2 dogs.   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   ROS:   Please see the history of present illness.    Cough, occasional  momentary shortness of breath, brief irregular heartbeat but no prolonged palpitations since the ablation.  All other systems reviewed and are negative.   PHYSICAL EXAM:   VS:  BP 114/66   Pulse 78   Ht 5\' 1"  (1.549 m)   Wt 180 lb 9.6 oz (81.9 kg)   BMI 34.12 kg/m    GEN: Well nourished, well developed, in no acute distress  HEENT: normal  Neck: no JVD, carotid bruits, or masses Cardiac: RRR; no murmurs, rubs, or gallops,no edema  Respiratory:  clear to auscultation bilaterally, normal work of breathing GI: soft,  nontender, nondistended, + BS MS: no deformity or atrophy  Skin: warm and dry, no rash Neuro:  Alert and Oriented x 3, Strength and sensation are intact Psych: euthymic mood, full affect  Wt Readings from Last 3 Encounters:  12/22/15 180 lb 9.6 oz (81.9 kg)  12/10/15 181 lb 12.8 oz (82.5 kg)  11/07/15 179 lb 1.6 oz (81.2 kg)      Studies/Labs Reviewed:   EKG:  EKG  Is not repeated  Recent Labs: 07/02/2015: ALT 21; TSH 0.84 11/06/2015: BUN 9; Creatinine, Ser 0.66; Hemoglobin 13.0; Platelets 137; Potassium 3.8; Sodium 138   Lipid Panel    Component Value Date/Time   CHOL 294 (H) 07/02/2015 1351   TRIG (H) 07/02/2015 1351    837.0 Triglyceride is over 400; calculations on Lipids are invalid.   HDL 40.00 07/02/2015 1351   CHOLHDL 7 07/02/2015 1351   VLDL 159.8 (H) 02/21/2014 0905   LDLCALC UNABLE TO CALCULATE IF TRIGLYCERIDE OVER 400 mg/dL 07/24/2013 0020   LDLDIRECT 114.0 07/02/2015 1351    Additional studies/ records that were reviewed today include:  Reviewed ablation report      ASSESSMENT:    1. CAD S/P percutaneous coronary angioplasty   2. Paroxysmal SVT (supraventricular tachycardia) ([place])   3. Essential hypertension   4. Cough due to ACE inhibitor   5. Type 2 diabetes, uncontrolled, with neuropathy ([facility])      PLAN:  In order of problems listed above:  1. Doing well now 2 years status post right coronary and ramus intermedius stenting.  Discontinue Plavix. 2. Status post post-ablation without prolonged episodes 3. Blood pressure is under control. We'll use very low dose losartan, 25 mg daily instead of lisinopril as she is complaining of a dry hacking cough. 4. We will switch from lisinopril 2.5 mg per day to losartan 25 mg daily 5. As noted above we'll continue with angiotensin rennin blockade for kidney protection in the setting of diabetes..    Medication Adjustments/Labs and Tests Ordered: Current medicines are reviewed at length with the patient today.  Concerns regarding medicines are outlined above.  Medication changes, Labs and Tests ordered today are listed in the Patient Instructions below. Patient Instructions  Medication Instructions:  Your physician has recommended you make the following change in your medication:  1) STOP Plavix 2) STOP Lisinopril 3) START Losartan 25mg  daily. An Rx has been sent to your pharmacy   Labwork: None ordered  Testing/Procedures: None ordered  Follow-Up: Your physician wants you to follow-up in: 1 year with Dr.Jena Tegeler You will receive a reminder letter in the mail two months in advance. If you don't receive a letter, please call our office to schedule the follow-up appointment.   Any Other Special Instructions Will Be Listed Below (If Applicable).     If you need a refill on your cardiac medications before your next appointment, please call your pharmacy.      Signed, Sinclair Grooms, MD  12/22/2015 2:55 PM    [address], [place], [place]  [address] Phone: [phone]; Fax: [phone]

## 2016-01-04 ENCOUNTER — Encounter: Payer: Self-pay | Admitting: Family Medicine

## 2016-01-06 ENCOUNTER — Encounter: Payer: Self-pay | Admitting: Family Medicine

## 2016-01-06 NOTE — Telephone Encounter (Signed)
Please see Mychart message in regards to gabapentin

## 2016-01-11 ENCOUNTER — Encounter: Payer: Self-pay | Admitting: Family Medicine

## 2016-01-11 ENCOUNTER — Ambulatory Visit (INDEPENDENT_AMBULATORY_CARE_PROVIDER_SITE_OTHER): Payer: BLUE CROSS/BLUE SHIELD | Admitting: Family Medicine

## 2016-01-11 ENCOUNTER — Encounter: Payer: Self-pay | Admitting: *Deleted

## 2016-01-11 VITALS — BP 150/80 | HR 88 | Temp 97.9°F | Wt 178.5 lb

## 2016-01-11 DIAGNOSIS — IMO0002 Reserved for concepts with insufficient information to code with codable children: Secondary | ICD-10-CM

## 2016-01-11 DIAGNOSIS — H8110 Benign paroxysmal vertigo, unspecified ear: Secondary | ICD-10-CM | POA: Insufficient documentation

## 2016-01-11 DIAGNOSIS — E1165 Type 2 diabetes mellitus with hyperglycemia: Secondary | ICD-10-CM | POA: Diagnosis not present

## 2016-01-11 DIAGNOSIS — H8112 Benign paroxysmal vertigo, left ear: Secondary | ICD-10-CM

## 2016-01-11 DIAGNOSIS — E1140 Type 2 diabetes mellitus with diabetic neuropathy, unspecified: Secondary | ICD-10-CM

## 2016-01-11 NOTE — Assessment & Plan Note (Signed)
Pt states she will return within the month for DM f/u visit. Declines labs today.

## 2016-01-11 NOTE — Progress Notes (Signed)
BP (!) 150/80 (BP Location: Right Arm, Cuff Size: Normal)   Pulse 88   Temp 97.9 F (36.6 C) (Oral)   Wt 178 lb 8 oz (81 kg)   BMI 33.73 kg/m    CC: dizziness Subjective:    Patient ID: Karen [name], female    DOB: 1955/07/06, 60 y.o.   MRN: [id number]  HPI: Karen [name] is a 60 y.o. female presenting on 01/11/2016 for Dizziness   60 yo with uncontrolled diabetes, CAD s/p STEMI 2015 (plavix recently stopped), tachyarrhythmias s/p multiple ER visits treated with adenosine latest SVT ablation 10/2015.   1.5 wk h/o dizziness described as "room spinning" that can last minutes, "feels like inner ear" precipitated by sudden head movements. Tried meclizine but unsure if this is helping. + nausea no vomiting, + tinnitus. No hearing loss. No LOC, no recent palpitations, no chest pain, dyspnea.   Lab Results  Component Value Date   HGBA1C 12.3 (H) 07/02/2015     Relevant past medical, surgical, family and social history reviewed and updated as indicated. Interim medical history since our last visit reviewed. Allergies and medications reviewed and updated. Current Outpatient Prescriptions on File Prior to Visit  Medication Sig  . acetaminophen (TYLENOL) 500 MG tablet Take 1,000 mg by mouth every 6 (six) hours as needed (pain).   Marland Kitchen aspirin EC 81 MG EC tablet Take 1 tablet (81 mg total) by mouth daily.  . citalopram (CELEXA) 20 MG tablet Take 1 tablet (20 mg total) by mouth daily.  . furosemide (LASIX) 20 MG tablet Take 1 tablet (20 mg total) by mouth daily as needed for edema.  . gabapentin (NEURONTIN) 100 MG capsule Take 200 mg by mouth at bedtime.  . Insulin Glargine (LANTUS SOLOSTAR) 100 UNIT/ML Solostar Pen Inject 60 Units into the skin 2 (two) times daily. QS 1 mo  . Insulin Pen Needle (PEN NEEDLES) 31G X 8 MM MISC Use to inject Lantus as directed twice daily. Dx: E11.40  . KLOR-CON SPRINKLE 10 MEQ CR capsule Take 1 capsule by mouth daily as needed. When taking Lasix  . metFORMIN  (GLUCOPHAGE) 1000 MG tablet Take 1 tablet (1,000 mg total) by mouth 2 (two) times daily with a meal.  . pantoprazole (PROTONIX) 40 MG tablet Take 1 tablet (40 mg total) by mouth daily.  . simvastatin (ZOCOR) 40 MG tablet TAKE 1 TABLET (40 MG TOTAL) BY MOUTH DAILY.  Marland Kitchen Loperamide HCl (IMODIUM A-D PO) Take 1-2 tablets by mouth daily as needed (for stomach).   . losartan (COZAAR) 25 MG tablet Take 1 tablet (25 mg total) by mouth daily. (Patient not taking: Reported on 01/11/2016)  . nitroGLYCERIN (NITROSTAT) 0.4 MG SL tablet Place 1 tablet (0.4 mg total) under the tongue every 5 (five) minutes as needed for chest pain. (Patient not taking: Reported on 01/11/2016)  . promethazine (PHENERGAN) 25 MG tablet Take 1 tablet by mouth every 8 (eight) hours as needed for nausea.    No current facility-administered medications on file prior to visit.     Review of Systems Per HPI unless specifically indicated in ROS section     Objective:    BP (!) 150/80 (BP Location: Right Arm, Cuff Size: Normal)   Pulse 88   Temp 97.9 F (36.6 C) (Oral)   Wt 178 lb 8 oz (81 kg)   BMI 33.73 kg/m   Wt Readings from Last 3 Encounters:  01/11/16 178 lb 8 oz (81 kg)  12/22/15 180 lb 9.6 oz (  81.9 kg)  12/10/15 181 lb 12.8 oz (82.5 kg)    Physical Exam  Constitutional: She is oriented to person, place, and time. She appears well-developed and well-nourished. No distress.  HENT:  Head: Normocephalic and atraumatic.  Right Ear: Tympanic membrane, external ear and ear canal normal. Decreased hearing is noted.  Left Ear: Hearing, tympanic membrane, external ear and ear canal normal.  Nose: Nose normal. No mucosal edema or rhinorrhea.  Mouth/Throat: Uvula is midline, oropharynx is clear and moist and mucous membranes are normal. No oropharyngeal exudate, posterior oropharyngeal edema, posterior oropharyngeal erythema or tonsillar abscesses.  Eyes: Conjunctivae and EOM are normal. Pupils are equal, round, and reactive to  light.  Neck: Normal range of motion. Neck supple.  Neurological: She is alert and oriented to person, place, and time. She has normal strength. No cranial nerve deficit or sensory deficit. Coordination normal.  CN 2-12 intact Dix Hallpike positive on LEFT with rotatory nystagmus S/p epley canalith repositioning maneuvers  Skin: Skin is warm and dry. No rash noted.  Nursing note and vitals reviewed.     Assessment & Plan:   Problem List Items Addressed This Visit    BPPV (benign paroxysmal positional vertigo) - Primary    Story and exam consistent with BPPV on left. Treated in office with epley canalith repositioning maneuvers. Provided with home exercises. Discussed meclizine use.  Update if not improved with treatment.       Type 2 diabetes, uncontrolled, with neuropathy ([place])    Pt states she will return within the month for DM f/u visit. Declines labs today.        Other Visit Diagnoses   None.      Follow up plan: Return in about 4 weeks (around 02/08/2016).  Karen Bush, MD

## 2016-01-11 NOTE — Assessment & Plan Note (Signed)
Story and exam consistent with BPPV on left. Treated in office with epley canalith repositioning maneuvers. Provided with home exercises. Discussed meclizine use.  Update if not improved with treatment.

## 2016-01-11 NOTE — Patient Instructions (Addendum)
Return in the next month for fasting labs and afterwards diabetes check.  You have positional vertigo  Do epley repositioning maneuvers at home. Handout provided today.  Benign Positional Vertigo Vertigo is the feeling that you or your surroundings are moving when they are not. Benign positional vertigo is the most common form of vertigo. The cause of this condition is not serious (is benign). This condition is triggered by certain movements and positions (is positional). This condition can be dangerous if it occurs while you are doing something that could endanger you or others, such as driving.  CAUSES In many cases, the cause of this condition is not known. It may be caused by a disturbance in an area of the inner ear that helps your brain to sense movement and balance. This disturbance can be caused by a viral infection (labyrinthitis), head injury, or repetitive motion. RISK FACTORS This condition is more likely to develop in:  Women.  People who are 58 years of age or older. SYMPTOMS Symptoms of this condition usually happen when you move your head or your eyes in different directions. Symptoms may start suddenly, and they usually last for less than a minute. Symptoms may include:  Loss of balance and falling.  Feeling like you are spinning or moving.  Feeling like your surroundings are spinning or moving.  Nausea and vomiting.  Blurred vision.  Dizziness.  Involuntary eye movement (nystagmus). Symptoms can be mild and cause only slight annoyance, or they can be severe and interfere with daily life. Episodes of benign positional vertigo may return (recur) over time, and they may be triggered by certain movements. Symptoms may improve over time. DIAGNOSIS This condition is usually diagnosed by medical history and a physical exam of the head, neck, and ears. You may be referred to a health care provider who specializes in ear, nose, and throat (ENT) problems (otolaryngologist) or  a provider who specializes in disorders of the nervous system (neurologist). You may have additional testing, including:  MRI.  A CT scan.  Eye movement tests. Your health care provider may ask you to change positions quickly while he or she watches you for symptoms of benign positional vertigo, such as nystagmus. Eye movement may be tested with an electronystagmogram (ENG), caloric stimulation, the Dix-Hallpike test, or the roll test.  An electroencephalogram (EEG). This records electrical activity in your brain.  Hearing tests. TREATMENT Usually, your health care provider will treat this by moving your head in specific positions to adjust your inner ear back to normal. Surgery may be needed in severe cases, but this is rare. In some cases, benign positional vertigo may resolve on its own in 2-4 weeks. HOME CARE INSTRUCTIONS Safety  Move slowly.Avoid sudden body or head movements.  Avoid driving.  Avoid operating heavy machinery.  Avoid doing any tasks that would be dangerous to you or others if a vertigo episode would occur.  If you have trouble walking or keeping your balance, try using a cane for stability. If you feel dizzy or unstable, sit down right away.  Return to your normal activities as told by your health care provider. Ask your health care provider what activities are safe for you. General Instructions  Take over-the-counter and prescription medicines only as told by your health care provider.  Avoid certain positions or movements as told by your health care provider.  Drink enough fluid to keep your urine clear or pale yellow.  Keep all follow-up visits as told by your health care  provider. This is important. SEEK MEDICAL CARE IF:  You have a fever.  Your condition gets worse or you develop new symptoms.  Your family or friends notice any behavioral changes.  Your nausea or vomiting gets worse.  You have numbness or a "pins and needles" sensation. SEEK  IMMEDIATE MEDICAL CARE IF:  You have difficulty speaking or moving.  You are always dizzy.  You faint.  You develop severe headaches.  You have weakness in your legs or arms.  You have changes in your hearing or vision.  You develop a stiff neck.  You develop sensitivity to light.   This information is not intended to replace advice given to you by your health care provider. Make sure you discuss any questions you have with your health care provider.   Document Released: 02/07/2006 Document Revised: 01/21/2015 Document Reviewed: 08/25/2014 Elsevier Interactive Patient Education [employer].

## 2016-01-11 NOTE — Progress Notes (Signed)
Pre visit review using our clinic review tool, if applicable. No additional management support is needed unless otherwise documented below in the visit note. 

## 2016-02-04 ENCOUNTER — Telehealth: Payer: Self-pay | Admitting: Family Medicine

## 2016-02-04 MED ORDER — GABAPENTIN 100 MG PO CAPS
200.0000 mg | ORAL_CAPSULE | Freq: Two times a day (BID) | ORAL | 3 refills | Status: DC
Start: 2016-02-04 — End: 2016-03-24

## 2016-02-04 NOTE — Telephone Encounter (Signed)
plz notify new dose is gabapentin 200mg  BID.

## 2016-02-04 NOTE — Telephone Encounter (Signed)
Lars Mage at [facility] called because she needs clarification on the instructions for the Gabapentin RX.  She is not clear on the dosage.  Can you please call her at [phone].

## 2016-02-04 NOTE — Telephone Encounter (Signed)
Pharmacy advised  

## 2016-02-08 ENCOUNTER — Encounter: Payer: Self-pay | Admitting: *Deleted

## 2016-02-08 ENCOUNTER — Ambulatory Visit (INDEPENDENT_AMBULATORY_CARE_PROVIDER_SITE_OTHER): Payer: BLUE CROSS/BLUE SHIELD | Admitting: Family Medicine

## 2016-02-08 ENCOUNTER — Encounter: Payer: Self-pay | Admitting: Family Medicine

## 2016-02-08 VITALS — BP 124/72 | HR 76 | Temp 97.7°F | Wt 178.5 lb

## 2016-02-08 DIAGNOSIS — E785 Hyperlipidemia, unspecified: Secondary | ICD-10-CM | POA: Diagnosis not present

## 2016-02-08 DIAGNOSIS — E1140 Type 2 diabetes mellitus with diabetic neuropathy, unspecified: Secondary | ICD-10-CM

## 2016-02-08 DIAGNOSIS — IMO0002 Reserved for concepts with insufficient information to code with codable children: Secondary | ICD-10-CM

## 2016-02-08 DIAGNOSIS — R509 Fever, unspecified: Secondary | ICD-10-CM | POA: Diagnosis not present

## 2016-02-08 DIAGNOSIS — E1165 Type 2 diabetes mellitus with hyperglycemia: Secondary | ICD-10-CM

## 2016-02-08 LAB — HEMOGLOBIN A1C: HEMOGLOBIN A1C: 10.6 % — AB (ref 4.6–6.5)

## 2016-02-08 LAB — LIPID PANEL
CHOLESTEROL: 210 mg/dL — AB (ref 0–200)
HDL: 36.4 mg/dL — ABNORMAL LOW (ref >39.00)
Total CHOL/HDL Ratio: 6

## 2016-02-08 LAB — BASIC METABOLIC PANEL
BUN: 9 mg/dL (ref 6–23)
CALCIUM: 8.9 mg/dL (ref 8.4–10.5)
CHLORIDE: 102 meq/L (ref 96–112)
CO2: 28 meq/L (ref 19–32)
CREATININE: 0.59 mg/dL (ref 0.40–1.20)
GFR: 111.09 mL/min (ref >60.00)
Glucose, Bld: 207 mg/dL — ABNORMAL HIGH (ref 70–99)
Potassium: 4.5 mEq/L (ref 3.5–5.1)
SODIUM: 137 meq/L (ref 135–145)

## 2016-02-08 LAB — POC INFLUENZA A&B (BINAX/QUICKVUE)
Influenza A, POC: NEGATIVE
Influenza B, POC: NEGATIVE

## 2016-02-08 LAB — LDL CHOLESTEROL, DIRECT: Direct LDL: 88 mg/dL

## 2016-02-08 NOTE — Assessment & Plan Note (Addendum)
Flu swab negative Unspecified febrile illness. Given short duration anticipate self limited viral process, possible viral gastroenteritis given sick contact exposure.  Discussed supportive treatment plan - push fluids, rest, ok to continue tylenol - and red flags to seek further care also reviewed.

## 2016-02-08 NOTE — Assessment & Plan Note (Addendum)
Update A1c. Pt reports compliance with antihyperglycemic regimen.

## 2016-02-08 NOTE — Progress Notes (Signed)
Pre visit review using our clinic review tool, if applicable. No additional management support is needed unless otherwise documented below in the visit note. 

## 2016-02-08 NOTE — Patient Instructions (Addendum)
Flu swab negative today. I think you have some sort of viral infection - possible gastroenteritis given sister's recent illness. Time will tell. Push fluids and rest, ok to continue tylenol as needed for fever. Watch for ongoing fever >101 past 5 days, or if new severe symptoms develop.  Labs for diabetes today.  Return as needed or in 3 months for diabetes follow up visit.

## 2016-02-08 NOTE — Progress Notes (Signed)
BP 124/72   Pulse 76   Temp 97.7 F (36.5 C) (Oral)   Wt 178 lb 8 oz (81 kg)   BMI 33.73 kg/m    CC: fever, body aches Subjective:    Patient ID: Karen [name], female    DOB: 07-20-1955, 60 y.o.   MRN: [id number]  HPI: Karen [name] is a 60 y.o. female presenting on 02/08/2016 for Fever (yesterday; body aches and chills; taking tylenol)   2d h/o fever to 101 treated with tylenol. Last took tylenol this morning. Also with chills, body aches. Mild R earache now better. Sister recently with similar symptoms, started with diarrhea. Pt was arund sister over weekend.   No cough, congestion, rhinorrhea, ST, headache, tooth pain, abdominal pain, nausea/vomiting, diarrhea, urinary symptoms (dysuria, urgency, hesitancy). No new skin rash or skin infection.  Has not yet had flu shot.   Vertigo has resolved.  Reports compliance with lantus 60u bid and metformin 1000mg  bid.  Lab Results  Component Value Date   HGBA1C 12.3 (H) 07/02/2015     Relevant past medical, surgical, family and social history reviewed and updated as indicated. Interim medical history since our last visit reviewed. Allergies and medications reviewed and updated. Current Outpatient Prescriptions on File Prior to Visit  Medication Sig  . acetaminophen (TYLENOL) 500 MG tablet Take 1,000 mg by mouth every 6 (six) hours as needed (pain).   Marland Kitchen aspirin EC 81 MG EC tablet Take 1 tablet (81 mg total) by mouth daily.  . citalopram (CELEXA) 20 MG tablet Take 1 tablet (20 mg total) by mouth daily.  Marland Kitchen gabapentin (NEURONTIN) 100 MG capsule Take 2 capsules (200 mg total) by mouth 2 (two) times daily.  . Insulin Glargine (LANTUS SOLOSTAR) 100 UNIT/ML Solostar Pen Inject 60 Units into the skin 2 (two) times daily. QS 1 mo  . Insulin Pen Needle (PEN NEEDLES) 31G X 8 MM MISC Use to inject Lantus as directed twice daily. Dx: E11.40  . Loperamide HCl (IMODIUM A-D PO) Take 1-2 tablets by mouth daily as needed (for stomach).   . losartan  (COZAAR) 25 MG tablet Take 1 tablet (25 mg total) by mouth daily.  . metFORMIN (GLUCOPHAGE) 1000 MG tablet Take 1 tablet (1,000 mg total) by mouth 2 (two) times daily with a meal.  . nitroGLYCERIN (NITROSTAT) 0.4 MG SL tablet Place 1 tablet (0.4 mg total) under the tongue every 5 (five) minutes as needed for chest pain.  . pantoprazole (PROTONIX) 40 MG tablet Take 1 tablet (40 mg total) by mouth daily.  . promethazine (PHENERGAN) 25 MG tablet Take 1 tablet by mouth every 8 (eight) hours as needed for nausea.   . simvastatin (ZOCOR) 40 MG tablet TAKE 1 TABLET (40 MG TOTAL) BY MOUTH DAILY.  . furosemide (LASIX) 20 MG tablet Take 1 tablet (20 mg total) by mouth daily as needed for edema. (Patient not taking: Reported on 02/08/2016)  . KLOR-CON SPRINKLE 10 MEQ CR capsule Take 1 capsule by mouth daily as needed. When taking Lasix   No current facility-administered medications on file prior to visit.     Review of Systems Per HPI unless specifically indicated in ROS section     Objective:    BP 124/72   Pulse 76   Temp 97.7 F (36.5 C) (Oral)   Wt 178 lb 8 oz (81 kg)   BMI 33.73 kg/m   Wt Readings from Last 3 Encounters:  02/08/16 178 lb 8 oz (81 kg)  01/11/16  178 lb 8 oz (81 kg)  12/22/15 180 lb 9.6 oz (81.9 kg)    Physical Exam  Constitutional: She appears well-developed and well-nourished. No distress.  HENT:  Head: Normocephalic and atraumatic.  Right Ear: Hearing, tympanic membrane, external ear and ear canal normal.  Left Ear: Hearing, tympanic membrane, external ear and ear canal normal.  Nose: Nose normal. No mucosal edema or rhinorrhea. Right sinus exhibits no maxillary sinus tenderness and no frontal sinus tenderness. Left sinus exhibits no maxillary sinus tenderness and no frontal sinus tenderness.  Mouth/Throat: Uvula is midline, oropharynx is clear and moist and mucous membranes are normal. No oropharyngeal exudate, posterior oropharyngeal edema, posterior oropharyngeal  erythema or tonsillar abscesses.  Eyes: Conjunctivae and EOM are normal. Pupils are equal, round, and reactive to light. No scleral icterus.  Neck: Normal range of motion. Neck supple.  Cardiovascular: Normal rate, regular rhythm, normal heart sounds and intact distal pulses.   No murmur heard. Pulmonary/Chest: Effort normal and breath sounds normal. No respiratory distress. She has no wheezes. She has no rales.  Abdominal: Soft. Bowel sounds are normal. She exhibits no distension and no mass. There is no tenderness. There is no rebound and no guarding.  Musculoskeletal: She exhibits no edema.  See HPI for foot exam if done  Lymphadenopathy:    She has no cervical adenopathy.  Skin: Skin is warm and dry. No rash noted.  Psychiatric: She has a normal mood and affect.  Nursing note and vitals reviewed.  Results for orders placed or performed in visit on 02/08/16  POC Influenza A&B(BINAX/QUICKVUE)  Result Value Ref Range   Influenza A, POC Negative Negative   Influenza B, POC Negative Negative      Assessment & Plan:   Problem List Items Addressed This Visit    Dyslipidemia    Update FLP on simvastatin 40mg  daily. Pt endorses fasting state.      Relevant Orders   Lipid panel   Fever - Primary    Flu swab negative Unspecified febrile illness. Given short duration anticipate self limited viral process, possible viral gastroenteritis given sick contact exposure.  Discussed supportive treatment plan - push fluids, rest, ok to continue tylenol - and red flags to seek further care also reviewed.       Relevant Orders   POC Influenza A&B(BINAX/QUICKVUE) (Completed)   Type 2 diabetes, uncontrolled, with neuropathy ([place])    Update A1c. Pt reports compliance with antihyperglycemic regimen.       Relevant Orders   Basic metabolic panel   Hemoglobin A1c    Other Visit Diagnoses   None.      Follow up plan: Return in about 3 months (around 05/09/2016), or if symptoms worsen or  fail to improve, for follow up visit.  [name], MD

## 2016-02-08 NOTE — Assessment & Plan Note (Signed)
Update FLP on simvastatin 40mg  daily. Pt endorses fasting state.

## 2016-02-12 ENCOUNTER — Encounter: Payer: Self-pay | Admitting: Family Medicine

## 2016-02-12 ENCOUNTER — Encounter: Payer: Self-pay | Admitting: *Deleted

## 2016-02-23 ENCOUNTER — Encounter: Payer: Self-pay | Admitting: Family Medicine

## 2016-02-24 MED ORDER — INSULIN DETEMIR 100 UNIT/ML FLEXPEN: 60.0000 [IU] | PEN_INJECTOR | Freq: Two times a day (BID) | SUBCUTANEOUS | 11 refills | Status: DC

## 2016-02-24 MED ORDER — EMPAGLIFLOZIN 10 MG PO TABS: 10.0000 mg | ORAL_TABLET | Freq: Every day | ORAL | 3 refills | Status: DC

## 2016-02-24 NOTE — Telephone Encounter (Signed)
Lab Results  Component Value Date   HGBA1C 10.6 (H) 02/08/2016

## 2016-03-02 ENCOUNTER — Other Ambulatory Visit: Payer: Self-pay | Admitting: *Deleted

## 2016-03-02 DIAGNOSIS — I10 Essential (primary) hypertension: Secondary | ICD-10-CM

## 2016-03-02 MED ORDER — CITALOPRAM HYDROBROMIDE 20 MG PO TABS: 20.0000 mg | ORAL_TABLET | Freq: Every day | ORAL | 3 refills | Status: DC

## 2016-03-02 MED ORDER — KLOR-CON SPRINKLE 10 MEQ PO CPCR: 10.0000 meq | ORAL_CAPSULE | Freq: Every day | ORAL | 1 refills | Status: DC | PRN

## 2016-03-02 MED ORDER — FUROSEMIDE 20 MG PO TABS: 20.0000 mg | ORAL_TABLET | Freq: Every day | ORAL | 1 refills | Status: DC | PRN

## 2016-03-02 MED ORDER — PANTOPRAZOLE SODIUM 40 MG PO TBEC: 40.0000 mg | DELAYED_RELEASE_TABLET | Freq: Every day | ORAL | 3 refills | Status: DC

## 2016-03-14 ENCOUNTER — Encounter: Payer: Self-pay | Admitting: Family Medicine

## 2016-03-15 ENCOUNTER — Ambulatory Visit: Payer: BLUE CROSS/BLUE SHIELD | Admitting: Family Medicine

## 2016-03-16 ENCOUNTER — Encounter: Payer: Self-pay | Admitting: Family Medicine

## 2016-03-16 NOTE — Telephone Encounter (Signed)
Please see Mychart message from patient.  

## 2016-03-24 ENCOUNTER — Encounter: Payer: Self-pay | Admitting: Family Medicine

## 2016-03-24 ENCOUNTER — Ambulatory Visit (INDEPENDENT_AMBULATORY_CARE_PROVIDER_SITE_OTHER): Payer: BLUE CROSS/BLUE SHIELD | Admitting: Family Medicine

## 2016-03-24 VITALS — BP 138/78 | HR 81 | Temp 98.0°F | Wt 183.5 lb

## 2016-03-24 DIAGNOSIS — E1140 Type 2 diabetes mellitus with diabetic neuropathy, unspecified: Secondary | ICD-10-CM

## 2016-03-24 DIAGNOSIS — R52 Pain, unspecified: Secondary | ICD-10-CM

## 2016-03-24 DIAGNOSIS — Z23 Encounter for immunization: Secondary | ICD-10-CM

## 2016-03-24 DIAGNOSIS — E785 Hyperlipidemia, unspecified: Secondary | ICD-10-CM | POA: Diagnosis not present

## 2016-03-24 DIAGNOSIS — R200 Anesthesia of skin: Secondary | ICD-10-CM | POA: Insufficient documentation

## 2016-03-24 DIAGNOSIS — R202 Paresthesia of skin: Secondary | ICD-10-CM

## 2016-03-24 DIAGNOSIS — E1165 Type 2 diabetes mellitus with hyperglycemia: Secondary | ICD-10-CM

## 2016-03-24 DIAGNOSIS — IMO0002 Reserved for concepts with insufficient information to code with codable children: Secondary | ICD-10-CM

## 2016-03-24 HISTORY — DX: Pain, unspecified: R52

## 2016-03-24 MED ORDER — ROSUVASTATIN CALCIUM 10 MG PO TABS: 10.0000 mg | ORAL_TABLET | Freq: Every day | ORAL | 3 refills | Status: DC

## 2016-03-24 NOTE — Assessment & Plan Note (Signed)
Reviewed with patient. Poor control despite simvastatin compliance, now with body aches possibly related to statin. Will change to crestor 10mg  daily.

## 2016-03-24 NOTE — Patient Instructions (Addendum)
Flu shot today Stop simvastatin. Start crestor 10mg  daily. See if body aches improve with this.  For right arm numbness -sounds like pinching coming from the cervical spine. Use heating pad to neck.  Return as needed or in 3 months for follow up

## 2016-03-24 NOTE — Assessment & Plan Note (Addendum)
Endorsing longstanding generalized body aches. Possibly related to simvastatin - will change statin to a less lipophilic statin like crestor.

## 2016-03-24 NOTE — Progress Notes (Signed)
Pre visit review using our clinic review tool, if applicable. No additional management support is needed unless otherwise documented below in the visit note. 

## 2016-03-24 NOTE — Progress Notes (Signed)
BP 138/78   Pulse 81   Temp 98 F (36.7 C) (Oral)   Wt 183 lb 8 oz (83.2 kg)   SpO2 95%   BMI 34.67 kg/m    CC: generalized body aches Subjective:    Patient ID: Karen [name], female    DOB: 1955/12/16, 60 y.o.   MRN: [id number]  HPI: Karen [name] is a 60 y.o. female presenting on 03/24/2016 for Generalized Body Aches   Reports compliance with lantus 60u bid and metformin 1000mg  bid. Regardless, latest A1c 10.6%. I sent in jardiance for patient to price out - she started taking this and has tolerated well, however 05/2016 insurance may change.   Severe hypertriglyceridemia despite simvastatin 40mg  daily.   Generalized body aches. Prior on lipitor, this was switched to simvastatin.   Ongoing R arm numbness - from shoulder down to hand. H/o R RTC repair.  No significant neck pain. Denies weakness or shooting pain down arms.   Relevant past medical, surgical, family and social history reviewed and updated as indicated. Interim medical history since our last visit reviewed. Allergies and medications reviewed and updated. Current Outpatient Prescriptions on File Prior to Visit  Medication Sig  . acetaminophen (TYLENOL) 500 MG tablet Take 1,000 mg by mouth every 6 (six) hours as needed (pain).   Marland Kitchen aspirin EC 81 MG EC tablet Take 1 tablet (81 mg total) by mouth daily.  . citalopram (CELEXA) 20 MG tablet Take 1 tablet (20 mg total) by mouth daily.  . empagliflozin (JARDIANCE) 10 MG TABS tablet Take 10 mg by mouth daily.  . Insulin Glargine (LANTUS SOLOSTAR) 100 UNIT/ML Solostar Pen Inject 60 Units into the skin 2 (two) times daily. QS 1 mo  . Insulin Pen Needle (PEN NEEDLES) 31G X 8 MM MISC Use to inject Lantus as directed twice daily. Dx: E11.40  . Loperamide HCl (IMODIUM A-D PO) Take 1-2 tablets by mouth daily as needed (for stomach).   . metFORMIN (GLUCOPHAGE) 1000 MG tablet Take 1 tablet (1,000 mg total) by mouth 2 (two) times daily with a meal.  . nitroGLYCERIN (NITROSTAT) 0.4 MG  SL tablet Place 1 tablet (0.4 mg total) under the tongue every 5 (five) minutes as needed for chest pain.  . pantoprazole (PROTONIX) 40 MG tablet Take 1 tablet (40 mg total) by mouth daily.  . promethazine (PHENERGAN) 25 MG tablet Take 1 tablet by mouth every 8 (eight) hours as needed for nausea.   . furosemide (LASIX) 20 MG tablet Take 1 tablet (20 mg total) by mouth daily as needed for edema. (Patient not taking: Reported on 03/24/2016)  . Insulin Detemir (LEVEMIR FLEXPEN) 100 UNIT/ML Pen Inject 60 Units into the skin 2 (two) times daily. QS 1 month (Patient not taking: Reported on 03/24/2016)  . KLOR-CON SPRINKLE 10 MEQ CR capsule Take 1 capsule (10 mEq total) by mouth daily as needed. When taking Lasix (Patient not taking: Reported on 03/24/2016)  . losartan (COZAAR) 25 MG tablet Take 1 tablet (25 mg total) by mouth daily.   No current facility-administered medications on file prior to visit.     Review of Systems Per HPI unless specifically indicated in ROS section     Objective:    BP 138/78   Pulse 81   Temp 98 F (36.7 C) (Oral)   Wt 183 lb 8 oz (83.2 kg)   SpO2 95%   BMI 34.67 kg/m   Wt Readings from Last 3 Encounters:  03/24/16 183 lb 8 oz (  83.2 kg)  02/08/16 178 lb 8 oz (81 kg)  01/11/16 178 lb 8 oz (81 kg)    Physical Exam  Constitutional: She appears well-developed and well-nourished. No distress.  HENT:  Mouth/Throat: Oropharynx is clear and moist. No oropharyngeal exudate.  Eyes: Conjunctivae and EOM are normal. Pupils are equal, round, and reactive to light.  Cardiovascular: Normal rate, regular rhythm, normal heart sounds and intact distal pulses.   No murmur heard. Pulmonary/Chest: Effort normal and breath sounds normal. No respiratory distress. She has no wheezes. She has no rales.  Musculoskeletal: She exhibits no edema.  FROM at neck No pain to palpation midline cervical spine  Discomfort to palpation R paracervical muscles  Neurological:  Neg tinel and  phalen 5/5 strength BUE Sensation intact to temperature and overall to light touch  Skin: Skin is warm and dry. No rash noted.  Psychiatric: She has a normal mood and affect.  Nursing note and vitals reviewed.  Results for orders placed or performed in visit on [id number]  Basic metabolic panel  Result Value Ref Range   Sodium 137 135 - 145 mEq/L   Potassium 4.5 3.5 - 5.1 mEq/L   Chloride 102 96 - 112 mEq/L   CO2 28 19 - 32 mEq/L   Glucose, Bld 207 (H) 70 - 99 mg/dL   BUN 9 6 - 23 mg/dL   Creatinine, Ser 0.59 0.40 - 1.20 mg/dL   Calcium 8.9 8.4 - 10.5 mg/dL   GFR 111.09 >60.00 mL/min  Hemoglobin A1c  Result Value Ref Range   Hgb A1c MFr Bld 10.6 (H) 4.6 - 6.5 %  Lipid panel  Result Value Ref Range   Cholesterol 210 (H) 0 - 200 mg/dL   Triglycerides (H) 0.0 - 149.0 mg/dL    560.0 Triglyceride is over 400; calculations on Lipids are invalid.   HDL 36.40 (L) >39.00 mg/dL   Total CHOL/HDL Ratio 6   LDL cholesterol, direct  Result Value Ref Range   Direct LDL 88.0 mg/dL  POC Influenza A&B(BINAX/QUICKVUE)  Result Value Ref Range   Influenza A, POC Negative Negative   Influenza B, POC Negative Negative      Assessment & Plan:   Problem List Items Addressed This Visit    Body aches - Primary    Endorsing longstanding generalized body aches. Possibly related to simvastatin - will change statin to a less lipophilic statin like crestor.      Dyslipidemia    Reviewed with patient. Poor control despite simvastatin compliance, now with body aches possibly related to statin. Will change to crestor 10mg  daily.       Relevant Medications   rosuvastatin (CRESTOR) 10 MG tablet   Right arm numbness    Anticipate cervical etiology. Not consistent with CTS. Given strength intact and no pain, rec monitor for now. Rec heating pad, tylenol.       Type 2 diabetes, uncontrolled, with neuropathy ([place])    Pt reports compliance with lantus, metformin, and now jardiance. Able to afford this  medication through the new year, then will reassess cost. Planning to switch lantus to levemir in the new year. RTC 3 mo f/u visit.       Relevant Medications   rosuvastatin (CRESTOR) 10 MG tablet    Other Visit Diagnoses    Need for influenza vaccination       Relevant Orders   Flu Vaccine QUAD 36+ mos PF IM (Fluarix & Fluzone Quad PF) (Completed)       Follow  up plan: Return in about 3 months (around 06/24/2016) for follow up visit.  [name], MD

## 2016-03-24 NOTE — Assessment & Plan Note (Addendum)
Anticipate cervical etiology. Not consistent with CTS. Given strength intact and no pain, rec monitor for now. Rec heating pad, tylenol.

## 2016-03-24 NOTE — Assessment & Plan Note (Addendum)
Pt reports compliance with lantus, metformin, and now jardiance. Able to afford this medication through the new year, then will reassess cost. Planning to switch lantus to levemir in the new year. RTC 3 mo f/u visit.

## 2016-05-02 ENCOUNTER — Other Ambulatory Visit: Payer: Self-pay | Admitting: *Deleted

## 2016-05-02 DIAGNOSIS — I10 Essential (primary) hypertension: Secondary | ICD-10-CM

## 2016-05-02 MED ORDER — KLOR-CON SPRINKLE 10 MEQ PO CPCR: 10.0000 meq | ORAL_CAPSULE | Freq: Every day | ORAL | 3 refills | Status: DC | PRN

## 2016-05-02 MED ORDER — FUROSEMIDE 20 MG PO TABS: 20.0000 mg | ORAL_TABLET | Freq: Every day | ORAL | 3 refills | Status: DC | PRN

## 2016-05-18 ENCOUNTER — Emergency Department (HOSPITAL_COMMUNITY)
Admission: EM | Admit: 2016-05-18 | Discharge: 2016-05-18 | Disposition: A | Discharge status: Home / Self Care | Payer: BLUE CROSS/BLUE SHIELD | Attending: Emergency Medicine | Admitting: Emergency Medicine

## 2016-05-18 ENCOUNTER — Encounter (HOSPITAL_COMMUNITY): Payer: Self-pay | Admitting: Emergency Medicine

## 2016-05-18 DIAGNOSIS — R197 Diarrhea, unspecified: Secondary | ICD-10-CM | POA: Diagnosis not present

## 2016-05-18 DIAGNOSIS — Z794 Long term (current) use of insulin: Secondary | ICD-10-CM | POA: Insufficient documentation

## 2016-05-18 DIAGNOSIS — R112 Nausea with vomiting, unspecified: Secondary | ICD-10-CM | POA: Diagnosis not present

## 2016-05-18 DIAGNOSIS — Z7982 Long term (current) use of aspirin: Secondary | ICD-10-CM | POA: Diagnosis not present

## 2016-05-18 DIAGNOSIS — I2510 Atherosclerotic heart disease of native coronary artery without angina pectoris: Secondary | ICD-10-CM | POA: Diagnosis not present

## 2016-05-18 DIAGNOSIS — I10 Essential (primary) hypertension: Secondary | ICD-10-CM | POA: Diagnosis not present

## 2016-05-18 DIAGNOSIS — I252 Old myocardial infarction: Secondary | ICD-10-CM | POA: Diagnosis not present

## 2016-05-18 DIAGNOSIS — E1140 Type 2 diabetes mellitus with diabetic neuropathy, unspecified: Secondary | ICD-10-CM | POA: Diagnosis not present

## 2016-05-18 LAB — COMPREHENSIVE METABOLIC PANEL
ALBUMIN: 4.7 g/dL (ref 3.5–5.0)
ALT: 28 U/L (ref 14–54)
AST: 32 U/L (ref 15–41)
Alkaline Phosphatase: 70 U/L (ref 38–126)
Anion gap: 15 (ref 5–15)
BUN: 20 mg/dL (ref 6–20)
CO2: 20 mmol/L — AB (ref 22–32)
CREATININE: 0.79 mg/dL (ref 0.44–1.00)
Calcium: 10 mg/dL (ref 8.9–10.3)
Chloride: 104 mmol/L (ref 101–111)
GFR calc non Af Amer: >60 mL/min (ref >60)
Glucose, Bld: 335 mg/dL — ABNORMAL HIGH (ref 65–99)
Potassium: 4.4 mmol/L (ref 3.5–5.1)
SODIUM: 139 mmol/L (ref 135–145)
Total Bilirubin: 1.1 mg/dL (ref 0.3–1.2)
Total Protein: 7.9 g/dL (ref 6.5–8.1)

## 2016-05-18 LAB — URINALYSIS, ROUTINE W REFLEX MICROSCOPIC
Bilirubin Urine: NEGATIVE
Ketones, ur: 20 mg/dL — AB
Leukocytes, UA: NEGATIVE
Nitrite: NEGATIVE
PROTEIN: 30 mg/dL — AB
Specific Gravity, Urine: 1.035 — ABNORMAL HIGH (ref 1.005–1.030)
pH: 5 (ref 5.0–8.0)

## 2016-05-18 LAB — LIPASE, BLOOD: LIPASE: 53 U/L — AB (ref 11–51)

## 2016-05-18 LAB — CBC
HCT: 48.3 % — ABNORMAL HIGH (ref 36.0–46.0)
Hemoglobin: 16.9 g/dL — ABNORMAL HIGH (ref 12.0–15.0)
MCH: 28.9 pg (ref 26.0–34.0)
MCHC: 35 g/dL (ref 30.0–36.0)
MCV: 82.6 fL (ref 78.0–100.0)
PLATELETS: 181 10*3/uL (ref 150–400)
RBC: 5.85 MIL/uL — AB (ref 3.87–5.11)
RDW: 13.4 % (ref 11.5–15.5)
WBC: 13.1 10*3/uL — ABNORMAL HIGH (ref 4.0–10.5)

## 2016-05-18 MED ORDER — ONDANSETRON 8 MG PO TBDP: 8.0000 mg | ORAL_TABLET | Freq: Three times a day (TID) | ORAL | 0 refills | Status: DC | PRN

## 2016-05-18 MED ORDER — ONDANSETRON 4 MG PO TBDP: 4.0000 mg | ORAL_TABLET | Freq: Once | ORAL | Status: DC | PRN

## 2016-05-18 MED ORDER — ONDANSETRON HCL 4 MG/2ML IJ SOLN
4.0000 mg | Freq: Once | INTRAMUSCULAR | Status: AC
  Administered 2016-05-18: 4 mg via INTRAVENOUS
  Filled 2016-05-18: qty 2

## 2016-05-18 MED ORDER — SODIUM CHLORIDE 0.9 % IV SOLN
1000.0000 mL | INTRAVENOUS | Status: DC
  Administered 2016-05-18: 1000 mL via INTRAVENOUS

## 2016-05-18 MED ORDER — SODIUM CHLORIDE 0.9 % IV SOLN
1000.0000 mL | Freq: Once | INTRAVENOUS | Status: AC
  Administered 2016-05-18: 1000 mL via INTRAVENOUS

## 2016-05-18 MED ORDER — LORAZEPAM 2 MG/ML IJ SOLN
0.5000 mg | Freq: Once | INTRAMUSCULAR | Status: AC
  Administered 2016-05-18: 0.5 mg via INTRAVENOUS
  Filled 2016-05-18: qty 1

## 2016-05-18 NOTE — ED Notes (Signed)
Pt given a new emesis bag and triage chair leaned back.  Pt continues to refuse Zofran.

## 2016-05-18 NOTE — ED Provider Notes (Signed)
[facility] DEPT Provider Note   CSN: [id number] Arrival date & time: 05/18/16  1325     History   Chief Complaint Chief Complaint  Patient presents with  . Emesis    HPI Karen [name] is a 61 y.o. female.  HPI Patient presents to the emergency department with nausea vomiting and diarrhea since today.  She denies abdominal pain.  No hematemesis.  No melena or hematochezia.  No recent sick contacts.  No other complaints at this time.  Symptoms are moderate in severity.  No dysuria   Past Medical History:  Diagnosis Date  . CAD (coronary artery disease)    STEMI 07/2013 - treated with PTCA of the RCA followed by DES to the intermediate ramus  //  Myoview 5/17 - Normal perfusion. LVEF 71% with normal wall motion. This is a low risk study.  . Chronic bronchitis   . Depression   . GERD (gastroesophageal reflux disease)   . High blood pressure   . History of chicken pox   . History of ovarian cyst   . HLD (hyperlipidemia)   . Seasonal allergies   . Seizure ([place]) 1977   off AEDs  . SVT (supraventricular tachycardia) ([place]) 10/2015   s/p catheter ablation  . Syncopal episodes   . Tachyarrhythmia    ER visits with adenosine push to control  . Tubal pregnancy   . Type 2 diabetes, uncontrolled, with neuropathy ([place])    dx 1993    Patient Active Problem List   Diagnosis Date Noted  . Body aches 03/24/2016  . Right arm numbness 03/24/2016  . BPPV (benign paroxysmal positional vertigo) 01/11/2016  . Cough due to ACE inhibitor 12/22/2015  . SVT (supraventricular tachycardia) ([place]) 11/06/2015  . Paroxysmal SVT (supraventricular tachycardia) ([place]) 09/15/2015  . Pain, dental 05/14/2014  . CMC arthritis, thumb, degenerative 02/21/2014  . Sprain of left thumb 02/06/2014  . Diabetic retinopathy ([place]) 12/31/2013  . Dyspnea 10/01/2013  . CAD S/P percutaneous coronary angioplasty 07/23/2013  . Health maintenance examination 06/10/2013  . Vertigo 04/25/2013  . Tooth pain  08/22/2012  . Lumbar radiculopathy 03/16/2011  . Depression   . Type 2 diabetes, uncontrolled, with neuropathy ([place])   . GERD (gastroesophageal reflux disease)   . Seasonal allergies   . HTN (hypertension)   . Dyslipidemia     Past Surgical History:  Procedure Laterality Date  . ABLATION OF DYSRHYTHMIC FOCUS  11/06/2015   SVT  . CARDIAC CATHETERIZATION  07/2013   angioplasty to RCA and DES to ramus intermedius  . DILATION AND CURETTAGE OF UTERUS  1984  . ECTOPIC PREGNANCY SURGERY Right 1984   tube removal 2/2 hemorrhage  . ELECTROPHYSIOLOGIC STUDY N/A 11/06/2015   SVT Ablation;  Evans Lance, MD;  Location: [facility] CV LAB  . LEFT HEART CATHETERIZATION WITH CORONARY ANGIOGRAM N/A 07/23/2013   Procedure: LEFT HEART CATHETERIZATION WITH CORONARY ANGIOGRAM;  Surgeon: Sinclair Grooms, MD;  Location[facility] C[facility] LAB;  Service: Cardiovascular;  Laterality: N/A;  . OVARIAN CYST SURGERY  1998  . PERCUTANEOUS CORONARY STENT INTERVENTION (PCI-S) N/A 07/24/2013   Procedure: PERCUTANEOUS CORONARY STENT INTERVENTION (PCI-S);  Surgeon: Sinclair Grooms, MD;  Location: [facility] CATH LAB;  Service: Cardiovascular;  Laterality: N/A;  . ROTATOR CUFF REPAIR  2006   Right  . SUBMANDIBULAR DUCT LIGATION Right 10/2014   R submandibular gland stone removed [facility])  . TONSILLECTOMY AND ADENOIDECTOMY  1965    OB History    No data available  Home Medications    Prior to Admission medications   Medication Sig Start Date End Date Taking? Authorizing Provider  acetaminophen (TYLENOL) 500 MG tablet Take 1,000 mg by mouth 2 (two) times daily.    Yes Historical Provider, MD  aspirin EC 81 MG EC tablet Take 1 tablet (81 mg total) by mouth daily. 07/25/13  Yes Sueanne Margarita, MD  citalopram (CELEXA) 20 MG tablet Take 1 tablet (20 mg total) by mouth daily. 03/02/16  Yes [name], MD  empagliflozin (JARDIANCE) 10 MG TABS tablet Take 10 mg by mouth daily. 02/24/16  Yes [name], MD    furosemide (LASIX) 20 MG tablet Take 1 tablet (20 mg total) by mouth daily as needed for edema. 05/02/16  Yes [name], MD  gabapentin (NEURONTIN) 100 MG capsule Take 200 mg by mouth 2 (two) times daily.  03/24/16  Yes [name], MD  Insulin Detemir (LEVEMIR FLEXPEN) 100 UNIT/ML Pen Inject 60 Units into the skin 2 (two) times daily. QS 1 month 02/24/16  Yes [name], MD  Insulin Pen Needle (PEN NEEDLES) 31G X 8 MM MISC Use to inject Lantus as directed twice daily. Dx: E11.40 08/18/15  Yes [name], MD  KLOR-CON SPRINKLE 10 MEQ CR capsule Take 1 capsule (10 mEq total) by mouth daily as needed. When taking Lasix 05/02/16  Yes [name], MD  Loperamide HCl (IMODIUM A-D PO) Take 1-2 tablets by mouth daily as needed (for stomach).    Yes Historical Provider, MD  losartan (COZAAR) 25 MG tablet Take 1 tablet (25 mg total) by mouth daily. 12/22/15 05/18/16 Yes Belva Crome, MD  metFORMIN (GLUCOPHAGE) 1000 MG tablet Take 1 tablet (1,000 mg total) by mouth 2 (two) times daily with a meal. 07/16/15  Yes [name], MD  nitroGLYCERIN (NITROSTAT) 0.4 MG SL tablet Place 1 tablet (0.4 mg total) under the tongue every 5 (five) minutes as needed for chest pain. 07/02/15  Yes [name], MD  pantoprazole (PROTONIX) 40 MG tablet Take 1 tablet (40 mg total) by mouth daily. 03/02/16  Yes [name], MD  promethazine (PHENERGAN) 25 MG tablet Take 1 tablet by mouth every 8 (eight) hours as needed for nausea.  09/03/15  Yes Historical Provider, MD  rosuvastatin (CRESTOR) 10 MG tablet Take 1 tablet (10 mg total) by mouth daily. 03/24/16  Yes [name], MD  Insulin Glargine (LANTUS SOLOSTAR) 100 UNIT/ML Solostar Pen Inject 60 Units into the skin 2 (two) times daily. QS 1 mo Patient not taking: Reported on 05/18/2016 07/16/15   [name], MD  ondansetron (ZOFRAN ODT) 8 MG disintegrating tablet Take 1 tablet (8 mg total) by mouth every 8 (eight) hours as needed for nausea  or vomiting. 05/18/16   Jola Schmidt, MD    Family History Family History  Problem Relation Age of Onset  . Diabetes Mother   . Hyperlipidemia Mother   . Hypertension Mother   . Stroke Mother   . Coronary artery disease Mother     CABG  . Heart disease Mother   . Diabetes Father   . Hypertension Father   . Hyperlipidemia Father   . Coronary artery disease Father     CABG  . Congestive Heart Failure Father   . Pancreatic cancer Father   . Arrhythmia Brother   . Diabetes Maternal Grandmother   . Diabetes Maternal Grandfather   . Diabetes Paternal Grandmother   . Diabetes Paternal Grandfather   . Heart disease Brother   . Kidney  disease Neg Hx     Social History Social History  Substance Use Topics  . Smoking status: Never Smoker  . Smokeless tobacco: Never Used  . Alcohol use No     Allergies   Clindamycin/lincomycin; Erythromycin; Lipitor [atorvastatin]; and Penicillins   Review of Systems Review of Systems  All other systems reviewed and are negative.    Physical Exam Updated Vital Signs BP 135/73   Pulse 110   Temp 97.6 F (36.4 C) (Axillary)   Resp 15   Ht 5\' 1"  (1.549 m)   Wt 182 lb (82.6 kg)   SpO2 100%   BMI 34.39 kg/m   Physical Exam  Constitutional: She is oriented to person, place, and time. She appears well-developed and well-nourished. No distress.  HENT:  Head: Normocephalic and atraumatic.  Eyes: EOM are normal.  Neck: Normal range of motion.  Cardiovascular: Normal rate, regular rhythm and normal heart sounds.   Pulmonary/Chest: Effort normal and breath sounds normal.  Abdominal: Soft. She exhibits no distension. There is no tenderness.  Musculoskeletal: Normal range of motion.  Neurological: She is alert and oriented to person, place, and time.  Skin: Skin is warm and dry.  Psychiatric: She has a normal mood and affect. Judgment normal.  Nursing note and vitals reviewed.    ED Treatments / Results  Labs (all labs ordered are  listed, but only abnormal results are displayed) Labs Reviewed  LIPASE, BLOOD - Abnormal; Notable for the following:       Result Value   Lipase 53 (*)    All other components within normal limits  COMPREHENSIVE METABOLIC PANEL - Abnormal; Notable for the following:    CO2 20 (*)    Glucose, Bld 335 (*)    All other components within normal limits  CBC - Abnormal; Notable for the following:    WBC 13.1 (*)    RBC 5.85 (*)    Hemoglobin 16.9 (*)    HCT 48.3 (*)    All other components within normal limits  URINALYSIS, ROUTINE W REFLEX MICROSCOPIC - Abnormal; Notable for the following:    Specific Gravity, Urine 1.035 (*)    Glucose, UA >=500 (*)    Hgb urine dipstick SMALL (*)    Ketones, ur 20 (*)    Protein, ur 30 (*)    Bacteria, UA RARE (*)    Squamous Epithelial / LPF 0-5 (*)    All other components within normal limits  CBG MONITORING, ED    EKG  EKG Interpretation None       Radiology No results found.  Procedures Procedures (including critical care time)  Medications Ordered in ED Medications  ondansetron (ZOFRAN-ODT) disintegrating tablet 4 mg (4 mg Oral Refused 05/18/16 1351)  0.9 %  sodium chloride infusion (0 mLs Intravenous Stopped 05/18/16 1759)    Followed by  0.9 %  sodium chloride infusion (0 mLs Intravenous Stopped 05/18/16 1759)  ondansetron (ZOFRAN) injection 4 mg (4 mg Intravenous Given 05/18/16 1705)  LORazepam (ATIVAN) injection 0.5 mg (0.5 mg Intravenous Given 05/18/16 1706)     Initial Impression / Assessment and Plan / ED Course  I have reviewed the triage vital signs and the nursing notes.  Pertinent labs & imaging results that were available during my care of the patient were reviewed by me and considered in my medical decision making (see chart for details).  Clinical Course     Feels much better this time.  Discharge home with Zofran.  Likely viral  gastroenteritis.  Did not take her diabetic medications today secondary to vomiting.  No  levator anion gap.  Primary care follow-up.  She understands to return to the ER for new or worsening symptoms  Final Clinical Impressions(s) / ED Diagnoses   Final diagnoses:  Nausea vomiting and diarrhea    New Prescriptions New Prescriptions   ONDANSETRON (ZOFRAN ODT) 8 MG DISINTEGRATING TABLET    Take 1 tablet (8 mg total) by mouth every 8 (eight) hours as needed for nausea or vomiting.     Jola Schmidt, MD 05/18/16 [phone]

## 2016-05-18 NOTE — ED Notes (Signed)
Tolerating po fluids

## 2016-05-18 NOTE — ED Notes (Addendum)
Pt continues to dry heave and continues to refuse ODT Zofran.

## 2016-05-18 NOTE — ED Triage Notes (Addendum)
Per EMS, patient from home c/o N/V since this morning. Patient reports body aches and chills. Denies diarrhea. A&Ox4. Ambulatory with EMS.

## 2016-05-19 LAB — CBG MONITORING, ED: GLUCOSE-CAPILLARY: 293 mg/dL — AB (ref 65–99)

## 2016-05-31 ENCOUNTER — Other Ambulatory Visit: Payer: Self-pay | Admitting: *Deleted

## 2016-05-31 MED ORDER — EMPAGLIFLOZIN 10 MG PO TABS: 10.0000 mg | ORAL_TABLET | Freq: Every day | ORAL | 3 refills | Status: DC

## 2016-07-27 ENCOUNTER — Other Ambulatory Visit: Payer: Self-pay | Admitting: *Deleted

## 2016-07-27 MED ORDER — ROSUVASTATIN CALCIUM 10 MG PO TABS: 10.0000 mg | ORAL_TABLET | Freq: Every day | ORAL | 3 refills | Status: DC

## 2016-08-15 ENCOUNTER — Ambulatory Visit: Payer: BLUE CROSS/BLUE SHIELD | Admitting: Family Medicine

## 2016-08-16 ENCOUNTER — Encounter: Payer: Self-pay | Admitting: Family Medicine

## 2016-08-19 NOTE — Telephone Encounter (Signed)
Pt left v/m about the info left in email and requesting cb [phone] left v/m requesting pt to cb.

## 2016-08-19 NOTE — Telephone Encounter (Signed)
Pt called back and motrin was not helping so motrin was D/C and pt given naproxen 375 mg taking 1 pill bid. Pt wants to know if can take tylenol with that med. Pt said naproxen is not helping pain at all. Pt wants to know if Dr Darnell Level can give stronger pain med for lt shoulder pain; I advised since workers comp was not sure Dr Darnell Level could treat pain but I would send message. Pt also wants to know if and when to restart ASA. [place]. Pt request cb at [phone] and if no answer can leave detailed message.

## 2016-08-25 ENCOUNTER — Encounter: Payer: BLUE CROSS/BLUE SHIELD | Admitting: Family Medicine

## 2016-08-25 ENCOUNTER — Encounter: Payer: Self-pay | Admitting: Family Medicine

## 2016-09-05 ENCOUNTER — Encounter: Payer: Self-pay | Admitting: Family Medicine

## 2016-09-06 ENCOUNTER — Telehealth: Payer: Self-pay | Admitting: *Deleted

## 2016-09-06 NOTE — Telephone Encounter (Signed)
Patient notified via [place]. Waiting to hear if she wants it mailed to her or if she wants to pick it up.

## 2016-09-06 NOTE — Telephone Encounter (Signed)
Signed and in Kim's box. 

## 2016-09-06 NOTE — Telephone Encounter (Signed)
Paperwork at front desk for pick up 

## 2016-09-06 NOTE — Telephone Encounter (Signed)
Patient requests assistance for medication through [employer] again for insulin due to not being able to afford insurance. She is aware she will have to switch to Lantus from Levemir. Forms in your IN box for completion. Please return to me. Thanks

## 2016-09-09 ENCOUNTER — Encounter: Payer: Self-pay | Admitting: Family Medicine

## 2016-09-15 ENCOUNTER — Encounter: Payer: Self-pay | Admitting: Family Medicine

## 2016-09-17 DIAGNOSIS — M75122 Complete rotator cuff tear or rupture of left shoulder, not specified as traumatic: Secondary | ICD-10-CM | POA: Insufficient documentation

## 2016-09-17 DIAGNOSIS — Z9889 Other specified postprocedural states: Secondary | ICD-10-CM | POA: Insufficient documentation

## 2016-12-10 ENCOUNTER — Other Ambulatory Visit: Payer: Self-pay | Admitting: Family Medicine

## 2016-12-12 NOTE — Telephone Encounter (Signed)
Med list indicating pt is taking crestor. pls advise

## 2016-12-13 ENCOUNTER — Other Ambulatory Visit: Payer: Self-pay | Admitting: *Deleted

## 2016-12-13 MED ORDER — ROSUVASTATIN CALCIUM 10 MG PO TABS
10.0000 mg | ORAL_TABLET | Freq: Every day | ORAL | 3 refills | Status: DC
Start: 2016-12-13 — End: 2017-05-17

## 2016-12-13 MED ORDER — METFORMIN HCL 1000 MG PO TABS: 1000.0000 mg | ORAL_TABLET | Freq: Two times a day (BID) | ORAL | 0 refills | Status: DC

## 2016-12-13 NOTE — Telephone Encounter (Signed)
Refilled crestor 

## 2016-12-14 ENCOUNTER — Other Ambulatory Visit: Payer: Self-pay | Admitting: Family Medicine

## 2017-01-17 ENCOUNTER — Other Ambulatory Visit: Payer: Self-pay | Admitting: Family Medicine

## 2017-01-18 ENCOUNTER — Encounter (HOSPITAL_COMMUNITY): Payer: Self-pay | Admitting: Emergency Medicine

## 2017-01-18 ENCOUNTER — Emergency Department (HOSPITAL_COMMUNITY): Payer: Self-pay

## 2017-01-18 DIAGNOSIS — M79601 Pain in right arm: Secondary | ICD-10-CM | POA: Insufficient documentation

## 2017-01-18 DIAGNOSIS — E119 Type 2 diabetes mellitus without complications: Secondary | ICD-10-CM | POA: Insufficient documentation

## 2017-01-18 DIAGNOSIS — Z79899 Other long term (current) drug therapy: Secondary | ICD-10-CM | POA: Insufficient documentation

## 2017-01-18 DIAGNOSIS — Y998 Other external cause status: Secondary | ICD-10-CM | POA: Insufficient documentation

## 2017-01-18 DIAGNOSIS — Z794 Long term (current) use of insulin: Secondary | ICD-10-CM | POA: Insufficient documentation

## 2017-01-18 DIAGNOSIS — Y929 Unspecified place or not applicable: Secondary | ICD-10-CM | POA: Insufficient documentation

## 2017-01-18 DIAGNOSIS — Y9389 Activity, other specified: Secondary | ICD-10-CM | POA: Insufficient documentation

## 2017-01-18 DIAGNOSIS — I10 Essential (primary) hypertension: Secondary | ICD-10-CM | POA: Insufficient documentation

## 2017-01-18 DIAGNOSIS — R0789 Other chest pain: Secondary | ICD-10-CM | POA: Insufficient documentation

## 2017-01-18 DIAGNOSIS — S0990XA Unspecified injury of head, initial encounter: Secondary | ICD-10-CM | POA: Insufficient documentation

## 2017-01-18 DIAGNOSIS — Z7982 Long term (current) use of aspirin: Secondary | ICD-10-CM | POA: Insufficient documentation

## 2017-01-18 DIAGNOSIS — F329 Major depressive disorder, single episode, unspecified: Secondary | ICD-10-CM | POA: Insufficient documentation

## 2017-01-18 DIAGNOSIS — I2510 Atherosclerotic heart disease of native coronary artery without angina pectoris: Secondary | ICD-10-CM | POA: Insufficient documentation
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
## 2017-01-19 ENCOUNTER — Telehealth: Payer: Self-pay | Admitting: Family Medicine

## 2017-01-19 ENCOUNTER — Emergency Department (HOSPITAL_COMMUNITY): Payer: Self-pay

## 2017-01-19 ENCOUNTER — Emergency Department (HOSPITAL_COMMUNITY)
Admission: EM | Admit: 2017-01-19 | Discharge: 2017-01-19 | Disposition: A | Discharge status: Home / Self Care | Payer: Self-pay | Attending: Emergency Medicine | Admitting: Emergency Medicine

## 2017-01-19 DIAGNOSIS — R0781 Pleurodynia: Secondary | ICD-10-CM

## 2017-01-19 DIAGNOSIS — S0990XA Unspecified injury of head, initial encounter: Secondary | ICD-10-CM

## 2017-01-19 DIAGNOSIS — M79601 Pain in right arm: Secondary | ICD-10-CM

## 2017-01-19 LAB — CBG MONITORING, ED: Glucose-Capillary: 176 mg/dL — ABNORMAL HIGH (ref 65–99)

## 2017-01-19 MED ORDER — BACITRACIN ZINC 500 UNIT/GM EX OINT
TOPICAL_OINTMENT | Freq: Two times a day (BID) | CUTANEOUS | Status: DC
  Administered 2017-01-19: 1 via TOPICAL
  Filled 2017-01-19: qty 0.9

## 2017-01-19 NOTE — Discharge Instructions (Signed)
All of your imaging has been normal. You have no broken bones or internal bleeding. He may continue taking the tramadol that you have a home for pain. Have placed with a wrist splint and a sling just for comfort. May take Motrin and Tylenol at home for pain and swelling. Apply ice to get plenty or rest with elevation. Have given you a referral to the orthopedic doctor if her symptoms are not improving. Makes you follow-up with her primary care doctor.

## 2017-01-19 NOTE — Telephone Encounter (Signed)
Pt returned your call, she is requesting a cb.

## 2017-01-19 NOTE — ED Notes (Signed)
Patient pulled another wheelchair up to rest her feet on. She appears comfortable and is making conversation with friend in wheelchair next to her. NAD noted. Updated patient on wait.

## 2017-01-19 NOTE — ED Notes (Signed)
Patient transported to CT. Patient to be taken to xray following CT.

## 2017-01-19 NOTE — ED Notes (Signed)
Ortho at bedside.
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
## 2017-01-19 NOTE — Progress Notes (Signed)
Orthopedic Tech Progress Note Patient Details:  Karen [name] 1955-10-05 [id number]  Ortho Devices Type of Ortho Device: Arm sling, Velcro wrist splint Ortho Device/Splint Location: rue Ortho Device/Splint Interventions: Application   Dyanna Seiter 01/19/2017, 9:22 AM
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
## 2017-01-19 NOTE — ED Notes (Signed)
Ortho notified of need for wrist splint and sling.

## 2017-01-19 NOTE — Telephone Encounter (Signed)
Lm on pts vm requesting a call back 

## 2017-01-20 NOTE — Telephone Encounter (Signed)
Spoke to pt and advised per Dr g.

## 2017-01-24 ENCOUNTER — Other Ambulatory Visit: Payer: Self-pay | Admitting: Family Medicine

## 2017-01-25 NOTE — Telephone Encounter (Signed)
Last OV 03/24/16 next OV none. How do you want refill done?

## 2017-02-07 ENCOUNTER — Encounter (HOSPITAL_COMMUNITY): Payer: Self-pay | Admitting: Neurology

## 2017-02-07 ENCOUNTER — Emergency Department (HOSPITAL_COMMUNITY)
Admission: EM | Admit: 2017-02-07 | Discharge: 2017-02-07 | Disposition: A | Discharge status: Home / Self Care | Payer: Self-pay | Attending: Emergency Medicine | Admitting: Emergency Medicine

## 2017-02-07 DIAGNOSIS — Z7982 Long term (current) use of aspirin: Secondary | ICD-10-CM | POA: Insufficient documentation

## 2017-02-07 DIAGNOSIS — E11319 Type 2 diabetes mellitus with unspecified diabetic retinopathy without macular edema: Secondary | ICD-10-CM | POA: Insufficient documentation

## 2017-02-07 DIAGNOSIS — I10 Essential (primary) hypertension: Secondary | ICD-10-CM | POA: Insufficient documentation

## 2017-02-07 DIAGNOSIS — S66911D Strain of unspecified muscle, fascia and tendon at wrist and hand level, right hand, subsequent encounter: Secondary | ICD-10-CM | POA: Insufficient documentation

## 2017-02-07 DIAGNOSIS — T148XXA Other injury of unspecified body region, initial encounter: Secondary | ICD-10-CM

## 2017-02-07 DIAGNOSIS — Z79899 Other long term (current) drug therapy: Secondary | ICD-10-CM | POA: Insufficient documentation

## 2017-02-07 DIAGNOSIS — S0083XD Contusion of other part of head, subsequent encounter: Secondary | ICD-10-CM | POA: Insufficient documentation

## 2017-02-07 DIAGNOSIS — I2510 Atherosclerotic heart disease of native coronary artery without angina pectoris: Secondary | ICD-10-CM | POA: Insufficient documentation

## 2017-02-07 DIAGNOSIS — Z794 Long term (current) use of insulin: Secondary | ICD-10-CM | POA: Insufficient documentation

## 2017-02-07 DIAGNOSIS — E1140 Type 2 diabetes mellitus with diabetic neuropathy, unspecified: Secondary | ICD-10-CM | POA: Insufficient documentation
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
## 2017-02-07 NOTE — Discharge Instructions (Signed)
Take 4 over the counter ibuprofen tablets 3 times a day or 2 over-the-counter naproxen tablets twice a day for pain. Also take tylenol 1000mg(2 extra strength) four times a day.    

## 2017-02-10 ENCOUNTER — Other Ambulatory Visit: Payer: Self-pay

## 2017-02-10 MED ORDER — INSULIN DETEMIR 100 UNIT/ML FLEXPEN: 60.0000 [IU] | PEN_INJECTOR | Freq: Two times a day (BID) | SUBCUTANEOUS | 0 refills | Status: DC

## 2017-02-10 NOTE — Telephone Encounter (Signed)
Sent rx to pharmacy

## 2017-02-10 NOTE — Telephone Encounter (Signed)
Pt left v/m; wanting to know if insulin from [employer] patient assistance program has come in. Pt gets it every 3 months and pt only has 3 vials left/ pt request cb.

## 2017-02-10 NOTE — Telephone Encounter (Signed)
Patient called about insulin and she wants to pick it up today. Patient can be reached at  [phone].

## 2017-02-10 NOTE — Telephone Encounter (Signed)
Patient left vm requesting a refill. She needs a f/u visit scheduled, left message to schedule appt so we can refill meds.

## 2017-02-13 ENCOUNTER — Telehealth: Payer: Self-pay | Admitting: Family Medicine

## 2017-02-13 HISTORY — PX: SHOULDER ARTHROSCOPY: SHX128

## 2017-02-13 NOTE — Telephone Encounter (Addendum)
Note:  Patient is NOT taking Levemir.  She has been on Lantus and receiving through patient assistance program.  Levemir removed from med list and cancelled r/x sent in to [facility] on 02/10/17.  I contacted [employer] (medication assistance program for patient's Lantus) phone:  [phone] to discuss status of her med delivery.  (copy of med assistance application has been copied to media).    [employer] shows that approval is good until 10/2017; however, they require that we complete a refill request form every 90 days before supply will be shipped.  They do not automatically send a request, this is on the patient and the doctor's office to keep up with and communicate with them.  After receiving and processing, they will mail insulin directly to [place].  They have faxed me the reorder form and say we can make copies of this to use on an ongoing basis.    I LM for patient explaining this and request that she please give [place] at least 2-3 weeks notice before running out of her lantus in to future to allow adequate processing time.   Also, patient needs to make an appointment for follow up.  I will forward this to Dr. Darnell Level CMA Lattie Haw and discuss with her so that she can keep track with this moving forward and continue communications with patient.

## 2017-02-13 NOTE — Telephone Encounter (Signed)
I have forwarded the information to Dr. Synthia Innocent CMA, Lattie Haw.  She will contact patient to follow up on her insulin this afternoon.

## 2017-02-13 NOTE — Telephone Encounter (Signed)
Noted. Thank you for checking with [employer] and with patient.

## 2017-02-13 NOTE — Telephone Encounter (Signed)
PT called about insulin she picks up at our office thru assistance program. She has 2 pens left at this point and needs to get her meds.

## 2017-02-13 NOTE — Addendum Note (Signed)
Addended by: Magdalen Spatz C on: 02/13/2017 12:46 PM   Modules accepted: Orders

## 2017-02-13 NOTE — Telephone Encounter (Signed)
Error

## 2017-02-13 NOTE — Telephone Encounter (Signed)
Pt returned your call  Please return call

## 2017-02-13 NOTE — Telephone Encounter (Signed)
Spoke with pt notifying her refill request was faxed to [facility] today. Per Mandy's suggestion, I asked that the pt call us when she is down to 3 weeks so we can fill out the refill request and fax it, which has to be done each time. She says ok she can do that. Expresses her thanks.

## 2017-02-16 NOTE — Telephone Encounter (Signed)
PT called very upset that she has to be seen to get her prescription. She has 1 pen left so she does not have enough to get thru the weekend. She does not have $80 to come in for OV at this time, she does think she can get enough money before Thanksgiving. She is requesting a cb to discuss.  A1c today at [facility] was 7.6

## 2017-02-17 NOTE — Telephone Encounter (Signed)
Spoke with Karen [name] at [facility]. Says fax was received and order is shipping to our office in next 3-5 business days.

## 2017-02-17 NOTE — Telephone Encounter (Signed)
Left message on vm per pt request relaying message per Dr. Darnell Level about temporarily decreasing insulin to 30 units twice a day until we get her rx in. Also, notified her I spoke with [employer] and was told it is shipping in the next 3-5 business days.

## 2017-02-17 NOTE — Telephone Encounter (Signed)
Spoke with pt relaying message per Dr. Oneita Jolly does not have insurance and cannot afford to buy at local pharmacy. Asking what she is supposed to do? Asks for cb. Gives permission to leave vm.

## 2017-02-17 NOTE — Telephone Encounter (Addendum)
plz call - I do recommend office visit for DM f/u - may schedule for November. It's our policy to see patients on any medication at least once yearly, last seen here 03/2016. With diabetes really needs more frequent office visits to best manage sugars. If she can't come in then at least she should be sending [place] sugar logs to review.  We are in process of getting her insulin. Is there a cheaper form she can get at local pharmacy while her lantus comes in?

## 2017-02-17 NOTE — Telephone Encounter (Addendum)
Can we contact [employer] to get an estimate of when insulin will be available then notify patient? Can she afford a few days or a vial of other insulin through pharmacy? She may need to decrease lantus to 30u twice daily temporarily to space out insulin. That may be only option as we don't have access to samples and she cannot afford other insulin.  plz have patient keep a calendar so she can remind [place] to fax insulin application to [employer] every 3 months.

## 2017-02-21 ENCOUNTER — Telehealth: Payer: Self-pay

## 2017-02-21 NOTE — Telephone Encounter (Signed)
Left message on vm for pt to cb. [Pt's Lantus was delivered today and is ready for her to pick up. Located in vaccine refrigerator.]

## 2017-02-22 ENCOUNTER — Telehealth: Payer: Self-pay | Admitting: *Deleted

## 2017-02-22 NOTE — Telephone Encounter (Signed)
Patient came in office to drop off a verification form for her animal. Patient needs Dr. Danise Mina to fill out ,.  The form will be in the RX box. Patient states you can call her if you have any questions . Her contact number is [phone].

## 2017-02-22 NOTE — Telephone Encounter (Signed)
Pt aware  She stated she will be here shortly to pick this up

## 2017-02-22 NOTE — Telephone Encounter (Signed)
Received rest of Lantus shipment. Spoke with pt notifying her they are ready to pick up. [Placed 6 boxes, 5 pens ea) in refrigerator on Dr. Synthia Innocent side.]

## 2017-02-22 NOTE — Telephone Encounter (Signed)
Thanks

## 2017-02-22 NOTE — Telephone Encounter (Addendum)
Pt picked up Lantus shipment today, however, there was only 1 box (5 pens) included. Per Dr. Darnell Level, 12 were ordered. Called [employer] at [phone], spoke with [facility], to inquire about the missing boxes. Says 2 more packages (3 boxes ea) are scheduled to be delivered today by 4:30 PM.

## 2017-02-22 NOTE — Telephone Encounter (Signed)
Placed form in Dr. G's box.  

## 2017-02-27 NOTE — Telephone Encounter (Signed)
plz notify I don't fill these forms out. They must be filled out by a mental health professional (counselor, psychiatrist) - it even says it on the form. Placed back in Lisa's box.

## 2017-02-27 NOTE — Telephone Encounter (Signed)
Attempted to contact pt. Left message to cb. [Need to relay message per Dr. Darnell Level.  Placed form at front office for pt to pick up.]

## 2017-02-27 NOTE — Telephone Encounter (Signed)
Return call from pt. Relayed message per Dr. Darnell Level and notified her the form is at the front office. Says ok.

## 2017-03-05 ENCOUNTER — Encounter: Payer: Self-pay | Admitting: Family Medicine

## 2017-03-28 ENCOUNTER — Other Ambulatory Visit: Payer: Self-pay | Admitting: Family Medicine

## 2017-04-11 ENCOUNTER — Ambulatory Visit: Payer: Self-pay | Admitting: Family Medicine

## 2017-05-11 ENCOUNTER — Ambulatory Visit: Payer: Self-pay | Admitting: Family Medicine

## 2017-05-11 ENCOUNTER — Telehealth: Payer: Self-pay

## 2017-05-11 NOTE — Telephone Encounter (Signed)
Per Larene Beach, pt's Lantus arrived today and is in the refrigerator by my desk. Fwd to Dr. Darnell Level as Juluis Rainier.

## 2017-05-11 NOTE — Telephone Encounter (Signed)
PLEASE NOTE: All timestamps contained within this report are represented as [place] Standard Time. CONFIDENTIALTY NOTICE: This fax transmission is intended only for the addressee. It contains information that is legally privileged, confidential or otherwise protected from use or disclosure. If you are not the intended recipient, you are strictly prohibited from reviewing, disclosing, copying using or disseminating any of this information or taking any action in reliance on or regarding this information. If you have received this fax in error, please notify us immediately by telephone so that we can arrange for its return to [place]. Phone: [phone], Toll-Free: [phone], Fax: [phone] Page: 1 of 1 Call Id: [id number] [place] Night - Client Nonclinical Telephone Record [place] Night - Client Client Site [address] Physician [name] - MD Contact Type Call Who Is Calling Patient / Member / Family / Caregiver Caller Name [place] Phone Number [phone] Patient Name Karen [name] Patient DOB Aug 04, 1955 Call Type Message Only Information Provided Reason for Call Request to [facility] Appointment Initial Comment Caller said she needs to cancel her appt. tomorrow. Additional Comment Call Closed By: Emeline Gins Transaction Date/Time: 05/10/2017 8:05:11 PM (ET)

## 2017-05-11 NOTE — Telephone Encounter (Signed)
Pt scheduled refill med visit thru mychart for 05/17/16 at 12:45 pm. Now pt request to order insulin from [employer] thru pt assistance program. Pt wants to pick up insulin when she comes for the appt on 05/17/16.

## 2017-05-12 NOTE — Telephone Encounter (Signed)
Pt has rescheduled

## 2017-05-17 ENCOUNTER — Encounter: Payer: Self-pay | Admitting: Family Medicine

## 2017-05-17 ENCOUNTER — Encounter: Payer: Self-pay | Admitting: Emergency Medicine

## 2017-05-17 ENCOUNTER — Ambulatory Visit (INDEPENDENT_AMBULATORY_CARE_PROVIDER_SITE_OTHER): Payer: Self-pay | Admitting: Family Medicine

## 2017-05-17 VITALS — BP 118/60 | HR 93 | Temp 97.7°F | Wt 178.0 lb

## 2017-05-17 DIAGNOSIS — IMO0002 Reserved for concepts with insufficient information to code with codable children: Secondary | ICD-10-CM

## 2017-05-17 DIAGNOSIS — E113399 Type 2 diabetes mellitus with moderate nonproliferative diabetic retinopathy without macular edema, unspecified eye: Secondary | ICD-10-CM

## 2017-05-17 DIAGNOSIS — Z23 Encounter for immunization: Secondary | ICD-10-CM

## 2017-05-17 DIAGNOSIS — Z9861 Coronary angioplasty status: Secondary | ICD-10-CM

## 2017-05-17 DIAGNOSIS — F3340 Major depressive disorder, recurrent, in remission, unspecified: Secondary | ICD-10-CM

## 2017-05-17 DIAGNOSIS — I2510 Atherosclerotic heart disease of native coronary artery without angina pectoris: Secondary | ICD-10-CM

## 2017-05-17 DIAGNOSIS — E1140 Type 2 diabetes mellitus with diabetic neuropathy, unspecified: Secondary | ICD-10-CM

## 2017-05-17 DIAGNOSIS — E785 Hyperlipidemia, unspecified: Secondary | ICD-10-CM

## 2017-05-17 DIAGNOSIS — E1165 Type 2 diabetes mellitus with hyperglycemia: Secondary | ICD-10-CM

## 2017-05-17 LAB — BASIC METABOLIC PANEL
BUN: 15 mg/dL (ref 6–23)
CALCIUM: 9.4 mg/dL (ref 8.4–10.5)
CHLORIDE: 100 meq/L (ref 96–112)
CO2: 23 mEq/L (ref 19–32)
CREATININE: 0.67 mg/dL (ref 0.40–1.20)
GFR: 95.51 mL/min (ref >60.00)
Glucose, Bld: 365 mg/dL — ABNORMAL HIGH (ref 70–99)
Potassium: 4.6 mEq/L (ref 3.5–5.1)
Sodium: 135 mEq/L (ref 135–145)

## 2017-05-17 LAB — LDL CHOLESTEROL, DIRECT: Direct LDL: 111 mg/dL

## 2017-05-17 LAB — HEMOGLOBIN A1C: Hgb A1c MFr Bld: 10 % — ABNORMAL HIGH (ref 4.6–6.5)

## 2017-05-17 NOTE — Patient Instructions (Addendum)
Flu shot today Labs today.  Continue current medicines Return in 3 months for diabetes follow up visit.

## 2017-05-17 NOTE — Assessment & Plan Note (Signed)
Known retinopathy. Encouraged f/u with ophtho when she can. Encouraged better sugar control.

## 2017-05-17 NOTE — Assessment & Plan Note (Signed)
Chronic, stable on celexa 20mg  daily. Requests letter to be able to keep her pet at new subsidized apartment she will move into. Letter provided today.

## 2017-05-17 NOTE — Assessment & Plan Note (Addendum)
Endorses compliance with current regimen but not adherent to diabetic diet especially around holidays. Will check A1c. Continue current regimen of metformin, lantus, and glipizide (sounds like she takes 1/2-1 tab bid PRN meal size). States she cannot afford eye exam. Lantus medication provided today #6 boxes with 5 pens each.

## 2017-05-17 NOTE — Progress Notes (Signed)
BP 118/60 (BP Location: Right Arm, Patient Position: Sitting, Cuff Size: Normal)   Pulse 93   Temp 97.7 F (36.5 C) (Oral)   Wt 178 lb (80.7 kg)   SpO2 95%   BMI 33.63 kg/m    CC: DM f/u Subjective:    Patient ID: Karen [name], female    DOB: [birth year], 62 y.o.   MRN: [id number]  HPI: Karen [name] is a 62 y.o. female presenting on 05/17/2017 for Diabetes   Self pay patient - states she does not qualify for [facility] plans.  Overdue for DM f/u - last seen 03/2016. Also here to pick up lantus insulin.  She did see [facility] at [facility] in interim, thinks she would like to continue coming here as PCP.   She had L shoulder surgery 02/2017 (Dr Smitty Cords at [facility]).   DM - does not regularly check sugars, last checked several weeks ago and it was 200s. Compliant with antihyperglycemic regimen which includes: glipizide 5mg  1/2 tablet in am with breakfast and 1/2-1 tablet with lunch or dinner, glucophage 1000mg  bid and lantus 60u BID. Denies low sugars or hypoglycemic symptoms. Some R hand paresthesia. Last diabetic eye exam overdue - h/o retinopathy. Pneumovax: 2015. Prevnar: not due. Glucometer brand: accuchek. DSME: not interested.  Lab Results  Component Value Date   HGBA1C 10.6 (H) 02/08/2016   Diabetic Foot Exam - Simple   Simple Foot Form Diabetic Foot exam was performed with the following findings:  Yes 05/17/2017  1:37 PM  Visual Inspection No deformities, no ulcerations, no other skin breakdown bilaterally:  Yes Sensation Testing See comments:  Yes Pulse Check See comments:  Yes Comments Diminished sensation to monofilament, diminished DP bilaterally    Lab Results  Component Value Date   MICROALBUR 3.3 (H) 07/02/2015     Relevant past medical, surgical, family and social history reviewed and updated as indicated. Interim medical history since our last visit reviewed. Allergies and medications reviewed and updated. Outpatient Medications Prior to Visit    Medication Sig Dispense Refill  . acetaminophen (TYLENOL) 500 MG tablet Take 1,000 mg by mouth 2 (two) times daily.     Marland Kitchen aspirin EC 325 MG tablet Take once daily for DVT prophylaxis and then may resume any previous ASA regimen    . aspirin EC 81 MG EC tablet Take 1 tablet (81 mg total) by mouth daily.    . citalopram (CELEXA) 20 MG tablet Take 1 tablet (20 mg total) by mouth daily. 30 tablet 0  . furosemide (LASIX) 20 MG tablet Take 1 tablet (20 mg total) by mouth daily as needed for edema. 30 tablet 3  . gabapentin (NEURONTIN) 100 MG capsule Take 2 capsules (200 mg total) by mouth 2 (two) times daily. 120 capsule 3  . glipiZIDE (GLUCOTROL) 5 MG tablet Take 0.5-1 tablets (2.5-5 mg total) by mouth 2 (two) times daily. Depending on meal size    . Insulin Glargine (LANTUS SOLOSTAR) 100 UNIT/ML Solostar Pen Inject 60 Units into the skin 2 (two) times daily. QS 1 mo 12 pen 11  . Insulin Pen Needle (PEN NEEDLES) 31G X 8 MM MISC Use to inject Lantus as directed twice daily. Dx: E11.40 60 each 3  . KLOR-CON SPRINKLE 10 MEQ CR capsule Take 1 capsule (10 mEq total) by mouth daily as needed. When taking Lasix 30 capsule 3  . lisinopril (PRINIVIL,ZESTRIL) 2.5 MG tablet Take by mouth.    . Loperamide HCl (IMODIUM A-D PO) Take  1-2 tablets by mouth daily as needed (for stomach).     . metFORMIN (GLUCOPHAGE) 1000 MG tablet Take 1 tablet (1,000 mg total) by mouth 2 (two) times daily with a meal. 180 tablet 0  . nitroGLYCERIN (NITROSTAT) 0.4 MG SL tablet Place 1 tablet (0.4 mg total) under the tongue every 5 (five) minutes as needed for chest pain. 25 tablet 2  . omeprazole (PRILOSEC) 40 MG capsule 1 capsule Once a day Orally 90 days    . simvastatin (ZOCOR) 40 MG tablet Take 1 tablet (40 mg total) by mouth daily at 6 PM.    . traMADol (ULTRAM) 50 MG tablet Take by mouth.    Marland Kitchen glipiZIDE (GLUCOTROL) 5 MG tablet Take by mouth.    . ondansetron (ZOFRAN ODT) 8 MG disintegrating tablet Take 1 tablet (8 mg total) by  mouth every 8 (eight) hours as needed for nausea or vomiting. 10 tablet 0  . pantoprazole (PROTONIX) 40 MG tablet Take 1 tablet (40 mg total) by mouth daily. 30 tablet 3  . promethazine (PHENERGAN) 25 MG tablet Take 1 tablet by mouth every 8 (eight) hours as needed for nausea.   0  . rosuvastatin (CRESTOR) 10 MG tablet Take 1 tablet (10 mg total) by mouth daily. 30 tablet 3  . simvastatin (ZOCOR) 40 MG tablet Take by mouth.    . empagliflozin (JARDIANCE) 10 MG TABS tablet Take 10 mg by mouth daily. 30 tablet 3  . losartan (COZAAR) 25 MG tablet Take 1 tablet (25 mg total) by mouth daily. 90 tablet 3   No facility-administered medications prior to visit.      Per HPI unless specifically indicated in ROS section below Review of Systems     Objective:    BP 118/60 (BP Location: Right Arm, Patient Position: Sitting, Cuff Size: Normal)   Pulse 93   Temp 97.7 F (36.5 C) (Oral)   Wt 178 lb (80.7 kg)   SpO2 95%   BMI 33.63 kg/m   Wt Readings from Last 3 Encounters:  05/17/17 178 lb (80.7 kg)  02/07/17 175 lb (79.4 kg)  01/18/17 175 lb 7 oz (79.6 kg)    Physical Exam  Constitutional: She appears well-developed and well-nourished. No distress.  HENT:  Head: Normocephalic and atraumatic.  Right Ear: External ear normal.  Left Ear: External ear normal.  Nose: Nose normal.  Mouth/Throat: Oropharynx is clear and moist. No oropharyngeal exudate.  Eyes: Conjunctivae and EOM are normal. Pupils are equal, round, and reactive to light. No scleral icterus.  Neck: Normal range of motion. Neck supple.  Cardiovascular: Normal rate, regular rhythm, normal heart sounds and intact distal pulses.  No murmur heard. Pulmonary/Chest: Effort normal and breath sounds normal. No respiratory distress. She has no wheezes. She has no rales.  Musculoskeletal: She exhibits no edema.  See HPI for foot exam if done  Lymphadenopathy:    She has no cervical adenopathy.  Skin: Skin is warm and dry. No rash  noted.  Psychiatric: She has a normal mood and affect.  Nursing note and vitals reviewed.  Results for orders placed or performed during the hospital encounter of 01/19/17  CBG monitoring, ED  Result Value Ref Range   Glucose-Capillary 176 (H) 65 - 99 mg/dL      Assessment & Plan:   Problem List Items Addressed This Visit    CAD S/P percutaneous coronary angioplasty    No longer on DAPT. Has not f/u with cards recently.  Relevant Medications   lisinopril (PRINIVIL,ZESTRIL) 2.5 MG tablet   aspirin EC 325 MG tablet   simvastatin (ZOCOR) 40 MG tablet   Depression    Chronic, stable on celexa 20mg  daily. Requests letter to be able to keep her pet at new subsidized apartment she will move into. Letter provided today.       Diabetic retinopathy ([place])    Known retinopathy. Encouraged f/u with ophtho when she can. Encouraged better sugar control.       Relevant Medications   lisinopril (PRINIVIL,ZESTRIL) 2.5 MG tablet   aspirin EC 325 MG tablet   glipiZIDE (GLUCOTROL) 5 MG tablet   simvastatin (ZOCOR) 40 MG tablet   Dyslipidemia    She changed from crestor back to simvastatin due to cost. Prior h/o body aches to crestor. Will monitor. Add dLDL.       Relevant Medications   simvastatin (ZOCOR) 40 MG tablet   Type 2 diabetes, uncontrolled, with neuropathy (HCC) - Primary    Endorses compliance with current regimen but not adherent to diabetic diet especially around holidays. Will check A1c. Continue current regimen of metformin, lantus, and glipizide (sounds like she takes 1/2-1 tab bid PRN meal size). States she cannot afford eye exam. Lantus medication provided today #6 boxes with 5 pens each.       Relevant Medications   lisinopril (PRINIVIL,ZESTRIL) 2.5 MG tablet   aspirin EC 325 MG tablet   glipiZIDE (GLUCOTROL) 5 MG tablet   simvastatin (ZOCOR) 40 MG tablet   Other Relevant Orders   Basic metabolic panel   Hemoglobin A1c   LDL Cholesterol, Direct    Other Visit  Diagnoses    Need for influenza vaccination       Relevant Orders   Flu Vaccine QUAD 6+ mos PF IM (Fluarix Quad PF) (Completed)       Follow up plan: Return in about 3 months (around 08/15/2017), or if symptoms worsen or fail to improve, for follow up visit.  [name], MD

## 2017-05-17 NOTE — Telephone Encounter (Signed)
Will give pt lantus at OV today.

## 2017-05-17 NOTE — Assessment & Plan Note (Signed)
She changed from crestor back to simvastatin due to cost. Prior h/o body aches to crestor. Will monitor. Add dLDL.

## 2017-05-17 NOTE — Assessment & Plan Note (Signed)
No longer on DAPT. Has not f/u with cards recently.

## 2017-05-24 ENCOUNTER — Telehealth: Payer: Self-pay

## 2017-05-24 NOTE — Telephone Encounter (Signed)
Attempted to contact pt.  No answer. VM box not set up.  Need to inform pt there is 1 more box of Lantus at the office ready to pick up.  [Lantus is in refrigerator on Dr. Synthia Innocent side on the door. Labeled with pt's name and DOB.]

## 2017-06-01 NOTE — Telephone Encounter (Signed)
Attempted again to contact pt. No answer. VM box not set up.  Need to inform pt there was 1 more box of Lantus that somehow not given to her at the [facility].   [Lantus is in refrigerator on Dr. Synthia Innocent side on the door.  Labeled with pt's name & DOB.]

## 2017-06-07 ENCOUNTER — Encounter: Payer: Self-pay | Admitting: Family Medicine

## 2017-06-09 ENCOUNTER — Telehealth: Payer: Self-pay

## 2017-06-09 NOTE — Telephone Encounter (Signed)
Noted. Will look out for form.

## 2017-06-09 NOTE — Telephone Encounter (Signed)
Copied from [place]. Topic: Inquiry >> Jun 09, 2017 11:53 AM Conception Chancy, NT wrote: Karen [name] is calling from [facility] and she got a letter stating the pt can have her dog due to helping with depression and manage mood. Corporate office is requesting a form from [employer]. Be filled out. Karen [name] will be faxing over the form and is also requesting a call back.   Karen [name] [phone]

## 2017-06-10 MED ORDER — GABAPENTIN 300 MG PO CAPS: 300.0000 mg | ORAL_CAPSULE | Freq: Three times a day (TID) | ORAL | 11 refills | Status: DC

## 2017-06-14 NOTE — Telephone Encounter (Signed)
Left message for Karen [name] to call back.  Need to let Karen [name] know we have not we have not received a form from [employer] to complete for pt.  Our fax number is [phone].

## 2017-06-14 NOTE — Telephone Encounter (Signed)
[employer] will refax the form immediately.  Please look for it.

## 2017-06-16 NOTE — Telephone Encounter (Signed)
Left message for pt to call back.  Need to inform her the form was faxed and Dr. Synthia Innocent message.  Also, need to notify her 1 box of her Lantus is still at our office and needs to be picked up.  Faxed form to [employer] at [phone].

## 2017-06-16 NOTE — Telephone Encounter (Addendum)
Form filled out and placed in Karen [name]'s box. Plz notify patient - unfortunately I cannot certify she is disabled as she continues working. I don't think it will be approved but we can send in anyways to try.

## 2017-06-19 NOTE — Telephone Encounter (Signed)
Left message for pt to call back.  Need to inform her the form was faxed to [facility] on Fri, 06/16/17.  Also, need to relay Dr. Synthia Innocent message the he cannot certify pt is disabled as she continues working.   Also, pt has 1 last box of Lantus at the office that needs to be picked up.

## 2017-06-21 NOTE — Telephone Encounter (Signed)
Left message for pt to call back.  Need to inform her the form was faxed to [facility] on Fri, 06/16/17. Also, Dr. Darnell Level states he cannot certify pt disabled as she continues to work.  And pt has 1 box of Lantus at our office that needs to be picked up.  Have tried several times to contact pt by phone. I will mail a letter today containing this information.

## 2017-06-25 IMAGING — CR DG CHEST 2V
2 series · 2 of 2 positions shown · non-contrast
Comparison: 09/08/2013

CLINICAL DATA: Cough, chills

EXAM:
CHEST  2 VIEW

[chest pa]
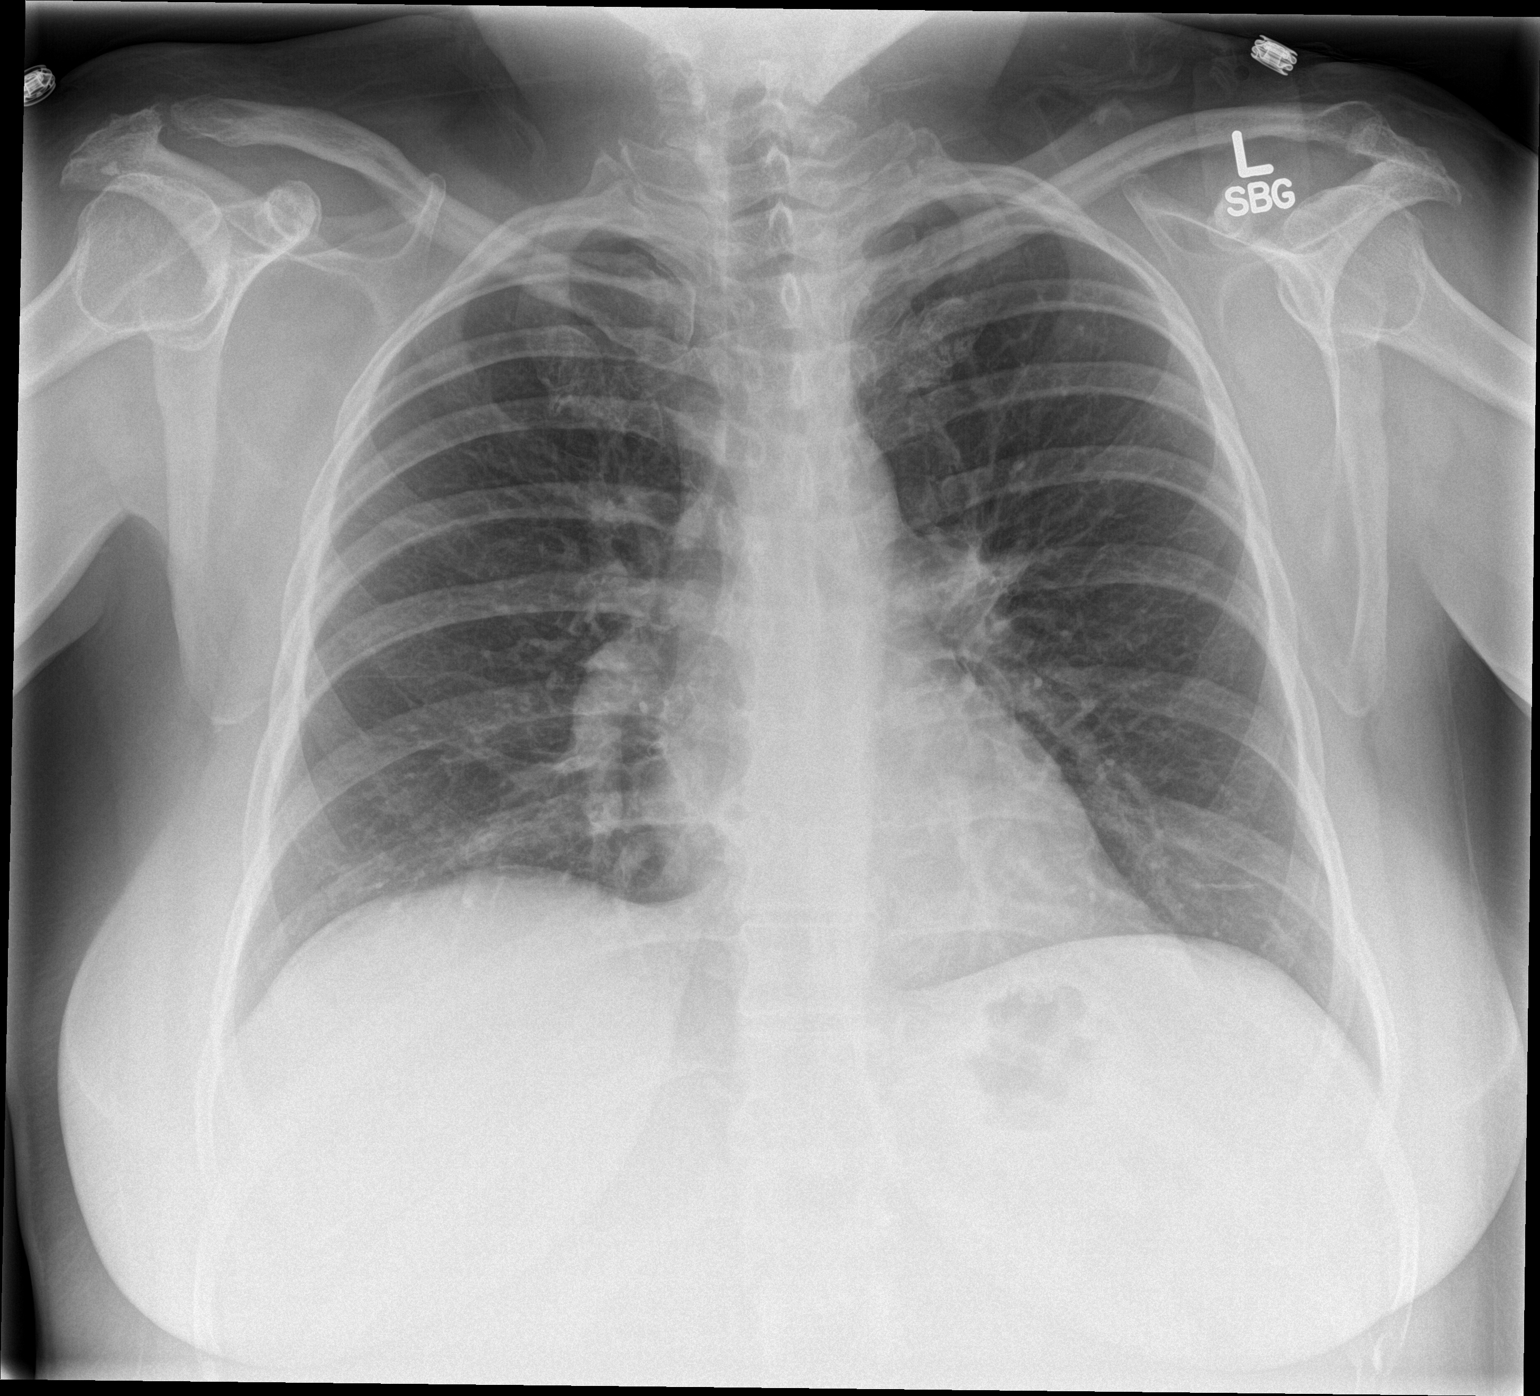

[chest lat]
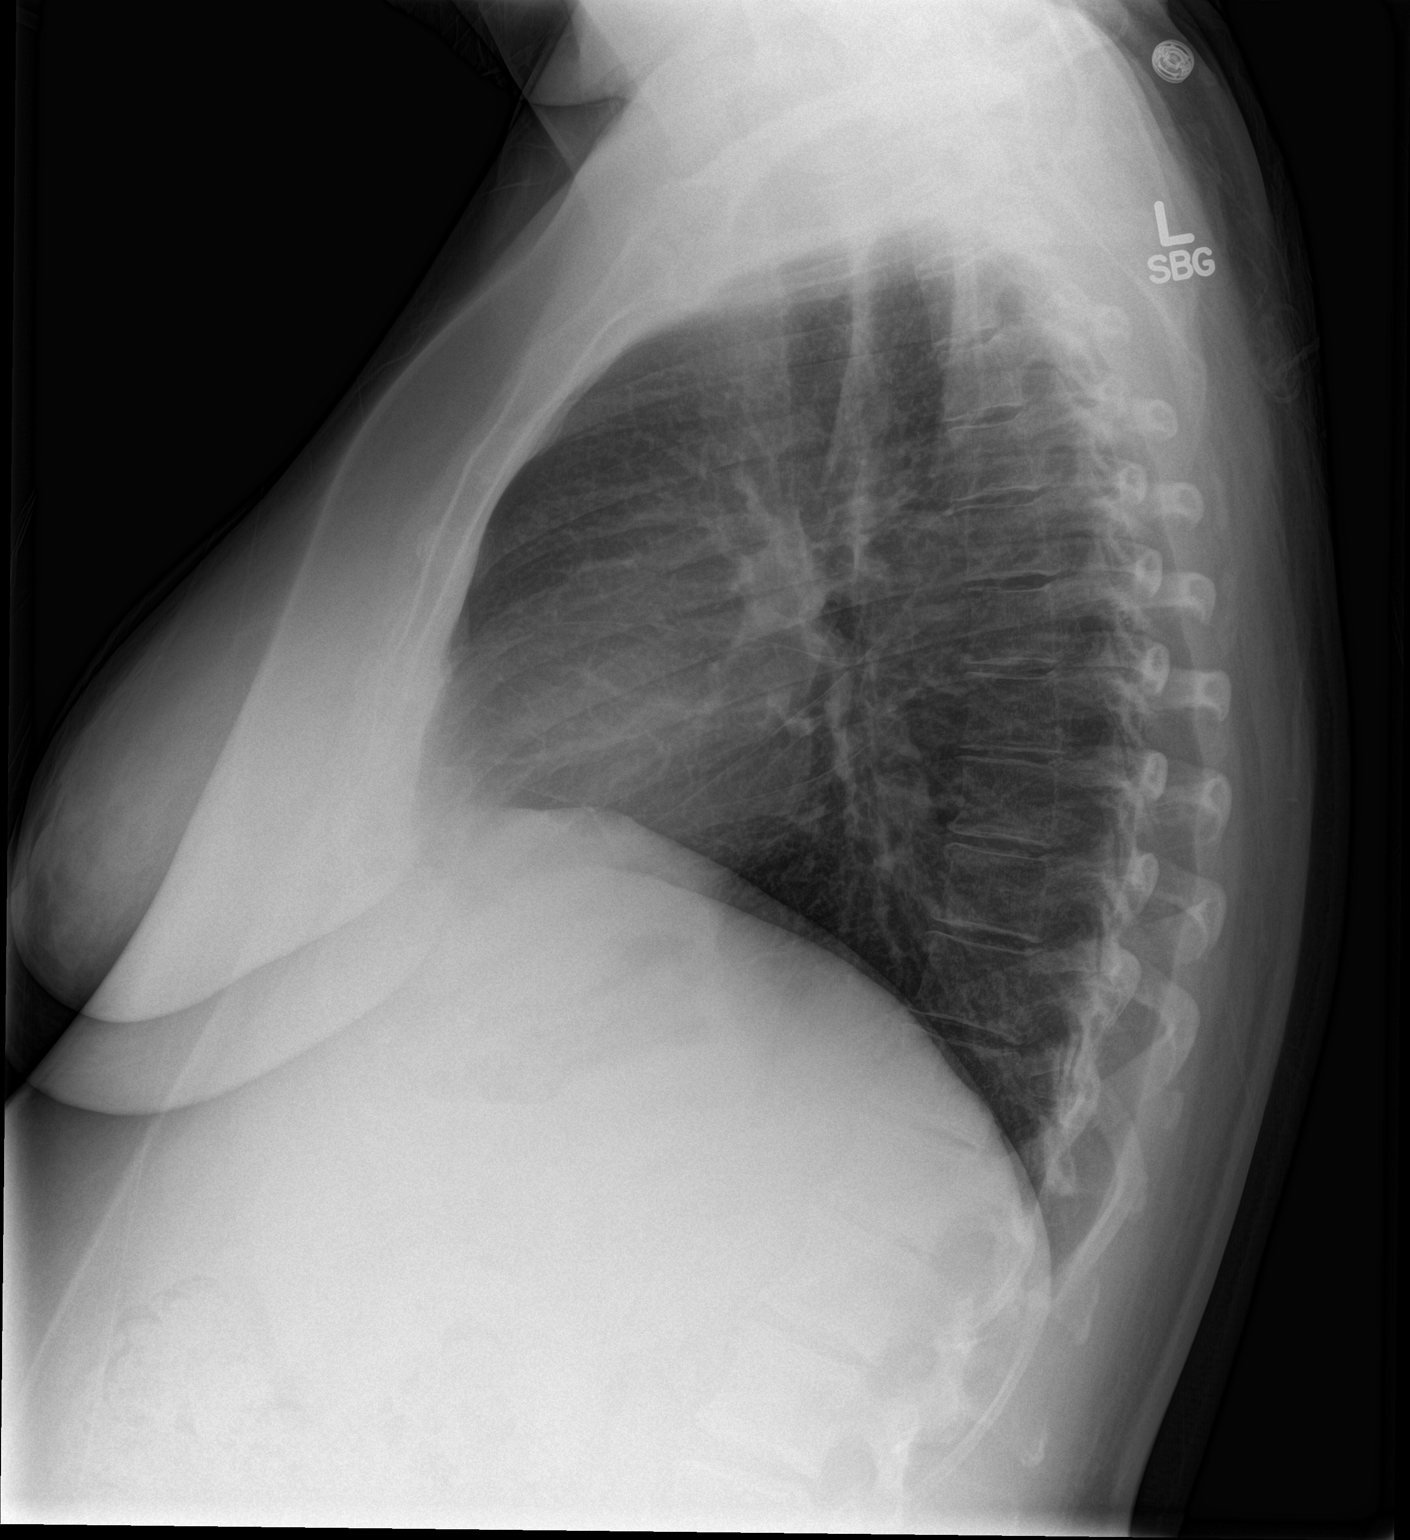

[2 of 2 positions shown; findings below may reference images not displayed]

FINDINGS: Cardiomediastinal silhouette is unremarkable. No acute infiltrate or
pleural effusion. No pulmonary edema. Bony thorax is unremarkable.
IMPRESSION: No active cardiopulmonary disease.

## 2017-07-04 ENCOUNTER — Telehealth: Payer: Self-pay

## 2017-07-04 ENCOUNTER — Encounter: Payer: Self-pay | Admitting: Family Medicine

## 2017-07-04 MED ORDER — LISINOPRIL 2.5 MG PO TABS: 2.5000 mg | ORAL_TABLET | Freq: Every day | ORAL | 0 refills | Status: DC

## 2017-07-04 NOTE — Telephone Encounter (Signed)
Refill sent.  See TE, 07/04/17.

## 2017-07-04 NOTE — Telephone Encounter (Signed)
Sent refill to [facility] [facility] per pt request. (See pt email, 07/04/17).

## 2017-07-24 ENCOUNTER — Other Ambulatory Visit: Payer: Self-pay | Admitting: Family Medicine

## 2017-07-24 NOTE — Telephone Encounter (Signed)
Last filled:  07/24/17, #120 Last OV:  05/17/17 Next OV:  08/15/17

## 2017-07-25 NOTE — Telephone Encounter (Signed)
Got request from [place] for gabapentin 100mg .  We sent in gabapentin 300mg  to [facility] 05/2017 to last 1 year. plz check where is patient getting gabapentin?

## 2017-07-26 NOTE — Telephone Encounter (Signed)
Left message on vm for pt to call back.  Need to relay Dr. Synthia Innocent message and find out what pharmacy she wants to use for gabapentin rx.

## 2017-07-27 NOTE — Telephone Encounter (Addendum)
Left message for pt to call back.  Need to relay Dr. Synthia Innocent message and find out which pharmacy pt uses for gabapentin rx.

## 2017-07-28 ENCOUNTER — Encounter: Payer: Self-pay | Admitting: Family Medicine

## 2017-07-28 ENCOUNTER — Telehealth: Payer: Self-pay

## 2017-07-28 NOTE — Telephone Encounter (Signed)
Received pt's Lantus shipment.  Will notify her it is ready to pick up. [Placed Lantus in refrigerator on Dr. Synthia Innocent side, bottom shelf on the door.]

## 2017-07-28 NOTE — Telephone Encounter (Signed)
Left message for pt to call back.  Mailing a letter.  Need to relay Dr. Synthia Innocent message (see 07/25/17 entry) and find out what pharmacy pt uses for gabapentin rx and the correct dose.  Also, pt's Lantus shipment came in.  Will notify pt it's ready to pick up.

## 2017-08-04 ENCOUNTER — Telehealth: Payer: Self-pay | Admitting: Family Medicine

## 2017-08-04 NOTE — Telephone Encounter (Signed)
Copied from [place]. Topic: Quick Communication - Rx Refill/Question >> Aug 04, 2017  2:40 PM Synthia Innocent wrote: Medication: Insulin Glargine (LANTUS SOLOSTAR) 100 UNIT/ML Solostar Pen Has the patient contacted their pharmacy? Yes.   (Agent: If no, request that the patient contact the pharmacy for the refill.) Preferred Pharmacy (with phone number or street name): [facility]: Please be advised that RX refills may take up to 3 business days. We ask that you follow-up with your pharmacy.

## 2017-08-04 NOTE — Telephone Encounter (Signed)
Patient stated the Lantus that is in the office is from the last shipment.  She stated she needs a refill now. Patient will pick both of them up when she comes to pick up the new prescription.

## 2017-08-04 NOTE — Telephone Encounter (Signed)
Left message for pt to call office.  Need to inform pt her Lantus shipment is here at the office ready to pick up.  Mailed a letter last week with this message. Will send message via [place].  [Lantus shipment is in refrigerator on Dr. Synthia Innocent side in the door on the bottom shelf.]

## 2017-08-08 NOTE — Telephone Encounter (Signed)
Pt is communicating via MyChart.    [See pt email, 08/04/17.]

## 2017-08-15 ENCOUNTER — Ambulatory Visit: Payer: Self-pay | Admitting: Family Medicine

## 2017-08-17 ENCOUNTER — Encounter: Payer: Self-pay | Admitting: Family Medicine

## 2017-08-24 MED ORDER — TRAMADOL HCL 50 MG PO TABS: 50.0000 mg | ORAL_TABLET | Freq: Every day | ORAL | 0 refills | Status: DC | PRN

## 2017-08-25 ENCOUNTER — Encounter: Payer: Self-pay | Admitting: Family Medicine

## 2017-08-25 ENCOUNTER — Telehealth: Payer: Self-pay | Admitting: Family Medicine

## 2017-08-25 MED ORDER — TRAMADOL HCL 50 MG PO TABS: 50.0000 mg | ORAL_TABLET | Freq: Every day | ORAL | 0 refills | Status: DC | PRN

## 2017-08-25 NOTE — Telephone Encounter (Signed)
Rx was sent to [facility] today.  Attempted to contact pt. No answer. VM is full.  Need to inform pt rx was sent.

## 2017-08-25 NOTE — Telephone Encounter (Signed)
Copied from [place] [phone]. Topic: Quick Communication - Rx Refill/Question >> Aug 25, 2017  1:34 PM Lennox Solders wrote: Medication: tramadol Has the patient contacted their pharmacy no Pt tramadol was sent to [facility]. Pt [facility] not use [facility]. Please send to [facility] phone [phone]

## 2017-08-25 NOTE — Telephone Encounter (Signed)
plz call and cancel tramadol Rx at [facility]. I've sent to [facility]. I've removed [facility] from pharmacy list.

## 2017-09-14 ENCOUNTER — Other Ambulatory Visit: Payer: Self-pay | Admitting: Family Medicine

## 2017-09-14 NOTE — Telephone Encounter (Signed)
Last filled:  07/24/17, #120 Last OV:  05/17/17 Next OV:  10/03/17

## 2017-09-28 ENCOUNTER — Other Ambulatory Visit: Payer: Self-pay | Admitting: Family Medicine

## 2017-09-28 ENCOUNTER — Encounter: Payer: Self-pay | Admitting: Family Medicine

## 2017-09-28 DIAGNOSIS — M7502 Adhesive capsulitis of left shoulder: Secondary | ICD-10-CM | POA: Insufficient documentation

## 2017-09-28 NOTE — Telephone Encounter (Signed)
Last rx:  08/25/17, #30 Last OV:  05/17/17 Next OV:  11/07/17

## 2017-09-28 NOTE — Telephone Encounter (Signed)
Copied from [place] [id number]. Topic: Quick Communication - Rx Refill/Question >> Sep 28, 2017  1:15 PM Margot Ables wrote: Medication: traMADol (ULTRAM) 50 MG tablet - 2 pills left - takes prn Has the patient contacted their pharmacy? No refills Preferred Pharmacy (with phone number or street name): [facility], [place], [place] - [place] [phone] (Phone) [phone] (Fax)

## 2017-09-29 MED ORDER — TRAMADOL HCL 50 MG PO TABS: 50.0000 mg | ORAL_TABLET | Freq: Every day | ORAL | 0 refills | Status: DC | PRN

## 2017-09-29 NOTE — Telephone Encounter (Signed)
E perscribed 

## 2017-10-03 ENCOUNTER — Ambulatory Visit: Payer: Self-pay | Admitting: Family Medicine

## 2017-10-03 NOTE — Telephone Encounter (Signed)
ERROR

## 2017-10-05 ENCOUNTER — Telehealth: Payer: Self-pay

## 2017-10-05 NOTE — Telephone Encounter (Signed)
Received Pt Connection application from [facility] for [employer].  Placed in Dr. Synthia Innocent box.

## 2017-10-06 NOTE — Telephone Encounter (Signed)
Signed and in Lisa's box.

## 2017-10-06 NOTE — Telephone Encounter (Signed)
Form faxed

## 2017-10-12 ENCOUNTER — Telehealth: Payer: Self-pay

## 2017-10-12 NOTE — Telephone Encounter (Addendum)
Left message for pt to call back.  Need pt to come by the office, asap, to sign the new [employer] Patient Connection application for the Lantus.  [Placed form at front office.]

## 2017-10-13 NOTE — Telephone Encounter (Addendum)
Left message for pt to call back.  Need pt to come by the office, asap, to sign the new [employer] Patient Connection application for the Lantus.  Also sent a MyChart message to pt with this message.

## 2017-10-31 ENCOUNTER — Other Ambulatory Visit: Payer: Self-pay | Admitting: Family Medicine

## 2017-11-07 ENCOUNTER — Encounter: Payer: Self-pay | Admitting: Family Medicine

## 2017-11-07 ENCOUNTER — Ambulatory Visit: Payer: Self-pay | Admitting: Family Medicine

## 2017-11-07 VITALS — BP 122/74 | HR 88 | Temp 98.1°F | Ht 61.0 in | Wt 181.5 lb

## 2017-11-07 DIAGNOSIS — E1140 Type 2 diabetes mellitus with diabetic neuropathy, unspecified: Secondary | ICD-10-CM

## 2017-11-07 DIAGNOSIS — E785 Hyperlipidemia, unspecified: Secondary | ICD-10-CM

## 2017-11-07 DIAGNOSIS — I10 Essential (primary) hypertension: Secondary | ICD-10-CM

## 2017-11-07 DIAGNOSIS — IMO0002 Reserved for concepts with insufficient information to code with codable children: Secondary | ICD-10-CM

## 2017-11-07 DIAGNOSIS — E1165 Type 2 diabetes mellitus with hyperglycemia: Secondary | ICD-10-CM

## 2017-11-07 DIAGNOSIS — E113399 Type 2 diabetes mellitus with moderate nonproliferative diabetic retinopathy without macular edema, unspecified eye: Secondary | ICD-10-CM

## 2017-11-07 LAB — POCT GLYCOSYLATED HEMOGLOBIN (HGB A1C): HEMOGLOBIN A1C: 10.5 % — AB (ref 4.0–5.6)

## 2017-11-07 MED ORDER — GABAPENTIN 300 MG PO CAPS
300.0000 mg | ORAL_CAPSULE | Freq: Every day | ORAL | 3 refills | Status: DC
Start: 2017-11-07 — End: 2018-12-23

## 2017-11-07 MED ORDER — ACETAMINOPHEN ER 650 MG PO TBCR: 1300.0000 mg | EXTENDED_RELEASE_TABLET | Freq: Two times a day (BID) | ORAL | Status: DC | PRN

## 2017-11-07 NOTE — Assessment & Plan Note (Signed)
Chronic, stable. Not convinced ACEI induced cough as symptoms persist despite stopping med. Refilled today. Update if worsening cough.

## 2017-11-07 NOTE — Patient Instructions (Addendum)
A1c was too high today. Work on low sugar low carb diabetic diet. Consider diabetes education classes when you get insurance.  Return in 4 months for diabetes check.

## 2017-11-07 NOTE — Assessment & Plan Note (Signed)
Overdue for f/u - trouble affording.

## 2017-11-07 NOTE — Assessment & Plan Note (Signed)
Chronic, uncontrolled. Evidence of neuropathy on exam. Not checking sugars regularly. Encouraged closer adherence to diabetic diet, handout provided today. RTC 4 mo close f/u. Advised schedule CPE when she can as overdue.

## 2017-11-07 NOTE — Assessment & Plan Note (Addendum)
Continue simvastatin (more affordable than crestor, body aches to crestor).  The 10-year ASCVD risk score Mikey Bussing DC Brooke Bonito., et al., 2013) is: 10.4%   Values used to calculate the score:     Age: 62 years     Sex: Female     Is Non-Hispanic African American: No     Diabetic: Yes     Tobacco smoker: No     Systolic Blood Pressure: 888 mmHg     Is BP treated: Yes     HDL Cholesterol: 36.4 mg/dL     Total Cholesterol: 210 mg/dL

## 2017-11-07 NOTE — Progress Notes (Signed)
BP 122/74 (BP Location: Left Arm, Patient Position: Sitting, Cuff Size: Normal)   Pulse 88   Temp 98.1 F (36.7 C) (Oral)   Ht 5\' 1"  (1.549 m)   Wt 181 lb 8 oz (82.3 kg)   SpO2 94%   BMI 34.29 kg/m    CC: 3 mo DM f/u visit Subjective:    Patient ID: Karen [name], female    DOB: [birth year], 62 y.o.   MRN: [id number]  HPI: Karen [name] is a 62 y.o. female presenting on 11/07/2017 for Diabetes (Here for 3 mo f/u.) and Cough (Pt c/o dry cough for awhile. Thinks it may be due to lisinopril. )   Worried lisinopril may be causing dry cough. However, she ran out last week and has had persistent dry cough with hoarseness. ?GERD related - some acid reflux intermittently at night.   Continues celexa 20mg  daily.  HLD - compliant with simvastatin without myalgias.   DM - does not regularly check sugars. Last checked several weeks ago - 250. We've prevoiusly provided sugar log to keep track. Compliant with antihyperglycemic regimen which includes: glipizide 5mg  bid, lantus 60u bid, metformin 1000mg  bid. Doesn't follow diabetic diet - has been eating more sweets recently. Drinks diet Dr Malachi Bonds. Denies low sugars or hypoglycemic symptoms. Foot paresthesias. Last diabetic eye exam DUE - unaffordable. Pneumovax: 2015. Prevnar: not due. Glucometer brand: Accuchek Guide. DSME: cannot afford at this time.  Lab Results  Component Value Date   HGBA1C 10.5 (A) 11/07/2017   Diabetic Foot Exam - Simple   Simple Foot Form Diabetic Foot exam was performed with the following findings:  Yes 11/07/2017  2:48 PM  Visual Inspection No deformities, no ulcerations, no other skin breakdown bilaterally:  Yes Sensation Testing See comments:  Yes Pulse Check See comments:  Yes Comments Diminished sensation to monofilament testing  Diminished pulses bilaterally    Lab Results  Component Value Date   MICROALBUR 3.3 (H) 07/02/2015     Relevant past medical, surgical, family and social history reviewed and  updated as indicated. Interim medical history since our last visit reviewed. Allergies and medications reviewed and updated. Outpatient Medications Prior to Visit  Medication Sig Dispense Refill  . aspirin EC 81 MG EC tablet Take 1 tablet (81 mg total) by mouth daily.    . citalopram (CELEXA) 20 MG tablet Take 1 tablet (20 mg total) by mouth daily. 30 tablet 0  . glipiZIDE (GLUCOTROL) 5 MG tablet Take 0.5-1 tablets (2.5-5 mg total) by mouth 2 (two) times daily. Depending on meal size    . Insulin Glargine (LANTUS SOLOSTAR) 100 UNIT/ML Solostar Pen Inject 60 Units into the skin 2 (two) times daily. QS 1 mo 12 pen 11  . Insulin Pen Needle (PEN NEEDLES) 31G X 8 MM MISC Use to inject Lantus as directed twice daily. Dx: E11.40 60 each 3  . Loperamide HCl (IMODIUM A-D PO) Take 1-2 tablets by mouth daily as needed (for stomach).     . metFORMIN (GLUCOPHAGE) 1000 MG tablet Take 1 tablet (1,000 mg total) by mouth 2 (two) times daily with a meal. 180 tablet 0  . nitroGLYCERIN (NITROSTAT) 0.4 MG SL tablet Place 1 tablet (0.4 mg total) under the tongue every 5 (five) minutes as needed for chest pain. 25 tablet 2  . omeprazole (PRILOSEC) 40 MG capsule 1 capsule Once a day Orally 90 days    . simvastatin (ZOCOR) 40 MG tablet TAKE 1 TABLET (40 MG TOTAL) BY MOUTH DAILY.  30 tablet 3  . traMADol (ULTRAM) 50 MG tablet Take 1 tablet (50 mg total) by mouth daily as needed. 30 tablet 0  . acetaminophen (TYLENOL) 500 MG tablet Take 1,000 mg by mouth 2 (two) times daily.     Marland Kitchen gabapentin (NEURONTIN) 300 MG capsule Take 1 capsule (300 mg total) by mouth 3 (three) times daily. 90 capsule 11  . furosemide (LASIX) 20 MG tablet Take 1 tablet (20 mg total) by mouth daily as needed for edema. (Patient not taking: Reported on 11/07/2017) 30 tablet 3  . KLOR-CON SPRINKLE 10 MEQ CR capsule Take 1 capsule (10 mEq total) by mouth daily as needed. When taking Lasix (Patient not taking: Reported on 11/07/2017) 30 capsule 3  . lisinopril  (PRINIVIL,ZESTRIL) 2.5 MG tablet Take 1 tablet (2.5 mg total) by mouth daily. (Patient not taking: Reported on 11/07/2017) 90 tablet 0  . aspirin EC 325 MG tablet Take once daily for DVT prophylaxis and then may resume any previous ASA regimen     No facility-administered medications prior to visit.      Per HPI unless specifically indicated in ROS section below Review of Systems     Objective:    BP 122/74 (BP Location: Left Arm, Patient Position: Sitting, Cuff Size: Normal)   Pulse 88   Temp 98.1 F (36.7 C) (Oral)   Ht 5\' 1"  (1.549 m)   Wt 181 lb 8 oz (82.3 kg)   SpO2 94%   BMI 34.29 kg/m   Wt Readings from Last 3 Encounters:  11/07/17 181 lb 8 oz (82.3 kg)  05/17/17 178 lb (80.7 kg)  02/07/17 175 lb (79.4 kg)    Physical Exam  Constitutional: She appears well-developed and well-nourished. No distress.  HENT:  Head: Normocephalic and atraumatic.  Right Ear: External ear normal.  Left Ear: External ear normal.  Nose: Nose normal.  Mouth/Throat: Oropharynx is clear and moist. No oropharyngeal exudate.  Eyes: Pupils are equal, round, and reactive to light. Conjunctivae and EOM are normal. No scleral icterus.  Neck: Normal range of motion. Neck supple.  Cardiovascular: Normal rate, regular rhythm, normal heart sounds and intact distal pulses.  No murmur heard. Pulmonary/Chest: Effort normal and breath sounds normal. No respiratory distress. She has no wheezes. She has no rales.  Musculoskeletal: She exhibits no edema.  See HPI for foot exam if done  Lymphadenopathy:    She has no cervical adenopathy.  Skin: Skin is warm and dry. No rash noted.  Psychiatric: She has a normal mood and affect.  Nursing note and vitals reviewed.  Results for orders placed or performed in visit on 11/07/17  POCT glycosylated hemoglobin (Hb A1C)  Result Value Ref Range   Hemoglobin A1C 10.5 (A) 4.0 - 5.6 %   HbA1c POC (<> result, manual entry)  4.0 - 5.6 %   HbA1c, POC (prediabetic range)   5.7 - 6.4 %   HbA1c, POC (controlled diabetic range)  0.0 - 7.0 %      Assessment & Plan:   Problem List Items Addressed This Visit    Type 2 diabetes, uncontrolled, with neuropathy ([place]) - Primary    Chronic, uncontrolled. Evidence of neuropathy on exam. Not checking sugars regularly. Encouraged closer adherence to diabetic diet, handout provided today. RTC 4 mo close f/u. Advised schedule CPE when she can as overdue.      Relevant Orders   POCT glycosylated hemoglobin (Hb A1C) (Completed)   HTN (hypertension)    Chronic, stable. Not convinced  ACEI induced cough as symptoms persist despite stopping med. Refilled today. Update if worsening cough.       Dyslipidemia    Continue simvastatin (more affordable than crestor, body aches to crestor).  The 10-year ASCVD risk score Mikey Bussing DC Brooke Bonito., et al., 2013) is: 10.4%   Values used to calculate the score:     Age: 42 years     Sex: Female     Is Non-Hispanic African American: No     Diabetic: Yes     Tobacco smoker: No     Systolic Blood Pressure: 881 mmHg     Is BP treated: Yes     HDL Cholesterol: 36.4 mg/dL     Total Cholesterol: 210 mg/dL       Diabetic retinopathy ([place])    Overdue for f/u - trouble affording.           Meds ordered this encounter  Medications  . gabapentin (NEURONTIN) 300 MG capsule    Sig: Take 1 capsule (300 mg total) by mouth at bedtime.    Dispense:  90 capsule    Refill:  3    This prescription was filled on 01/24/2017. Any refills authorized will be placed on file.  Marland Kitchen acetaminophen (TYLENOL 8 HOUR) 650 MG CR tablet    Sig: Take 2 tablets (1,300 mg total) by mouth 2 (two) times daily as needed for pain.   Orders Placed This Encounter  Procedures  . POCT glycosylated hemoglobin (Hb A1C)    Follow up plan: Return in about 4 months (around 03/09/2018), or if symptoms worsen or fail to improve, for follow up visit.  [name], MD

## 2017-11-11 ENCOUNTER — Other Ambulatory Visit: Payer: Self-pay | Admitting: Family Medicine

## 2017-11-15 ENCOUNTER — Other Ambulatory Visit: Payer: Self-pay | Admitting: Interventional Cardiology

## 2017-11-15 ENCOUNTER — Telehealth: Payer: Self-pay

## 2017-11-15 NOTE — Telephone Encounter (Addendum)
Received Lantus shipment (7 bxs) for pt.  Notified pt via MyChart per her request.   [Placed Lantus in refrigerator on Dr. Synthia Innocent side, 2nd shelf on door.  Use white plastic bag next to med to pack up and give to pt.]

## 2017-11-15 NOTE — Telephone Encounter (Signed)
Patient has not been seen here since 2017. Looks like this was stopped by another provider anyway. Can this be refused? Please advise. Thanks, MI

## 2017-11-15 NOTE — Telephone Encounter (Signed)
Refusing is fine.  Thanks!

## 2017-11-24 ENCOUNTER — Other Ambulatory Visit: Payer: Self-pay | Admitting: Family Medicine

## 2017-11-24 NOTE — Telephone Encounter (Signed)
Pt picked up shipment from our office today.

## 2017-11-25 ENCOUNTER — Other Ambulatory Visit: Payer: Self-pay | Admitting: Family Medicine

## 2017-11-27 ENCOUNTER — Other Ambulatory Visit: Payer: Self-pay | Admitting: Family Medicine

## 2017-11-27 ENCOUNTER — Encounter: Payer: Self-pay | Admitting: Family Medicine

## 2017-11-27 ENCOUNTER — Telehealth: Payer: Self-pay | Admitting: Family Medicine

## 2017-11-27 NOTE — Telephone Encounter (Signed)
Name of Medication: Tralmadol Name of Pharmacy:  [facility] or Written Date and Quantity: 09/29/17, #30 Last Office Visit and Type: 11/07/17, f/u Next Office Visit and Type: 03/12/18, f/u Last Controlled Substance Agreement Date: none Last UDS: none

## 2017-11-27 NOTE — Telephone Encounter (Signed)
Losartan is not on pt's current med list.

## 2017-11-27 NOTE — Telephone Encounter (Signed)
Note on refill from pharmacy: PATIENT PREFERS TO TAKE LOSARTAN 25MG  OVER LISINOPRIL 2.5MG . SHE ASKED THE PHARMACY TO REQUEST REFILLS FOR LOSARTAN 25MG . PLEASE ADVISE. THANK YOU.

## 2017-11-27 NOTE — Telephone Encounter (Signed)
Copied from [place] [id number]. Topic: Quick Communication - Rx Refill/Question >> Nov 27, 2017  2:45 PM Neva Seat wrote: traMADol (ULTRAM) 50 MG tablet Pt wants to go back on the Losartan due to other Rx gave pt a cough.  Needing refills!  Preferred Pharmacy    [facility], [place], [address] [place] [address] Phone: [phone] Fax: [phone]

## 2017-11-27 NOTE — Telephone Encounter (Signed)
See patient email from 11/27/17

## 2017-11-29 MED ORDER — LOSARTAN POTASSIUM 25 MG PO TABS: 25.0000 mg | ORAL_TABLET | Freq: Every day | ORAL | 1 refills | Status: DC

## 2017-11-29 MED ORDER — TRAMADOL HCL 50 MG PO TABS: 50.0000 mg | ORAL_TABLET | Freq: Every day | ORAL | 0 refills | Status: DC | PRN

## 2017-11-29 NOTE — Telephone Encounter (Signed)
Refilled both.

## 2017-12-18 ENCOUNTER — Telehealth: Payer: Self-pay

## 2017-12-18 MED ORDER — CITALOPRAM HYDROBROMIDE 20 MG PO TABS: 20.0000 mg | ORAL_TABLET | Freq: Every day | ORAL | 2 refills | Status: DC

## 2017-12-18 MED ORDER — GLIPIZIDE 5 MG PO TABS: 2.5000 mg | ORAL_TABLET | Freq: Two times a day (BID) | ORAL | 0 refills | Status: DC

## 2017-12-18 MED ORDER — OMEPRAZOLE 40 MG PO CPDR: DELAYED_RELEASE_CAPSULE | ORAL | 0 refills | Status: DC

## 2017-12-18 NOTE — Telephone Encounter (Signed)
Received faxed refill request from [facility] for citalopram, omeprazole and glipizide.  Sent refills.

## 2017-12-22 ENCOUNTER — Other Ambulatory Visit: Payer: Self-pay | Admitting: Family Medicine

## 2017-12-22 ENCOUNTER — Encounter: Payer: Self-pay | Admitting: Family Medicine

## 2017-12-22 DIAGNOSIS — E785 Hyperlipidemia, unspecified: Secondary | ICD-10-CM

## 2017-12-22 MED ORDER — NITROGLYCERIN 0.4 MG SL SUBL: 0.4000 mg | SUBLINGUAL_TABLET | SUBLINGUAL | 0 refills | Status: DC | PRN

## 2017-12-22 MED ORDER — METFORMIN HCL 1000 MG PO TABS: 1000.0000 mg | ORAL_TABLET | Freq: Two times a day (BID) | ORAL | 0 refills | Status: DC

## 2017-12-22 NOTE — Telephone Encounter (Signed)
Name of Medication: Tramadol 50mg  Name of Pharmacy: [facility] or Written Date and Quantity: 11-29-17 #30  Last Office Visit and Type: 11-07-17 DM F/U Next Office Visit and Type: 03-12-18

## 2017-12-24 MED ORDER — TRAMADOL HCL 50 MG PO TABS: 50.0000 mg | ORAL_TABLET | Freq: Every day | ORAL | 0 refills | Status: DC | PRN

## 2017-12-24 NOTE — Telephone Encounter (Signed)
Sent. Thanks.   

## 2017-12-26 MED ORDER — INSULIN REGULAR HUMAN 100 UNIT/ML IJ SOLN: 10.0000 [IU] | Freq: Three times a day (TID) | INTRAMUSCULAR | 11 refills | Status: DC

## 2017-12-26 NOTE — Addendum Note (Signed)
Addended by: [name] on: 12/26/2017 07:42 AM   Modules accepted: Orders

## 2018-01-06 ENCOUNTER — Other Ambulatory Visit: Payer: Self-pay | Admitting: Family Medicine

## 2018-01-10 ENCOUNTER — Emergency Department (HOSPITAL_COMMUNITY)
Admission: EM | Admit: 2018-01-10 | Discharge: 2018-01-10 | Disposition: A | Discharge status: Home / Self Care | Payer: Self-pay | Attending: Emergency Medicine | Admitting: Emergency Medicine

## 2018-01-10 ENCOUNTER — Encounter (HOSPITAL_COMMUNITY): Payer: Self-pay | Admitting: Emergency Medicine

## 2018-01-10 ENCOUNTER — Emergency Department (HOSPITAL_COMMUNITY): Payer: Self-pay

## 2018-01-10 ENCOUNTER — Other Ambulatory Visit: Payer: Self-pay

## 2018-01-10 DIAGNOSIS — Z7982 Long term (current) use of aspirin: Secondary | ICD-10-CM | POA: Insufficient documentation

## 2018-01-10 DIAGNOSIS — Z79899 Other long term (current) drug therapy: Secondary | ICD-10-CM | POA: Insufficient documentation

## 2018-01-10 DIAGNOSIS — Z794 Long term (current) use of insulin: Secondary | ICD-10-CM | POA: Insufficient documentation

## 2018-01-10 DIAGNOSIS — R1011 Right upper quadrant pain: Secondary | ICD-10-CM | POA: Insufficient documentation

## 2018-01-10 DIAGNOSIS — I2510 Atherosclerotic heart disease of native coronary artery without angina pectoris: Secondary | ICD-10-CM | POA: Insufficient documentation

## 2018-01-10 DIAGNOSIS — E119 Type 2 diabetes mellitus without complications: Secondary | ICD-10-CM | POA: Insufficient documentation

## 2018-01-10 DIAGNOSIS — R109 Unspecified abdominal pain: Secondary | ICD-10-CM

## 2018-01-10 LAB — LIPASE, BLOOD: Lipase: 30 U/L (ref 11–51)

## 2018-01-10 LAB — URINALYSIS, ROUTINE W REFLEX MICROSCOPIC
Bilirubin Urine: NEGATIVE
Glucose, UA: >=500 mg/dL — AB
Hgb urine dipstick: NEGATIVE
KETONES UR: NEGATIVE mg/dL
Leukocytes, UA: NEGATIVE
Nitrite: NEGATIVE
PROTEIN: NEGATIVE mg/dL
Specific Gravity, Urine: 1.024 (ref 1.005–1.030)
pH: 5 (ref 5.0–8.0)

## 2018-01-10 LAB — CBC
HCT: 41.4 % (ref 36.0–46.0)
HEMOGLOBIN: 13.1 g/dL (ref 12.0–15.0)
MCH: 28.1 pg (ref 26.0–34.0)
MCHC: 31.6 g/dL (ref 30.0–36.0)
MCV: 88.7 fL (ref 78.0–100.0)
Platelets: 142 10*3/uL — ABNORMAL LOW (ref 150–400)
RBC: 4.67 MIL/uL (ref 3.87–5.11)
RDW: 13.3 % (ref 11.5–15.5)
WBC: 4.4 10*3/uL (ref 4.0–10.5)

## 2018-01-10 LAB — COMPREHENSIVE METABOLIC PANEL
ALBUMIN: 3.6 g/dL (ref 3.5–5.0)
ALT: 38 U/L (ref 0–44)
ANION GAP: 13 (ref 5–15)
AST: 55 U/L — ABNORMAL HIGH (ref 15–41)
Alkaline Phosphatase: 61 U/L (ref 38–126)
BUN: 12 mg/dL (ref 6–20)
CO2: 25 mmol/L (ref 22–32)
Calcium: 9.1 mg/dL (ref 8.9–10.3)
Chloride: 101 mmol/L (ref 98–111)
Creatinine, Ser: 0.7 mg/dL (ref 0.44–1.00)
GFR calc Af Amer: >60 mL/min (ref >60)
GFR calc non Af Amer: >60 mL/min (ref >60)
GLUCOSE: 336 mg/dL — AB (ref 70–99)
Potassium: 4.2 mmol/L (ref 3.5–5.1)
SODIUM: 139 mmol/L (ref 135–145)
Total Bilirubin: 0.8 mg/dL (ref 0.3–1.2)
Total Protein: 6.5 g/dL (ref 6.5–8.1)

## 2018-01-10 NOTE — ED Provider Notes (Signed)
[facility] EMERGENCY DEPARTMENT Provider Note   CSN: [id number] Arrival date & time: 01/10/18  1845     History   Chief Complaint Chief Complaint  Patient presents with  . Flank Pain  . Abdominal Pain    HPI Karen [name] is a 62 y.o. female.  HPI Patient is a 40-year-old female who presents the emergency department intermittent right flank pain with some radiation towards her right upper abdomen and her right lower abdomen.  She reports some urinary frequency without dysuria.  She denies vaginal complaints.  No fevers or chills.  Denies nausea vomiting or diarrhea.  Her symptoms are intermittent.  No history of kidney stones.  Currently without significant pain.  No other associated symptoms.  No chest pain or shortness of breath.  No cough.  Symptoms are mild to moderate in severity when her pain is present.   Past Medical History:  Diagnosis Date  . CAD (coronary artery disease)    STEMI 07/2013 - treated with PTCA of the RCA followed by DES to the intermediate ramus  //  Myoview 5/17 - Normal perfusion. LVEF 71% with normal wall motion. This is a low risk study.  . Chronic bronchitis   . Depression   . GERD (gastroesophageal reflux disease)   . High blood pressure   . History of chicken pox   . History of ovarian cyst   . HLD (hyperlipidemia)   . Seasonal allergies   . Seizure ([place]) 1977   off AEDs  . SVT (supraventricular tachycardia) ([place]) 10/2015   s/p catheter ablation  . Syncopal episodes   . Tachyarrhythmia    ER visits with adenosine push to control  . Tubal pregnancy   . Type 2 diabetes, uncontrolled, with neuropathy ([place])    dx 1993    Patient Active Problem List   Diagnosis Date Noted  . Body aches 03/24/2016  . Right arm numbness 03/24/2016  . BPPV (benign paroxysmal positional vertigo) 01/11/2016  . Cough due to ACE inhibitor 12/22/2015  . SVT (supraventricular tachycardia) ([place]) 11/06/2015  . Paroxysmal SVT (supraventricular  tachycardia) ([place]) 09/15/2015  . Pain, dental 05/14/2014  . CMC arthritis, thumb, degenerative 02/21/2014  . Sprain of left thumb 02/06/2014  . Diabetic retinopathy ([place]) 12/31/2013  . Dyspnea 10/01/2013  . CAD S/P percutaneous coronary angioplasty 07/23/2013  . Health maintenance examination 06/10/2013  . Vertigo 04/25/2013  . Lumbar radiculopathy 03/16/2011  . Depression   . Type 2 diabetes, uncontrolled, with neuropathy ([place])   . GERD (gastroesophageal reflux disease)   . Seasonal allergies   . HTN (hypertension)   . Dyslipidemia     Past Surgical History:  Procedure Laterality Date  . ABLATION OF DYSRHYTHMIC FOCUS  11/06/2015   SVT  . CARDIAC CATHETERIZATION  07/2013   angioplasty to RCA and DES to ramus intermedius  . DILATION AND CURETTAGE OF UTERUS  1984  . ECTOPIC PREGNANCY SURGERY Right 1984   tube removal 2/2 hemorrhage  . ELECTROPHYSIOLOGIC STUDY N/A 11/06/2015   SVT Ablation;  Evans Lance, MD;  Location: [facility] LAB  . LEFT HEART CATHETERIZATION WITH CORONARY ANGIOGRAM N/A 07/23/2013   Procedure: LEFT HEART CATHETERIZATION WITH CORONARY ANGIOGRAM;  Surgeon: Sinclair Grooms, MD;  Location: [facility] CATH [facility];  Service: Cardiovascular;  Laterality: N/A;  . OVARIAN CYST SURGERY  1998  . PERCUTANEOUS CORONARY STENT INTERVENTION (PCI-S) N/A 07/24/2013   Procedure: PERCUTANEOUS CORONARY STENT INTERVENTION (PCI-S);  Surgeon: Sinclair Grooms, MD;  Location: [facility] CATH LAB;  Service: Cardiovascular;  Laterality: N/A;  . ROTATOR CUFF REPAIR  2006   Right  . SHOULDER ARTHROSCOPY Left 02/2017   LEFT shoulder arthroscopy with limited debridement, decompression/acromioplasty, open rotator cuff repair and biceps tenodesis ([facility])  . SUBMANDIBULAR DUCT LIGATION Right 10/2014   R submandibular gland stone removed [facility])  . TONSILLECTOMY AND ADENOIDECTOMY  1965     OB History   None      Home Medications    Prior to Admission medications   Medication Sig Start Date  End Date Taking? Authorizing Provider  acetaminophen (TYLENOL 8 HOUR) 650 MG CR tablet Take 2 tablets (1,300 mg total) by mouth 2 (two) times daily as needed for pain. 11/07/17   [name], MD  aspirin EC 81 MG EC tablet Take 1 tablet (81 mg total) by mouth daily. 07/25/13   Sueanne Margarita, MD  citalopram (CELEXA) 20 MG tablet Take 1 tablet (20 mg total) by mouth daily. 12/18/17   [name], MD  furosemide (LASIX) 20 MG tablet Take 1 tablet (20 mg total) by mouth daily as needed for edema. Patient not taking: Reported on 11/07/2017 05/02/16   [name], MD  gabapentin (NEURONTIN) 300 MG capsule Take 1 capsule (300 mg total) by mouth at bedtime. 11/07/17   [name], MD  glipiZIDE (GLUCOTROL) 5 MG tablet Take 0.5-1 tablets (2.5-5 mg total) by mouth 2 (two) times daily. Depending on meal size 12/18/17   [name], MD  Insulin Glargine (LANTUS SOLOSTAR) 100 UNIT/ML Solostar Pen Inject 60 Units into the skin 2 (two) times daily. QS 1 mo 07/16/15   [name], MD  Insulin Pen Needle (PEN NEEDLES) 31G X 8 MM MISC Use to inject Lantus as directed twice daily. Dx: E11.40 08/18/15   [name], MD  insulin regular (NOVOLIN R) 100 units/mL injection Inject 0.1 mLs (10 Units total) into the skin 3 (three) times daily before meals. 12/26/17   [name], MD  KLOR-CON SPRINKLE 10 MEQ CR capsule Take 1 capsule (10 mEq total) by mouth daily as needed. When taking Lasix Patient not taking: Reported on 11/07/2017 05/02/16   [name], MD  Loperamide HCl (IMODIUM A-D PO) Take 1-2 tablets by mouth daily as needed (for stomach).     [provider]  losartan (COZAAR) 25 MG tablet Take 1 tablet (25 mg total) by mouth daily. 11/29/17   [name], MD  metFORMIN (GLUCOPHAGE) 1000 MG tablet Take 1 tablet (1,000 mg total) by mouth 2 (two) times daily with a meal. 12/22/17   [name], MD  nitroGLYCERIN (NITROSTAT) 0.4 MG SL tablet Place 1 tablet  (0.4 mg total) under the tongue every 5 (five) minutes as needed for chest pain. 12/22/17   [name], MD  omeprazole (PRILOSEC) 40 MG capsule 1 capsule Once a day Orally 90 days 12/18/17   [name], MD  simvastatin (ZOCOR) 40 MG tablet TAKE 1 TABLET (40 MG TOTAL) BY MOUTH DAILY. 01/08/18   [name], MD  traMADol (ULTRAM) 50 MG tablet Take 1 tablet (50 mg total) by mouth daily as needed for moderate pain. 12/24/17   Tonia Ghent, MD    Family History Family History  Problem Relation Age of Onset  . Diabetes Mother   . Hyperlipidemia Mother   . Hypertension Mother   . Stroke Mother   . Coronary artery disease Mother        CABG  . Heart disease Mother   . Diabetes  Father   . Hypertension Father   . Hyperlipidemia Father   . Coronary artery disease Father        CABG  . Congestive Heart Failure Father   . Pancreatic cancer Father   . Arrhythmia Brother   . Diabetes Maternal Grandmother   . Diabetes Maternal Grandfather   . Diabetes Paternal Grandmother   . Diabetes Paternal Grandfather   . Heart disease Brother   . Kidney disease Neg Hx     Social History Social History   Tobacco Use  . Smoking status: Never Smoker  . Smokeless tobacco: Never Used  Substance Use Topics  . Alcohol use: No  . Drug use: No     Allergies   Clindamycin/lincomycin; Erythromycin; Lipitor [atorvastatin]; and Penicillins   Review of Systems Review of Systems  All other systems reviewed and are negative.    Physical Exam Updated Vital Signs BP (!) 144/69 (BP Location: Right Arm)   Pulse 81   Temp 98.4 F (36.9 C) (Oral)   Resp 14   Ht 5\' 1"  (1.549 m)   Wt 82.6 kg   SpO2 94%   BMI 34.39 kg/m   Physical Exam  Constitutional: She is oriented to person, place, and time. She appears well-developed and well-nourished. No distress.  HENT:  Head: Normocephalic and atraumatic.  Eyes: EOM are normal.  Neck: Normal range of motion.  Cardiovascular: Normal  rate, regular rhythm and normal heart sounds.  Pulmonary/Chest: Effort normal and breath sounds normal.  Abdominal: Soft. She exhibits no distension. There is no tenderness.  Significant abdominal tenderness.  No rash over her abdomen  Musculoskeletal: Normal range of motion.  Mild paralumbar and right flank tenderness without rash.  Neurological: She is alert and oriented to person, place, and time.  Skin: Skin is warm and dry.  Psychiatric: She has a normal mood and affect. Judgment normal.  Nursing note and vitals reviewed.    ED Treatments / Results  Labs (all labs ordered are listed, but only abnormal results are displayed) Labs Reviewed  COMPREHENSIVE METABOLIC PANEL - Abnormal; Notable for the following components:      Result Value   Glucose, Bld 336 (*)    AST 55 (*)    All other components within normal limits  CBC - Abnormal; Notable for the following components:   Platelets 142 (*)    All other components within normal limits  URINALYSIS, ROUTINE W REFLEX MICROSCOPIC - Abnormal; Notable for the following components:   Glucose, UA >=500 (*)    Bacteria, UA RARE (*)    All other components within normal limits  URINE CULTURE  LIPASE, BLOOD    EKG None  Radiology US Abdomen Complete  Result Date: 01/10/2018 CLINICAL DATA:  Right side abdominal pain EXAM: ABDOMEN ULTRASOUND COMPLETE COMPARISON:  None. FINDINGS: Gallbladder: 7 mm polyp along the posterior gallbladder wall. No stones, wall thickening or sonographic Murphy's sign. Common bile duct: Diameter: Normal caliber, 3 mm Liver: Increased echotexture compatible with fatty infiltration. No focal abnormality or biliary ductal dilatation. Portal vein is patent on color Doppler imaging with normal direction of blood flow towards the liver. IVC: No abnormality visualized. Pancreas: Visualized portion unremarkable. Spleen: Size and appearance within normal limits. Right Kidney: Length: 11.5 cm. Echogenicity within normal  limits. No mass or hydronephrosis visualized. Left Kidney: Length: 12.9 cm. Echogenicity within normal limits. No mass or hydronephrosis visualized. Abdominal aorta: No aneurysm visualized. Other findings: None. IMPRESSION: Mild fatty infiltration of the liver. Small gallbladder  wall polyp. No stones or evidence of acute cholecystitis. No evidence of hydronephrosis. No acute findings. Electronically Signed   By: Rolm Baptise M.D.   On: 01/10/2018 22:39    Procedures Procedures (including critical care time)  Medications Ordered in ED Medications - No data to display   Initial Impression / Assessment and Plan / ED Course  I have reviewed the triage vital signs and the nursing notes.  Pertinent labs & imaging results that were available during my care of the patient were reviewed by me and considered in my medical decision making (see chart for details).     No signs of zoster at this time.  Lab work is reassuring.  Urine shows no signs of urinary tract infection.  Patient does not want any medication for pain at this time.  Ultrasound of her abdomen demonstrates no cholelithiasis.  No hydronephrosis.  No clear etiology for the patient's symptoms.  Close primary care follow-up.  Patient is instructed to follow-up in the emergency department for new or worsening symptoms    Final Clinical Impressions(s) / ED Diagnoses   Final diagnoses:  Acute right flank pain  Acute abdominal pain    ED Discharge Orders    None       Jola Schmidt, MD 01/10/18 2311

## 2018-01-10 NOTE — ED Provider Notes (Signed)
Patient placed in Quick Look pathway, seen and evaluated   Chief Complaint: UTI symptoms  HPI:   Karen [name] is a 62 y.o. female who presents to the ED with dysuria, frequency and urgency. Right flank pain that radiates to the right lower abdomen.   ROS: GU: dysuria, frequency and urgency  Physical Exam:  BP (!) 144/69 (BP Location: Right Arm)   Pulse 81   Temp 98.4 F (36.9 C) (Oral)   Resp 14   SpO2 94%    Gen: No distress  Neuro: Awake and Alert  Skin: Warm and dry  Abdomen: soft, tender with palpation suprapubic no CVAT     Initiation of care has begun. The patient has been counseled on the process, plan, and necessity for staying for the completion/evaluation, and the remainder of the medical screening examination    Ashley Murrain, NP 01/10/18 Kennon Portela, MD 01/11/18 [phone]

## 2018-01-10 NOTE — ED Triage Notes (Signed)
C/o intermittent sharp R flank pain that radiates to R abd x 1 week.  Pt thinks she may have a UTI and states she just doesn't feel good.

## 2018-01-12 LAB — URINE CULTURE

## 2018-01-23 ENCOUNTER — Telehealth: Payer: Self-pay

## 2018-01-23 NOTE — Telephone Encounter (Signed)
Received faxed Pt Assistance Program Refill Request from [facility].  Completed form and faxed to [phone]. Phn #  [phone].

## 2018-02-01 ENCOUNTER — Telehealth: Payer: Self-pay

## 2018-02-01 NOTE — Telephone Encounter (Addendum)
Pt's Lantus shipment was delivered today.  Sent pt a MyChart message to let her know.  [Placed Lantus (7 bxs) in vaccine refrigerator on the door, 2nd shelf. Blue plastic bag is available to pack up for pt.]

## 2018-02-07 ENCOUNTER — Other Ambulatory Visit: Payer: Self-pay | Admitting: Family Medicine

## 2018-02-07 NOTE — Telephone Encounter (Signed)
Name of Medication: Tramadol Name of Pharmacy: [facility] or Written Date and Quantity: 12/25/17, #30 Last Office Visit and Type: 11/07/17, f/u Next Office Visit and Type: 03/12/18, f/u Last Controlled Substance Agreement Date: none Last UDS: none

## 2018-02-09 NOTE — Telephone Encounter (Signed)
Eprescribed.

## 2018-03-04 ENCOUNTER — Other Ambulatory Visit: Payer: Self-pay | Admitting: Family Medicine

## 2018-03-06 NOTE — Telephone Encounter (Signed)
Electronic refill request Celexa Last refill 12/18/17 #30/2 Last office visit 11/07/17 See drug warning with Omeprazole

## 2018-03-12 ENCOUNTER — Ambulatory Visit: Payer: Self-pay | Admitting: Family Medicine

## 2018-03-28 ENCOUNTER — Other Ambulatory Visit: Payer: Self-pay | Admitting: Family Medicine

## 2018-03-28 NOTE — Telephone Encounter (Signed)
Name of Medication: Tramadol Name of Pharmacy: [facility] or Written Date and Quantity: 02/09/18, #30/0 Last Office Visit and Type: 11/07/17, f/u Next Office Visit and Type: 04/09/18, DM f/u Last Controlled Substance Agreement Date: none Last UDS: none

## 2018-03-30 NOTE — Telephone Encounter (Signed)
Eprescribed.

## 2018-04-09 ENCOUNTER — Ambulatory Visit: Payer: Self-pay | Admitting: Family Medicine

## 2018-04-19 ENCOUNTER — Telehealth: Payer: Self-pay

## 2018-04-19 NOTE — Telephone Encounter (Addendum)
Received pt's Lantus shipment (7 boxes) today.  Notified pt via [place].  [Boxes (7 total) are in "Flu vaccine" refrigerator on the top shelf on the door. Use white '[facility]' bag, located with the Lantus in refrigerator, to package to give to pt.]

## 2018-04-25 ENCOUNTER — Other Ambulatory Visit: Payer: Self-pay | Admitting: Family Medicine

## 2018-04-26 NOTE — Telephone Encounter (Signed)
Pt picked up Lantus

## 2018-04-30 ENCOUNTER — Other Ambulatory Visit: Payer: Self-pay | Admitting: Family Medicine

## 2018-04-30 NOTE — Telephone Encounter (Addendum)
Electronic refill request Omeprazole Last refill 12/18/17 #90 Last office visit 11/07/17 See drug warning with Citalopram

## 2018-05-02 MED ORDER — PANTOPRAZOLE SODIUM 40 MG PO TBEC: 40.0000 mg | DELAYED_RELEASE_TABLET | Freq: Every day | ORAL | 3 refills | Status: DC

## 2018-05-02 NOTE — Telephone Encounter (Signed)
plz call - need to change from omeprazole to pantoprazole 40mg  daily for GERD given omeprazole interacts with celexa.

## 2018-05-02 NOTE — Telephone Encounter (Signed)
Sent pt a MyChart message relaying Dr. Synthia Innocent message.

## 2018-05-06 ENCOUNTER — Encounter: Payer: Self-pay | Admitting: Family Medicine

## 2018-05-06 NOTE — Progress Notes (Deleted)
There were no vitals taken for this visit.   CC: *** Subjective:    Patient ID: Karen [name], female    DOB: August 30, 1955, 62 y.o.   MRN: [id number]  HPI: Karen [name] is a 62 y.o. female presenting on 05/07/2018 for No chief complaint on file.   ***  Relevant past medical, surgical, family and social history reviewed and updated as indicated. Interim medical history since our last visit reviewed. Allergies and medications reviewed and updated. Outpatient Medications Prior to Visit  Medication Sig Dispense Refill  . acetaminophen (TYLENOL 8 HOUR) 650 MG CR tablet Take 2 tablets (1,300 mg total) by mouth 2 (two) times daily as needed for pain.    Marland Kitchen aspirin EC 81 MG EC tablet Take 1 tablet (81 mg total) by mouth daily.    . citalopram (CELEXA) 20 MG tablet Take 1 tablet (20 mg total) by mouth daily. 30 tablet 6  . furosemide (LASIX) 20 MG tablet Take 1 tablet (20 mg total) by mouth daily as needed for edema. (Patient not taking: Reported on 11/07/2017) 30 tablet 3  . gabapentin (NEURONTIN) 300 MG capsule Take 1 capsule (300 mg total) by mouth at bedtime. 90 capsule 3  . glipiZIDE (GLUCOTROL) 5 MG tablet Take 0.5-1 tablets (2.5-5 mg total) by mouth 2 (two) times daily. Depending on meal size 180 tablet 0  . Insulin Glargine (LANTUS SOLOSTAR) 100 UNIT/ML Solostar Pen Inject 60 Units into the skin 2 (two) times daily. QS 1 mo 12 pen 11  . Insulin Pen Needle (PEN NEEDLES) 31G X 8 MM MISC Use to inject Lantus as directed twice daily. Dx: E11.40 60 each 3  . insulin regular (NOVOLIN R) 100 units/mL injection Inject 0.1 mLs (10 Units total) into the skin 3 (three) times daily before meals. 10 mL 11  . KLOR-CON SPRINKLE 10 MEQ CR capsule Take 1 capsule (10 mEq total) by mouth daily as needed. When taking Lasix (Patient not taking: Reported on 11/07/2017) 30 capsule 3  . Loperamide HCl (IMODIUM A-D PO) Take 1-2 tablets by mouth daily as needed (for stomach).     . losartan (COZAAR) 25 MG tablet Take  1 tablet (25 mg total) by mouth daily. 90 tablet 1  . metFORMIN (GLUCOPHAGE) 1000 MG tablet Take 1 tablet (1,000 mg total) by mouth 2 (two) times daily with a meal. 180 tablet 0  . nitroGLYCERIN (NITROSTAT) 0.4 MG SL tablet Place 1 tablet (0.4 mg total) under the tongue every 5 (five) minutes as needed for chest pain. 25 tablet 0  . pantoprazole (PROTONIX) 40 MG tablet Take 1 tablet (40 mg total) by mouth daily. 30 tablet 3  . simvastatin (ZOCOR) 40 MG tablet TAKE 1 TABLET (40 MG TOTAL) BY MOUTH DAILY. 30 tablet 3  . traMADol (ULTRAM) 50 MG tablet Take 1 tablet (50 mg total) by mouth daily as needed for moderate pain. 30 tablet 0   No facility-administered medications prior to visit.      Per HPI unless specifically indicated in ROS section below Review of Systems     Objective:    There were no vitals taken for this visit.  Wt Readings from Last 3 Encounters:  01/10/18 182 lb (82.6 kg)  11/07/17 181 lb 8 oz (82.3 kg)  05/17/17 178 lb (80.7 kg)    Physical Exam Results for orders placed or performed during the hospital encounter of 01/10/18  Urine culture  Result Value Ref Range   Specimen Description URINE, CLEAN CATCH  Special Requests      NONE Performed at [facility], [place] [address], [place], [place] [address]    Culture MULTIPLE SPECIES PRESENT, SUGGEST RECOLLECTION (A)    Report Status 01/12/2018 FINAL   Lipase, blood  Result Value Ref Range   Lipase 30 11 - 51 U/L  Comprehensive metabolic panel  Result Value Ref Range   Sodium 139 135 - 145 mmol/L   Potassium 4.2 3.5 - 5.1 mmol/L   Chloride 101 98 - 111 mmol/L   CO2 25 22 - 32 mmol/L   Glucose, Bld 336 (H) 70 - 99 mg/dL   BUN 12 6 - 20 mg/dL   Creatinine, Ser 0.70 0.44 - 1.00 mg/dL   Calcium 9.1 8.9 - 10.3 mg/dL   Total Protein 6.5 6.5 - 8.1 g/dL   Albumin 3.6 3.5 - 5.0 g/dL   AST 55 (H) 15 - 41 U/L   ALT 38 0 - 44 U/L   Alkaline Phosphatase 61 38 - 126 U/L   Total Bilirubin 0.8 0.3 - 1.2 mg/dL    GFR calc non Af Amer >60 >60 mL/min   GFR calc Af Amer >60 >60 mL/min   Anion gap 13 5 - 15  CBC  Result Value Ref Range   WBC 4.4 4.0 - 10.5 K/uL   RBC 4.67 3.87 - 5.11 MIL/uL   Hemoglobin 13.1 12.0 - 15.0 g/dL   HCT 41.4 36.0 - 46.0 %   MCV 88.7 78.0 - 100.0 fL   MCH 28.1 26.0 - 34.0 pg   MCHC 31.6 30.0 - 36.0 g/dL   RDW 13.3 11.5 - 15.5 %   Platelets 142 (L) 150 - 400 K/uL  Urinalysis, Routine w reflex microscopic  Result Value Ref Range   Color, Urine YELLOW YELLOW   APPearance CLEAR CLEAR   Specific Gravity, Urine 1.024 1.005 - 1.030   pH 5.0 5.0 - 8.0   Glucose, UA >=500 (A) NEGATIVE mg/dL   Hgb urine dipstick NEGATIVE NEGATIVE   Bilirubin Urine NEGATIVE NEGATIVE   Ketones, ur NEGATIVE NEGATIVE mg/dL   Protein, ur NEGATIVE NEGATIVE mg/dL   Nitrite NEGATIVE NEGATIVE   Leukocytes, UA NEGATIVE NEGATIVE   RBC / HPF 0-5 0 - 5 RBC/hpf   WBC, UA 0-5 0 - 5 WBC/hpf   Bacteria, UA RARE (A) NONE SEEN   Squamous Epithelial / LPF 0-5 0 - 5   Mucus PRESENT    Hyaline Casts, UA PRESENT       Assessment & Plan:   Problem List Items Addressed This Visit    None       No orders of the defined types were placed in this encounter.  No orders of the defined types were placed in this encounter.   Follow up plan: No follow-ups on file.  [name], MD

## 2018-05-07 ENCOUNTER — Ambulatory Visit: Payer: Self-pay | Admitting: Family Medicine

## 2018-05-16 ENCOUNTER — Encounter: Payer: Self-pay | Admitting: Family Medicine

## 2018-05-16 NOTE — Progress Notes (Deleted)
There were no vitals taken for this visit.   CC: DM f/u visit Subjective:    Patient ID: Karen [name], female    DOB: 04-Jan-1956, 63 y.o.   MRN: [id number]  HPI: Karen [name] is a 63 y.o. female presenting on 05/17/2018 for No chief complaint on file.   Self pay patient.   DM - does *** regularly check sugars ***. Compliant with antihyperglycemic regimen which includes: glipizide 5mg  bid, lantus 60u bid, metformin 1000mg  bid. Denies low sugars or hypoglycemic symptoms. Denies paresthesias. Last diabetic eye exam DUE, unable due to cost. Pneumovax: 2015. Prevnar: not due. Glucometer brand: accucheck guide. DSME: unable to afford. Lab Results  Component Value Date   HGBA1C 10.5 (A) 11/07/2017   Diabetic Foot Exam - Simple   No data filed     Lab Results  Component Value Date   MICROALBUR 3.3 (H) 07/02/2015         Relevant past medical, surgical, family and social history reviewed and updated as indicated. Interim medical history since our last visit reviewed. Allergies and medications reviewed and updated. Outpatient Medications Prior to Visit  Medication Sig Dispense Refill  . acetaminophen (TYLENOL 8 HOUR) 650 MG CR tablet Take 2 tablets (1,300 mg total) by mouth 2 (two) times daily as needed for pain.    Marland Kitchen aspirin EC 81 MG EC tablet Take 1 tablet (81 mg total) by mouth daily.    . citalopram (CELEXA) 20 MG tablet Take 1 tablet (20 mg total) by mouth daily. 30 tablet 6  . furosemide (LASIX) 20 MG tablet Take 1 tablet (20 mg total) by mouth daily as needed for edema. (Patient not taking: Reported on 11/07/2017) 30 tablet 3  . gabapentin (NEURONTIN) 300 MG capsule Take 1 capsule (300 mg total) by mouth at bedtime. 90 capsule 3  . glipiZIDE (GLUCOTROL) 5 MG tablet Take 0.5-1 tablets (2.5-5 mg total) by mouth 2 (two) times daily. Depending on meal size 180 tablet 0  . Insulin Glargine (LANTUS SOLOSTAR) 100 UNIT/ML Solostar Pen Inject 60 Units into the skin 2 (two) times daily. QS  1 mo 12 pen 11  . Insulin Pen Needle (PEN NEEDLES) 31G X 8 MM MISC Use to inject Lantus as directed twice daily. Dx: E11.40 60 each 3  . insulin regular (NOVOLIN R) 100 units/mL injection Inject 0.1 mLs (10 Units total) into the skin 3 (three) times daily before meals. 10 mL 11  . KLOR-CON SPRINKLE 10 MEQ CR capsule Take 1 capsule (10 mEq total) by mouth daily as needed. When taking Lasix (Patient not taking: Reported on 11/07/2017) 30 capsule 3  . Loperamide HCl (IMODIUM A-D PO) Take 1-2 tablets by mouth daily as needed (for stomach).     . losartan (COZAAR) 25 MG tablet Take 1 tablet (25 mg total) by mouth daily. 90 tablet 1  . metFORMIN (GLUCOPHAGE) 1000 MG tablet Take 1 tablet (1,000 mg total) by mouth 2 (two) times daily with a meal. 180 tablet 0  . nitroGLYCERIN (NITROSTAT) 0.4 MG SL tablet Place 1 tablet (0.4 mg total) under the tongue every 5 (five) minutes as needed for chest pain. 25 tablet 0  . pantoprazole (PROTONIX) 40 MG tablet Take 1 tablet (40 mg total) by mouth daily. 30 tablet 3  . simvastatin (ZOCOR) 40 MG tablet TAKE 1 TABLET (40 MG TOTAL) BY MOUTH DAILY. 30 tablet 3  . traMADol (ULTRAM) 50 MG tablet Take 1 tablet (50 mg total) by mouth daily as needed  for moderate pain. 30 tablet 0   No facility-administered medications prior to visit.      Per HPI unless specifically indicated in ROS section below Review of Systems Objective:    There were no vitals taken for this visit.  Wt Readings from Last 3 Encounters:  01/10/18 182 lb (82.6 kg)  11/07/17 181 lb 8 oz (82.3 kg)  05/17/17 178 lb (80.7 kg)    Physical Exam    Results for orders placed or performed during the hospital encounter of 01/10/18  Urine culture  Result Value Ref Range   Specimen Description URINE, CLEAN CATCH    Special Requests      NONE Performed at [facility], [place] [address] [place], [place] [address]    Culture MULTIPLE SPECIES PRESENT, SUGGEST RECOLLECTION (A)    Report Status  01/12/2018 FINAL   Lipase, blood  Result Value Ref Range   Lipase 30 11 - 51 U/L  Comprehensive metabolic panel  Result Value Ref Range   Sodium 139 135 - 145 mmol/L   Potassium 4.2 3.5 - 5.1 mmol/L   Chloride 101 98 - 111 mmol/L   CO2 25 22 - 32 mmol/L   Glucose, Bld 336 (H) 70 - 99 mg/dL   BUN 12 6 - 20 mg/dL   Creatinine, Ser 0.70 0.44 - 1.00 mg/dL   Calcium 9.1 8.9 - 10.3 mg/dL   Total Protein 6.5 6.5 - 8.1 g/dL   Albumin 3.6 3.5 - 5.0 g/dL   AST 55 (H) 15 - 41 U/L   ALT 38 0 - 44 U/L   Alkaline Phosphatase 61 38 - 126 U/L   Total Bilirubin 0.8 0.3 - 1.2 mg/dL   GFR calc non Af Amer >60 >60 mL/min   GFR calc Af Amer >60 >60 mL/min   Anion gap 13 5 - 15  CBC  Result Value Ref Range   WBC 4.4 4.0 - 10.5 K/uL   RBC 4.67 3.87 - 5.11 MIL/uL   Hemoglobin 13.1 12.0 - 15.0 g/dL   HCT 41.4 36.0 - 46.0 %   MCV 88.7 78.0 - 100.0 fL   MCH 28.1 26.0 - 34.0 pg   MCHC 31.6 30.0 - 36.0 g/dL   RDW 13.3 11.5 - 15.5 %   Platelets 142 (L) 150 - 400 K/uL  Urinalysis, Routine w reflex microscopic  Result Value Ref Range   Color, Urine YELLOW YELLOW   APPearance CLEAR CLEAR   Specific Gravity, Urine 1.024 1.005 - 1.030   pH 5.0 5.0 - 8.0   Glucose, UA >=500 (A) NEGATIVE mg/dL   Hgb urine dipstick NEGATIVE NEGATIVE   Bilirubin Urine NEGATIVE NEGATIVE   Ketones, ur NEGATIVE NEGATIVE mg/dL   Protein, ur NEGATIVE NEGATIVE mg/dL   Nitrite NEGATIVE NEGATIVE   Leukocytes, UA NEGATIVE NEGATIVE   RBC / HPF 0-5 0 - 5 RBC/hpf   WBC, UA 0-5 0 - 5 WBC/hpf   Bacteria, UA RARE (A) NONE SEEN   Squamous Epithelial / LPF 0-5 0 - 5   Mucus PRESENT    Hyaline Casts, UA PRESENT    Assessment & Plan:   Problem List Items Addressed This Visit    None       No orders of the defined types were placed in this encounter.  No orders of the defined types were placed in this encounter.   Follow up plan: No follow-ups on file.  [name], MD

## 2018-05-17 ENCOUNTER — Other Ambulatory Visit: Payer: Self-pay | Admitting: Family Medicine

## 2018-05-17 ENCOUNTER — Ambulatory Visit: Payer: Self-pay | Admitting: Family Medicine

## 2018-05-24 ENCOUNTER — Ambulatory Visit: Payer: Self-pay | Admitting: Family Medicine

## 2018-05-31 ENCOUNTER — Encounter: Payer: Self-pay | Admitting: Family Medicine

## 2018-05-31 ENCOUNTER — Ambulatory Visit: Payer: Self-pay | Admitting: Family Medicine

## 2018-05-31 VITALS — BP 114/68 | HR 81 | Temp 97.9°F | Ht 61.0 in | Wt 184.8 lb

## 2018-05-31 DIAGNOSIS — I2510 Atherosclerotic heart disease of native coronary artery without angina pectoris: Secondary | ICD-10-CM

## 2018-05-31 DIAGNOSIS — Z23 Encounter for immunization: Secondary | ICD-10-CM

## 2018-05-31 DIAGNOSIS — I10 Essential (primary) hypertension: Secondary | ICD-10-CM

## 2018-05-31 DIAGNOSIS — F331 Major depressive disorder, recurrent, moderate: Secondary | ICD-10-CM

## 2018-05-31 DIAGNOSIS — IMO0002 Reserved for concepts with insufficient information to code with codable children: Secondary | ICD-10-CM

## 2018-05-31 DIAGNOSIS — E1165 Type 2 diabetes mellitus with hyperglycemia: Secondary | ICD-10-CM

## 2018-05-31 DIAGNOSIS — E113399 Type 2 diabetes mellitus with moderate nonproliferative diabetic retinopathy without macular edema, unspecified eye: Secondary | ICD-10-CM

## 2018-05-31 DIAGNOSIS — E1140 Type 2 diabetes mellitus with diabetic neuropathy, unspecified: Secondary | ICD-10-CM

## 2018-05-31 DIAGNOSIS — Z9861 Coronary angioplasty status: Secondary | ICD-10-CM

## 2018-05-31 DIAGNOSIS — Z1211 Encounter for screening for malignant neoplasm of colon: Secondary | ICD-10-CM

## 2018-05-31 DIAGNOSIS — R52 Pain, unspecified: Secondary | ICD-10-CM

## 2018-05-31 DIAGNOSIS — E785 Hyperlipidemia, unspecified: Secondary | ICD-10-CM

## 2018-05-31 LAB — POCT GLYCOSYLATED HEMOGLOBIN (HGB A1C): Hemoglobin A1C: 10.2 % — AB (ref 4.0–5.6)

## 2018-05-31 MED ORDER — TRAMADOL HCL 50 MG PO TABS: 50.0000 mg | ORAL_TABLET | Freq: Every day | ORAL | 0 refills | Status: DC | PRN

## 2018-05-31 MED ORDER — ROSUVASTATIN CALCIUM 5 MG PO TABS: 5.0000 mg | ORAL_TABLET | Freq: Every day | ORAL | 6 refills | Status: DC

## 2018-05-31 MED ORDER — FUROSEMIDE 20 MG PO TABS: 20.0000 mg | ORAL_TABLET | Freq: Every day | ORAL | 3 refills | Status: DC | PRN

## 2018-05-31 MED ORDER — LOSARTAN POTASSIUM 25 MG PO TABS: 25.0000 mg | ORAL_TABLET | Freq: Every day | ORAL | 3 refills | Status: DC

## 2018-05-31 MED ORDER — KLOR-CON SPRINKLE 10 MEQ PO CPCR: 10.0000 meq | ORAL_CAPSULE | Freq: Every day | ORAL | 3 refills | Status: DC | PRN

## 2018-05-31 MED ORDER — SIMVASTATIN 40 MG PO TABS: 40.0000 mg | ORAL_TABLET | Freq: Every day | ORAL | 11 refills | Status: DC

## 2018-05-31 MED ORDER — PANTOPRAZOLE SODIUM 40 MG PO TBEC: 40.0000 mg | DELAYED_RELEASE_TABLET | Freq: Every day | ORAL | 11 refills | Status: DC

## 2018-05-31 NOTE — Assessment & Plan Note (Signed)
Continues celexa

## 2018-05-31 NOTE — Assessment & Plan Note (Signed)
Chronic, stable. Continue low dose losartan.

## 2018-05-31 NOTE — Assessment & Plan Note (Signed)
Ongoing. ?statin related - will change from simvastatin to more lipophilic statin like crestor. Did not try long enough previously. Will update me with effect.

## 2018-05-31 NOTE — Progress Notes (Signed)
BP 114/68 (BP Location: Left Arm, Patient Position: Sitting, Cuff Size: Normal)   Pulse 81   Temp 97.9 F (36.6 C) (Oral)   Ht _0  (1.549 m)   Wt 184 lb 12 oz (83.8 kg)   SpO2 94%   BMI 34.91 kg/m    CC: DM Subjective:    Patient ID: Karen [name], female    DOB: 1955-10-22, 63 y.o.   MRN: [id number]  HPI: Karen [name] is a 63 y.o. female presenting on 05/31/2018 for Diabetes (Here for f/u.) and Discuss Medication (Wants to discuss increasing tramadol. )   Last seen 10/2017. Has not returned for physical. Currently no insurance - significant financial concerns.   DM - does not regularly check sugars last checked last week 200s fasting. Compliant with antihyperglycemic regimen which includes: glipizide 2.5-89m BID depending on meal size, lantus 60u BID, novolin R 10u TID AC (has not been taking - [employer] brand), metformin 10045mbid. Denies low sugars or hypoglycemic symptoms. + foot paresthesias. Last diabetic eye exam DUE. Pneumovax: 2015. Prevnar: not due. Glucometer brand: accu-chek. DSME: has not completed due to cost issue.  Lab Results  Component Value Date   HGBA1C 10.2 (A) 05/31/2018   Diabetic Foot Exam - Simple   Simple Foot Form Diabetic Foot exam was performed with the following findings:  Yes 05/31/2018  1:24 PM  Visual Inspection No deformities, no ulcerations, no other skin breakdown bilaterally:  Yes Sensation Testing See comments:  Yes Pulse Check Posterior Tibialis and Dorsalis pulse intact bilaterally:  Yes Comments Diminished to monofilament throughout feet    Lab Results  Component Value Date   MICROALBUR 3.3 (H) 07/02/2015     Discussed colon cancer screening - she agrees to iFOB.  Discussed breast cancer screening - will see if she's eligible for mammogram scholarship.   Continues tramadol for body aches PRN. Hurts "all over" worse with activity. No redness, swelling, fevers, weight loss.      Relevant past medical, surgical, family and  social history reviewed and updated as indicated. Interim medical history since our last visit reviewed. Allergies and medications reviewed and updated. Outpatient Medications Prior to Visit  Medication Sig Dispense Refill  . acetaminophen (TYLENOL 8 HOUR) 650 MG CR tablet Take 2 tablets (1,300 mg total) by mouth 2 (two) times daily as needed for pain.    . Marland Kitchenspirin EC 81 MG EC tablet Take 1 tablet (81 mg total) by mouth daily.    . citalopram (CELEXA) 20 MG tablet Take 1 tablet (20 mg total) by mouth daily. 30 tablet 6  . gabapentin (NEURONTIN) 300 MG capsule Take 1 capsule (300 mg total) by mouth at bedtime. 90 capsule 3  . glipiZIDE (GLUCOTROL) 5 MG tablet Take 0.5-1 tablets (2.5-5 mg total) by mouth 2 (two) times daily. Depending on meal size 180 tablet 0  . Insulin Glargine (LANTUS SOLOSTAR) 100 UNIT/ML Solostar Pen Inject 60 Units into the skin 2 (two) times daily. QS 1 mo 12 pen 11  . Insulin Pen Needle (PEN NEEDLES) 31G X 8 MM MISC Use to inject Lantus as directed twice daily. Dx: E11.40 60 each 3  . insulin regular (NOVOLIN R) 100 units/mL injection Inject 0.1 mLs (10 Units total) into the skin 3 (three) times daily before meals. 10 mL 11  . Loperamide HCl (IMODIUM A-D PO) Take 1-2 tablets by mouth daily as needed (for stomach).     . metFORMIN (GLUCOPHAGE) 1000 MG tablet Take 1 tablet (1,000 mg  total) by mouth 2 (two) times daily with a meal. 180 tablet 0  . nitroGLYCERIN (NITROSTAT) 0.4 MG SL tablet Place 1 tablet (0.4 mg total) under the tongue every 5 (five) minutes as needed for chest pain. 25 tablet 0  . furosemide (LASIX) 20 MG tablet Take 1 tablet (20 mg total) by mouth daily as needed for edema. 30 tablet 3  . KLOR-CON SPRINKLE 10 MEQ CR capsule Take 1 capsule (10 mEq total) by mouth daily as needed. When taking Lasix 30 capsule 3  . losartan (COZAAR) 25 MG tablet Take 1 tablet (25 mg total) by mouth daily. 90 tablet 1  . pantoprazole (PROTONIX) 40 MG tablet Take 1 tablet (40 mg  total) by mouth daily. 30 tablet 3  . simvastatin (ZOCOR) 40 MG tablet TAKE 1 TABLET (40 MG TOTAL) BY MOUTH DAILY. 30 tablet 0  . traMADol (ULTRAM) 50 MG tablet Take 1 tablet (50 mg total) by mouth daily as needed for moderate pain. 30 tablet 0   No facility-administered medications prior to visit.      Per HPI unless specifically indicated in ROS section below Review of Systems Objective:    BP 114/68 (BP Location: Left Arm, Patient Position: Sitting, Cuff Size: Normal)   Pulse 81   Temp 97.9 F (36.6 C) (Oral)   Ht _0  (1.549 m)   Wt 184 lb 12 oz (83.8 kg)   SpO2 94%   BMI 34.91 kg/m   Wt Readings from Last 3 Encounters:  05/31/18 184 lb 12 oz (83.8 kg)  01/10/18 182 lb (82.6 kg)  11/07/17 181 lb 8 oz (82.3 kg)    Physical Exam Vitals signs and nursing note reviewed.  Constitutional:      General: She is not in acute distress.    Appearance: She is well-developed.  HENT:     Head: Normocephalic and atraumatic.     Right Ear: External ear normal.     Left Ear: External ear normal.     Nose: Nose normal.     Mouth/Throat:     Pharynx: No oropharyngeal exudate.  Eyes:     General: No scleral icterus.    Conjunctiva/sclera: Conjunctivae normal.     Pupils: Pupils are equal, round, and reactive to light.  Neck:     Musculoskeletal: Normal range of motion and neck supple.  Cardiovascular:     Rate and Rhythm: Normal rate and regular rhythm.     Heart sounds: Normal heart sounds. No murmur.  Pulmonary:     Effort: Pulmonary effort is normal. No respiratory distress.     Breath sounds: Normal breath sounds. No wheezing or rales.  Musculoskeletal:     Comments: See HPI for foot exam if done  Lymphadenopathy:     Cervical: No cervical adenopathy.  Skin:    General: Skin is warm and dry.     Findings: No rash.       Results for orders placed or performed in visit on 05/31/18  POCT glycosylated hemoglobin (Hb A1C)  Result Value Ref Range   Hemoglobin A1C 10.2 (A)  4.0 - 5.6 %   HbA1c POC (<> result, manual entry)     HbA1c, POC (prediabetic range)     HbA1c, POC (controlled diabetic range)     Lab Results  Component Value Date   CHOL 210 (H) 02/08/2016   HDL 36.40 (L) 02/08/2016   LDLCALC UNABLE TO CALCULATE IF TRIGLYCERIDE OVER 400 mg/dL 07/24/2013   LDLDIRECT 111.0 05/17/2017  TRIG (H) 02/08/2016    560.0 Triglyceride is over 400; calculations on Lipids are invalid.   CHOLHDL 6 02/08/2016    Assessment & Plan:  Will se if we can set patient up with financial assistance for diabetes classes and mammogram scholarship - I will touch base with Gerre Pebbles. Problem List Items Addressed This Visit    Type 2 diabetes, uncontrolled, with neuropathy ([place]) - Primary    Chronically uncontrolled with complications. A1c elevated again today. Compliant with lantus 60u bid and metformin. Has not been taking regular insulin (novolin R) but states she will go to [place] and buy to start.  Unable to afford diabetes classes or eye exam.  RTC 3 mo DM f/u visit.       Relevant Medications   losartan (COZAAR) 25 MG tablet   simvastatin (ZOCOR) 40 MG tablet   rosuvastatin (CRESTOR) 5 MG tablet   Other Relevant Orders   POCT glycosylated hemoglobin (Hb A1C) (Completed)   MDD (major depressive disorder), recurrent episode, moderate ([facility])    Continues celexa      HTN (hypertension)    Chronic, stable. Continue low dose losartan.       Relevant Medications   furosemide (LASIX) 20 MG tablet   KLOR-CON SPRINKLE 10 MEQ CR capsule   losartan (COZAAR) 25 MG tablet   simvastatin (ZOCOR) 40 MG tablet   rosuvastatin (CRESTOR) 5 MG tablet   Dyslipidemia    Has been regular with simvastatin. Trial crestor to see if any improvement in body aches.       Relevant Medications   simvastatin (ZOCOR) 40 MG tablet   rosuvastatin (CRESTOR) 5 MG tablet   Diabetic retinopathy ([place])    Has been unable to afford return to eye doctor.       Relevant Medications    losartan (COZAAR) 25 MG tablet   simvastatin (ZOCOR) 40 MG tablet   rosuvastatin (CRESTOR) 5 MG tablet   CAD S/P percutaneous coronary angioplasty    Continue aspirin and statin. Has not returned to cards since 2017. Unable to afford DAPT.       Relevant Medications   furosemide (LASIX) 20 MG tablet   losartan (COZAAR) 25 MG tablet   simvastatin (ZOCOR) 40 MG tablet   rosuvastatin (CRESTOR) 5 MG tablet   Body aches    Ongoing. ?statin related - will change from simvastatin to more lipophilic statin like crestor. Did not try long enough previously. Will update me with effect.        Other Visit Diagnoses    Need for influenza vaccination       Relevant Orders   Flu Vaccine QUAD 36+ mos IM (Completed)   Special screening for malignant neoplasms, colon       Relevant Orders   Fecal occult blood, imunochemical       Meds ordered this encounter  Medications  . furosemide (LASIX) 20 MG tablet    Sig: Take 1 tablet (20 mg total) by mouth daily as needed for edema.    Dispense:  30 tablet    Refill:  3  . KLOR-CON SPRINKLE 10 MEQ CR capsule    Sig: Take 1 capsule (10 mEq total) by mouth daily as needed. When taking Lasix    Dispense:  30 capsule    Refill:  3  . losartan (COZAAR) 25 MG tablet    Sig: Take 1 tablet (25 mg total) by mouth daily.    Dispense:  90 tablet    Refill:  3  . pantoprazole (PROTONIX) 40 MG tablet    Sig: Take 1 tablet (40 mg total) by mouth daily.    Dispense:  30 tablet    Refill:  11  . simvastatin (ZOCOR) 40 MG tablet    Sig: Take 1 tablet (40 mg total) by mouth daily at 6 PM.    Dispense:  30 tablet    Refill:  11    This prescription was filled on 05/17/2018. Any refills authorized will be placed on file.  Marland Kitchen DISCONTD: rosuvastatin (CRESTOR) 5 MG tablet    Sig: Take 1 tablet (5 mg total) by mouth daily.    Dispense:  30 tablet    Refill:  6  . rosuvastatin (CRESTOR) 5 MG tablet    Sig: Take 1 tablet (5 mg total) by mouth daily.    Dispense:   30 tablet    Refill:  6    In place of simvastatin  . traMADol (ULTRAM) 50 MG tablet    Sig: Take 1 tablet (50 mg total) by mouth daily as needed for moderate pain.    Dispense:  30 tablet    Refill:  0   Orders Placed This Encounter  Procedures  . Fecal occult blood, imunochemical    Standing Status:   Future    Standing Expiration Date:   06/01/2019  . Flu Vaccine QUAD 36+ mos IM  . POCT glycosylated hemoglobin (Hb A1C)   Patient Instructions  Pass by lab to pick up stool kit for colon cancer screening.  We will contact you for information mammogram scholarship.  Try crestor 71m daily in place of simvastatin 473mdaily - hopeful for less body aches.  Continue current medicines. Start checking sugars more regularly and bring me log.  Return as needed or in 3 months for follow up visit.    Follow up plan: Return in about 3 months (around 08/30/2018) for follow up visit.  JaRia BushMD

## 2018-05-31 NOTE — Assessment & Plan Note (Addendum)
Continue aspirin and statin. Has not returned to cards since 2017. Unable to afford DAPT.

## 2018-05-31 NOTE — Assessment & Plan Note (Signed)
Chronically uncontrolled with complications. A1c elevated again today. Compliant with lantus 60u bid and metformin. Has not been taking regular insulin (novolin R) but states she will go to [place] [facility] and buy to start.  Unable to afford diabetes classes or eye exam.  RTC 3 mo DM f/u visit.

## 2018-05-31 NOTE — Assessment & Plan Note (Addendum)
Has been regular with simvastatin. Trial crestor to see if any improvement in body aches.

## 2018-05-31 NOTE — Assessment & Plan Note (Signed)
Has been unable to afford return to eye doctor.

## 2018-05-31 NOTE — Patient Instructions (Addendum)
Pass by lab to pick up stool kit for colon cancer screening.  We will contact you for information mammogram scholarship.  Try crestor 67m daily in place of simvastatin 461mdaily - hopeful for less body aches.  Continue current medicines. Start checking sugars more regularly and bring me log.  Return as needed or in 3 months for follow up visit.

## 2018-08-06 ENCOUNTER — Other Ambulatory Visit: Payer: Self-pay | Admitting: Family Medicine

## 2018-08-07 NOTE — Telephone Encounter (Signed)
Name of Medication: Tramadol Name of Pharmacy: [facility] or Written Date and Quantity: 06/01/18, #30 Last Office Visit and Type: 05/31/18, f/u Next Office Visit and Type: 09/04/18, 3 mo f/u Last Controlled Substance Agreement Date: none Last UDS: none

## 2018-08-09 ENCOUNTER — Encounter: Payer: Self-pay | Admitting: Family Medicine

## 2018-08-09 MED ORDER — METFORMIN HCL 1000 MG PO TABS: 1000.0000 mg | ORAL_TABLET | Freq: Two times a day (BID) | ORAL | 0 refills | Status: DC

## 2018-08-09 MED ORDER — GLIPIZIDE 5 MG PO TABS: 2.5000 mg | ORAL_TABLET | Freq: Two times a day (BID) | ORAL | 0 refills | Status: DC

## 2018-08-09 NOTE — Telephone Encounter (Signed)
E-scribed metformin and glipizide.  Notified pt via [place].

## 2018-08-09 NOTE — Telephone Encounter (Signed)
Eprescribed.

## 2018-08-09 NOTE — Telephone Encounter (Signed)
Spoke with [facility], asking about the status of pt's shipment of Lantus.  Was told they are not showing refill fax was received. I refaxed request.    Updated pt with this info via MyChart.

## 2018-08-17 NOTE — Telephone Encounter (Signed)
Pt's Lantus is in Flu vaccine refrigerator, 2nd shelf on the door.  Total of 7 boxes. Use plastic bag to give to pt. She has been instructed to park at back door and call when she arrives.

## 2018-08-21 NOTE — Telephone Encounter (Signed)
Pt picked up Lantus on 08/20/18.

## 2018-09-03 ENCOUNTER — Other Ambulatory Visit: Payer: Self-pay

## 2018-09-03 ENCOUNTER — Other Ambulatory Visit: Payer: Self-pay | Admitting: Family Medicine

## 2018-09-03 DIAGNOSIS — IMO0002 Reserved for concepts with insufficient information to code with codable children: Secondary | ICD-10-CM

## 2018-09-03 DIAGNOSIS — E785 Hyperlipidemia, unspecified: Secondary | ICD-10-CM

## 2018-09-03 DIAGNOSIS — E1140 Type 2 diabetes mellitus with diabetic neuropathy, unspecified: Secondary | ICD-10-CM

## 2018-09-03 DIAGNOSIS — I10 Essential (primary) hypertension: Secondary | ICD-10-CM

## 2018-09-03 DIAGNOSIS — R52 Pain, unspecified: Secondary | ICD-10-CM

## 2018-09-03 DIAGNOSIS — E1165 Type 2 diabetes mellitus with hyperglycemia: Principal | ICD-10-CM

## 2018-09-04 ENCOUNTER — Ambulatory Visit: Payer: Self-pay | Admitting: Family Medicine

## 2018-09-10 ENCOUNTER — Other Ambulatory Visit (INDEPENDENT_AMBULATORY_CARE_PROVIDER_SITE_OTHER): Payer: Self-pay

## 2018-09-10 ENCOUNTER — Telehealth: Payer: Self-pay | Admitting: Radiology

## 2018-09-10 ENCOUNTER — Other Ambulatory Visit: Payer: Self-pay

## 2018-09-10 DIAGNOSIS — E1140 Type 2 diabetes mellitus with diabetic neuropathy, unspecified: Secondary | ICD-10-CM

## 2018-09-10 DIAGNOSIS — E785 Hyperlipidemia, unspecified: Secondary | ICD-10-CM

## 2018-09-10 DIAGNOSIS — IMO0002 Reserved for concepts with insufficient information to code with codable children: Secondary | ICD-10-CM

## 2018-09-10 DIAGNOSIS — E1165 Type 2 diabetes mellitus with hyperglycemia: Secondary | ICD-10-CM

## 2018-09-10 LAB — COMPREHENSIVE METABOLIC PANEL
ALT: 26 U/L (ref 0–35)
AST: 31 U/L (ref 0–37)
Albumin: 4 g/dL (ref 3.5–5.2)
Alkaline Phosphatase: 72 U/L (ref 39–117)
BUN: 17 mg/dL (ref 6–23)
CO2: 27 mEq/L (ref 19–32)
Calcium: 9.1 mg/dL (ref 8.4–10.5)
Chloride: 98 mEq/L (ref 96–112)
Creatinine, Ser: 0.79 mg/dL (ref 0.40–1.20)
GFR: 73.98 mL/min (ref >60.00)
Glucose, Bld: 404 mg/dL — ABNORMAL HIGH (ref 70–99)
Potassium: 5.1 mEq/L (ref 3.5–5.1)
Sodium: 133 mEq/L — ABNORMAL LOW (ref 135–145)
Total Bilirubin: 0.5 mg/dL (ref 0.2–1.2)
Total Protein: 6.8 g/dL (ref 6.0–8.3)

## 2018-09-10 LAB — LIPID PANEL
Cholesterol: 205 mg/dL — ABNORMAL HIGH (ref 0–200)
HDL: 27.7 mg/dL — ABNORMAL LOW (ref >39.00)
Total CHOL/HDL Ratio: 7
Triglycerides: 694 mg/dL — ABNORMAL HIGH (ref 0.0–149.0)

## 2018-09-10 LAB — HEMOGLOBIN A1C: Hgb A1c MFr Bld: 12.3 % — ABNORMAL HIGH (ref 4.6–6.5)

## 2018-09-10 LAB — LDL CHOLESTEROL, DIRECT: Direct LDL: 49 mg/dL

## 2018-09-10 NOTE — Telephone Encounter (Signed)
Yes please have her collect clean catch and drop off tomorrow. Collect tomorrow then bring in.

## 2018-09-10 NOTE — Telephone Encounter (Signed)
Patient came in for lab work, said," she has abdominal cramping and discomfort when urinating x 11/2 weeks". I gave her a urine set up to take home incase you wanted a sample before her web vist on Wednesday. She will not collect unless you instruct her to do so.

## 2018-09-11 NOTE — Telephone Encounter (Signed)
LVM for patient to do the urine collection

## 2018-09-12 ENCOUNTER — Ambulatory Visit (INDEPENDENT_AMBULATORY_CARE_PROVIDER_SITE_OTHER): Payer: Self-pay | Admitting: Family Medicine

## 2018-09-12 ENCOUNTER — Encounter: Payer: Self-pay | Admitting: Family Medicine

## 2018-09-12 ENCOUNTER — Telehealth: Payer: Self-pay | Admitting: Family Medicine

## 2018-09-12 VITALS — Ht 61.0 in

## 2018-09-12 DIAGNOSIS — F331 Major depressive disorder, recurrent, moderate: Secondary | ICD-10-CM

## 2018-09-12 DIAGNOSIS — E785 Hyperlipidemia, unspecified: Secondary | ICD-10-CM

## 2018-09-12 DIAGNOSIS — K219 Gastro-esophageal reflux disease without esophagitis: Secondary | ICD-10-CM

## 2018-09-12 DIAGNOSIS — R52 Pain, unspecified: Secondary | ICD-10-CM

## 2018-09-12 DIAGNOSIS — E1165 Type 2 diabetes mellitus with hyperglycemia: Secondary | ICD-10-CM

## 2018-09-12 DIAGNOSIS — E113399 Type 2 diabetes mellitus with moderate nonproliferative diabetic retinopathy without macular edema, unspecified eye: Secondary | ICD-10-CM

## 2018-09-12 DIAGNOSIS — E1140 Type 2 diabetes mellitus with diabetic neuropathy, unspecified: Secondary | ICD-10-CM

## 2018-09-12 DIAGNOSIS — R300 Dysuria: Secondary | ICD-10-CM | POA: Insufficient documentation

## 2018-09-12 DIAGNOSIS — IMO0002 Reserved for concepts with insufficient information to code with codable children: Secondary | ICD-10-CM

## 2018-09-12 MED ORDER — OMEPRAZOLE 40 MG PO CPDR: 40.0000 mg | DELAYED_RELEASE_CAPSULE | Freq: Every day | ORAL | 11 refills | Status: DC

## 2018-09-12 MED ORDER — SERTRALINE HCL 50 MG PO TABS: 50.0000 mg | ORAL_TABLET | Freq: Every day | ORAL | 11 refills | Status: DC

## 2018-09-12 NOTE — Assessment & Plan Note (Signed)
LDL improved, rosuvastatin has been better tolerated. Will continue. MARKEDLY elevated triglycerides, anticipate driven by hyperglycemia. Discussed at increased risk of pancreatitis with trig >500, she will work towards better glycemic control. Will reassess at f/u visit. If persistently marked hypertriglyceridemia, consider fibrate.

## 2018-09-12 NOTE — Telephone Encounter (Signed)
Patient returned call from the office to go over medications before visit with MD.   BEST PHONE- [phone]

## 2018-09-12 NOTE — Assessment & Plan Note (Signed)
Overdue for f/u - trouble affording appointments.

## 2018-09-12 NOTE — Assessment & Plan Note (Signed)
Switch from celexa to zoloft 50mg  daily due to PPI interaction as above.

## 2018-09-12 NOTE — Assessment & Plan Note (Signed)
Concern for infection - will drop off UA for further evaluation.

## 2018-09-12 NOTE — Assessment & Plan Note (Signed)
Pantoprazole has not been as effective - will restart omeprazole 40mg  daily. Will stop celexa as per above.

## 2018-09-12 NOTE — Progress Notes (Signed)
Virtual visit completed through [employer]. Due to national recommendations of social distancing due to [place] 19, a virtual visit is felt to be most appropriate for this patient at this time.   Patient location: home Provider location: [place] at [facility], office If any vitals were documented, they were collected by patient at home unless specified below.    Ht 5\' 1"  (1.549 m)   BMI 34.91 kg/m    CC: 3 mo f/u visit Subjective:    Patient ID: Karen [name], female    DOB: 14-Apr-1956, 63 y.o.   MRN: [id number]  HPI: Karen [name] is a 63 y.o. female presenting on 09/12/2018 for Follow-up (3 mo f/u.)   Friend just passed away from diabetic complications. This was a "wake up call".   HLD - last visit we changed simvastatin to crestor hopeful for better tolerability. No myalgias, body aches have significantly improved with switch. She is happy about this.   DM - has been intermittently check sugars 200-400s. Reading today was 157. She just woke up today, hasn't eaten anything. Has not been following diabetic diet and has been snacking at home a lot since stay-at-home order. Compliant with antihyperglycemic regimen which includes: glipizide 5mg  bid depending on meal size, lantus 60u bid, metformin 1000mg  bid. Denies low sugars or hypoglycemic symptoms. Has not been taking her [employer] brand novolin R 10u TID AC - but planning to pick up medication this week to start again. + numbness of feet, mild tingling of R>L fingers, blurry vision. Last diabetic eye exam DUE. Known diabetic retinopathy on right. Pneumovax: 2015. Prevnar: not due. Glucometer brand: accuchek. DSME: has not completed - unaffordable. Lab Results  Component Value Date   HGBA1C 12.3 (H) 09/10/2018   Diabetic Foot Exam - Simple   No data filed     Lab Results  Component Value Date   MICROALBUR 3.3 (H) 07/02/2015     Several weeks of intermittent dysuria, frequency worse at night, lower back pain, lower abd cramping,  some R flank pain. Treating with tylenol with benefit. No fevers/chills, hematuria, nausea. She does tend to hold urine.   GERD - omeprazole was changed due to interaction with celexa but she finds pantoprazole is not as effective. Desires to return to omeprazole, agreeable to switch antidepressant.   From last visit: Will se if we can set patient up with financial assistance for diabetes classes and mammogram scholarship - I will touch base with Gerre Pebbles. She never returned iFOB.  She has not completed mammogram.      Relevant past medical, surgical, family and social history reviewed and updated as indicated. Interim medical history since our last visit reviewed. Allergies and medications reviewed and updated. Outpatient Medications Prior to Visit  Medication Sig Dispense Refill  . acetaminophen (TYLENOL 8 HOUR) 650 MG CR tablet Take 2 tablets (1,300 mg total) by mouth 2 (two) times daily as needed for pain.    Marland Kitchen aspirin EC 81 MG EC tablet Take 1 tablet (81 mg total) by mouth daily.    . furosemide (LASIX) 20 MG tablet Take 1 tablet (20 mg total) by mouth daily as needed for edema. 30 tablet 3  . gabapentin (NEURONTIN) 300 MG capsule Take 1 capsule (300 mg total) by mouth at bedtime. 90 capsule 3  . glipiZIDE (GLUCOTROL) 5 MG tablet Take 0.5-1 tablets (2.5-5 mg total) by mouth 2 (two) times daily. Depending on meal size 180 tablet 0  . Insulin Glargine (LANTUS SOLOSTAR) 100 UNIT/ML  Solostar Pen Inject 60 Units into the skin 2 (two) times daily. QS 1 mo 12 pen 11  . Insulin Pen Needle (PEN NEEDLES) 31G X 8 MM MISC Use to inject Lantus as directed twice daily. Dx: E11.40 60 each 3  . insulin regular (NOVOLIN R) 100 units/mL injection Inject 0.1 mLs (10 Units total) into the skin 3 (three) times daily before meals. 10 mL 11  . KLOR-CON SPRINKLE 10 MEQ CR capsule Take 1 capsule (10 mEq total) by mouth daily as needed. When taking Lasix 30 capsule 3  . Loperamide HCl (IMODIUM A-D PO) Take  1-2 tablets by mouth daily as needed (for stomach).     . losartan (COZAAR) 25 MG tablet Take 1 tablet (25 mg total) by mouth daily. 90 tablet 3  . metFORMIN (GLUCOPHAGE) 1000 MG tablet Take 1 tablet (1,000 mg total) by mouth 2 (two) times daily with a meal. 180 tablet 0  . nitroGLYCERIN (NITROSTAT) 0.4 MG SL tablet Place 1 tablet (0.4 mg total) under the tongue every 5 (five) minutes as needed for chest pain. 25 tablet 0  . rosuvastatin (CRESTOR) 5 MG tablet Take 1 tablet (5 mg total) by mouth daily. 30 tablet 6  . traMADol (ULTRAM) 50 MG tablet Take 1 tablet (50 mg total) by mouth daily as needed for moderate pain. 30 tablet 0  . citalopram (CELEXA) 20 MG tablet Take 1 tablet (20 mg total) by mouth daily. 30 tablet 6  . pantoprazole (PROTONIX) 40 MG tablet Take 1 tablet (40 mg total) by mouth daily. 30 tablet 11  . simvastatin (ZOCOR) 40 MG tablet Take 1 tablet (40 mg total) by mouth daily at 6 PM. 30 tablet 11   No facility-administered medications prior to visit.      Per HPI unless specifically indicated in ROS section below Review of Systems Objective:    Ht 5\' 1"  (1.549 m)   BMI 34.91 kg/m   Wt Readings from Last 3 Encounters:  05/31/18 184 lb 12 oz (83.8 kg)  01/10/18 182 lb (82.6 kg)  11/07/17 181 lb 8 oz (82.3 kg)     Physical exam: Gen: alert, NAD, not ill appearing Pulm: speaks in complete sentences without increased work of breathing Psych: normal mood, normal thought content      Results for orders placed or performed in visit on 09/10/18  Hemoglobin A1c  Result Value Ref Range   Hgb A1c MFr Bld 12.3 (H) 4.6 - 6.5 %  Comprehensive metabolic panel  Result Value Ref Range   Sodium 133 (L) 135 - 145 mEq/L   Potassium 5.1 3.5 - 5.1 mEq/L   Chloride 98 96 - 112 mEq/L   CO2 27 19 - 32 mEq/L   Glucose, Bld 404 (H) 70 - 99 mg/dL   BUN 17 6 - 23 mg/dL   Creatinine, Ser 0.79 0.40 - 1.20 mg/dL   Total Bilirubin 0.5 0.2 - 1.2 mg/dL   Alkaline Phosphatase 72 39 - 117  U/L   AST 31 0 - 37 U/L   ALT 26 0 - 35 U/L   Total Protein 6.8 6.0 - 8.3 g/dL   Albumin 4.0 3.5 - 5.2 g/dL   Calcium 9.1 8.4 - 10.5 mg/dL   GFR 73.98 >60.00 mL/min  Lipid panel  Result Value Ref Range   Cholesterol 205 (H) 0 - 200 mg/dL   Triglycerides (H) 0.0 - 149.0 mg/dL    694.0 Triglyceride is over 400; calculations on Lipids are invalid.   HDL  27.70 (L) >39.00 mg/dL   Total CHOL/HDL Ratio 7   LDL cholesterol, direct  Result Value Ref Range   Direct LDL 49.0 mg/dL   Assessment & Plan:  Will again touch base with Ebony Hail about patient assistance for mammogram and diabetes screen.  Problem List Items Addressed This Visit    Type 2 diabetes, uncontrolled, with neuropathy ([place]) - Primary    Chronic, remains uncontrolled. She states she's now motivated to follow diabetic diet, restart novolin R. Will reassess at 3 mo f/u visit, update [place] sooner if any concerns or sugars staying high despite adherence to diabetic regimen.       MDD (major depressive disorder), recurrent episode, moderate (HCC)    Switch from celexa to zoloft 50mg  daily due to PPI interaction as above.       Relevant Medications   sertraline (ZOLOFT) 50 MG tablet   GERD (gastroesophageal reflux disease)    Pantoprazole has not been as effective - will restart omeprazole 40mg  daily. Will stop celexa as per above.       Relevant Medications   omeprazole (PRILOSEC) 40 MG capsule   Dysuria    Concern for infection - will drop off UA for further evaluation.       Dyslipidemia    LDL improved, rosuvastatin has been better tolerated. Will continue. MARKEDLY elevated triglycerides, anticipate driven by hyperglycemia. Discussed at increased risk of pancreatitis with trig >500, she will work towards better glycemic control. Will reassess at f/u visit. If persistently marked hypertriglyceridemia, consider fibrate.       Diabetic retinopathy ([place])    Overdue for f/u - trouble affording appointments.        RESOLVED: Body aches       Meds ordered this encounter  Medications  . sertraline (ZOLOFT) 50 MG tablet    Sig: Take 1 tablet (50 mg total) by mouth daily.    Dispense:  30 tablet    Refill:  11    In place of celexa.  Marland Kitchen omeprazole (PRILOSEC) 40 MG capsule    Sig: Take 1 capsule (40 mg total) by mouth daily.    Dispense:  30 capsule    Refill:  11    In place of pantoprazole   No orders of the defined types were placed in this encounter.   Follow up plan: Return in about 3 months (around 12/12/2018) for annual exam, prior fasting for blood work.  [name], MD

## 2018-09-12 NOTE — Telephone Encounter (Signed)
Valinda Hoar spoke with patient and took care of this.  Thanks.

## 2018-09-12 NOTE — Assessment & Plan Note (Signed)
Chronic, remains uncontrolled. She states she's now motivated to follow diabetic diet, restart novolin R. Will reassess at 3 mo f/u visit, update [place] sooner if any concerns or sugars staying high despite adherence to diabetic regimen.

## 2018-09-14 LAB — POC URINALSYSI DIPSTICK (AUTOMATED)
Bilirubin, UA: NEGATIVE
Blood, UA: NEGATIVE
Glucose, UA: POSITIVE — AB
Ketones, UA: NEGATIVE
Leukocytes, UA: NEGATIVE
Nitrite, UA: NEGATIVE
Protein, UA: NEGATIVE
Spec Grav, UA: 1.01 (ref 1.010–1.025)
Urobilinogen, UA: 0.2 E.U./dL
pH, UA: 6 (ref 5.0–8.0)

## 2018-09-14 NOTE — Addendum Note (Signed)
Addended by: Brenton Grills on: 0/07/7942 46:19 PM   Modules accepted: Orders

## 2018-09-14 NOTE — Addendum Note (Signed)
Addended by: [name] on: 09/14/2018 02:50 PM   Modules accepted: Orders

## 2018-09-14 NOTE — Addendum Note (Signed)
Addended by: Ellamae Sia on: 09/14/2018 03:44 PM   Modules accepted: Orders

## 2018-09-16 LAB — URINE CULTURE
MICRO NUMBER:: 439612
Result:: NO GROWTH
SPECIMEN QUALITY:: ADEQUATE

## 2018-09-30 ENCOUNTER — Encounter: Payer: Self-pay | Admitting: Family Medicine

## 2018-09-30 DIAGNOSIS — K5900 Constipation, unspecified: Secondary | ICD-10-CM

## 2018-10-10 ENCOUNTER — Telehealth (INDEPENDENT_AMBULATORY_CARE_PROVIDER_SITE_OTHER): Payer: Self-pay | Admitting: Family Medicine

## 2018-10-10 ENCOUNTER — Encounter: Payer: Self-pay | Admitting: Family Medicine

## 2018-10-10 VITALS — Ht 61.0 in

## 2018-10-10 DIAGNOSIS — K5900 Constipation, unspecified: Secondary | ICD-10-CM | POA: Insufficient documentation

## 2018-10-10 DIAGNOSIS — K5909 Other constipation: Secondary | ICD-10-CM | POA: Insufficient documentation

## 2018-10-10 DIAGNOSIS — R194 Change in bowel habit: Secondary | ICD-10-CM

## 2018-10-10 MED ORDER — METFORMIN HCL ER 750 MG PO TB24: 1500.0000 mg | ORAL_TABLET | Freq: Every day | ORAL | 3 refills | Status: DC

## 2018-10-10 NOTE — Assessment & Plan Note (Signed)
Associated with cramping after bowel movements. Not consistent with IBS or diverticulitis or C diff colitis. ?med related - will change metformin to XR 750mg  2 tab in am. rec lactose free diet x 1 wk to monitor symptoms. rec avoid gas producing foods. Encouraged she complete iFOB - as overdue for colon cancer screening.  No endorsed weight loss, blood in stool.  Pt agrees with plan.

## 2018-10-10 NOTE — Progress Notes (Signed)
Virtual visit completed through [place]. Due to national recommendations of social distancing due to COVID-19, a virtual visit is felt to be most appropriate for this patient at this time.   Patient location: home Provider location: [place] at [facility], office If any vitals were documented, they were collected by patient at home unless specified below.    Ht 5\' 1"  (1.549 m)   BMI 34.91 kg/m    CC: abd pain, constipation Subjective:    Patient ID: Karen [name], female    DOB: [birth year], 63 y.o.   MRN: [id number]  HPI: Karen [name] is a 63 y.o. female presenting on 10/10/2018 for Constipation (C/o constipation. Tried Miralax, barely helpful. ) and Abdominal Pain (C/o low abd cramps after BMs. )   Self pay patient.  Ongoing bowel changes for the last month. Described initial hard balls of stool with lower abdominal cramping. Passing gas fine. Miralax 1 capful twice daily was helpful so she only takes PRN - but she has started having lower back and lower abdominal cramping after bowel movements. Notes increased gassiness associated with clear mucous/lqiuid stools. Tylenol helps relieve cramping.   No fevers, blood in stool, or weight loss. Does not have scale at home. No nausea/vomiting.  She wonders about IBS.   Last month we changed pantoprazole back to omeprazole due to poor effectiveness of prior. We also switched from celexa to zoloft 50mg  due to PPI interaction.   Noticing increasing trouble with sugar control - cbg's staying 200s. This is despite glipizide 5mg  BID, metformin 1000mg  bid, lantus 60u bid, novolin R 10u TID AC.   Has not completed iFOB - encouraged she complete this.      Relevant past medical, surgical, family and social history reviewed and updated as indicated. Interim medical history since our last visit reviewed. Allergies and medications reviewed and updated. Outpatient Medications Prior to Visit  Medication Sig Dispense Refill  . acetaminophen  (TYLENOL 8 HOUR) 650 MG CR tablet Take 2 tablets (1,300 mg total) by mouth 2 (two) times daily as needed for pain.    Marland Kitchen aspirin EC 81 MG EC tablet Take 1 tablet (81 mg total) by mouth daily.    . furosemide (LASIX) 20 MG tablet Take 1 tablet (20 mg total) by mouth daily as needed for edema. 30 tablet 3  . gabapentin (NEURONTIN) 300 MG capsule Take 1 capsule (300 mg total) by mouth at bedtime. 90 capsule 3  . glipiZIDE (GLUCOTROL) 5 MG tablet Take 0.5-1 tablets (2.5-5 mg total) by mouth 2 (two) times daily. Depending on meal size 180 tablet 0  . Insulin Glargine (LANTUS SOLOSTAR) 100 UNIT/ML Solostar Pen Inject 60 Units into the skin 2 (two) times daily. QS 1 mo 12 pen 11  . Insulin Pen Needle (PEN NEEDLES) 31G X 8 MM MISC Use to inject Lantus as directed twice daily. Dx: E11.40 60 each 3  . insulin regular (NOVOLIN R) 100 units/mL injection Inject 0.1 mLs (10 Units total) into the skin 3 (three) times daily before meals. 10 mL 11  . KLOR-CON SPRINKLE 10 MEQ CR capsule Take 1 capsule (10 mEq total) by mouth daily as needed. When taking Lasix 30 capsule 3  . Loperamide HCl (IMODIUM A-D PO) Take 1-2 tablets by mouth daily as needed (for stomach).     . losartan (COZAAR) 25 MG tablet Take 1 tablet (25 mg total) by mouth daily. 90 tablet 3  . metFORMIN (GLUCOPHAGE) 1000 MG tablet Take 1 tablet (1,000 mg  total) by mouth 2 (two) times daily with a meal. 180 tablet 0  . nitroGLYCERIN (NITROSTAT) 0.4 MG SL tablet Place 1 tablet (0.4 mg total) under the tongue every 5 (five) minutes as needed for chest pain. 25 tablet 0  . omeprazole (PRILOSEC) 40 MG capsule Take 1 capsule (40 mg total) by mouth daily. 30 capsule 11  . rosuvastatin (CRESTOR) 5 MG tablet Take 1 tablet (5 mg total) by mouth daily. 30 tablet 6  . sertraline (ZOLOFT) 50 MG tablet Take 1 tablet (50 mg total) by mouth daily. 30 tablet 11  . traMADol (ULTRAM) 50 MG tablet Take 1 tablet (50 mg total) by mouth daily as needed for moderate pain. 30  tablet 0   No facility-administered medications prior to visit.      Per HPI unless specifically indicated in ROS section below Review of Systems Objective:    Ht 5\' 1"  (1.549 m)   BMI 34.91 kg/m   Wt Readings from Last 3 Encounters:  05/31/18 184 lb 12 oz (83.8 kg)  01/10/18 182 lb (82.6 kg)  11/07/17 181 lb 8 oz (82.3 kg)     Physical exam: Gen: alert, NAD, not ill appearing Pulm: speaks in complete sentences without increased work of breathing Psych: normal mood, normal thought content      Results for orders placed or performed in visit on 09/12/18  Urine Culture  Result Value Ref Range   MICRO NUMBER: [id number]    SPECIMEN QUALITY: Adequate    Sample Source NOT GIVEN    STATUS: FINAL    Result: No Growth   POCT Urinalysis Dipstick (Automated)  Result Value Ref Range   Color, UA yellow    Clarity, UA clear    Glucose, UA Positive (A) Negative   Bilirubin, UA negative    Ketones, UA negative    Spec Grav, UA 1.010 1.010 - 1.025   Blood, UA negative    pH, UA 6.0 5.0 - 8.0   Protein, UA Negative Negative   Urobilinogen, UA 0.2 0.2 or 1.0 E.U./dL   Nitrite, UA negative    Leukocytes, UA Negative Negative   Lab Results  Component Value Date   CREATININE 0.79 09/10/2018   BUN 17 09/10/2018   NA 133 (L) 09/10/2018   K 5.1 09/10/2018   CL 98 09/10/2018   CO2 27 09/10/2018    Lab Results  Component Value Date   ALT 26 09/10/2018   AST 31 09/10/2018   ALKPHOS 72 09/10/2018   BILITOT 0.5 09/10/2018    Assessment & Plan:   Problem List Items Addressed This Visit    Bowel habit changes - Primary    Associated with cramping after bowel movements. Not consistent with IBS or diverticulitis or C diff colitis. ?med related - will change metformin to XR 750mg  2 tab in am. rec lactose free diet x 1 wk to monitor symptoms. rec avoid gas producing foods. Encouraged she complete iFOB - as overdue for colon cancer screening.  No endorsed weight loss, blood in stool.   Pt agrees with plan.           Meds ordered this encounter  Medications  . metFORMIN (GLUCOPHAGE XR) 750 MG 24 hr tablet    Sig: Take 2 tablets (1,500 mg total) by mouth daily with breakfast.    Dispense:  60 tablet    Refill:  3    In place of metformin IR 1000mg    No orders of the defined types were placed  in this encounter.   Follow up plan: No follow-ups on file.  [name], MD

## 2018-10-13 ENCOUNTER — Encounter: Payer: Self-pay | Admitting: Family Medicine

## 2018-10-15 ENCOUNTER — Encounter: Payer: Self-pay | Admitting: Family Medicine

## 2018-10-21 ENCOUNTER — Encounter: Payer: Self-pay | Admitting: Family Medicine

## 2018-10-21 DIAGNOSIS — R194 Change in bowel habit: Secondary | ICD-10-CM

## 2018-10-21 DIAGNOSIS — R109 Unspecified abdominal pain: Secondary | ICD-10-CM

## 2018-10-23 NOTE — Telephone Encounter (Signed)
Labs scheduled on 10/24/18 at 10:30.

## 2018-10-23 NOTE — Telephone Encounter (Signed)
plz call to schedule curbside labs asap.

## 2018-10-24 ENCOUNTER — Other Ambulatory Visit: Payer: Self-pay

## 2018-10-26 ENCOUNTER — Encounter: Payer: Self-pay | Admitting: Family Medicine

## 2018-10-26 ENCOUNTER — Other Ambulatory Visit: Payer: Self-pay

## 2018-10-26 DIAGNOSIS — N644 Mastodynia: Secondary | ICD-10-CM

## 2018-10-29 ENCOUNTER — Other Ambulatory Visit (HOSPITAL_COMMUNITY): Payer: Self-pay | Admitting: *Deleted

## 2018-10-29 ENCOUNTER — Other Ambulatory Visit: Payer: Self-pay

## 2018-10-29 DIAGNOSIS — N644 Mastodynia: Secondary | ICD-10-CM

## 2018-11-01 ENCOUNTER — Ambulatory Visit (HOSPITAL_COMMUNITY)
Admission: EM | Admit: 2018-11-01 | Discharge: 2018-11-01 | Disposition: A | Discharge status: Home / Self Care | Payer: Self-pay | Attending: Emergency Medicine | Admitting: Emergency Medicine

## 2018-11-01 ENCOUNTER — Other Ambulatory Visit (HOSPITAL_COMMUNITY): Payer: Self-pay | Admitting: *Deleted

## 2018-11-01 ENCOUNTER — Encounter (HOSPITAL_COMMUNITY): Payer: Self-pay | Admitting: Emergency Medicine

## 2018-11-01 ENCOUNTER — Ambulatory Visit (INDEPENDENT_AMBULATORY_CARE_PROVIDER_SITE_OTHER): Payer: Self-pay

## 2018-11-01 DIAGNOSIS — N644 Mastodynia: Secondary | ICD-10-CM

## 2018-11-01 DIAGNOSIS — K5900 Constipation, unspecified: Secondary | ICD-10-CM

## 2018-11-01 LAB — POCT URINALYSIS DIP (DEVICE)
Bilirubin Urine: NEGATIVE
Glucose, UA: 500 mg/dL — AB
Leukocytes,Ua: NEGATIVE
Nitrite: NEGATIVE
Protein, ur: 100 mg/dL — AB
Specific Gravity, Urine: 1.025 (ref 1.005–1.030)
Urobilinogen, UA: 2 mg/dL — ABNORMAL HIGH (ref 0.0–1.0)
pH: 5.5 (ref 5.0–8.0)

## 2018-11-01 NOTE — ED Provider Notes (Signed)
HPI  SUBJECTIVE:  Karen [name] is a 63 y.o. female who presents with 1 month of constipation.  States that she had a bowel movement yesterday and felt like she was able to completely empty her bowels.  She reports hard stool and decreased frequency of stooling.  She states that she does not drink any water, rather she drinks tea and soda.  No nausea, vomiting, fevers, melena, hematochezia.  No opiates or Imodium in a regular basis.  No urinary complaints.  She also reports occasional right low back pain that radiates into the right flank and suprapubic area for the past month.  Describes it as crampy, lasting hours.  It occurs after having a bowel movement, urination.  It has not moved or changed since it started 1 month ago.  No unintentional weight loss.  No other abdominal pain.  Back pain is not associated with movement.  She denies trauma to the back.  No saddle anesthesia, urinary fecal incontinence, numbness or tingling in her legs, pain worse at night.  She tried Tylenol, heating pad, tramadol with improvement in her symptoms.  Her back pain is worse with urination, defecation.  She has taken Tylenol within 4 to 6 hours of evaluation.  She has not tried any NSAIDs per MD orders.  She has a past medical history of diabetes, hypertension, constipation, coronary artery disease status post angioplasty, ovarian cyst, ectopic surgery.  No history of gastroparesis, other abdominal surgeries, UTI, pyelonephritis, nephrolithiasis, gallstones, gallbladder disease.  Family history significant for sister and brother with nephrolithiasis.    Saw her primary care physician for this recently had a negative UA/urine culture.  HQI:[id number], Garlon Hatchet, MD   Past Medical History:  Diagnosis Date  . Body aches 03/24/2016   Resolved off simvastatin.   Marland Kitchen CAD (coronary artery disease)    STEMI 07/2013 - treated with PTCA of the RCA followed by DES to the intermediate ramus  //  Myoview 5/17 - Normal perfusion. LVEF 71%  with normal wall motion. This is a low risk study.  . Chronic bronchitis   . Depression   . GERD (gastroesophageal reflux disease)   . High blood pressure   . History of chicken pox   . History of ovarian cyst   . HLD (hyperlipidemia)   . Seasonal allergies   . Seizure ([place]) 1977   off AEDs  . SVT (supraventricular tachycardia) ([place]) 10/2015   s/p catheter ablation  . Syncopal episodes   . Tachyarrhythmia    ER visits with adenosine push to control  . Tubal pregnancy   . Type 2 diabetes, uncontrolled, with neuropathy ([place])    dx 1993    Past Surgical History:  Procedure Laterality Date  . ABLATION OF DYSRHYTHMIC FOCUS  11/06/2015   SVT  . CARDIAC CATHETERIZATION  07/2013   angioplasty to RCA and DES to ramus intermedius  . DILATION AND CURETTAGE OF UTERUS  1984  . ECTOPIC PREGNANCY SURGERY Right 1984   tube removal 2/2 hemorrhage  . ELECTROPHYSIOLOGIC STUDY N/A 11/06/2015   SVT Ablation;  Evans Lance, MD;  Location: [facility]  . LEFT HEART CATHETERIZATION WITH CORONARY ANGIOGRAM N/A 07/23/2013   Procedure: LEFT HEART CATHETERIZATION WITH CORONARY ANGIOGRAM;  Surgeon: Sinclair Grooms, MD;  Location: [facility] CATH LAB;  Service: Cardiovascular;  Laterality: N/A;  . OVARIAN CYST SURGERY  1998  . PERCUTANEOUS CORONARY STENT INTERVENTION (PCI-S) N/A 07/24/2013   Procedure: PERCUTANEOUS CORONARY STENT INTERVENTION (PCI-S);  Surgeon: Lynnell Dike  Blenda Bridegroom, MD;  Location: [facility] CATH LAB;  Service: Cardiovascular;  Laterality: N/A;  . ROTATOR CUFF REPAIR  2006   Right  . SHOULDER ARTHROSCOPY Left 02/2017   LEFT shoulder arthroscopy with limited debridement, decompression/acromioplasty, open rotator cuff repair and biceps tenodesis ([facility])  . SUBMANDIBULAR DUCT LIGATION Right 10/2014   R submandibular gland stone removed [facility])  . TONSILLECTOMY AND ADENOIDECTOMY  1965    Family History  Problem Relation Age of Onset  . Diabetes Mother   . Hyperlipidemia Mother   . Hypertension  Mother   . Stroke Mother   . Coronary artery disease Mother        CABG  . Heart disease Mother   . Diabetes Father   . Hypertension Father   . Hyperlipidemia Father   . Coronary artery disease Father        CABG  . Congestive Heart Failure Father   . Pancreatic cancer Father   . Arrhythmia Brother   . Diabetes Maternal Grandmother   . Diabetes Maternal Grandfather   . Diabetes Paternal Grandmother   . Diabetes Paternal Grandfather   . Heart disease Brother   . Kidney disease Neg Hx     Social History   Tobacco Use  . Smoking status: Never Smoker  . Smokeless tobacco: Never Used  Substance Use Topics  . Alcohol use: No  . Drug use: No    No current facility-administered medications for this encounter.   Current Outpatient Medications:  .  acetaminophen (TYLENOL 8 HOUR) 650 MG CR tablet, Take 2 tablets (1,300 mg total) by mouth 2 (two) times daily as needed for pain., Disp: , Rfl:  .  aspirin EC 81 MG EC tablet, Take 1 tablet (81 mg total) by mouth daily., Disp: , Rfl:  .  furosemide (LASIX) 20 MG tablet, Take 1 tablet (20 mg total) by mouth daily as needed for edema., Disp: 30 tablet, Rfl: 3 .  gabapentin (NEURONTIN) 300 MG capsule, Take 1 capsule (300 mg total) by mouth at bedtime., Disp: 90 capsule, Rfl: 3 .  glipiZIDE (GLUCOTROL) 5 MG tablet, Take 0.5-1 tablets (2.5-5 mg total) by mouth 2 (two) times daily. Depending on meal size, Disp: 180 tablet, Rfl: 0 .  Insulin Glargine (LANTUS SOLOSTAR) 100 UNIT/ML Solostar Pen, Inject 60 Units into the skin 2 (two) times daily. QS 1 mo, Disp: 12 pen, Rfl: 11 .  Insulin Pen Needle (PEN NEEDLES) 31G X 8 MM MISC, Use to inject Lantus as directed twice daily. Dx: E11.40, Disp: 60 each, Rfl: 3 .  insulin regular (NOVOLIN R) 100 units/mL injection, Inject 0.1 mLs (10 Units total) into the skin 3 (three) times daily before meals., Disp: 10 mL, Rfl: 11 .  KLOR-CON SPRINKLE 10 MEQ CR capsule, Take 1 capsule (10 mEq total) by mouth daily as  needed. When taking Lasix, Disp: 30 capsule, Rfl: 3 .  losartan (COZAAR) 25 MG tablet, Take 1 tablet (25 mg total) by mouth daily., Disp: 90 tablet, Rfl: 3 .  metFORMIN (GLUCOPHAGE XR) 750 MG 24 hr tablet, Take 2 tablets (1,500 mg total) by mouth daily with breakfast., Disp: 60 tablet, Rfl: 3 .  metFORMIN (GLUCOPHAGE) 1000 MG tablet, Take 1 tablet (1,000 mg total) by mouth 2 (two) times daily with a meal., Disp: 180 tablet, Rfl: 0 .  nitroGLYCERIN (NITROSTAT) 0.4 MG SL tablet, Place 1 tablet (0.4 mg total) under the tongue every 5 (five) minutes as needed for chest pain., Disp: 25 tablet, Rfl: 0 .  omeprazole (PRILOSEC) 40 MG capsule, Take 1 capsule (40 mg total) by mouth daily., Disp: 30 capsule, Rfl: 11 .  rosuvastatin (CRESTOR) 5 MG tablet, Take 1 tablet (5 mg total) by mouth daily., Disp: 30 tablet, Rfl: 6 .  sertraline (ZOLOFT) 50 MG tablet, Take 1 tablet (50 mg total) by mouth daily., Disp: 30 tablet, Rfl: 11  Allergies  Allergen Reactions  . Clindamycin/Lincomycin Nausea And Vomiting and Other (See Comments)    Pt refuses to retry  . Erythromycin Other (See Comments)    Stomach cramps  . Lipitor [Atorvastatin] Nausea Only  . Penicillins Rash and Other (See Comments)    Has patient had a PCN reaction causing immediate rash, facial/tongue/throat swelling, SOB or lightheadedness with hypotension: Yes Has patient had a PCN reaction causing severe rash involving mucus membranes or skin necrosis: No Has patient had a PCN reaction that required hospitalization No Has patient had a PCN reaction occurring within the last 10 years: No If all of the above answers are "NO", then may proceed with Cephalosporin use.       ROS  As noted in HPI.   Physical Exam  BP (!) 164/73   Pulse 82   Temp 97.7 F (36.5 C)   Resp 16   SpO2 100%   Constitutional: Well developed, well nourished, no acute distress Eyes:  EOMI, conjunctiva normal bilaterally HENT: Normocephalic, atraumatic,mucus  membranes moist Respiratory: Normal inspiratory effort Cardiovascular: Normal rate GI: nondistended soft,.  Mild right flank tenderness.  No suprapubic tenderness.  Negative Murphy, negative McBurney.  Active bowel sounds. Back: Mild right CVAT.  Bilateral paralumbar tenderness.  No L-spine tenderness. - muscle spasm. No bony tenderness. Bilateral lower extremities painless ROM with intact PT pulses. No pain with int/ext rotation flex/extension hips bilaterally. SLR neg bilaterally. Sensation baseline light touch bilaterally for Pt, DTR's symmetric and intact bilaterally KJ , Motor symmetric bilateral 5/5 hip flexion, quadriceps, hamstrings, EHL, foot dorsiflexion, foot plantarflexion skin: No rash, skin intact Musculoskeletal: no deformities Neurologic: Alert & oriented x 3, no focal neuro deficits Psychiatric: Speech and behavior appropriate   ED Course   Medications - No data to display  Orders Placed This Encounter  Procedures  . DG Abd 1 View    Standing Status:   Standing    Number of Occurrences:   1    Order Specific Question:   Reason for Exam (SYMPTOM  OR DIAGNOSIS REQUIRED)    Answer:   R sided R flank pain 1 mo r/o neprholithiasis  . POCT urinalysis dip (device)    Standing Status:   Standing    Number of Occurrences:   1    No results found for this or any previous visit (from the past 24 hour(s)). Dg Abd 1 View  Result Date: 11/01/2018 CLINICAL DATA:  Abdominal and back pain for a month, cramps, constipation. RIGHT flank pain for a month. Rule out nephrolithiasis. EXAM: ABDOMEN - 1 VIEW COMPARISON:  None. FINDINGS: Overall visualized bowel gas pattern is nonobstructive. Fairly large amount of stool within the visualized portion of the colon. No evidence of renal or ureteral calculi appreciated. No acute or suspicious osseous finding. IMPRESSION: 1. Fairly large amount of stool within the visualized portion of the colon, compatible with the given history of constipation.  2. No renal or ureteral calculi seen. Electronically Signed   By: Franki Cabot M.D.   On: 11/01/2018 09:24    ED Clinical Impression  Constipation, unspecified constipation type -   ED  Assessment/Plan  Suspect nephrolithiasis versus constipation.  No evidence of surgical abdomen.  Will check a UA and a KUB.  Does not appear to be musculoskeletal as it is not aggravated with movement.  No evidence of cauda equina syndrome.  UA significant for glucosurea, trace ketones, hematuria, proteinuria.  No evidence of UTI.  Suspect that the glucosuria is from poorly controlled diabetes.  Reviewed imaging independently.  Extensive stool.  Nonobstructive bowel gas pattern. no renal or ureteral calculi.  See radiology report for full details.  Presentation consistent with constipation. will start patient on MiraLAX, increase water intake.  Will have patient follow-up with PMD for repeat urine.  Discussed labs, imaging, MDM, treatment plan, and plan for follow-up with patient. Discussed sn/sx that should prompt return to the ED. patient agrees with plan.   No orders of the defined types were placed in this encounter.   *This clinic note was created using Dragon dictation software. Therefore, there may be occasional mistakes despite careful proofreading.   ?   Melynda Ripple, MD 11/02/18 [phone]

## 2018-11-01 NOTE — Discharge Instructions (Addendum)
Drink extra fluids.  You need to drink at least 2 L of water a day.  It may take up to 3 days for the miralax to take effect. may also drink prune and apple juice.  Follow-up with your primary care physician as soon as you can for repeat urinalysis.  Return if you have a fever, if you start having severe abdominal pain, a fever >100.4, or any other concerns.   Go to www.goodrx.com to look up your medications. This will give you a list of where you can find your prescriptions at the most affordable prices. Or ask the pharmacist what the cash price is, or if they have any other discount programs available to help make your medication more affordable. This can be less expensive than what you would pay with insurance.

## 2018-11-01 NOTE — ED Triage Notes (Signed)
Pt c/o constipation, pt also c/o R flank pain off and on for a month.

## 2018-11-02 ENCOUNTER — Emergency Department (HOSPITAL_COMMUNITY)
Admission: EM | Admit: 2018-11-02 | Discharge: 2018-11-03 | Disposition: A | Discharge status: Home / Self Care | Payer: Self-pay | Attending: Emergency Medicine | Admitting: Emergency Medicine

## 2018-11-02 ENCOUNTER — Encounter (HOSPITAL_COMMUNITY): Payer: Self-pay

## 2018-11-02 ENCOUNTER — Other Ambulatory Visit: Payer: Self-pay

## 2018-11-02 ENCOUNTER — Emergency Department (HOSPITAL_COMMUNITY): Payer: Self-pay

## 2018-11-02 DIAGNOSIS — R1030 Lower abdominal pain, unspecified: Secondary | ICD-10-CM | POA: Insufficient documentation

## 2018-11-02 DIAGNOSIS — Z794 Long term (current) use of insulin: Secondary | ICD-10-CM | POA: Insufficient documentation

## 2018-11-02 DIAGNOSIS — N281 Cyst of kidney, acquired: Secondary | ICD-10-CM | POA: Insufficient documentation

## 2018-11-02 DIAGNOSIS — M545 Low back pain: Secondary | ICD-10-CM | POA: Insufficient documentation

## 2018-11-02 DIAGNOSIS — I2510 Atherosclerotic heart disease of native coronary artery without angina pectoris: Secondary | ICD-10-CM | POA: Insufficient documentation

## 2018-11-02 DIAGNOSIS — Z7982 Long term (current) use of aspirin: Secondary | ICD-10-CM | POA: Insufficient documentation

## 2018-11-02 DIAGNOSIS — Z79899 Other long term (current) drug therapy: Secondary | ICD-10-CM | POA: Insufficient documentation

## 2018-11-02 DIAGNOSIS — M549 Dorsalgia, unspecified: Secondary | ICD-10-CM

## 2018-11-02 DIAGNOSIS — I10 Essential (primary) hypertension: Secondary | ICD-10-CM | POA: Insufficient documentation

## 2018-11-02 DIAGNOSIS — K5900 Constipation, unspecified: Secondary | ICD-10-CM | POA: Insufficient documentation

## 2018-11-02 DIAGNOSIS — E119 Type 2 diabetes mellitus without complications: Secondary | ICD-10-CM | POA: Insufficient documentation

## 2018-11-02 LAB — URINALYSIS, ROUTINE W REFLEX MICROSCOPIC
Bacteria, UA: NONE SEEN
Bilirubin Urine: NEGATIVE
Glucose, UA: >=500 mg/dL — AB
Hgb urine dipstick: NEGATIVE
Ketones, ur: NEGATIVE mg/dL
Leukocytes,Ua: NEGATIVE
Nitrite: NEGATIVE
Protein, ur: 30 mg/dL — AB
Specific Gravity, Urine: 1.028 (ref 1.005–1.030)
pH: 5 (ref 5.0–8.0)

## 2018-11-02 LAB — CBC
HCT: 43.4 % (ref 36.0–46.0)
Hemoglobin: 14.1 g/dL (ref 12.0–15.0)
MCH: 28.4 pg (ref 26.0–34.0)
MCHC: 32.5 g/dL (ref 30.0–36.0)
MCV: 87.5 fL (ref 80.0–100.0)
Platelets: 126 10*3/uL — ABNORMAL LOW (ref 150–400)
RBC: 4.96 MIL/uL (ref 3.87–5.11)
RDW: 13 % (ref 11.5–15.5)
WBC: 4.7 10*3/uL (ref 4.0–10.5)
nRBC: 0 % (ref 0.0–0.2)

## 2018-11-02 LAB — COMPREHENSIVE METABOLIC PANEL
ALT: 44 U/L (ref 0–44)
AST: 38 U/L (ref 15–41)
Albumin: 4.1 g/dL (ref 3.5–5.0)
Alkaline Phosphatase: 96 U/L (ref 38–126)
Anion gap: 11 (ref 5–15)
BUN: 16 mg/dL (ref 8–23)
CO2: 23 mmol/L (ref 22–32)
Calcium: 9.8 mg/dL (ref 8.9–10.3)
Chloride: 101 mmol/L (ref 98–111)
Creatinine, Ser: 0.68 mg/dL (ref 0.44–1.00)
GFR calc Af Amer: >60 mL/min (ref >60)
GFR calc non Af Amer: >60 mL/min (ref >60)
Glucose, Bld: 357 mg/dL — ABNORMAL HIGH (ref 70–99)
Potassium: 4.6 mmol/L (ref 3.5–5.1)
Sodium: 135 mmol/L (ref 135–145)
Total Bilirubin: 0.8 mg/dL (ref 0.3–1.2)
Total Protein: 7.2 g/dL (ref 6.5–8.1)

## 2018-11-02 LAB — LIPASE, BLOOD: Lipase: 41 U/L (ref 11–51)

## 2018-11-02 MED ORDER — SODIUM CHLORIDE 0.9% FLUSH: 3.0000 mL | Freq: Once | INTRAVENOUS | Status: DC

## 2018-11-02 MED ORDER — FENTANYL CITRATE (PF) 100 MCG/2ML IJ SOLN
50.0000 ug | Freq: Once | INTRAMUSCULAR | Status: DC
  Filled 2018-11-02: qty 2

## 2018-11-02 MED ORDER — ONDANSETRON HCL 4 MG/2ML IJ SOLN
4.0000 mg | Freq: Once | INTRAMUSCULAR | Status: DC
  Filled 2018-11-02: qty 2

## 2018-11-02 MED ORDER — IOHEXOL 300 MG/ML  SOLN
100.0000 mL | Freq: Once | INTRAMUSCULAR | Status: AC | PRN
  Administered 2018-11-02: 100 mL via INTRAVENOUS

## 2018-11-02 NOTE — ED Triage Notes (Signed)
Pt reports lower back pain that radiates to ger right flank and abd. Seen at [facility] yesterday and told she was very constipated, given rx for miralax and has only had 1 BM since. Denies n/v

## 2018-11-02 NOTE — ED Notes (Signed)
Patient transported to CT 

## 2018-11-02 NOTE — ED Provider Notes (Signed)
[facility] EMERGENCY DEPARTMENT Provider Note   CSN: [id number] Arrival date & time: 11/02/18  2012    History   Chief Complaint Chief Complaint  Patient presents with  . Back Pain  . Abdominal Pain    HPI Karen [name] is a 63 y.o. female some history of hypertension, diabetes, SVT, tachyarrhythmia who presents for evaluation of 1 month of constipation, lower abdominal pain, back pain, worse on the right side.  She states that for about a month, she has had intermittent pain in her lower back and lower abdomen that she describes as a "cramping" type pain.  She has been taking Tylenol with no improvements.  Additionally, she states that she had constipation over the last month.  He states normally prior to onset of symptoms, she was regular.  She reports that over the last month, she has had a smaller stools and has had difficulty.  She has not noted any bright red blood or melena.  Patient states that she went to urgent care on 11/01/2018 for evaluation of symptoms.  She reports that work-up at that time was unremarkable.  She was told that she was constipated and was discharged with MiraLAX.  She states she took MiraLAX and had 1 bowel movement yesterday but states that she has not been able to go since then.  Patient states she comes in the emergency department today because while at home, the pain seemed to intensify.  She states that it is more severe.  She has not noted any nausea/vomiting, chest pain, difficulty breathing, dysuria, hematuria, fevers, night sweats, weight loss.     The history is provided by the patient.    Past Medical History:  Diagnosis Date  . Body aches 03/24/2016   Resolved off simvastatin.   Marland Kitchen CAD (coronary artery disease)    STEMI 07/2013 - treated with PTCA of the RCA followed by DES to the intermediate ramus  //  Myoview 5/17 - Normal perfusion. LVEF 71% with normal wall motion. This is a low risk study.  . Chronic bronchitis   .  Depression   . GERD (gastroesophageal reflux disease)   . High blood pressure   . History of chicken pox   . History of ovarian cyst   . HLD (hyperlipidemia)   . Seasonal allergies   . Seizure ([place]) 1977   off AEDs  . SVT (supraventricular tachycardia) ([place]) 10/2015   s/p catheter ablation  . Syncopal episodes   . Tachyarrhythmia    ER visits with adenosine push to control  . Tubal pregnancy   . Type 2 diabetes, uncontrolled, with neuropathy ([place])    dx 1993    Patient Active Problem List   Diagnosis Date Noted  . Bowel habit changes 10/10/2018  . Constipation 10/02/2018  . Dysuria 09/12/2018  . Right arm numbness 03/24/2016  . BPPV (benign paroxysmal positional vertigo) 01/11/2016  . Cough due to ACE inhibitor 12/22/2015  . SVT (supraventricular tachycardia) ([place]) 11/06/2015  . Paroxysmal SVT (supraventricular tachycardia) ([place]) 09/15/2015  . Pain, dental 05/14/2014  . CMC arthritis, thumb, degenerative 02/21/2014  . Sprain of left thumb 02/06/2014  . Diabetic retinopathy ([place]) 12/31/2013  . Dyspnea 10/01/2013  . CAD S/P percutaneous coronary angioplasty 07/23/2013  . Health maintenance examination 06/10/2013  . Vertigo 04/25/2013  . Lumbar radiculopathy 03/16/2011  . MDD (major depressive disorder), recurrent episode, moderate ([place])   . Type 2 diabetes, uncontrolled, with neuropathy ([place])   . GERD (gastroesophageal reflux disease)   .  Seasonal allergies   . HTN (hypertension)   . Dyslipidemia     Past Surgical History:  Procedure Laterality Date  . ABLATION OF DYSRHYTHMIC FOCUS  11/06/2015   SVT  . CARDIAC CATHETERIZATION  07/2013   angioplasty to RCA and DES to ramus intermedius  . DILATION AND CURETTAGE OF UTERUS  1984  . ECTOPIC PREGNANCY SURGERY Right 1984   tube removal 2/2 hemorrhage  . ELECTROPHYSIOLOGIC STUDY N/A 11/06/2015   SVT Ablation;  Evans Lance, MD;  Location: [facility]  . LEFT HEART CATHETERIZATION WITH CORONARY ANGIOGRAM N/A  07/23/2013   Procedure: LEFT HEART CATHETERIZATION WITH CORONARY ANGIOGRAM;  Surgeon: Sinclair Grooms, MD;  Location: [facility] C[facility] LAB;  Service: Cardiovascular;  Laterality: N/A;  . OVARIAN CYST SURGERY  1998  . PERCUTANEOUS CORONARY STENT INTERVENTION (PCI-S) N/A 07/24/2013   Procedure: PERCUTANEOUS CORONARY STENT INTERVENTION (PCI-S);  Surgeon: Sinclair Grooms, MD;  Location: [facility] CATH LAB;  Service: Cardiovascular;  Laterality: N/A;  . ROTATOR CUFF REPAIR  2006   Right  . SHOULDER ARTHROSCOPY Left 02/2017   LEFT shoulder arthroscopy with limited debridement, decompression/acromioplasty, open rotator cuff repair and biceps tenodesis ([facility])  . SUBMANDIBULAR DUCT LIGATION Right 10/2014   R submandibular gland stone removed [facility])  . TONSILLECTOMY AND ADENOIDECTOMY  1965     OB History   No obstetric history on file.      Home Medications    Prior to Admission medications   Medication Sig Start Date End Date Taking? Authorizing Provider  acetaminophen (TYLENOL) 500 MG tablet Take 1,000 mg by mouth 3 (three) times daily as needed for mild pain.   Yes [provider]  aspirin EC 81 MG EC tablet Take 1 tablet (81 mg total) by mouth daily. 07/25/13  Yes Turner, Eber Hong, MD  furosemide (LASIX) 20 MG tablet Take 1 tablet (20 mg total) by mouth daily as needed for edema. 05/31/18  Yes [name], MD  gabapentin (NEURONTIN) 300 MG capsule Take 1 capsule (300 mg total) by mouth at bedtime. 11/07/17  Yes [name], MD  glipiZIDE (GLUCOTROL) 5 MG tablet Take 0.5-1 tablets (2.5-5 mg total) by mouth 2 (two) times daily. Depending on meal size 08/09/18  Yes [name], MD  Insulin Glargine (LANTUS SOLOSTAR) 100 UNIT/ML Solostar Pen Inject 60 Units into the skin 2 (two) times daily. QS 1 mo 07/16/15  Yes [name], MD  insulin regular (NOVOLIN R) 100 units/mL injection Inject 0.1 mLs (10 Units total) into the skin 3 (three) times daily before meals. Patient taking  differently: Inject 12 Units into the skin 3 (three) times daily before meals.  12/26/17  Yes [name], MD  KLOR-CON SPRINKLE 10 MEQ CR capsule Take 1 capsule (10 mEq total) by mouth daily as needed. When taking Lasix 05/31/18  Yes [name], MD  losartan (COZAAR) 25 MG tablet Take 1 tablet (25 mg total) by mouth daily. 05/31/18  Yes [name], MD  metFORMIN (GLUCOPHAGE XR) 750 MG 24 hr tablet Take 2 tablets (1,500 mg total) by mouth daily with breakfast. Patient taking differently: Take 1,500 mg by mouth every evening.  10/10/18  Yes [name], MD  nitroGLYCERIN (NITROSTAT) 0.4 MG SL tablet Place 1 tablet (0.4 mg total) under the tongue every 5 (five) minutes as needed for chest pain. 12/22/17  Yes [name], MD  omeprazole (PRILOSEC) 40 MG capsule Take 1 capsule (40 mg total) by mouth daily. 09/12/18  Yes [name], MD  rosuvastatin (CRESTOR) 5 MG tablet Take 1 tablet (5 mg total) by mouth daily. 05/31/18  Yes [name], MD  sertraline (ZOLOFT) 50 MG tablet Take 1 tablet (50 mg total) by mouth daily. 09/12/18  Yes [name], MD  acetaminophen (TYLENOL 8 HOUR) 650 MG CR tablet Take 2 tablets (1,300 mg total) by mouth 2 (two) times daily as needed for pain. Patient not taking: Reported on 11/02/2018 11/07/17   [name], MD  dicyclomine (BENTYL) 20 MG tablet Take 1 tablet (20 mg total) by mouth daily. 11/03/18   Volanda Napoleon, PA-C  Insulin Pen Needle (PEN NEEDLES) 31G X 8 MM MISC Use to inject Lantus as directed twice daily. Dx: E11.40 08/18/15   [name], MD  metFORMIN (GLUCOPHAGE) 1000 MG tablet Take 1 tablet (1,000 mg total) by mouth 2 (two) times daily with a meal. Patient not taking: Reported on 11/02/2018 08/09/18   [name], MD    Family History Family History  Problem Relation Age of Onset  . Diabetes Mother   . Hyperlipidemia Mother   . Hypertension Mother   . Stroke Mother   . Coronary artery disease  Mother        CABG  . Heart disease Mother   . Diabetes Father   . Hypertension Father   . Hyperlipidemia Father   . Coronary artery disease Father        CABG  . Congestive Heart Failure Father   . Pancreatic cancer Father   . Arrhythmia Brother   . Diabetes Maternal Grandmother   . Diabetes Maternal Grandfather   . Diabetes Paternal Grandmother   . Diabetes Paternal Grandfather   . Heart disease Brother   . Kidney disease Neg Hx     Social History Social History   Tobacco Use  . Smoking status: Never Smoker  . Smokeless tobacco: Never Used  Substance Use Topics  . Alcohol use: No  . Drug use: No     Allergies   Clindamycin/lincomycin, Erythromycin, Lipitor [atorvastatin], and Penicillins   Review of Systems Review of Systems  Constitutional: Negative for diaphoresis, fever and unexpected weight change.  Respiratory: Negative for cough and shortness of breath.   Cardiovascular: Negative for chest pain.  Gastrointestinal: Positive for abdominal pain and constipation. Negative for nausea and vomiting.  Genitourinary: Negative for dysuria and hematuria.  Neurological: Negative for headaches.  All other systems reviewed and are negative.    Physical Exam Updated Vital Signs BP (!) 145/78   Pulse 96   Temp 98.9 F (37.2 C) (Oral)   Resp 16   Ht 5\' 1"  (1.549 m)   Wt 82.2 kg   SpO2 97%   BMI 34.24 kg/m   Physical Exam Vitals signs and nursing note reviewed. Exam conducted with a chaperone present.  Constitutional:      Appearance: Normal appearance. She is well-developed.  HENT:     Head: Normocephalic and atraumatic.  Eyes:     General: Lids are normal.     Conjunctiva/sclera: Conjunctivae normal.     Pupils: Pupils are equal, round, and reactive to light.  Neck:     Musculoskeletal: Full passive range of motion without pain.  Cardiovascular:     Rate and Rhythm: Normal rate and regular rhythm.     Pulses: Normal pulses.     Heart sounds: Normal  heart sounds. No murmur. No friction rub. No gallop.   Pulmonary:     Effort: Pulmonary effort is normal.  Breath sounds: Normal breath sounds.     Comments: Lungs clear to auscultation bilaterally.  Symmetric chest rise.  No wheezing, rales, rhonchi. Abdominal:     Palpations: Abdomen is soft. Abdomen is not rigid.     Tenderness: There is abdominal tenderness in the right lower quadrant, suprapubic area and left lower quadrant. There is right CVA tenderness. There is no guarding.     Comments: Tenderness to palpation noted to the lower abdomen bilaterally, most notably on the right side.  No focal point.  No McBurney's point tenderness.  No rigidity, guarding.  Right-sided CVA tenderness noted.  Genitourinary:    Comments: The exam was performed with a chaperone present. Digital Rectal Exam reveals sphincter with good tone. No external hemorrhoids. No masses or fissures. Stool color is brown with no overt blood.  No evidence of fecal impaction. Musculoskeletal: Normal range of motion.       Arms:  Skin:    General: Skin is warm and dry.     Capillary Refill: Capillary refill takes less than 2 seconds.  Neurological:     Mental Status: She is alert and oriented to person, place, and time.  Psychiatric:        Speech: Speech normal.      ED Treatments / Results  Labs (all labs ordered are listed, but only abnormal results are displayed) Labs Reviewed  COMPREHENSIVE METABOLIC PANEL - Abnormal; Notable for the following components:      Result Value   Glucose, Bld 357 (*)    All other components within normal limits  CBC - Abnormal; Notable for the following components:   Platelets 126 (*)    All other components within normal limits  URINALYSIS, ROUTINE W REFLEX MICROSCOPIC - Abnormal; Notable for the following components:   Glucose, UA >=500 (*)    Protein, ur 30 (*)    All other components within normal limits  LIPASE, BLOOD    EKG None  Radiology Dg Abd 1 View   Result Date: 11/01/2018 CLINICAL DATA:  Abdominal and back pain for a month, cramps, constipation. RIGHT flank pain for a month. Rule out nephrolithiasis. EXAM: ABDOMEN - 1 VIEW COMPARISON:  None. FINDINGS: Overall visualized bowel gas pattern is nonobstructive. Fairly large amount of stool within the visualized portion of the colon. No evidence of renal or ureteral calculi appreciated. No acute or suspicious osseous finding. IMPRESSION: 1. Fairly large amount of stool within the visualized portion of the colon, compatible with the given history of constipation. 2. No renal or ureteral calculi seen. Electronically Signed   By: Franki Cabot M.D.   On: 11/01/2018 09:24   Ct Abdomen Pelvis W Contrast  Result Date: 11/02/2018 CLINICAL DATA:  Low back pain which radiates to the right flank. EXAM: CT ABDOMEN AND PELVIS WITH CONTRAST CT lumbosacral spine TECHNIQUE: Multidetector CT imaging of the abdomen and pelvis was performed using the standard protocol following bolus administration of intravenous contrast. CONTRAST:  126mL OMNIPAQUE IOHEXOL 300 MG/ML  SOLN COMPARISON:  11/02/2018 FINDINGS: Lower chest: No acute abnormality. Hepatobiliary: Nodular contour of the liver. Recanalization of the umbilical vein. No gallstones, gallbladder wall thickening, or biliary dilatation. Pancreas: Unremarkable. No pancreatic ductal dilatation or surrounding inflammatory changes. Spleen: Normal in size without focal abnormality. Adrenals/Urinary Tract: Normal adrenal glands. Normal right kidney. 10 mm benign-appearing left renal cyst. Normal ureters and urinary bladder. Stomach/Bowel: Stomach is within normal limits. No evidence of appendicitis. No evidence of small bowel wall thickening, distention, or inflammatory changes.  Circumferential thickening of the rectum Vascular/Lymphatic: Aortic atherosclerosis. No enlarged abdominal or pelvic lymph nodes. Reproductive: Uterus and bilateral adnexa are unremarkable. Other: No  abdominal wall hernia or abnormality. No abdominopelvic ascites. Lumbosacral spine: Normal osseous mineralization. Normal height and alignment of the vertebral bodies. Five non rib-bearing vertebral bodies. No significant spondylosis or osseous neural foraminal narrowing. IMPRESSION: 1. Nodular contour of the liver, consistent with cirrhosis. 2. Recanalization of the umbilical vein, which may be seen with portal hypertension. 3. Circumferential thickening of the rectum, significance uncertain. Correlation with colonoscopy on a nonemergent basis may be considered. 4. Normal appearance of the lumbosacral spine. Electronically Signed   By: Fidela Salisbury M.D.   On: 11/02/2018 22:50    Procedures Procedures (including critical care time)  Medications Ordered in ED Medications  sodium chloride flush (NS) 0.9 % injection 3 mL (has no administration in time range)  fentaNYL (SUBLIMAZE) injection 50 mcg (has no administration in time range)  ondansetron (ZOFRAN) injection 4 mg (has no administration in time range)  iohexol (OMNIPAQUE) 300 MG/ML solution 100 mL (100 mLs Intravenous Contrast Given 11/02/18 2204)     Initial Impression / Assessment and Plan / ED Course  I have reviewed the triage vital signs and the nursing notes.  Pertinent labs & imaging results that were available during my care of the patient were reviewed by me and considered in my medical decision making (see chart for details).         63 year old female who presents for evaluation of lower back pain abdominal pain.  She states is been intermittently occurring over the last month as well as some constipation.  Seen at urgent care yesterday where work-up was unremarkable.  Was told to take MiraLAX which she did.  She had 1 bowel movement but comes in today because she has had worsening pain.  Last bowel movement was yesterday and was small.  No melena, bright red blood. Patient is afebrile, non-toxic appearing, sitting  comfortably on examination table. Vital signs reviewed and stable.  Exam, she has tenderness noted to the lower abdomen diffusely as well as in the lower back, most notably on the right side.  Labs ordered at triage.  Will plan for CT abdomen pelvis.  CMP shows glucose of 357.  Otherwise unremarkable.  UA shows glucose but otherwise no infectious etiology.  Lipase unremarkable.  CBC without any significant leukocytosis or anemia.  Platelets are low at 126.  Review of records show that this is not new.  Workup is not concerning for DKA.  CT abd/pelvis shows left renal cyst.  There is nodular contour of the liver consistent with cirrhosis.  Additionally, she may have some degree of portal hypertension.  There is mention of circumferential thickening of the of the rectum.  Recommend correlation with colonoscopy on a nonemergent basis.  Normal lumbar sacral spine.  Discussed results with patient.  Repeat abdominal exam benign.  Vitals are stable.  Patient did not have any pain medication here in the ED and she states her pain is improved.  Exam shows no evidence of fecal impaction.  Discussed with patient regarding continued efforts on constipation.  I did discuss with patient that given circumferential thickening of the rectum, she will need evaluation via colonoscopy by GI.  Will plan to give her outpatient GI referral. At this time, patient exhibits no emergent life-threatening condition that require further evaluation in ED or admission. Patient had ample opportunity for questions and discussion. All patient's questions were answered  with full understanding. Strict return precautions discussed. Patient expresses understanding and agreement to plan.   Portions of this note were generated with [employer]. Dictation errors may occur despite best attempts at proofreading.   Final Clinical Impressions(s) / ED Diagnoses   Final diagnoses:  Back pain  Constipation, unspecified constipation  type  Cyst of left kidney  Lower abdominal pain    ED Discharge Orders         Ordered    dicyclomine (BENTYL) 20 MG tablet  Daily     11/03/18 0024           Volanda Napoleon, PA-C 11/03/18 1159    Dorie Rank, MD 11/03/18 1550

## 2018-11-02 NOTE — ED Notes (Signed)
Patient denies pain and is resting comfortably. Refused pain meds at this time.

## 2018-11-03 ENCOUNTER — Encounter: Payer: Self-pay | Admitting: Family Medicine

## 2018-11-03 MED ORDER — DICYCLOMINE HCL 20 MG PO TABS: 20.0000 mg | ORAL_TABLET | Freq: Every day | ORAL | 0 refills | Status: DC

## 2018-11-03 NOTE — ED Notes (Signed)
Patient denies pain and is resting comfortably.  

## 2018-11-03 NOTE — Discharge Instructions (Addendum)
As we discussed, your work-up today was reassuring.  Your CT scan did show a left kidney cyst.  This is most likely benign.  Additionally, it showed some thickening of the rectum.  This could be contributing to your symptoms.  As we discussed, you will need to follow-up with GI to get a colonoscopy to further evaluate your symptoms.  I provided referral in the paperwork.  Return to emergency department for any worsening pain, vomiting, fevers or any other worsening or concerning symptoms.

## 2018-11-05 ENCOUNTER — Other Ambulatory Visit: Payer: Self-pay | Admitting: Family Medicine

## 2018-11-05 ENCOUNTER — Ambulatory Visit (INDEPENDENT_AMBULATORY_CARE_PROVIDER_SITE_OTHER): Payer: Self-pay | Admitting: Family Medicine

## 2018-11-05 ENCOUNTER — Encounter: Payer: Self-pay | Admitting: Family Medicine

## 2018-11-05 DIAGNOSIS — E1165 Type 2 diabetes mellitus with hyperglycemia: Secondary | ICD-10-CM

## 2018-11-05 DIAGNOSIS — IMO0002 Reserved for concepts with insufficient information to code with codable children: Secondary | ICD-10-CM

## 2018-11-05 DIAGNOSIS — K629 Disease of anus and rectum, unspecified: Secondary | ICD-10-CM | POA: Insufficient documentation

## 2018-11-05 DIAGNOSIS — K5900 Constipation, unspecified: Secondary | ICD-10-CM

## 2018-11-05 DIAGNOSIS — E1140 Type 2 diabetes mellitus with diabetic neuropathy, unspecified: Secondary | ICD-10-CM

## 2018-11-05 DIAGNOSIS — K7460 Unspecified cirrhosis of liver: Secondary | ICD-10-CM

## 2018-11-05 MED ORDER — DICYCLOMINE HCL 20 MG PO TABS: 20.0000 mg | ORAL_TABLET | Freq: Two times a day (BID) | ORAL | 3 refills | Status: DC | PRN

## 2018-11-05 MED ORDER — METFORMIN HCL ER 750 MG PO TB24: 1500.0000 mg | ORAL_TABLET | Freq: Every evening | ORAL | 6 refills | Status: DC

## 2018-11-05 MED ORDER — INSULIN REGULAR HUMAN 100 UNIT/ML IJ SOLN: 12.0000 [IU] | Freq: Three times a day (TID) | INTRAMUSCULAR | 11 refills | Status: DC

## 2018-11-05 NOTE — Assessment & Plan Note (Signed)
Rectal thickening by CT - rec GI eval. Will place referral and see if our care coordinator can find financial assistance program for patient.

## 2018-11-05 NOTE — Assessment & Plan Note (Deleted)
Constipation leading to lower back pain and abdominal cramping. Bentyl has helped cramping, discussed caution as it can worsen constipation. CT findings with circumferential rectal thickening - recommended colonoscopy however pt has trouble affording. Will touch base with our care coordinator to look into pt assistance for colonoscopy.

## 2018-11-05 NOTE — Assessment & Plan Note (Signed)
New diagnosis by CT scan at ER. Reviewed with patient. Anticipate NAFLD related. Evidence of portal hypertension on CT as well. Consider GI referral but pt struggles with affording medical care.

## 2018-11-05 NOTE — Assessment & Plan Note (Addendum)
Chronically uncontrolled. Improved readings noted however she remains concerned about some elevated cbg's, asks about SSI. Already on lantus 60 U BID. Will reassess at next month's visit.

## 2018-11-05 NOTE — Assessment & Plan Note (Signed)
Constipation leading to lower back pain and abdominal cramping. Bentyl has helped cramping, discussed caution as it can worsen constipation. CT findings with circumferential rectal thickening - recommended colonoscopy however pt has trouble affording. Will touch base with our care coordinator to look into pt assistance for colonoscopy.

## 2018-11-05 NOTE — Progress Notes (Signed)
Virtual visit completed through [employer]. Due to national recommendations of social distancing due to COVID-19, a virtual visit is felt to be most appropriate for this patient at this time. Reviewed limitations of a virtual visit.   Patient location: home Provider location: [place] [facility], office If any vitals were documented, they were collected by patient at home unless specified below.    There were no vitals taken for this visit.   CC: ER f/u visit Subjective:    Patient ID: Karen [name], female    DOB: 1955-12-08, 63 y.o.   MRN: [id number]  HPI: Karen [name] is a 63 y.o. female presenting on 11/05/2018 for ER Follow-up   Recently seen at [facility] then at ER for lower back pain and abdominal cramping, records reviewed. S/p KUB and CT, thought largely due to constipation. Treated with bentyl PRN cramping - requests refill of this. Reviewed findings on latest imaging including CT abd/pelvis.   Has been having more regular stools since she's more regular with her bowel regimen. Continues miralax BID and docusate BID.  Denies blood in stool.   Has appt tomorrow for mammogram.   No insurance - working with lawyer towards LTD.   DM - continues lantus 60u BID, novolin R 12 u with meals, as well as metformin and glipizide 5mg  bid. Denies low sugar readings. Checks cbgs TID and more if needed. Advised to bring in sugar log to next month's visit to review readings then titrate insulin accordingly. States unable to afford endo eval.  Lab Results  Component Value Date   HGBA1C 12.3 (H) 09/10/2018        Relevant past medical, surgical, family and social history reviewed and updated as indicated. Interim medical history since our last visit reviewed. Allergies and medications reviewed and updated. Outpatient Medications Prior to Visit  Medication Sig Dispense Refill   acetaminophen (TYLENOL) 500 MG tablet Take 1,000 mg by mouth 3 (three) times daily as needed for mild pain.      aspirin EC 81 MG EC tablet Take 1 tablet (81 mg total) by mouth daily.     docusate sodium (COLACE) 100 MG capsule Take 100 mg by mouth 2 (two) times daily.     furosemide (LASIX) 20 MG tablet Take 1 tablet (20 mg total) by mouth daily as needed for edema. 30 tablet 3   gabapentin (NEURONTIN) 300 MG capsule Take 1 capsule (300 mg total) by mouth at bedtime. 90 capsule 3   glipiZIDE (GLUCOTROL) 5 MG tablet Take 0.5-1 tablets (2.5-5 mg total) by mouth 2 (two) times daily. Depending on meal size 180 tablet 0   Insulin Glargine (LANTUS SOLOSTAR) 100 UNIT/ML Solostar Pen Inject 60 Units into the skin 2 (two) times daily. QS 1 mo 12 pen 11   Insulin Pen Needle (PEN NEEDLES) 31G X 8 MM MISC Use to inject Lantus as directed twice daily. Dx: E11.40 60 each 3   KLOR-CON SPRINKLE 10 MEQ CR capsule Take 1 capsule (10 mEq total) by mouth daily as needed. When taking Lasix 30 capsule 3   losartan (COZAAR) 25 MG tablet Take 1 tablet (25 mg total) by mouth daily. 90 tablet 3   nitroGLYCERIN (NITROSTAT) 0.4 MG SL tablet Place 1 tablet (0.4 mg total) under the tongue every 5 (five) minutes as needed for chest pain. 25 tablet 0   omeprazole (PRILOSEC) 40 MG capsule Take 1 capsule (40 mg total) by mouth daily. 30 capsule 11   polyethylene glycol (MIRALAX /  GLYCOLAX) 17 g packet Take 17 g by mouth daily as needed.     rosuvastatin (CRESTOR) 5 MG tablet Take 1 tablet (5 mg total) by mouth daily. 30 tablet 6   sertraline (ZOLOFT) 50 MG tablet Take 1 tablet (50 mg total) by mouth daily. 30 tablet 11   dicyclomine (BENTYL) 20 MG tablet Take 1 tablet (20 mg total) by mouth daily. 6 tablet 0   insulin regular (NOVOLIN R) 100 units/mL injection Inject 0.1 mLs (10 Units total) into the skin 3 (three) times daily before meals. (Patient taking differently: Inject 12 Units into the skin 3 (three) times daily before meals. ) 10 mL 11   metFORMIN (GLUCOPHAGE XR) 750 MG 24 hr tablet Take 2 tablets (1,500 mg total) by  mouth daily with breakfast. (Patient taking differently: Take 1,500 mg by mouth every evening. ) 60 tablet 3   acetaminophen (TYLENOL 8 HOUR) 650 MG CR tablet Take 2 tablets (1,300 mg total) by mouth 2 (two) times daily as needed for pain. (Patient not taking: Reported on 11/02/2018)     metFORMIN (GLUCOPHAGE) 1000 MG tablet Take 1 tablet (1,000 mg total) by mouth 2 (two) times daily with a meal. (Patient not taking: Reported on 11/02/2018) 180 tablet 0   No facility-administered medications prior to visit.      Per HPI unless specifically indicated in ROS section below Review of Systems Objective:    There were no vitals taken for this visit.  Wt Readings from Last 3 Encounters:  11/02/18 181 lb 3.2 oz (82.2 kg)  05/31/18 184 lb 12 oz (83.8 kg)  01/10/18 182 lb (82.6 kg)     Physical exam: Gen: alert, NAD, not ill appearing Pulm: speaks in complete sentences without increased work of breathing Psych: normal mood, normal thought content      Results for orders placed or performed during the hospital encounter of 11/02/18  Lipase, blood  Result Value Ref Range   Lipase 41 11 - 51 U/L  Comprehensive metabolic panel  Result Value Ref Range   Sodium 135 135 - 145 mmol/L   Potassium 4.6 3.5 - 5.1 mmol/L   Chloride 101 98 - 111 mmol/L   CO2 23 22 - 32 mmol/L   Glucose, Bld 357 (H) 70 - 99 mg/dL   BUN 16 8 - 23 mg/dL   Creatinine, Ser 0.68 0.44 - 1.00 mg/dL   Calcium 9.8 8.9 - 10.3 mg/dL   Total Protein 7.2 6.5 - 8.1 g/dL   Albumin 4.1 3.5 - 5.0 g/dL   AST 38 15 - 41 U/L   ALT 44 0 - 44 U/L   Alkaline Phosphatase 96 38 - 126 U/L   Total Bilirubin 0.8 0.3 - 1.2 mg/dL   GFR calc non Af Amer >60 >60 mL/min   GFR calc Af Amer >60 >60 mL/min   Anion gap 11 5 - 15  CBC  Result Value Ref Range   WBC 4.7 4.0 - 10.5 K/uL   RBC 4.96 3.87 - 5.11 MIL/uL   Hemoglobin 14.1 12.0 - 15.0 g/dL   HCT 43.4 36.0 - 46.0 %   MCV 87.5 80.0 - 100.0 fL   MCH 28.4 26.0 - 34.0 pg   MCHC 32.5 30.0  - 36.0 g/dL   RDW 13.0 11.5 - 15.5 %   Platelets 126 (L) 150 - 400 K/uL   nRBC 0.0 0.0 - 0.2 %  Urinalysis, Routine w reflex microscopic  Result Value Ref Range   Color, Urine  YELLOW YELLOW   APPearance CLEAR CLEAR   Specific Gravity, Urine 1.028 1.005 - 1.030   pH 5.0 5.0 - 8.0   Glucose, UA >=500 (A) NEGATIVE mg/dL   Hgb urine dipstick NEGATIVE NEGATIVE   Bilirubin Urine NEGATIVE NEGATIVE   Ketones, ur NEGATIVE NEGATIVE mg/dL   Protein, ur 30 (A) NEGATIVE mg/dL   Nitrite NEGATIVE NEGATIVE   Leukocytes,Ua NEGATIVE NEGATIVE   RBC / HPF 0-5 0 - 5 RBC/hpf   WBC, UA 0-5 0 - 5 WBC/hpf   Bacteria, UA NONE SEEN NONE SEEN   Squamous Epithelial / LPF 0-5 0 - 5    CT ABD PELVIS WITH CONTRAST IMPRESSION: 1. Nodular contour of the liver, consistent with cirrhosis. 2. Recanalization of the umbilical vein, which may be seen with portal hypertension. 3. Circumferential thickening of the rectum, significance uncertain. Correlation with colonoscopy on a nonemergent basis may be considered. 4. Normal appearance of the lumbosacral spine. Electronically Signed   By: Fidela Salisbury M.D.   On: 11/02/2018 22:50 Assessment & Plan:   Problem List Items Addressed This Visit    Type 2 diabetes, uncontrolled, with neuropathy ([place])    Chronically uncontrolled. Improved readings noted however she remains concerned about some elevated cbg's, asks about SSI. Already on lantus 60 U BID. Will reassess at next month's visit.       Relevant Medications   insulin regular (NOVOLIN R) 100 units/mL injection   metFORMIN (GLUCOPHAGE XR) 750 MG 24 hr tablet   Rectal abnormality    Rectal thickening by CT - rec GI eval. Will place referral and see if our care coordinator can find financial assistance program for patient.      Relevant Orders   Ambulatory referral to Gastroenterology   Constipation - Primary    Constipation leading to lower back pain and abdominal cramping. Bentyl has helped cramping,  discussed caution as it can worsen constipation. CT findings with circumferential rectal thickening - recommended colonoscopy however pt has trouble affording. Will touch base with our care coordinator to look into pt assistance for colonoscopy.       Relevant Orders   Ambulatory referral to Gastroenterology   Cirrhosis of liver [facility])    New diagnosis by CT scan at ER. Reviewed with patient. Anticipate NAFLD related. Evidence of portal hypertension on CT as well. Consider GI referral but pt struggles with affording medical care.       Relevant Orders   Ambulatory referral to Gastroenterology       Meds ordered this encounter  Medications   dicyclomine (BENTYL) 20 MG tablet    Sig: Take 1 tablet (20 mg total) by mouth 2 (two) times daily as needed for spasms.    Dispense:  45 tablet    Refill:  3   insulin regular (NOVOLIN R) 100 units/mL injection    Sig: Inject 0.12 mLs (12 Units total) into the skin 3 (three) times daily before meals.    Dispense:  10 mL    Refill:  11   metFORMIN (GLUCOPHAGE XR) 750 MG 24 hr tablet    Sig: Take 2 tablets (1,500 mg total) by mouth every evening.    Dispense:  60 tablet    Refill:  6   Orders Placed This Encounter  Procedures   Ambulatory referral to Gastroenterology    Referral Priority:   Routine    Referral Type:   Consultation    Referral Reason:   Specialty Services Required    Number of Visits  Requested:   1    I discussed the assessment and treatment plan with the patient. The patient was provided an opportunity to ask questions and all were answered. The patient agreed with the plan and demonstrated an understanding of the instructions. The patient was advised to call back or seek an in-person evaluation if the symptoms worsen or if the condition fails to improve as anticipated.  Follow up plan: No follow-ups on file.  [name], MD

## 2018-11-06 ENCOUNTER — Other Ambulatory Visit: Payer: Self-pay

## 2018-11-06 ENCOUNTER — Ambulatory Visit (HOSPITAL_COMMUNITY): Payer: Self-pay

## 2018-11-06 NOTE — Telephone Encounter (Signed)
Seen today. 

## 2018-11-06 NOTE — Telephone Encounter (Signed)
Tramadol not on med list, last filled on prev med list was on 08/09/18 #30 tabs with 0 refills. Last OV ER f/u yesterday and next OV on 12/07/18

## 2018-11-08 NOTE — Telephone Encounter (Signed)
Eprescribed.

## 2018-11-09 ENCOUNTER — Encounter: Payer: Self-pay | Admitting: Gastroenterology

## 2018-11-15 ENCOUNTER — Encounter: Payer: Self-pay | Admitting: Family Medicine

## 2018-11-15 MED ORDER — LANTUS SOLOSTAR 100 UNIT/ML ~~LOC~~ SOPN: 60.0000 [IU] | PEN_INJECTOR | Freq: Two times a day (BID) | SUBCUTANEOUS | 3 refills | Status: DC

## 2018-11-15 NOTE — Telephone Encounter (Signed)
Pt said she gets her meds free, this does not get filled at [facility].

## 2018-11-22 ENCOUNTER — Encounter: Payer: Self-pay | Admitting: Family Medicine

## 2018-11-26 ENCOUNTER — Encounter: Payer: Self-pay | Admitting: Family Medicine

## 2018-11-27 ENCOUNTER — Encounter: Payer: Self-pay | Admitting: Family Medicine

## 2018-11-27 NOTE — Telephone Encounter (Signed)
Notified pt, via MyChart, her Lantus is ready to pick up.  [Lantus shipment (total of 7 boxes) is in 'Flu vaccine' refrigerator on 2nd shelf of the door.  Use blue plastic shopping bag (included) package Lantus for pt.]

## 2018-11-27 NOTE — Telephone Encounter (Signed)
Spoke with [employer] asking for status of pt's app.  Says pt has been approved, Lantus has been shipped and scheduled to be delivered today.  Notified pt via [place].

## 2018-11-27 NOTE — Telephone Encounter (Signed)
Does patient need virtual or in office appointment?

## 2018-11-28 ENCOUNTER — Ambulatory Visit: Payer: Self-pay | Admitting: Gastroenterology

## 2018-11-29 NOTE — Telephone Encounter (Signed)
plz schedule in office visit for patient.

## 2018-11-30 NOTE — Telephone Encounter (Signed)
Pt is already scheduled for 12/07/18 but I did leave a message for pt to call back if she wanted to get in sooner.

## 2018-12-04 ENCOUNTER — Ambulatory Visit: Payer: Self-pay | Admitting: Family Medicine

## 2018-12-04 ENCOUNTER — Telehealth: Payer: Self-pay

## 2018-12-04 NOTE — Telephone Encounter (Signed)
LM for pt to call back:  Covid-19 screening questions   Do you now or have you had a fever in the last 14 days?  Do you have any respiratory symptoms of shortness of breath or cough now or in the last 14 days?  Do you have any family members or close contacts with diagnosed or suspected Covid-19 in the past 14 days?  Have you been tested for Covid-19 and found to be positive?

## 2018-12-05 ENCOUNTER — Ambulatory Visit: Payer: Self-pay | Admitting: Gastroenterology

## 2018-12-05 NOTE — Telephone Encounter (Signed)
Pt cancelled her appt for 7-22.

## 2018-12-06 ENCOUNTER — Ambulatory Visit: Payer: Self-pay | Admitting: Family Medicine

## 2018-12-07 ENCOUNTER — Ambulatory Visit (INDEPENDENT_AMBULATORY_CARE_PROVIDER_SITE_OTHER): Payer: Self-pay | Admitting: Family Medicine

## 2018-12-07 ENCOUNTER — Ambulatory Visit: Payer: Self-pay | Admitting: Family Medicine

## 2018-12-07 ENCOUNTER — Other Ambulatory Visit: Payer: Self-pay

## 2018-12-07 ENCOUNTER — Encounter: Payer: Self-pay | Admitting: Family Medicine

## 2018-12-07 VITALS — BP 100/52 | HR 82 | Temp 98.4°F | Ht 61.0 in | Wt 180.2 lb

## 2018-12-07 DIAGNOSIS — I2510 Atherosclerotic heart disease of native coronary artery without angina pectoris: Secondary | ICD-10-CM

## 2018-12-07 DIAGNOSIS — E785 Hyperlipidemia, unspecified: Secondary | ICD-10-CM

## 2018-12-07 DIAGNOSIS — I10 Essential (primary) hypertension: Secondary | ICD-10-CM

## 2018-12-07 DIAGNOSIS — IMO0002 Reserved for concepts with insufficient information to code with codable children: Secondary | ICD-10-CM

## 2018-12-07 DIAGNOSIS — E1140 Type 2 diabetes mellitus with diabetic neuropathy, unspecified: Secondary | ICD-10-CM

## 2018-12-07 DIAGNOSIS — E1165 Type 2 diabetes mellitus with hyperglycemia: Secondary | ICD-10-CM

## 2018-12-07 DIAGNOSIS — E113399 Type 2 diabetes mellitus with moderate nonproliferative diabetic retinopathy without macular edema, unspecified eye: Secondary | ICD-10-CM

## 2018-12-07 DIAGNOSIS — Z9861 Coronary angioplasty status: Secondary | ICD-10-CM

## 2018-12-07 NOTE — Assessment & Plan Note (Addendum)
Continue crestor. Will need updated FLP when returns fasting.  The 10-year ASCVD risk score Mikey Bussing DC Brooke Bonito., et al., 2013) is: 8.9%   Values used to calculate the score:     Age: 63 years     Sex: Female     Is Non-Hispanic African American: No     Diabetic: Yes     Tobacco smoker: No     Systolic Blood Pressure: 342 mmHg     Is BP treated: Yes     HDL Cholesterol: 27.7 mg/dL     Total Cholesterol: 205 mg/dL

## 2018-12-07 NOTE — Assessment & Plan Note (Addendum)
Continue aspirin, statin. Unable to afford DAPT.

## 2018-12-07 NOTE — Progress Notes (Signed)
This visit was conducted in person.  BP (!) 100/52    Pulse 82    Temp 98.4 F (36.9 C)    Ht 5\' 1"  (1.549 m)    Wt 180 lb 4 oz (81.8 kg)    SpO2 96%    BMI 34.06 kg/m    CC: f/u visit Subjective:    Patient ID: [name], female    DOB: 1955/11/01, 63 y.o.   MRN: [id number]  HPI: MAZI SCHUFF is a 63 y.o. female presenting on 12/07/2018 for Follow-up (3 months follow up)   DM - brings cbg readings - premeal 89-300s, postmeal 200-300s On lantus 60u bid and novolin R TID AC SSI (8u cbg >130 + 4 units for every 50 > 180) and metformin XR 1500mg  in evenings.  Preventative: Colon cancer screening - never returned iFOB. Upcoming GI appt 12/2018 with [facility]. CT at ER showed circumferential rectal thickening.  Well woman exam - last pap 05/2013 WNL. H/o abnormal paps. States has upcoming appt with breast and cervical cancer screening program for CBE and pap upcoming 01/01/2019.  Mammogram - scheduled 01/03/2019 Flu shot - yearly Pneumovax - 2015 Tdap - 2017 Seat belt use discussed Sunscreen use discussed. No changing moles on skin.   Caffeine: 6 diet sodas/day Lives with 2 dogs. 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 Interim medical history since our last visit reviewed. Allergies and medications reviewed and updated. Outpatient Medications Prior to Visit  Medication Sig Dispense Refill   acetaminophen (TYLENOL) 500 MG tablet Take 1,000 mg by mouth 3 (three) times daily as needed for mild pain.     aspirin EC 81 MG EC tablet Take 1 tablet (81 mg total) by mouth daily.     dicyclomine (BENTYL) 20 MG tablet Take 1 tablet (20 mg total) by mouth 2 (two) times daily as needed for spasms. 45 tablet 3   docusate sodium (COLACE) 100 MG capsule Take 100 mg by mouth 2 (two) times daily.     furosemide (LASIX) 20 MG  tablet Take 1 tablet (20 mg total) by mouth daily as needed for edema. 30 tablet 3   gabapentin (NEURONTIN) 300 MG capsule Take 1 capsule (300 mg total) by mouth at bedtime. 90 capsule 3   Insulin Glargine (LANTUS SOLOSTAR) 100 UNIT/ML Solostar Pen Inject 60 Units into the skin 2 (two) times daily. QS 1 mo 15 pen 3   Insulin Pen Needle (PEN NEEDLES) 31G X 8 MM MISC Use to inject Lantus as directed twice daily. Dx: E11.40 60 each 3   insulin regular (NOVOLIN R) 100 units/mL injection Inject 0.15 mLs (15 Units total) into the skin 3 (three) times daily before meals. + sliding scale insulin 5u for every 50 over 250 10 mL    KLOR-CON SPRINKLE 10 MEQ CR capsule Take 1 capsule (10 mEq total) by mouth daily as needed. When taking Lasix 30 capsule 3   losartan (COZAAR) 25 MG tablet Take 1 tablet (25 mg total) by mouth daily. 90 tablet 3   metFORMIN (GLUCOPHAGE XR) 750 MG 24 hr tablet Take 2 tablets (1,500 mg total) by mouth every evening. 60 tablet 6   nitroGLYCERIN (NITROSTAT) 0.4 MG SL tablet Place 1 tablet (0.4 mg total) under the tongue every 5 (five) minutes as needed for chest pain. 25 tablet 0  omeprazole (PRILOSEC) 40 MG capsule Take 1 capsule (40 mg total) by mouth daily. 30 capsule 11   polyethylene glycol (MIRALAX / GLYCOLAX) 17 g packet Take 17 g by mouth daily as needed.     rosuvastatin (CRESTOR) 5 MG tablet Take 1 tablet (5 mg total) by mouth daily. 30 tablet 6   sertraline (ZOLOFT) 50 MG tablet Take 1 tablet (50 mg total) by mouth daily. 30 tablet 11   traMADol (ULTRAM) 50 MG tablet Take 1 tablet (50 mg total) by mouth daily as needed for moderate pain. 30 tablet 0   glipiZIDE (GLUCOTROL) 5 MG tablet Take 0.5-1 tablets (2.5-5 mg total) by mouth 2 (two) times daily. Depending on meal size 180 tablet 0   insulin regular (NOVOLIN R) 100 units/mL injection Inject 0.12 mLs (12 Units total) into the skin 3 (three) times daily before meals. 10 mL 11   insulin regular (NOVOLIN R) 100  units/mL injection Inject 0.15 mLs (15 Units total) into the skin 3 (three) times daily before meals. + sliding scale insulin     No facility-administered medications prior to visit.      Per HPI unless specifically indicated in ROS section below Review of Systems Objective:    BP (!) 100/52    Pulse 82    Temp 98.4 F (36.9 C)    Ht 5\' 1"  (1.549 m)    Wt 180 lb 4 oz (81.8 kg)    SpO2 96%    BMI 34.06 kg/m   Wt Readings from Last 3 Encounters:  12/07/18 180 lb 4 oz (81.8 kg)  11/02/18 181 lb 3.2 oz (82.2 kg)  05/31/18 184 lb 12 oz (83.8 kg)    Physical Exam Vitals signs and nursing note reviewed.  Constitutional:      General: She is not in acute distress.    Appearance: She is well-developed.  HENT:     Head: Normocephalic and atraumatic.     Right Ear: External ear normal.     Left Ear: External ear normal.     Nose: Nose normal.     Mouth/Throat:     Pharynx: No oropharyngeal exudate.  Eyes:     General: No scleral icterus.    Conjunctiva/sclera: Conjunctivae normal.     Pupils: Pupils are equal, round, and reactive to light.  Neck:     Musculoskeletal: Normal range of motion and neck supple.  Cardiovascular:     Rate and Rhythm: Normal rate and regular rhythm.     Heart sounds: Normal heart sounds. No murmur.  Pulmonary:     Effort: Pulmonary effort is normal. No respiratory distress.     Breath sounds: Normal breath sounds. No wheezing or rales.  Musculoskeletal:     Comments: See HPI for foot exam if done  Lymphadenopathy:     Cervical: No cervical adenopathy.  Skin:    General: Skin is warm and dry.     Findings: No rash.       Results for orders placed or performed during the hospital encounter of 11/02/18  Lipase, blood  Result Value Ref Range   Lipase 41 11 - 51 U/L  Comprehensive metabolic panel  Result Value Ref Range   Sodium 135 135 - 145 mmol/L   Potassium 4.6 3.5 - 5.1 mmol/L   Chloride 101 98 - 111 mmol/L   CO2 23 22 - 32 mmol/L   Glucose,  Bld 357 (H) 70 - 99 mg/dL   BUN 16 8 - 23 mg/dL  Creatinine, Ser 0.68 0.44 - 1.00 mg/dL   Calcium 9.8 8.9 - 10.3 mg/dL   Total Protein 7.2 6.5 - 8.1 g/dL   Albumin 4.1 3.5 - 5.0 g/dL   AST 38 15 - 41 U/L   ALT 44 0 - 44 U/L   Alkaline Phosphatase 96 38 - 126 U/L   Total Bilirubin 0.8 0.3 - 1.2 mg/dL   GFR calc non Af Amer >60 >60 mL/min   GFR calc Af Amer >60 >60 mL/min   Anion gap 11 5 - 15  CBC  Result Value Ref Range   WBC 4.7 4.0 - 10.5 K/uL   RBC 4.96 3.87 - 5.11 MIL/uL   Hemoglobin 14.1 12.0 - 15.0 g/dL   HCT 43.4 36.0 - 46.0 %   MCV 87.5 80.0 - 100.0 fL   MCH 28.4 26.0 - 34.0 pg   MCHC 32.5 30.0 - 36.0 g/dL   RDW 13.0 11.5 - 15.5 %   Platelets 126 (L) 150 - 400 K/uL   nRBC 0.0 0.0 - 0.2 %  Urinalysis, Routine w reflex microscopic  Result Value Ref Range   Color, Urine YELLOW YELLOW   APPearance CLEAR CLEAR   Specific Gravity, Urine 1.028 1.005 - 1.030   pH 5.0 5.0 - 8.0   Glucose, UA >=500 (A) NEGATIVE mg/dL   Hgb urine dipstick NEGATIVE NEGATIVE   Bilirubin Urine NEGATIVE NEGATIVE   Ketones, ur NEGATIVE NEGATIVE mg/dL   Protein, ur 30 (A) NEGATIVE mg/dL   Nitrite NEGATIVE NEGATIVE   Leukocytes,Ua NEGATIVE NEGATIVE   RBC / HPF 0-5 0 - 5 RBC/hpf   WBC, UA 0-5 0 - 5 WBC/hpf   Bacteria, UA NONE SEEN NONE SEEN   Squamous Epithelial / LPF 0-5 0 - 5   Assessment & Plan:   Problem List Items Addressed This Visit    Type 2 diabetes, uncontrolled, with neuropathy ([place]) - Primary    Chronically uncontrolled. Anticipate progression of diabetes with burnt out pancreas and poor effect of glipizide - will stop this. Inconsistent mealtime - reviewed importance of scheduled mealtimes as much as able - she will work towards this. Will continue titration of novolin R to 15u with meals, 20u if cbg 251-300, 25u if cbg 301-350, 30u if cbg higher. Advised pt contact us with effect in 1-2 wks - cbg log provided to keep track. She will contact us sooner if persistently high or low  sugars develop.       Relevant Medications   insulin regular (NOVOLIN R) 100 units/mL injection   HTN (hypertension)    Only on low dose losartan - continue.       Dyslipidemia    Continue crestor. Will need updated FLP when returns fasting.  The 10-year ASCVD risk score Mikey Bussing DC Brooke Bonito., et al., 2013) is: 8.9%   Values used to calculate the score:     Age: 62 years     Sex: Female     Is Non-Hispanic African American: No     Diabetic: Yes     Tobacco smoker: No     Systolic Blood Pressure: 161 mmHg     Is BP treated: Yes     HDL Cholesterol: 27.7 mg/dL     Total Cholesterol: 205 mg/dL       Diabetic retinopathy ([facility])   Relevant Medications   insulin regular (NOVOLIN R) 100 units/mL injection   CAD S/P percutaneous coronary angioplasty    Continue aspirin, statin. Unable to afford DAPT.  No orders of the defined types were placed in this encounter.  No orders of the defined types were placed in this encounter.   Patient Instructions  New sliding scale would be 15 units with meals + 5 units for every 50 above 200.  novolin R 15 units with meals if sugar <250, 20 units if 250-300, 25 units if 300-350, 30 units >350. Call us if consistently >350 or any <70.  Give me an update in 1-2 weeks over mychart.   Stop glipizide for now. Look into trulicity or ozempic patient assistance programs.  Return in 3 months for diabetes follow up.    Follow up plan: Return in about 3 months (around 03/09/2019) for follow up visit.  [name], MD

## 2018-12-07 NOTE — Assessment & Plan Note (Signed)
Only on low dose losartan - continue.

## 2018-12-07 NOTE — Assessment & Plan Note (Addendum)
Chronically uncontrolled. Anticipate progression of diabetes with burnt out pancreas and poor effect of glipizide - will stop this. Inconsistent mealtime - reviewed importance of scheduled mealtimes as much as able - she will work towards this. Will continue titration of novolin R to 15u with meals, 20u if cbg 251-300, 25u if cbg 301-350, 30u if cbg higher. Advised pt contact us with effect in 1-2 wks - cbg log provided to keep track. She will contact us sooner if persistently high or low sugars develop.

## 2018-12-07 NOTE — Patient Instructions (Addendum)
New sliding scale would be 15 units with meals + 5 units for every 50 above 200.  novolin R 15 units with meals if sugar <250, 20 units if 250-300, 25 units if 300-350, 30 units >350. Call us if consistently >350 or any <70.  Give me an update in 1-2 weeks over mychart.   Stop glipizide for now. Look into trulicity or ozempic patient assistance programs.  Return in 3 months for diabetes follow up.

## 2018-12-19 ENCOUNTER — Telehealth: Payer: Self-pay

## 2018-12-19 NOTE — Telephone Encounter (Signed)
Copied from [place] [id number]. Topic: Referral - Status >> Dec 19, 2018  9:70 PM Simone Curia D wrote: 06/22/3783 Left message on voicemail regarding community resources for [facility] and Medicaid. Ambrose Mantle [phone]

## 2018-12-20 ENCOUNTER — Telehealth: Payer: Self-pay

## 2018-12-20 ENCOUNTER — Other Ambulatory Visit: Payer: Self-pay | Admitting: Family Medicine

## 2018-12-20 NOTE — Telephone Encounter (Signed)
Copied from [place] [id number]. Topic: Referral - Status >> Dec 20, 2018 10:27 AM Karen [name] D wrote: 06/20/3662 Spoke with patient about Obamacare and she is not eligible at this time. She is currently in the process of filing for disability and has an Forensic psychologist.  She is looking into getting insurance through [employer].  Once she is approved or denied for disability she will have a better idea what she will qualify for.  Patient stated she is good for now and has no other needs but will call if she does. [facility]  financial assistance form to [email or web address] per patients request. Ambrose Mantle [phone]

## 2018-12-21 ENCOUNTER — Telehealth: Payer: Self-pay

## 2018-12-21 ENCOUNTER — Encounter: Payer: Self-pay | Admitting: Family Medicine

## 2018-12-21 NOTE — Telephone Encounter (Signed)
Is she taking lantus 60u bid?  Continue novolin R 15u TID with meals but  New sliding scale will be +5 units for every 50 over 200 So: 200-250 +5 (20u total)  251-300 +10 (25u total) 301-350 +15 (30u total) 351-400 + 20 (35u total) >400 + 25u (40u total) and call us.   She won't see improvement in sugars until she is more regular with mealtimes and insulin dosing schedule.   Please drop off sugar log to review.

## 2018-12-21 NOTE — Telephone Encounter (Addendum)
Left message on vm for pt to call back.  I also sent Dr. Synthia Innocent message in a MyChart message, in case pt does not return my call before end of day.  Also, pt uses MyChart more to communicate with [place].

## 2018-12-21 NOTE — Telephone Encounter (Signed)
On 12/20/18 did not take FBS. On 12/21/18 2 PM BS is 301; 12 midnight BS was 229 pt ate a lot of popcorn while watching a movie; at 2:39 AM BS was 304. On 12/21/18 at 7:30 AM FBS was 398 and pt took Novolin R 30 units. At 9 AM FBS 349 pt still had not eaten. No diabetic symptoms noted. Since 12/15/18 BS from 211 - 398. Pt will drink plenty of water and will wait for cb for further instruction. Pt said she is trying to eat a better diet and on a more structured meal time but not there yet.Please advise.

## 2018-12-21 NOTE — Telephone Encounter (Signed)
Gabapentin Last filled:  03/29/18, #90 Last OV:  12/07/18, f/u Next OV:  03/11/19, 3 mo f/u

## 2018-12-27 NOTE — Telephone Encounter (Signed)
noted 

## 2019-01-01 ENCOUNTER — Ambulatory Visit (HOSPITAL_COMMUNITY): Payer: Self-pay

## 2019-01-02 ENCOUNTER — Telehealth: Payer: Self-pay

## 2019-01-02 ENCOUNTER — Ambulatory Visit: Payer: Self-pay | Admitting: Gastroenterology

## 2019-01-02 NOTE — Telephone Encounter (Signed)
Copied from [place] [id number]. Topic: Referral - Status >> Jan 02, 2019  1:47 AM Simone Curia D wrote: 01/12/5620 Left message on voicemail for patient to return my call regarding [employer] forms emailed to her. Also checking to see if she was able to get coverage from [place]. Ambrose Mantle [phone]

## 2019-01-03 ENCOUNTER — Telehealth: Payer: Self-pay

## 2019-01-03 ENCOUNTER — Other Ambulatory Visit: Payer: Self-pay

## 2019-01-03 NOTE — Telephone Encounter (Signed)
Copied from [place] [phone]. Topic: Referral - Status >> Jan 03, 2019 00:37 PM Simone Curia D wrote: 0/48/8891 Left message on voicemail for patient to return my call regarding [employer] forms emailed to her. Also checking to see if she was able to get coverage from [place]. Ambrose Mantle [phone]

## 2019-01-04 ENCOUNTER — Telehealth: Payer: Self-pay

## 2019-01-04 NOTE — Telephone Encounter (Signed)
01/04/2019 Left message on voicemail for patient to return my call regarding [employer] forms emailed to her. Also checking to see if she was able to get coverage from [place]. Ambrose Mantle [phone]

## 2019-01-15 ENCOUNTER — Ambulatory Visit: Payer: Self-pay | Admitting: Gastroenterology

## 2019-01-23 ENCOUNTER — Encounter: Payer: Self-pay | Admitting: *Deleted

## 2019-01-25 ENCOUNTER — Ambulatory Visit: Payer: Self-pay | Admitting: Gastroenterology

## 2019-01-27 ENCOUNTER — Encounter: Payer: Self-pay | Admitting: Family Medicine

## 2019-02-07 ENCOUNTER — Ambulatory Visit
Admission: RE | Admit: 2019-02-07 | Discharge: 2019-02-07 | Disposition: A | Discharge status: Home / Self Care | Payer: No Typology Code available for payment source | Source: Ambulatory Visit | Attending: Obstetrics and Gynecology | Admitting: Obstetrics and Gynecology

## 2019-02-07 ENCOUNTER — Ambulatory Visit: Payer: Self-pay

## 2019-02-07 ENCOUNTER — Ambulatory Visit (HOSPITAL_COMMUNITY)
Admission: RE | Admit: 2019-02-07 | Discharge: 2019-02-07 | Disposition: A | Discharge status: Home / Self Care | Payer: Self-pay | Source: Ambulatory Visit | Attending: Obstetrics and Gynecology | Admitting: Obstetrics and Gynecology

## 2019-02-07 ENCOUNTER — Encounter (HOSPITAL_COMMUNITY): Payer: Self-pay

## 2019-02-07 ENCOUNTER — Other Ambulatory Visit: Payer: Self-pay

## 2019-02-07 DIAGNOSIS — N644 Mastodynia: Secondary | ICD-10-CM | POA: Insufficient documentation

## 2019-02-07 DIAGNOSIS — Z01419 Encounter for gynecological examination (general) (routine) without abnormal findings: Secondary | ICD-10-CM | POA: Insufficient documentation

## 2019-02-07 NOTE — Patient Instructions (Signed)
Explained breast self awareness with [name]. Patient did not need a Pap smear today due to last Pap smear and HPV typing was 06/10/2013. Let her know [facility] will cover Pap smears and HPV typing every 5 years unless has a history of abnormal Pap smears. Referred patient to the [facility] for a diagnostic mammogram. Appointment scheduled for Thursday, February 07, 2019 at 1450. Patient aware of appointment and will be there. [name] verbalized understanding.  Meigan Pates, Arvil Chaco, RN 1:00 PM

## 2019-02-07 NOTE — Progress Notes (Signed)
Complaints of left outer breast pain since April 2020. Patient stated the pain comes and goes. Patient rates the pain at a 5-6 out of 10.  Pap Smear: Pap smear completed today. Last Pap smear was 06/10/2013 and normal with negative HPV. Per patient has a history of an abnormal Pap smear in the 1990's that cryotherapy was completed for follow-up. Per patient all Pap smears have been normal since. Last Pap smear result is in Epic.  Physical exam: Breasts Breasts symmetrical. No skin abnormalities bilateral breasts. No nipple retraction bilateral breasts. No nipple discharge bilateral breasts. No lymphadenopathy. No lumps palpated bilateral breasts. No complaints of pain or tenderness on exam. Referred patient to the [facility] for a diagnostic mammogram. Appointment scheduled for Thursday, February 07, 2019 at 1450.        Pelvic/Bimanual   Ext Genitalia No lesions, no swelling and no discharge observed on external genitalia.         Vagina Vagina pink and normal texture. No lesions or discharge observed in vagina.          Cervix Cervix is present. Cervix pink and of normal texture. No discharge observed.     Uterus Uterus is present and palpable. Uterus in normal position and normal size.        Adnexae Bilateral ovaries present and palpable. No tenderness on palpation.         Rectovaginal No rectal exam completed today since patient had no rectal complaints. No skin abnormalities observed on exam.    Smoking History: Patient has never smoked.  Patient Navigation: Patient education provided. Access to services provided for patient through [facility] program.   Colorectal Cancer Screening: Per patient has never had a colonoscopy completed. No complaints today.   Breast and Cervical Cancer Risk Assessment: Patient has a family history of a paternal aunt having breast cancer. Patient has no known genetic mutations or history of radiation treatment to the chest before  age 30. Per patient has a history of cervical dysplasia. Patient has no history of being immunocompromised or DES exposure in-utero.  Risk Assessment    Risk Scores      02/07/2019   Last edited by: Armond Hang, LPN   5-year risk: 1.6 %   Lifetime risk: 7.9 %

## 2019-02-11 LAB — CYTOLOGY - PAP
Diagnosis: NEGATIVE
High risk HPV: NEGATIVE
Molecular Disclaimer: 56
Molecular Disclaimer: NORMAL

## 2019-02-12 ENCOUNTER — Ambulatory Visit: Payer: Self-pay | Admitting: Gastroenterology

## 2019-02-12 ENCOUNTER — Encounter: Payer: Self-pay | Admitting: Gastroenterology

## 2019-02-14 ENCOUNTER — Encounter: Payer: Self-pay | Admitting: Family Medicine

## 2019-02-25 ENCOUNTER — Other Ambulatory Visit: Payer: Self-pay | Admitting: Family Medicine

## 2019-02-25 NOTE — Telephone Encounter (Signed)
Last office visit 12/07/2018 for 3 month follow up.   Last refilled 11/08/2018 for #30 with no refills.  Next Appt: 03/11/2019 for 3 month follow up on DM.

## 2019-02-25 NOTE — Telephone Encounter (Signed)
ERx 

## 2019-02-27 ENCOUNTER — Ambulatory Visit: Payer: Self-pay | Admitting: Gastroenterology

## 2019-03-08 ENCOUNTER — Ambulatory Visit (INDEPENDENT_AMBULATORY_CARE_PROVIDER_SITE_OTHER): Payer: Self-pay | Admitting: Gastroenterology

## 2019-03-08 ENCOUNTER — Encounter: Payer: Self-pay | Admitting: Gastroenterology

## 2019-03-08 VITALS — BP 146/82 | HR 74 | Temp 97.4°F | Ht 61.0 in | Wt 184.0 lb

## 2019-03-08 DIAGNOSIS — R9389 Abnormal findings on diagnostic imaging of other specified body structures: Secondary | ICD-10-CM

## 2019-03-08 DIAGNOSIS — K7460 Unspecified cirrhosis of liver: Secondary | ICD-10-CM

## 2019-03-08 DIAGNOSIS — E119 Type 2 diabetes mellitus without complications: Secondary | ICD-10-CM

## 2019-03-08 DIAGNOSIS — Z1159 Encounter for screening for other viral diseases: Secondary | ICD-10-CM

## 2019-03-08 DIAGNOSIS — K5909 Other constipation: Secondary | ICD-10-CM

## 2019-03-08 DIAGNOSIS — Z794 Long term (current) use of insulin: Secondary | ICD-10-CM

## 2019-03-08 NOTE — Progress Notes (Signed)
03/08/2019 Karen [name] [id number] 1955-09-06   HISTORY OF PRESENT ILLNESS: This is a 63 year old female who is new to our office.  She has been referred here by her PCP, Dr. Danise Mina, for evaluation regarding constipation.  She tells me that in June she went to the emergency department for complaints of abdominal pain.  CT scan of the abdomen and pelvis with contrast was performed and showed the following:    IMPRESSION: 1. Nodular contour of the liver, consistent with cirrhosis. 2. Recanalization of the umbilical vein, which may be seen with portal hypertension. 3. Circumferential thickening of the rectum, significance uncertain. Correlation with colonoscopy on a nonemergent basis may be considered. 4. Normal appearance of the lumbosacral spine.  They gave her dicyclomine to use as needed, but at this point her abdominal pain has resolved.  She reports issues with chronic constipation.  Uses stool softeners in the form of Colace 100 mg twice daily and MiraLAX as needed.  The cirrhosis is a new diagnosis.  CBC is within normal limits except for a slightly low platelet count at 126.  CMP was within normal limits except for an elevated glucose at 357.  She says that her blood sugars are not well controlled.  She is on insulin.  Hepatitis C antibody was negative in 2017.  Currently she does not have insurance and is self-pay.  She has never had colonoscopy in the past.  She denies any rectal bleeding.  No family history of colon cancer to her knowledge.   Past Medical History:  Diagnosis Date  . Body aches 03/24/2016   Resolved off simvastatin.   Marland Kitchen CAD (coronary artery disease)    STEMI 07/2013 - treated with PTCA of the RCA followed by DES to the intermediate ramus  //  Myoview 5/17 - Normal perfusion. LVEF 71% with normal wall motion. This is a low risk study.  . Chronic bronchitis   . Cirrhosis ([place])   . Depression   . Diabetes ([place])   . GERD (gastroesophageal reflux  disease)   . High blood pressure   . History of chicken pox   . History of ovarian cyst   . HLD (hyperlipidemia)   . Seasonal allergies   . Seizure ([place]) 1977   off AEDs  . SVT (supraventricular tachycardia) ([place]) 10/2015   s/p catheter ablation  . Syncopal episodes   . Tachyarrhythmia    ER visits with adenosine push to control  . Tubal pregnancy   . Type 2 diabetes, uncontrolled, with neuropathy ([place])    dx 1993   Past Surgical History:  Procedure Laterality Date  . ABLATION OF DYSRHYTHMIC FOCUS  11/06/2015   SVT  . CARDIAC CATHETERIZATION  07/2013   angioplasty to RCA and DES to ramus intermedius  . DILATION AND CURETTAGE OF UTERUS  1984  . ECTOPIC PREGNANCY SURGERY Right 1984   tube removal 2/2 hemorrhage  . ELECTROPHYSIOLOGIC STUDY N/A 11/06/2015   SVT Ablation;  Evans Lance, MD;  Location: [facility] CV LAB  . LEFT HEART CATHETERIZATION WITH CORONARY ANGIOGRAM N/A 07/23/2013   Procedure: LEFT HEART CATHETERIZATION WITH CORONARY ANGIOGRAM;  Surgeon: Sinclair Grooms, MD;  Location: [facility] CATH LAB;  Service: Cardiovascular;  Laterality: N/A;  . OVARIAN CYST SURGERY  1998  . PERCUTANEOUS CORONARY STENT INTERVENTION (PCI-S) N/A 07/24/2013   Procedure: PERCUTANEOUS CORONARY STENT INTERVENTION (PCI-S);  Surgeon: Sinclair Grooms, MD;  Location: [facility] CATH LAB;  Service: Cardiovascular;  Laterality: N/A;  .  ROTATOR CUFF REPAIR  2006   Right  . SHOULDER ARTHROSCOPY Left 02/2017   LEFT shoulder arthroscopy with limited debridement, decompression/acromioplasty, open rotator cuff repair and biceps tenodesis ([facility])  . SUBMANDIBULAR DUCT LIGATION Right 10/2014   R submandibular gland stone removed [facility])  . [place]    reports that she has never smoked. She has never used smokeless tobacco. She reports that she does not drink alcohol or use drugs. family history includes Arrhythmia in her brother; Breast cancer in her paternal aunt; Cirrhosis in her  maternal grandmother; Congestive Heart Failure in her father; Coronary artery disease in her father and mother; Diabetes in her brother, brother, father, maternal grandfather, maternal grandmother, mother, paternal grandfather, paternal grandmother, and sister; Heart disease in her brother and mother; Hyperlipidemia in her father and mother; Hypertension in her father and mother; Pancreatic cancer in her father; Stroke in her mother. Allergies  Allergen Reactions  . Clindamycin/Lincomycin Nausea And Vomiting and Other (See Comments)    Pt refuses to retry  . Erythromycin Other (See Comments)    Stomach cramps  . Lipitor [Atorvastatin] Nausea Only  . Penicillins Rash and Other (See Comments)    Has patient had a PCN reaction causing immediate rash, facial/tongue/throat swelling, SOB or lightheadedness with hypotension: Yes Has patient had a PCN reaction causing severe rash involving mucus membranes or skin necrosis: No Has patient had a PCN reaction that required hospitalization No Has patient had a PCN reaction occurring within the last 10 years: No If all of the above answers are "NO", then may proceed with Cephalosporin use.        Outpatient Encounter Medications as of 03/08/2019  Medication Sig  . acetaminophen (TYLENOL) 500 MG tablet Take 1,000 mg by mouth 3 (three) times daily as needed for mild pain.  Marland Kitchen aspirin EC 81 MG EC tablet Take 1 tablet (81 mg total) by mouth daily.  Marland Kitchen dicyclomine (BENTYL) 20 MG tablet Take 1 tablet (20 mg total) by mouth 2 (two) times daily as needed for spasms.  Marland Kitchen docusate sodium (COLACE) 100 MG capsule Take 100 mg by mouth 2 (two) times daily.  . furosemide (LASIX) 20 MG tablet Take 1 tablet (20 mg total) by mouth daily as needed for edema.  . gabapentin (NEURONTIN) 300 MG capsule Take 1 capsule (300 mg total) by mouth at bedtime.  . Insulin Glargine (LANTUS SOLOSTAR) 100 UNIT/ML Solostar Pen Inject 60 Units into the skin 2 (two) times daily. QS 1 mo  .  Insulin Pen Needle (PEN NEEDLES) 31G X 8 MM MISC Use to inject Lantus as directed twice daily. Dx: E11.40  . insulin regular (NOVOLIN R) 100 units/mL injection Inject 0.15 mLs (15 Units total) into the skin 3 (three) times daily before meals. + sliding scale insulin 5u for every 50 over 200  . KLOR-CON SPRINKLE 10 MEQ CR capsule Take 1 capsule (10 mEq total) by mouth daily as needed. When taking Lasix  . losartan (COZAAR) 25 MG tablet Take 1 tablet (25 mg total) by mouth daily.  . metFORMIN (GLUCOPHAGE XR) 750 MG 24 hr tablet Take 2 tablets (1,500 mg total) by mouth every evening.  . nitroGLYCERIN (NITROSTAT) 0.4 MG SL tablet Place 1 tablet (0.4 mg total) under the tongue every 5 (five) minutes as needed for chest pain.  Marland Kitchen omeprazole (PRILOSEC) 40 MG capsule Take 1 capsule (40 mg total) by mouth daily.  . polyethylene glycol (MIRALAX / GLYCOLAX) 17 g packet Take 17 g  by mouth daily as needed.  . rosuvastatin (CRESTOR) 5 MG tablet Take 1 tablet (5 mg total) by mouth daily.  . sertraline (ZOLOFT) 50 MG tablet Take 1 tablet (50 mg total) by mouth daily.  . traMADol (ULTRAM) 50 MG tablet Take 1 tablet (50 mg total) by mouth daily as needed for moderate pain.   No facility-administered encounter medications on file as of 03/08/2019.      REVIEW OF SYSTEMS  : All other systems reviewed and negative except where noted in the History of Present Illness.   PHYSICAL EXAM: BP (!) 146/82   Pulse 74   Temp (!) 97.4 F (36.3 C) (Temporal)   Ht 5\' 1"  (1.549 m)   Wt 184 lb (83.5 kg)   BMI 34.77 kg/m  General: Well developed white female in no acute distress Head: Normocephalic and atraumatic Eyes:  Sclerae anicteric, conjunctiva pink. Ears: Normal auditory acuity Lungs: Clear throughout to auscultation; no increased WOB. Heart: Regular rate and rhythm; no M/R/G. Abdomen: Soft, non-distended.  BS present.  Non-tender. Rectal:  Will be done at the time of colonoscopy. Musculoskeletal: Symmetrical  with no gross deformities  Skin: No lesions on visible extremities Extremities: No edema  Neurological: Alert oriented x 4, grossly non-focal Psychological:  Alert and cooperative. Normal mood and affect  ASSESSMENT AND PLAN: *Abnormal CT scan showing circumferential thickening of the rectum of uncertain significance.  This was back in June.  She is never had colonoscopy.  We will plan for colonoscopy with Dr. Havery Moros. *Chronic constipation: Discussed using MiraLAX daily and increasing to twice daily if needed.  She will be given a 2-day colonoscopy bowel prep. *IDDM:  Insulin will be adjusted prior to endoscopic procedure per protocol. Will resume normal dosing after procedure. *Cirrhosis of the liver: Unknown source at this point.  Hep C was negative in 2017.  LFTs are normal.  Platelets slightly low at 126.  We will plan for EGD in addition to her colonoscopy in order to screen for esophageal varices.  I would like to check a PT/INR and AFP level.  At some point we should probably do extensive liver serologies to rule out other causes of chronic liver disease, but patient is self-pay with no insurance coverage at this time.  We will hold off for now.  **The risks, benefits, and alternatives to EGD and colonoscopy were discussed with the patient and she consents to proceed.   CC:  [name], MD

## 2019-03-08 NOTE — Patient Instructions (Addendum)
If you are age 61 or older, your body mass index should be between 23-30. Your Body mass index is 34.77 kg/m. If this is out of the aforementioned range listed, please consider follow up with your Primary Care Provider.  If you are age 61 or younger, your body mass index should be between 19-25. Your Body mass index is 34.77 kg/m. If this is out of the aformentioned range listed, please consider follow up with your Primary Care Provider.   You have been scheduled for an endoscopy and colonoscopy. Please follow the written instructions given to you at your visit today.  If you use inhalers (even only as needed), please bring them with you on the day of your procedure. Your physician has requested that you go to www.startemmi.com and enter the access code given to you at your visit today. This web site gives a general overview about your procedure. However, you should still follow specific instructions given to you by our office regarding your preparation for the procedure.  We have sent the following medications to your pharmacy for you to pick up at your convenience: Suprep  Please have PT/INR drawn with other labs on Monday.  Due to recent COVID-19 restrictions implemented by [employer] and state authorities and in an effort to keep both patients and staff as safe as possible, [facility] requires COVID-19 testing prior to any scheduled endoscopic procedure. The testing center is located at [address] [place], [place] [address] in the [facility] [employer]  suite.  Your appointment has been scheduled for 04/23/19 at 9 am.   Please bring your insurance cards to this appointment. You will require your COVID screen 2 business days prior to your endoscopic procedure.  You are not required to quarantine after your screening.  You will only receive a phone call with the results if it is POSITIVE.  If you do not receive a call the day before  your procedure you should begin your prep, if ordered, and you should report to the endo center for your procedure at your designated appointment arrival time ( one hour prior to the procedure time). There is no cost to you for the screening on the day of the swab.  [facility] will file with your insurance company for the testing.    You may receive an automated phone call prior to your procedure or have a message in your MyChart that you have an appointment for a BP/15 at the [facility], please disregard this message.  Your testing will be at the [place] [place] location.   If you are leaving [facility] travel [place] on [address]. [address], turn left onto [address], turn night onto [address] at the 1st stop light turn right, pass the [employer] on your right and proceed to [place] [place] (white building).    Thank you for choosing me and [facility].   Alonza Bogus, PA-C

## 2019-03-11 ENCOUNTER — Ambulatory Visit: Payer: Self-pay | Admitting: Family Medicine

## 2019-03-12 ENCOUNTER — Other Ambulatory Visit: Payer: Self-pay | Admitting: Family Medicine

## 2019-03-12 NOTE — Telephone Encounter (Signed)
E-scribed refill.  Pls schedule CPE and lab visits.  

## 2019-03-12 NOTE — Telephone Encounter (Signed)
Pt already has appointment scheduled for 3 month follow up on 03/20/19 we will get her to scheduled cpe at this visit.

## 2019-03-15 ENCOUNTER — Telehealth (HOSPITAL_COMMUNITY): Payer: Self-pay | Admitting: *Deleted

## 2019-03-15 NOTE — Telephone Encounter (Signed)
Attempted to call patient to give Pap smear results. No one answered the phone. Left voicemail for patient to call me back.

## 2019-03-20 ENCOUNTER — Ambulatory Visit: Payer: Self-pay | Admitting: Family Medicine

## 2019-03-22 ENCOUNTER — Telehealth (HOSPITAL_COMMUNITY): Payer: Self-pay | Admitting: *Deleted

## 2019-03-22 NOTE — Telephone Encounter (Signed)
According to note on patients Pap smear result that result is visibile to patient in Mychart and patients Mychart is active.

## 2019-03-22 NOTE — Telephone Encounter (Signed)
Attempted to call patient again to give Pap smear results. No one answered the phone. Left voicemail for patient to call me back.

## 2019-03-25 ENCOUNTER — Encounter: Payer: Self-pay | Admitting: Gastroenterology

## 2019-03-25 DIAGNOSIS — Z1159 Encounter for screening for other viral diseases: Secondary | ICD-10-CM | POA: Insufficient documentation

## 2019-03-25 DIAGNOSIS — R9389 Abnormal findings on diagnostic imaging of other specified body structures: Secondary | ICD-10-CM | POA: Insufficient documentation

## 2019-03-25 NOTE — Progress Notes (Signed)
Agree with assessment and plan. Will proceed with EGD and colonoscopy. She also warrants workup for chronic liver disease when she can afford it and get serologies done and will need monitoring for this over time.

## 2019-04-02 ENCOUNTER — Telehealth: Payer: Self-pay

## 2019-04-02 ENCOUNTER — Ambulatory Visit: Payer: Self-pay | Admitting: Family Medicine

## 2019-04-02 NOTE — Telephone Encounter (Signed)
Patient contacted our office to reschedule 3 month follow up. Patient has been rescheduled to 04/10/19.  Patient was also wondering about her colonoscopy clearance. She states that she has a colonoscopy scheduled on 04/25/19 and she states her GI doctor had put in some lab orders for her, and also mentioned that they were gong to be faxing paperwork to our office, as well as her cardiologist, for a surgical clearance. Patient is wondering if she should have her clearance done at our office, or with her cardiologist? Please advise. Thanks!

## 2019-04-03 NOTE — Telephone Encounter (Addendum)
Will await paperwork - haven't received yet. If cardiac clearance required will need done with cardiology. We can review at OV next week.  plz get updated sugar log to titrate insulin as needed.

## 2019-04-03 NOTE — Telephone Encounter (Signed)
Lvm for pt to call back.  Need to relay Dr. G's message.  

## 2019-04-04 NOTE — Telephone Encounter (Addendum)
Lvm for pt to call back.  Need to relay Dr. Synthia Innocent message.  Will forward Dr. Synthia Innocent message to pt via MyChart since she uses it often.

## 2019-04-05 ENCOUNTER — Telehealth (HOSPITAL_COMMUNITY): Payer: Self-pay | Admitting: *Deleted

## 2019-04-05 NOTE — Telephone Encounter (Signed)
Patient returned my phone call and left voicemail. Called patient back and patient stated she had received her Pap smear results through [facility]. Let patient know they were normal with negative HPV and that her next Pap smear is due in 5 years. Told patient she does need mammograms every year and if still eligible can come to [facility] to have completed next year. Patient verbalized understanding.

## 2019-04-05 NOTE — Telephone Encounter (Signed)
Patient returned my phone call and left voicemail to call her back. Attempted to call patient again to give Pap smear results. No one answered the phone. Left voicemail for patient to call me back.

## 2019-04-10 ENCOUNTER — Encounter: Payer: Self-pay | Admitting: Family Medicine

## 2019-04-10 ENCOUNTER — Other Ambulatory Visit: Payer: Self-pay

## 2019-04-10 ENCOUNTER — Ambulatory Visit (INDEPENDENT_AMBULATORY_CARE_PROVIDER_SITE_OTHER): Payer: Self-pay | Admitting: Family Medicine

## 2019-04-10 VITALS — BP 120/64 | HR 74 | Temp 97.8°F | Ht 61.0 in | Wt 182.6 lb

## 2019-04-10 DIAGNOSIS — E785 Hyperlipidemia, unspecified: Secondary | ICD-10-CM

## 2019-04-10 DIAGNOSIS — K5909 Other constipation: Secondary | ICD-10-CM

## 2019-04-10 DIAGNOSIS — K7460 Unspecified cirrhosis of liver: Secondary | ICD-10-CM

## 2019-04-10 DIAGNOSIS — E1169 Type 2 diabetes mellitus with other specified complication: Secondary | ICD-10-CM

## 2019-04-10 DIAGNOSIS — E119 Type 2 diabetes mellitus without complications: Secondary | ICD-10-CM

## 2019-04-10 DIAGNOSIS — Z794 Long term (current) use of insulin: Secondary | ICD-10-CM

## 2019-04-10 DIAGNOSIS — R9389 Abnormal findings on diagnostic imaging of other specified body structures: Secondary | ICD-10-CM

## 2019-04-10 DIAGNOSIS — Z23 Encounter for immunization: Secondary | ICD-10-CM

## 2019-04-10 DIAGNOSIS — E113399 Type 2 diabetes mellitus with moderate nonproliferative diabetic retinopathy without macular edema, unspecified eye: Secondary | ICD-10-CM

## 2019-04-10 LAB — LIPID PANEL
Cholesterol: 181 mg/dL (ref 0–200)
HDL: 39.6 mg/dL (ref >39.00)
NonHDL: 141.35
Total CHOL/HDL Ratio: 5
Triglycerides: 206 mg/dL — ABNORMAL HIGH (ref 0.0–149.0)
VLDL: 41.2 mg/dL — ABNORMAL HIGH (ref 0.0–40.0)

## 2019-04-10 LAB — PROTIME-INR
INR: 1 ratio (ref 0.8–1.0)
Prothrombin Time: 12 s (ref 9.6–13.1)

## 2019-04-10 LAB — POCT GLYCOSYLATED HEMOGLOBIN (HGB A1C): Hemoglobin A1C: 9.6 % — AB (ref 4.0–5.6)

## 2019-04-10 LAB — LDL CHOLESTEROL, DIRECT: Direct LDL: 89 mg/dL

## 2019-04-10 MED ORDER — ROSUVASTATIN CALCIUM 5 MG PO TABS: 5.0000 mg | ORAL_TABLET | Freq: Every day | ORAL | 3 refills | Status: DC

## 2019-04-10 MED ORDER — LANTUS SOLOSTAR 100 UNIT/ML ~~LOC~~ SOPN: PEN_INJECTOR | SUBCUTANEOUS | 3 refills | Status: DC

## 2019-04-10 NOTE — Patient Instructions (Addendum)
Flu shot today GI labs today  Continue novolin R 20 units with meals + sliding scale, increase lantus to 65 units at bedtime and 60 units with breakfast.  Return in 3 months for follow up visit.

## 2019-04-10 NOTE — Assessment & Plan Note (Signed)
Update FLP. Continue crestor 5mg  daily. The 10-year ASCVD risk score Karen [name] Karen [name]., et al., 2013) is: 12.6%   Values used to calculate the score:     Age: 63 years     Sex: Female     Is Non-Hispanic African American: No     Diabetic: Yes     Tobacco smoker: No     Systolic Blood Pressure: 123456 mmHg     Is BP treated: Yes     HDL Cholesterol: 27.7 mg/dL     Total Cholesterol: 205 mg/dL

## 2019-04-10 NOTE — Assessment & Plan Note (Signed)
Ongoing trouble with chronic constipation. She has restarted daily miralax.

## 2019-04-10 NOTE — Assessment & Plan Note (Signed)
Overdue for f/u - trouble affording f/u.

## 2019-04-10 NOTE — Assessment & Plan Note (Addendum)
Improvement noted based on latest A1c but still remains uncontrolled. No true pattern or trend to her cbg's. She does tend to titrate insulin on her own, rec against this. Anticipate trouble controlling due to sporadic sleep and meal schedule. Will increase novolin R to 20u with meals + SSI which she already increased on her own, will continue titrating basal insulin for goal fasting cbg 80-130. RTC 3 mo f/u visit but I did ask her to send me cbg report Q2 wks to titrate accordingly until then.

## 2019-04-10 NOTE — Progress Notes (Signed)
This visit was conducted in person.  BP 120/64 (BP Location: Left Arm, Patient Position: Sitting, Cuff Size: Normal)   Pulse 74   Temp 97.8 F (36.6 C) (Temporal)   Ht 5\' 1"  (1.549 m)   Wt 182 lb 9 oz (82.8 kg)   SpO2 98%   BMI 34.49 kg/m    CC: 3 mo DM /fu visit Subjective:    Patient ID: Karen [name], female    DOB: Oct 06, 1955, 63 y.o.   MRN: [id number]  HPI: Karen [name] is a 63 y.o. female presenting on 04/10/2019 for Diabetes (Here for 3 mo f/u.) and Immunizations (Pt asks if she needs a 2nd Pneumovax. )   Upcoming colonoscopy 04/25/2019. Requests PT/INR today.  She had reassuring mammogram 01/2019 and pap smear 01/2019 (through scholarship program). Told could rpt pap in 5 yrs. Breast and Cervical Cancer Control Program.   DM - does regularly check sugars and brings log - ranging 110-300s, no significant pattern or trend noted. Compliant with antihyperglycemic regimen which includes: metformin XR 1500mg  in evenings, lantus 60u BID, novolin R 15u TID AC + sliding scale 5u for every 50 >200 - she actually bumped up novolin R to 20u with meals. Denies low sugars or hypoglycemic symptoms. Denies paresthesias. Last diabetic eye exam OVERDUE. Pneumovax: completed 05/2013. Prevnar: not due. Glucometer brand: Auvon. DSME: declines - states unaffordable.  Lab Results  Component Value Date   HGBA1C 9.6 (A) 04/10/2019   Diabetic Foot Exam - Simple   No data filed     Lab Results  Component Value Date   MICROALBUR 3.3 (H) 07/02/2015         Relevant past medical, surgical, family and social history reviewed and updated as indicated. Interim medical history since our last visit reviewed. Allergies and medications reviewed and updated. Outpatient Medications Prior to Visit  Medication Sig Dispense Refill  . acetaminophen (TYLENOL) 500 MG tablet Take 1,000 mg by mouth 3 (three) times daily as needed for mild pain.    Marland Kitchen aspirin EC 81 MG EC tablet Take 1 tablet (81 mg total) by  mouth daily.    Marland Kitchen dicyclomine (BENTYL) 20 MG tablet Take 1 tablet (20 mg total) by mouth 2 (two) times daily as needed for spasms. 45 tablet 3  . furosemide (LASIX) 20 MG tablet Take 1 tablet (20 mg total) by mouth daily as needed for edema. 30 tablet 3  . gabapentin (NEURONTIN) 300 MG capsule Take 1 capsule (300 mg total) by mouth at bedtime. 90 capsule 3  . Insulin Pen Needle (PEN NEEDLES) 31G X 8 MM MISC Use to inject Lantus as directed twice daily. Dx: E11.40 60 each 3  . insulin regular (NOVOLIN R) 100 units/mL injection Inject 0.2 mLs (20 Units total) into the skin 3 (three) times daily before meals. + sliding scale insulin 5u for every 50 over 200 10 mL 6  . KLOR-CON SPRINKLE 10 MEQ CR capsule Take 1 capsule (10 mEq total) by mouth daily as needed. When taking Lasix 30 capsule 3  . losartan (COZAAR) 25 MG tablet Take 1 tablet (25 mg total) by mouth daily. 90 tablet 3  . metFORMIN (GLUCOPHAGE XR) 750 MG 24 hr tablet Take 2 tablets (1,500 mg total) by mouth every evening. 60 tablet 6  . nitroGLYCERIN (NITROSTAT) 0.4 MG SL tablet Place 1 tablet (0.4 mg total) under the tongue every 5 (five) minutes as needed for chest pain. 25 tablet 0  . omeprazole (PRILOSEC) 40 MG capsule  Take 1 capsule (40 mg total) by mouth daily. 30 capsule 11  . polyethylene glycol (MIRALAX / GLYCOLAX) 17 g packet Take 17 g by mouth daily as needed.    . sertraline (ZOLOFT) 50 MG tablet Take 1 tablet (50 mg total) by mouth daily. 30 tablet 11  . traMADol (ULTRAM) 50 MG tablet Take 1 tablet (50 mg total) by mouth daily as needed for moderate pain. 30 tablet 0  . Insulin Glargine (LANTUS SOLOSTAR) 100 UNIT/ML Solostar Pen Inject 60 Units into the skin 2 (two) times daily. QS 1 mo 15 pen 3  . insulin regular (NOVOLIN R) 100 units/mL injection Inject 0.15 mLs (15 Units total) into the skin 3 (three) times daily before meals. + sliding scale insulin 5u for every 50 over 200    . rosuvastatin (CRESTOR) 5 MG tablet Take 1 tablet  (5 mg total) by mouth daily. 30 tablet 2  . docusate sodium (COLACE) 100 MG capsule Take 100 mg by mouth 2 (two) times daily.     No facility-administered medications prior to visit.      Per HPI unless specifically indicated in ROS section below Review of Systems Objective:    BP 120/64 (BP Location: Left Arm, Patient Position: Sitting, Cuff Size: Normal)   Pulse 74   Temp 97.8 F (36.6 C) (Temporal)   Ht 5\' 1"  (1.549 m)   Wt 182 lb 9 oz (82.8 kg)   SpO2 98%   BMI 34.49 kg/m   Wt Readings from Last 3 Encounters:  04/10/19 182 lb 9 oz (82.8 kg)  03/08/19 184 lb (83.5 kg)  02/07/19 183 lb (83 kg)    Physical Exam Vitals signs and nursing note reviewed.  Constitutional:      Appearance: Normal appearance. She is obese.  Neurological:     Mental Status: She is alert.  Psychiatric:        Mood and Affect: Mood normal.        Behavior: Behavior normal.       Results for orders placed or performed in visit on 04/10/19  POCT glycosylated hemoglobin (Hb A1C)  Result Value Ref Range   Hemoglobin A1C 9.6 (A) 4.0 - 5.6 %   HbA1c POC (<> result, manual entry)     HbA1c, POC (prediabetic range)     HbA1c, POC (controlled diabetic range)     Assessment & Plan:  This visit occurred during the SARS-CoV-2 public health emergency.  Safety protocols were in place, including screening questions prior to the visit, additional usage of staff PPE, and extensive cleaning of exam room while observing appropriate contact time as indicated for disinfecting solutions.   Problem List Items Addressed This Visit    Insulin dependent type 2 diabetes mellitus ([place]) - Primary    Improvement noted based on latest A1c but still remains uncontrolled. No true pattern or trend to her cbg's. She does tend to titrate insulin on her own, rec against this. Anticipate trouble controlling due to sporadic sleep and meal schedule. Will increase novolin R to 20u with meals + SSI which she already increased on her  own, will continue titrating basal insulin for goal fasting cbg 80-130. RTC 3 mo f/u visit but I did ask her to send me cbg report Q2 wks to titrate accordingly until then.       Relevant Medications   Insulin Glargine (LANTUS SOLOSTAR) 100 UNIT/ML Solostar Pen   insulin regular (NOVOLIN R) 100 units/mL injection   rosuvastatin (CRESTOR) 5 MG  tablet   Other Relevant Orders   POCT glycosylated hemoglobin (Hb A1C) (Completed)   Dyslipidemia associated with type 2 diabetes mellitus ([place])    Update FLP. Continue crestor 5mg  daily. The 10-year ASCVD risk score Mikey Bussing DC Brooke Bonito., et al., 2013) is: 12.6%   Values used to calculate the score:     Age: 40 years     Sex: Female     Is Non-Hispanic African American: No     Diabetic: Yes     Tobacco smoker: No     Systolic Blood Pressure: 123456 mmHg     Is BP treated: Yes     HDL Cholesterol: 27.7 mg/dL     Total Cholesterol: 205 mg/dL       Relevant Medications   Insulin Glargine (LANTUS SOLOSTAR) 100 UNIT/ML Solostar Pen   insulin regular (NOVOLIN R) 100 units/mL injection   rosuvastatin (CRESTOR) 5 MG tablet   Other Relevant Orders   Lipid panel   Diabetic retinopathy ([place])    Overdue for f/u - trouble affording f/u.       Relevant Medications   Insulin Glargine (LANTUS SOLOSTAR) 100 UNIT/ML Solostar Pen   insulin regular (NOVOLIN R) 100 units/mL injection   rosuvastatin (CRESTOR) 5 MG tablet   Cirrhosis of liver ([place])    Appreciate GI care - pending colonoscopy/EGD. Check PT/INR and AFP today.       Chronic constipation    Ongoing trouble with chronic constipation. She has restarted daily miralax.       Abnormal CT scan    Other Visit Diagnoses    Need for influenza vaccination       Relevant Orders   Flu Vaccine QUAD 36+ mos IM (Completed)       Meds ordered this encounter  Medications  . Insulin Glargine (LANTUS SOLOSTAR) 100 UNIT/ML Solostar Pen    Sig: Inject 60 units in the morning and 65 units in the evenings. QS 3 mo     Dispense:  15 pen    Refill:  3    90 day supply please. DX. E11.9  . rosuvastatin (CRESTOR) 5 MG tablet    Sig: Take 1 tablet (5 mg total) by mouth daily.    Dispense:  90 tablet    Refill:  3    This prescription was filled on 03/12/2019. Any refills authorized will be placed on file.   Orders Placed This Encounter  Procedures  . Flu Vaccine QUAD 36+ mos IM  . Lipid panel  . POCT glycosylated hemoglobin (Hb A1C)    Patient Instructions  Flu shot today GI labs today  Continue novolin R 20 units with meals + sliding scale, increase lantus to 65 units at bedtime and 60 units with breakfast.  Return in 3 months for follow up visit.    Follow up plan: Return in about 3 months (around 07/11/2019) for follow up visit.  [name], MD

## 2019-04-10 NOTE — Assessment & Plan Note (Signed)
Appreciate GI care - pending colonoscopy/EGD. Check PT/INR and AFP today.

## 2019-04-12 LAB — AFP TUMOR MARKER: AFP-Tumor Marker: 2.2 ng/mL

## 2019-04-16 HISTORY — PX: COLONOSCOPY WITH ESOPHAGOGASTRODUODENOSCOPY (EGD): SHX5779

## 2019-04-18 ENCOUNTER — Telehealth: Payer: Self-pay | Admitting: Family Medicine

## 2019-04-18 NOTE — Telephone Encounter (Addendum)
plz notify - AFP is alpha fetoprotein test looking for liver cancer in h/o cirrhosis. Her test was normal.   This is strange - my name is listed as responsible provider but I never received results of this or of INR to my desktop nor ordered them. I believe this was courtesy draw for [facility] as pt was having blood work at our office on same day, but somehow my name got placed as ordering provider - and then I never received results. I touched base with my lab about this.

## 2019-04-18 NOTE — Telephone Encounter (Signed)
Left message on vm per dpr relaying Dr. Synthia Innocent message about what the lab is and that it is normal.

## 2019-04-18 NOTE — Telephone Encounter (Signed)
Pt called she has question about her lab results  afp tumer marker  Results 2.2  What does this mean  She stated you can send message through my chart

## 2019-04-23 ENCOUNTER — Ambulatory Visit (INDEPENDENT_AMBULATORY_CARE_PROVIDER_SITE_OTHER): Payer: Self-pay

## 2019-04-23 ENCOUNTER — Other Ambulatory Visit: Payer: Self-pay | Admitting: Gastroenterology

## 2019-04-23 DIAGNOSIS — Z1159 Encounter for screening for other viral diseases: Secondary | ICD-10-CM

## 2019-04-24 LAB — SARS CORONAVIRUS 2 (TAT 6-24 HRS): SARS Coronavirus 2: NEGATIVE

## 2019-04-25 ENCOUNTER — Other Ambulatory Visit: Payer: Self-pay

## 2019-04-25 ENCOUNTER — Encounter: Payer: Self-pay | Admitting: Family Medicine

## 2019-04-25 ENCOUNTER — Ambulatory Visit (AMBULATORY_SURGERY_CENTER): Payer: Self-pay | Admitting: Gastroenterology

## 2019-04-25 ENCOUNTER — Encounter: Payer: Self-pay | Admitting: Gastroenterology

## 2019-04-25 VITALS — BP 166/72 | HR 72 | Temp 98.3°F | Resp 12 | Ht 61.0 in | Wt 184.0 lb

## 2019-04-25 DIAGNOSIS — K317 Polyp of stomach and duodenum: Secondary | ICD-10-CM

## 2019-04-25 DIAGNOSIS — K7460 Unspecified cirrhosis of liver: Secondary | ICD-10-CM

## 2019-04-25 DIAGNOSIS — I8510 Secondary esophageal varices without bleeding: Secondary | ICD-10-CM

## 2019-04-25 DIAGNOSIS — R933 Abnormal findings on diagnostic imaging of other parts of digestive tract: Secondary | ICD-10-CM

## 2019-04-25 DIAGNOSIS — D123 Benign neoplasm of transverse colon: Secondary | ICD-10-CM

## 2019-04-25 DIAGNOSIS — D127 Benign neoplasm of rectosigmoid junction: Secondary | ICD-10-CM

## 2019-04-25 DIAGNOSIS — K635 Polyp of colon: Secondary | ICD-10-CM

## 2019-04-25 DIAGNOSIS — K2950 Unspecified chronic gastritis without bleeding: Secondary | ICD-10-CM

## 2019-04-25 DIAGNOSIS — K2970 Gastritis, unspecified, without bleeding: Secondary | ICD-10-CM

## 2019-04-25 DIAGNOSIS — D124 Benign neoplasm of descending colon: Secondary | ICD-10-CM

## 2019-04-25 MED ORDER — SODIUM CHLORIDE 0.9 % IV SOLN: 500.0000 mL | Freq: Once | INTRAVENOUS | Status: DC

## 2019-04-25 MED ORDER — PROPRANOLOL HCL 10 MG PO TABS: ORAL_TABLET | ORAL | 3 refills | Status: DC

## 2019-04-25 NOTE — Patient Instructions (Signed)
HANDOUTS PROVIDED ON: POLYPS  THE POLYPS REMOVED TODAY HAVE BEEN SENT FOR PATHOLOGY.  THE RESULTS CAN TAKE 2-3 WEEKS TO RECEIVE.  BASED ON THE RESULTS IS WHEN YOUR NEXT COLONOSCOPY WILL BE RECOMMENDED.  YOU MAY RESUME YOUR PREVIOUS DIET.  TAKE OMEPRAZOLE TWICE A DAY NOW.  AVOID ALL NSAIDS.  START PROPRANOLOL 10mg  TWICE A DAY AND INCREASE TO 20mg  TWICE A DAY AS TOLERATED.  [place] YOU FOR ALLOWING [place] TO CARE FOR YOU TODAY!!!  YOU HAD AN ENDOSCOPIC PROCEDURE TODAY AT [facility]:   Refer to the procedure report that was given to you for any specific questions about what was found during the examination.  If the procedure report does not answer your questions, please call your gastroenterologist to clarify.  If you requested that your care partner not be given the details of your procedure findings, then the procedure report has been included in a sealed envelope for you to review at your convenience later.  YOU SHOULD EXPECT: Some feelings of bloating in the abdomen. Passage of more gas than usual.  Walking can help get rid of the air that was put into your GI tract during the procedure and reduce the bloating. If you had a lower endoscopy (such as a colonoscopy or flexible sigmoidoscopy) you may notice spotting of blood in your stool or on the toilet paper. If you underwent a bowel prep for your procedure, you may not have a normal bowel movement for a few days.  Please Note:  You might notice some irritation and congestion in your nose or some drainage.  This is from the oxygen used during your procedure.  There is no need for concern and it should clear up in a day or so.  SYMPTOMS TO REPORT IMMEDIATELY:   Following lower endoscopy (colonoscopy or flexible sigmoidoscopy):  Excessive amounts of blood in the stool  Significant tenderness or worsening of abdominal pains  Swelling of the abdomen that is new, acute  Fever of 100F or higher   Following upper endoscopy  (EGD)  Vomiting of blood or coffee ground material  New chest pain or pain under the shoulder blades  Painful or persistently difficult swallowing  New shortness of breath  Fever of 100F or higher  Black, tarry-looking stools  For urgent or emergent issues, a gastroenterologist can be reached at any hour by calling [phone].   DIET:  We do recommend a small meal at first, but then you may proceed to your regular diet.  Drink plenty of fluids but you should avoid alcoholic beverages for 24 hours.  ACTIVITY:  You should plan to take it easy for the rest of today and you should NOT DRIVE or use heavy machinery until tomorrow (because of the sedation medicines used during the test).    FOLLOW UP: Our staff will call the number listed on your records 48-72 hours following your procedure to check on you and address any questions or concerns that you may have regarding the information given to you following your procedure. If we do not reach you, we will leave a message.  We will attempt to reach you two times.  During this call, we will ask if you have developed any symptoms of COVID 19. If you develop any symptoms (ie: fever, flu-like symptoms, shortness of breath, cough etc.) before then, please call [phone].  If you test positive for Covid 19 in the 2 weeks post procedure, please call and report this information to [place].  If any biopsies were taken you will be contacted by phone or by letter within the next 1-3 weeks.  Please call us at [phone] if you have not heard about the biopsies in 3 weeks.    SIGNATURES/CONFIDENTIALITY: You and/or your care partner have signed paperwork which will be entered into your electronic medical record.  These signatures attest to the fact that that the information above on your After Visit Summary has been reviewed and is understood.  Full responsibility of the confidentiality of this discharge information lies with you and/or your  care-partner.

## 2019-04-25 NOTE — Op Note (Signed)
[facility] Patient Name: Karen [name] Procedure Date: 04/25/2019 10:53 AM MRN: [id number] Endoscopist: Remo Lipps P. Havery Moros , MD Age: 63 Referring MD:  Date of Birth: 1955-11-29 Gender: Female Account #: [id number] Procedure:                Upper GI endoscopy Indications:              Cirrhosis rule out esophageal varices. Unknown                            etiology of cirrhosis, workup pending Medicines:                Monitored Anesthesia Care Procedure:                Pre-Anesthesia Assessment:                           - Prior to the procedure, a History and Physical                            was performed, and patient medications and                            allergies were reviewed. The patient's tolerance of                            previous anesthesia was also reviewed. The risks                            and benefits of the procedure and the sedation                            options and risks were discussed with the patient.                            All questions were answered, and informed consent                            was obtained. Prior Anticoagulants: The patient has                            taken no previous anticoagulant or antiplatelet                            agents. ASA Grade Assessment: III - A patient with                            severe systemic disease. After reviewing the risks                            and benefits, the patient was deemed in                            satisfactory condition to undergo the procedure.  After obtaining informed consent, the endoscope was                            passed under direct vision. Throughout the                            procedure, the patient's blood pressure, pulse, and                            oxygen saturations were monitored continuously. The                            Endoscope was introduced through the mouth, and                            advanced to  the second part of duodenum. The upper                            GI endoscopy was accomplished without difficulty.                            The patient tolerated the procedure well. Scope In: Scope Out: Findings:                 The Z-line was regular.                           Grade II varices (small to medium sized which did                            not flatten with insufflation) were found in the                            lower third of the esophagus. No high risk stigmata                            for bleeding noted.                           The exam of the esophagus was otherwise normal.                           Patchy inflammation characterized by adherent blood                            and friability was found in the proximal gastric                            body. When using water jet to clear the mucosa, it                            was quite friable with oozing which stopped after  observation. No focal areas noted to treat.                           A single 6 mm sessile polyp was found in the                            gastric antrum, adenomatous appearing. The polyp                            was removed with a hot snare. Resection and                            retrieval were complete. To prevent bleeding after                            the polypectomy given friability of the mucosa in                            the stomach elsewhere, one hemostatic clip was                            successfully placed across the lesion.                           The exam of the stomach was otherwise normal.                           Biopsies were taken with a cold forceps in the                            gastric body, at the incisura and in the gastric                            antrum for Helicobacter pylori testing.                           The duodenal bulb and second portion of the                            duodenum were  normal. Complications:            No immediate complications. Estimated blood loss:                            Minimal. Estimated Blood Loss:     Estimated blood loss was minimal. Impression:               - Z-line regular.                           - Grade II esophageal varices.                           - Suspected portal hypertensive gastritis with  friability.                           - A single gastric polyp. Resected and retrieved.                            Clip was placed prophylactically.                           - Normal duodenal bulb and second portion of the                            duodenum.                           - Biopsies were taken with a cold forceps for                            Helicobacter pylori testing. Recommendation:           - Patient has a contact number available for                            emergencies. The signs and symptoms of potential                            delayed complications were discussed with the                            patient. Return to normal activities tomorrow.                            Written discharge instructions were provided to the                            patient.                           - Resume previous diet.                           - Continue present medications.                           - Increase omeprazole to twice daily dosing                           - Avoid all NSAIDs                           - Start propranolol 10mg  twice daily for varices /                            portal hypertensive gastritis, titrate up to 20mg                             twice daily as tolerated. Will relay  findings to                            Dr. Danise Mina, may need to adjust HTN regimen with                            adding propranolol                           - Await pathology results.                           - Further workup for cirrhosis when the patient can                             obtain insurance to proceed with this. Remo Lipps P. Armbruster, MD 04/25/2019 12:17:01 PM This report has been signed electronically.

## 2019-04-25 NOTE — Op Note (Signed)
[place] Patient Name: Karen [name] Procedure Date: 04/25/2019 10:51 AM MRN: [id number] Endoscopist: Remo Lipps P. Havery Moros , MD Age: 63 Referring MD:  Date of Birth: Dec 25, 1955 Gender: Female Account #: [id number] Procedure:                Colonoscopy Indications:              Abnormal CT of the GI tract - rectal thickening,                            chronic constipation, no prior colon cancer                            screening, history of cirrhosis Medicines:                Monitored Anesthesia Care Procedure:                Pre-Anesthesia Assessment:                           - Prior to the procedure, a History and Physical                            was performed, and patient medications and                            allergies were reviewed. The patient's tolerance of                            previous anesthesia was also reviewed. The risks                            and benefits of the procedure and the sedation                            options and risks were discussed with the patient.                            All questions were answered, and informed consent                            was obtained. Prior Anticoagulants: The patient has                            taken no previous anticoagulant or antiplatelet                            agents. ASA Grade Assessment: III - A patient with                            severe systemic disease. After reviewing the risks                            and benefits, the patient was deemed in  satisfactory condition to undergo the procedure.                           After obtaining informed consent, the colonoscope                            was passed under direct vision. Throughout the                            procedure, the patient's blood pressure, pulse, and                            oxygen saturations were monitored continuously. The                            Colonoscope was introduced  through the anus and                            advanced to the the cecum, identified by                            appendiceal orifice and ileocecal valve. The                            colonoscopy was technically difficult and complex                            due to colonic spasm. The patient tolerated the                            procedure well. The quality of the bowel                            preparation was fair. The ileocecal valve,                            appendiceal orifice, and rectum were photographed. Scope In: 11:10:44 AM Scope Out: 11:56:26 AM Scope Withdrawal Time: 0 hours 38 minutes 21 seconds  Total Procedure Duration: 0 hours 45 minutes 42 seconds  Findings:                 The perianal and digital rectal examinations were                            normal.                           A diffuse area of suspected congested mucosa was                            found in the cecum without clear borders. Biopsies                            were taken with a cold forceps for histology to  ensure no adenomatous change.                           A 6 mm polyp was found in the hepatic flexure. The                            polyp was sessile. The polyp was removed with a                            cold snare. Resection and retrieval were complete.                           Nine sessile polyps were found in the transverse                            colon. The polyps were 3 to 7 mm in size. These                            polyps were removed with a cold snare. Resection                            and retrieval were complete.                           A 6 mm polyp was found in the descending colon. The                            polyp was sessile. The polyp was removed with a                            cold snare. Resection and retrieval were complete.                           A 8 mm polyp was found in the recto-sigmoid colon.                             The polyp was semi-pedunculated. The polyp was                            removed with a hot snare. Resection and retrieval                            were complete.                           There was significant spasm in the entire colon.                            This, in conjunction with fair prep, significantly                            prolonged the exam. Prep was worst in the right  colon - time spent lavaging the entire colon with                            mostly adequate views.                           The exam was otherwise without abnormality. Due to                            small size of rectum, retroflexed views not                            obtained. Complications:            No immediate complications. Estimated blood loss:                            Minimal. Estimated Blood Loss:     Estimated blood loss was minimal. Impression:               - Preparation of the colon was fair, significant                            time spent lavaging the colon to achieve mostly                            adequate views. Right colon was most affected..                           - Suspected congested mucosa in the cecum. Biopsied                            to rule out flat adenoma.                           - One 6 mm polyp at the hepatic flexure, removed                            with a cold snare. Resected and retrieved.                           - Nine 3 to 7 mm polyps in the transverse colon,                            removed with a cold snare. Resected and retrieved.                           - One 6 mm polyp in the descending colon, removed                            with a cold snare. Resected and retrieved.                           - One 8 mm polyp at the recto-sigmoid colon,  removed with a hot snare. Resected and retrieved.                           - Significant colonic spasm.                           - The  examination was otherwise normal. No                            abnormality in the rectum to correlate with CT                            findings. Recommendation:           - Patient has a contact number available for                            emergencies. The signs and symptoms of potential                            delayed complications were discussed with the                            patient. Return to normal activities tomorrow.                            Written discharge instructions were provided to the                            patient.                           - Resume previous diet.                           - Continue present medications.                           - Await pathology results. Remo Lipps P. Cherly Erno, MD 04/25/2019 [id number] PM This report has been signed electronically.

## 2019-04-25 NOTE — Progress Notes (Signed)
A/ox3, pleased with MAC, report to RN 

## 2019-04-26 ENCOUNTER — Encounter: Payer: Self-pay | Admitting: Family Medicine

## 2019-04-26 ENCOUNTER — Other Ambulatory Visit: Payer: Self-pay

## 2019-04-26 MED ORDER — PROPRANOLOL HCL 10 MG PO TABS: 10.0000 mg | ORAL_TABLET | Freq: Two times a day (BID) | ORAL | 3 refills | Status: DC

## 2019-04-26 NOTE — Telephone Encounter (Signed)
Spoke with pt confirming change in pharmacy.  Pt states she's changed her mind and wants to continue with [facility].  Fwd to Dr. Darnell Level to address rest of message.

## 2019-04-29 ENCOUNTER — Telehealth: Payer: Self-pay | Admitting: *Deleted

## 2019-04-29 MED ORDER — OMEPRAZOLE 40 MG PO CPDR: 40.0000 mg | DELAYED_RELEASE_CAPSULE | Freq: Two times a day (BID) | ORAL | 11 refills | Status: DC

## 2019-04-29 NOTE — Addendum Note (Signed)
Addended by: [name] on: 04/29/2019 05:58 PM   Modules accepted: Orders

## 2019-04-29 NOTE — Telephone Encounter (Signed)
  Follow up Call-  Call back number 04/25/2019  Post procedure Call Back phone  # [phone]  Permission to leave phone message Yes  Some recent data might be hidden     Patient questions:  Message left to call us if necessary.

## 2019-04-29 NOTE — Telephone Encounter (Signed)
No answer for second post procedure follow up call left message for patient to call back with questions or concerns.

## 2019-05-05 ENCOUNTER — Encounter: Payer: Self-pay | Admitting: Family Medicine

## 2019-05-13 ENCOUNTER — Ambulatory Visit (INDEPENDENT_AMBULATORY_CARE_PROVIDER_SITE_OTHER): Payer: Self-pay | Admitting: Family Medicine

## 2019-05-13 ENCOUNTER — Encounter: Payer: Self-pay | Admitting: Family Medicine

## 2019-05-13 VITALS — Ht 61.0 in | Wt 189.2 lb

## 2019-05-13 DIAGNOSIS — Z794 Long term (current) use of insulin: Secondary | ICD-10-CM

## 2019-05-13 DIAGNOSIS — K7469 Other cirrhosis of liver: Secondary | ICD-10-CM

## 2019-05-13 DIAGNOSIS — E119 Type 2 diabetes mellitus without complications: Secondary | ICD-10-CM

## 2019-05-13 DIAGNOSIS — I10 Essential (primary) hypertension: Secondary | ICD-10-CM

## 2019-05-13 DIAGNOSIS — K219 Gastro-esophageal reflux disease without esophagitis: Secondary | ICD-10-CM

## 2019-05-13 NOTE — Assessment & Plan Note (Signed)
Appreciate GI care. Reviewed recent EGD findings with patient. Now on propranolol 10mg  bid and omeprazole 40mg  BID.

## 2019-05-13 NOTE — Progress Notes (Signed)
Virtual visit completed through [employer]. Due to national recommendations of social distancing due to COVID-19, a virtual visit is felt to be most appropriate for this patient at this time. Reviewed limitations of a virtual visit.   Patient location: home Provider location: [place] at [facility], office If any vitals were documented, they were collected by patient at home unless specified below.    Ht 5\' 1"  (1.549 m)   Wt 189 lb 3 oz (85.8 kg)   BMI 35.75 kg/m    CC: discuss meds Subjective:    Patient ID: Karen [name], female    DOB: 07-Jun-1955, 63 y.o.   MRN: [id number]  HPI: Karen [name] is a 63 y.o. female presenting on 05/13/2019 for Discuss Medication (Wants to discuss propranolol. )   Recent EGD for cirrhosis finding esophageal varices with portal hypertensive gastritis, started on propranolol 10mg  bid and omeprazole 40mg  was increased to twice daily. She also continues her losartan 25mg  daily and lasix 20mg  PRN (not recently taking). Denies low BP symptoms of dizziness or lightheadedness. She will start monitoring BP more closely - will try to get new BP cuff this week.   Notes weight gain without leg swelling. Some exertional dyspnea and cough.   Continues lantus bid and novolin R with meals.      Relevant past medical, surgical, family and social history reviewed and updated as indicated. Interim medical history since our last visit reviewed. Allergies and medications reviewed and updated. Outpatient Medications Prior to Visit  Medication Sig Dispense Refill  . acetaminophen (TYLENOL) 500 MG tablet Take 1,000 mg by mouth 3 (three) times daily as needed for mild pain.    Marland Kitchen aspirin EC 81 MG EC tablet Take 1 tablet (81 mg total) by mouth daily.    Marland Kitchen dicyclomine (BENTYL) 20 MG tablet Take 1 tablet (20 mg total) by mouth 2 (two) times daily as needed for spasms. 45 tablet 3  . furosemide (LASIX) 20 MG tablet Take 1 tablet (20 mg total) by mouth daily as needed for edema.  30 tablet 3  . gabapentin (NEURONTIN) 300 MG capsule Take 1 capsule (300 mg total) by mouth at bedtime. 90 capsule 3  . Insulin Glargine (LANTUS SOLOSTAR) 100 UNIT/ML Solostar Pen Inject 60 units in the morning and 65 units in the evenings. QS 3 mo 15 pen 3  . Insulin Pen Needle (PEN NEEDLES) 31G X 8 MM MISC Use to inject Lantus as directed twice daily. Dx: E11.40 60 each 3  . insulin regular (NOVOLIN R) 100 units/mL injection Inject 0.2 mLs (20 Units total) into the skin 3 (three) times daily before meals. + sliding scale insulin 5u for every 50 over 200 10 mL 6  . KLOR-CON SPRINKLE 10 MEQ CR capsule Take 1 capsule (10 mEq total) by mouth daily as needed. When taking Lasix 30 capsule 3  . losartan (COZAAR) 25 MG tablet Take 1 tablet (25 mg total) by mouth daily. 90 tablet 3  . metFORMIN (GLUCOPHAGE XR) 750 MG 24 hr tablet Take 2 tablets (1,500 mg total) by mouth every evening. 60 tablet 6  . nitroGLYCERIN (NITROSTAT) 0.4 MG SL tablet Place 1 tablet (0.4 mg total) under the tongue every 5 (five) minutes as needed for chest pain. 25 tablet 0  . omeprazole (PRILOSEC) 40 MG capsule Take 1 capsule (40 mg total) by mouth 2 (two) times daily. 60 capsule 11  . polyethylene glycol (MIRALAX / GLYCOLAX) 17 g packet Take 17 g by mouth daily  as needed.    . propranolol (INDERAL) 10 MG tablet Take 1 tablet (10 mg total) by mouth 2 (two) times daily. Titrate up to 20mg  twice a day, as tolerated 90 tablet 3  . rosuvastatin (CRESTOR) 5 MG tablet Take 1 tablet (5 mg total) by mouth daily. 90 tablet 3  . sertraline (ZOLOFT) 50 MG tablet Take 1 tablet (50 mg total) by mouth daily. 30 tablet 11  . traMADol (ULTRAM) 50 MG tablet Take 1 tablet (50 mg total) by mouth daily as needed for moderate pain. 30 tablet 0   Facility-Administered Medications Prior to Visit  Medication Dose Route Frequency Provider Last Rate Last Admin  . 0.9 %  sodium chloride infusion  500 mL Intravenous Once Armbruster, Carlota Raspberry, MD          Per HPI unless specifically indicated in ROS section below Review of Systems Objective:    Ht 5\' 1"  (1.549 m)   Wt 189 lb 3 oz (85.8 kg)   BMI 35.75 kg/m   Wt Readings from Last 3 Encounters:  05/13/19 189 lb 3 oz (85.8 kg)  04/25/19 184 lb (83.5 kg)  04/10/19 182 lb 9 oz (82.8 kg)     Physical exam: Gen: alert, NAD, not ill appearing Pulm: speaks in complete sentences without increased work of breathing Psych: normal mood, normal thought content      Results for orders placed or performed in visit on 04/23/19  SARS Coronavirus 2 (TAT 6-24 hrs)  Result Value Ref Range   SARS Coronavirus 2 RESULT:  NEGATIVE    Assessment & Plan:   Problem List Items Addressed This Visit    Insulin dependent type 2 diabetes mellitus ([place])    Consider toujeo given high dose lantus. May apply for financial assistance (same company as her lantus - [employer]). I did ask for some updated cbg readings.       HTN (hypertension) - Primary    Seems to be tolerating additional low dose propranolol. Working on getting BP cuff for home use - will hold propranolol titration until she has cuff to monitor BP closely at home. Reviewed optimal procedure to get reliable BP readings at home.       GERD (gastroesophageal reflux disease)    Continue omeprazole 40mg  bid (in setting of portal hypertensive gastritis).       Cirrhosis of liver ([place])    Appreciate GI care. Reviewed recent EGD findings with patient. Now on propranolol 10mg  bid and omeprazole 40mg  BID.           No orders of the defined types were placed in this encounter.  No orders of the defined types were placed in this encounter.   I discussed the assessment and treatment plan with the patient. The patient was provided an opportunity to ask questions and all were answered. The patient agreed with the plan and demonstrated an understanding of the instructions. The patient was advised to call back or seek an in-person evaluation if the  symptoms worsen or if the condition fails to improve as anticipated.  Follow up plan: No follow-ups on file.  [name], MD

## 2019-05-13 NOTE — Assessment & Plan Note (Signed)
Seems to be tolerating additional low dose propranolol. Working on getting BP cuff for home use - will hold propranolol titration until she has cuff to monitor BP closely at home. Reviewed optimal procedure to get reliable BP readings at home.

## 2019-05-13 NOTE — Assessment & Plan Note (Signed)
Continue omeprazole 40mg  bid (in setting of portal hypertensive gastritis).

## 2019-05-13 NOTE — Assessment & Plan Note (Addendum)
Consider toujeo given high dose lantus. May apply for financial assistance (same company as her lantus - [employer]). I did ask for some updated cbg readings.

## 2019-05-15 ENCOUNTER — Other Ambulatory Visit: Payer: Self-pay

## 2019-05-15 NOTE — Telephone Encounter (Signed)
Signed.

## 2019-05-15 NOTE — Telephone Encounter (Signed)
Received faxed refill request form from [employer] for Lantus pen.  Placed form in Dr. Synthia Innocent box.

## 2019-05-16 ENCOUNTER — Telehealth: Payer: Self-pay

## 2019-05-16 MED ORDER — SERTRALINE HCL 50 MG PO TABS: 50.0000 mg | ORAL_TABLET | Freq: Every day | ORAL | 4 refills | Status: DC

## 2019-05-16 NOTE — Telephone Encounter (Signed)
E-scribed refill 

## 2019-05-16 NOTE — Telephone Encounter (Signed)
Angel with [facility] left v/m requesting new rx for zoloft/ pt will no longer go to [facility].

## 2019-05-21 ENCOUNTER — Telehealth: Payer: Self-pay | Admitting: Family Medicine

## 2019-05-21 NOTE — Telephone Encounter (Signed)
Lantus shipment received from [facility] Patient Assistance.  Called and left a detailed message on patient verified voicemail making her aware.  mychart message sent as well.    Nothing further needed.

## 2019-05-22 ENCOUNTER — Encounter: Payer: Self-pay | Admitting: Family Medicine

## 2019-05-22 DIAGNOSIS — I471 Supraventricular tachycardia: Secondary | ICD-10-CM

## 2019-05-22 DIAGNOSIS — Z9861 Coronary angioplasty status: Secondary | ICD-10-CM

## 2019-05-22 DIAGNOSIS — I2510 Atherosclerotic heart disease of native coronary artery without angina pectoris: Secondary | ICD-10-CM

## 2019-05-27 ENCOUNTER — Encounter: Payer: Self-pay | Admitting: Family Medicine

## 2019-06-08 ENCOUNTER — Encounter: Payer: Self-pay | Admitting: Family Medicine

## 2019-06-10 MED ORDER — LOSARTAN POTASSIUM 25 MG PO TABS: 25.0000 mg | ORAL_TABLET | Freq: Every day | ORAL | 0 refills | Status: DC

## 2019-06-10 NOTE — Telephone Encounter (Signed)
E-scribed refill.  Notified pt via MyChart.  ?

## 2019-06-20 ENCOUNTER — Encounter: Payer: Self-pay | Admitting: Family Medicine

## 2019-06-21 MED ORDER — FUROSEMIDE 20 MG PO TABS: 20.0000 mg | ORAL_TABLET | Freq: Every day | ORAL | 3 refills | Status: DC | PRN

## 2019-06-21 NOTE — Telephone Encounter (Signed)
Name of Medication: Tramadol Name of Pharmacy: [facility] or Written Date and Quantity: 02/25/19, #30 Last Office Visit and Type: 05/13/19, discuss meds Next Office Visit and Type: 07/11/19, 3 mo f/u Last Controlled Substance Agreement Date: none Last UDS: none

## 2019-06-23 MED ORDER — TRAMADOL HCL 50 MG PO TABS: 50.0000 mg | ORAL_TABLET | Freq: Every day | ORAL | 0 refills | Status: DC | PRN

## 2019-06-24 ENCOUNTER — Encounter: Payer: Self-pay | Admitting: Family Medicine

## 2019-06-29 ENCOUNTER — Other Ambulatory Visit: Payer: Self-pay | Admitting: Family Medicine

## 2019-07-06 ENCOUNTER — Telehealth: Payer: Self-pay | Admitting: Family Medicine

## 2019-07-06 NOTE — Telephone Encounter (Signed)
Received a call from [place] that pt accidentally took twice her normal metformin dose  Metformin xr 750 - she took 4 tablets instead of the prescribed 2 = 3000 mg total Normal renal function in June  I called Ebony Hail at the Poison control center-this dose is unlikely to cause any major issues.  Hypoglycemia should not be a concern.  I called patient back and gave her this update.  She is overall feeling fine.  She notes that her sugar is actually 390 right now, she plans to take her sliding scale insulin.  I advised her this is fine, please decrease typical dose by 5 units given high dose of Metformin.  Tomorrow she will take just 1 Metformin tablet instead of her usual 2, and then get back to normal the following day  Answered all questions for patient, she states appreciation

## 2019-07-08 ENCOUNTER — Telehealth: Payer: Self-pay

## 2019-07-08 NOTE — Telephone Encounter (Signed)
This was taken care of by Dr. Einar Pheasant on call. Pt was contacted by provider.

## 2019-07-08 NOTE — Telephone Encounter (Signed)
[name] - Client TELEPHONE ADVICE RECORD [facility] Patient Name: Karen [name] Gender: Female DOB: [birth year] Age: 64 Y 10 M 9 D Return Phone Number: [phone] (Primary) Address: City/State/Zip: [place] [address] Client [facility] Night - Client Client Site [place] Physician [name] - MD Contact Type Call Who Is Calling Patient / Member / Family / Caregiver Call Type Triage / Clinical Relationship To Patient Self Return Phone Number [phone] (Primary) Chief Complaint OVERDOSE took too much medication at once Reason for Call Symptomatic / Request for [place] took a double dose of metformin extended release. Translation No Nurse Assessment Nurse: Enid Derry, RN, Manuela Schwartz Date/Time Eilene Ghazi Time): 07/06/2019 7:03:34 PM Confirm and document reason for call. If symptomatic, describe symptoms. ---Caller states she accidentally took a double dose of Metformin 750 mg extended release. She normally takes 2 once daily in the evening. Took normal dose around 4 pm, then the accidentally dose just now. No symptoms at present. Has the patient had close contact with a person known or suspected to have the novel coronavirus illness OR traveled / lives in area with major community spread (including international travel) in the last 14 days from the onset of symptoms? * If Asymptomatic, screen for exposure and travel within the last 14 days. ---No Does the patient have any new or worsening symptoms? ---Yes Will a triage be completed? ---Yes Related visit to physician within the last 2 weeks? ---N/A Does the PT have any chronic conditions? (i.e. diabetes, asthma, this includes High risk factors for pregnancy, etc.) ---Yes List chronic conditions. ---diabetes, insulin dependent, heart trouble - stent in coronary artery, previous cardiac ablation for  tachy. Is this a behavioral health or substance abuse call? ---No Guidelines Guideline Title Affirmed Question Affirmed Notes Nurse Date/Time (Eastern Time) Poisoning [1] DOUBLE DOSE (an extra dose or lesser amount) of prescription drug AND [2] NO symptoms (Exception: Enid Derry, RN, Manuela Schwartz [id number] 7:07:10 PM PLEASE NOTE: All timestamps contained within this report are represented as [place] Standard Time. CONFIDENTIALTY NOTICE: This fax transmission is intended only for the addressee. It contains information that is legally privileged, confidential or otherwise protected from use or disclosure. If you are not the intended recipient, you are strictly prohibited from reviewing, disclosing, copying using or disseminating any of this information or taking any action in reliance on or regarding this information. If you have received this fax in error, please notify us immediately by telephone so that we can arrange for its return to [place]. Phone: [phone], Toll-Free: [phone], Fax: [phone] Page: 2 of 2 Call Id: [id number] Guidelines Guideline Title Affirmed Question Affirmed Notes Nurse Date/Time Eilene Ghazi Time) a double dose of antibiotics) Disp. Time Eilene Ghazi Time) Disposition Final User 07/06/2019 7:02:21 PM Send to Urgent Delrae Rend 07/06/2019 7:13:03 PM Called On-Call Provider [place], RN, Manuela Schwartz [id number] 7:09:58 PM Call PCP Now Yes Enid Derry, RN, Edwena Bunde Disagree/Comply Comply Caller Understands Yes PreDisposition Call Doctor Care Advice Given Per Guideline CALL PCP NOW: * I'll page the on-call provider now. If you haven't heard from the provider (or me) within 30 minutes, call again. * You can also ask your own pharmacist at the drug store where you got the medicine. * You need to call the [facility] now. CARE ADVICE given per Poisoning (Adult) guideline. * You need to discuss this with your doctor (or NP/PA). Paging DoctorName Phone DateTime Result/Outcome  Message Type Notes Lamar Blinks- MD [id number]  07/06/2019 7:13:03 PM Called On Call Provider - Reached Doctor Paged Lamar Blinks- MD 07/06/2019 7:16:28 PM Spoke with On Call - [place], Dr. Lamar Blinks, stated that she will contact the patient. She will also call poison control to see if there are any special recommendations. She stated the patient should be fine, but will call to check on her once she speaks with poison control. Returned call to patient to advise; she will await the doctor's call and states she feels fine now. Advised her that if she begins to feel bad before hearing from the doctor, to call back right away. She agreed.

## 2019-07-11 ENCOUNTER — Ambulatory Visit: Payer: No Typology Code available for payment source | Admitting: Family Medicine

## 2019-07-17 ENCOUNTER — Ambulatory Visit: Payer: Self-pay | Admitting: Family Medicine

## 2019-07-21 NOTE — Progress Notes (Signed)
Cardiology Office Note:    Date:  07/22/2019   ID:  Karen, [name] May 19, 1955, MRN [id number]  PCP:  [name], MD  Cardiologist:  No primary care provider on file.   Referring MD: [name], MD   Chief Complaint  Patient presents with  . Advice Only    Palpitations    History of Present Illness:    Karen [name] is a 64 y.o. female with a hx of CAD with RCA and RI DES 2015, history of SVT status post ablation in May 2017, diabetes mellitus II, hyperlipidemia, cirrhosis, and essential hypertension.  Creasing shortness of breath since starting propranolol.  Has a difficult time ambulating because of this.  There is no orthopnea, PND, or cough.  She does have lower extremity swelling.  Lower extremity swelling has been more significant since starting propranolol for portal hypertension.  She denies smoking and alcohol use.  She has never had hepatitis.  She has occasional palpitation but nothing similar to PSVT that she has had in the past.  This is somewhat of a nuisance but not a daily occurrence.  Past Medical History:  Diagnosis Date  . Body aches 03/24/2016   Resolved off simvastatin.   Marland Kitchen CAD (coronary artery disease)    STEMI 07/2013 - treated with PTCA of the RCA followed by DES to the intermediate ramus  //  Myoview 5/17 - Normal perfusion. LVEF 71% with normal wall motion. This is a low risk study.  . Chronic bronchitis   . Cirrhosis ([place])   . Depression   . Diabetes ([place])   . GERD (gastroesophageal reflux disease)   . High blood pressure   . History of chicken pox   . History of ovarian cyst   . HLD (hyperlipidemia)   . Seasonal allergies   . Seizure ([place]) 1977   off AEDs  . SVT (supraventricular tachycardia) ([place]) 10/2015   s/p catheter ablation  . Syncopal episodes   . Tachyarrhythmia    ER visits with adenosine push to control  . Tubal pregnancy   . Type 2 diabetes, uncontrolled, with neuropathy ([place])    dx 1993    Past Surgical  History:  Procedure Laterality Date  . ABLATION OF DYSRHYTHMIC FOCUS  11/06/2015   SVT  . CARDIAC CATHETERIZATION  07/2013   angioplasty to RCA and DES to ramus intermedius  . DILATION AND CURETTAGE OF UTERUS  1984  . ECTOPIC PREGNANCY SURGERY Right 1984   tube removal 2/2 hemorrhage  . ELECTROPHYSIOLOGIC STUDY N/A 11/06/2015   SVT Ablation;  Evans Lance, MD;  Location: [facility]  . LEFT HEART CATHETERIZATION WITH CORONARY ANGIOGRAM N/A 07/23/2013   Procedure: LEFT HEART CATHETERIZATION WITH CORONARY ANGIOGRAM;  Surgeon: Sinclair Grooms, MD;  Location: [facility] CATH LAB;  Service: Cardiovascular;  Laterality: N/A;  . OVARIAN CYST SURGERY  1998  . PERCUTANEOUS CORONARY STENT INTERVENTION (PCI-S) N/A 07/24/2013   Procedure: PERCUTANEOUS CORONARY STENT INTERVENTION (PCI-S);  Surgeon: Sinclair Grooms, MD;  Location: [facility] CATH LAB;  Service: Cardiovascular;  Laterality: N/A;  . ROTATOR CUFF REPAIR  2006   Right  . SHOULDER ARTHROSCOPY Left 02/2017   LEFT shoulder arthroscopy with limited debridement, decompression/acromioplasty, open rotator cuff repair and biceps tenodesis ([facility])  . SUBMANDIBULAR DUCT LIGATION Right 10/2014   R submandibular gland stone removed [facility])  . TONSILLECTOMY AND ADENOIDECTOMY  1965    Current Medications: Current Meds  Medication Sig  . acetaminophen (TYLENOL) 500 MG  tablet Take 1,000 mg by mouth 3 (three) times daily as needed for mild pain.  Marland Kitchen aspirin EC 81 MG EC tablet Take 1 tablet (81 mg total) by mouth daily.  Marland Kitchen dicyclomine (BENTYL) 20 MG tablet Take 1 tablet (20 mg total) by mouth 2 (two) times daily as needed for spasms.  . furosemide (LASIX) 20 MG tablet Take 1 tablet (20 mg total) by mouth daily as needed for edema.  . gabapentin (NEURONTIN) 300 MG capsule Take 1 capsule (300 mg total) by mouth at bedtime.  . Insulin Glargine (LANTUS SOLOSTAR) 100 UNIT/ML Solostar Pen Inject 60 units in the morning and 65 units in the evenings. QS 3 mo  .  Insulin Pen Needle (PEN NEEDLES) 31G X 8 MM MISC Use to inject Lantus as directed twice daily. Dx: E11.40  . insulin regular (NOVOLIN R) 100 units/mL injection Inject 0.2 mLs (20 Units total) into the skin 3 (three) times daily before meals. + sliding scale insulin 5u for every 50 over 200  . KLOR-CON SPRINKLE 10 MEQ CR capsule Take 1 capsule (10 mEq total) by mouth daily as needed. When taking Lasix  . losartan (COZAAR) 25 MG tablet Take 1 tablet (25 mg total) by mouth daily.  . metFORMIN (GLUCOPHAGE XR) 750 MG 24 hr tablet Take 2 tablets (1,500 mg total) by mouth every evening.  . nitroGLYCERIN (NITROSTAT) 0.4 MG SL tablet Place 1 tablet (0.4 mg total) under the tongue every 5 (five) minutes as needed for chest pain.  Marland Kitchen omeprazole (PRILOSEC) 40 MG capsule Take 1 capsule (40 mg total) by mouth 2 (two) times daily.  . polyethylene glycol (MIRALAX / GLYCOLAX) 17 g packet Take 17 g by mouth daily as needed.  . propranolol (INDERAL) 10 MG tablet Take 20 mg by mouth 2 (two) times daily.  . rosuvastatin (CRESTOR) 5 MG tablet TAKE ONE TABLET BY MOUTH DAILY  . sertraline (ZOLOFT) 50 MG tablet Take 1 tablet (50 mg total) by mouth daily.  . traMADol (ULTRAM) 50 MG tablet Take 1 tablet (50 mg total) by mouth daily as needed for moderate pain.   Current Facility-Administered Medications for the 07/22/19 encounter (Office Visit) with Belva Crome, MD  Medication  . 0.9 %  sodium chloride infusion     Allergies:   Clindamycin/lincomycin, Erythromycin, Lipitor [atorvastatin], and Penicillins   Social History   Socioeconomic History  . Marital status: Widowed    Spouse name: Not on file  . Number of children: 0  . Years of education: 12th grade  . Highest education level: Not on file  Occupational History  . Occupation: ALF, [employer], med tech/PCA    Employer: [employer]  Tobacco Use  . Smoking status: Never Smoker  . Smokeless tobacco: Never Used  Substance and Sexual Activity  .  Alcohol use: No  . Drug use: No  . Sexual activity: Not Currently  Other Topics Concern  . Not on file  Social History Narrative   Caffeine: 6 diet sodas/day   Lives with 2 dogs.   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 Difficulty of Paying Living Expenses: Not on file  Food Insecurity:   . Worried About Charity fundraiser in the Last Year: Not on file  . Ran Out of Food in the Last Year: Not on file  Transportation Needs:  No Transportation Needs  . Lack of Transportation (Medical): No  . Lack of Transportation (Non-Medical): No  Physical Activity:   . Days of Exercise per Week: Not on file  . Minutes of Exercise per Session: Not on file  Stress:   . Feeling of Stress : Not on file  Social Connections:   . Frequency of Communication with Friends and Family: Not on file  . Frequency of Social Gatherings with Friends and Family: Not on file  . Attends Religious Services: Not on file  . Active Member of Clubs or Organizations: Not on file  . Attends Archivist Meetings: Not on file  . Marital Status: Not on file     Family History: The patient's family history includes Arrhythmia in her brother; Breast cancer in her paternal aunt; Cirrhosis in her maternal grandmother; Congestive Heart Failure in her father; Coronary artery disease in her father and mother; Diabetes in her brother, brother, father, maternal grandfather, maternal grandmother, mother, paternal grandfather, paternal grandmother, and sister; Heart disease in her brother and mother; Hyperlipidemia in her father and mother; Hypertension in her father and mother; Pancreatic cancer in her father; Stroke in her mother. There is no history of Kidney disease.  ROS:   Please see the history of present illness.    Abdominal swelling is been noted.  She denies wheezing.  She denies  cough.  She has not had syncope.  She denies angina.  She is filing a disability claim.  She is diabetic and all other systems reviewed and are negative.  EKGs/Labs/Other Studies Reviewed:    The following studies were reviewed today: No recent cardiac evaluation  EKG:  EKG sinus rhythm, nonspecific T wave flattening, and no prior tracing is available.  Recent Labs: 11/02/2018: ALT 44; BUN 16; Creatinine, Ser 0.68; Hemoglobin 14.1; Platelets 126; Potassium 4.6; Sodium 135  Recent Lipid Panel    Component Value Date/Time   CHOL 181 04/10/2019 1254   TRIG 206.0 (H) 04/10/2019 1254   HDL 39.60 04/10/2019 1254   CHOLHDL 5 04/10/2019 1254   VLDL 41.2 (H) 04/10/2019 1254   LDLCALC UNABLE TO CALCULATE IF TRIGLYCERIDE OVER 400 mg/dL 07/24/2013 0020   LDLDIRECT 89.0 04/10/2019 1254    Physical Exam:    VS:  BP 128/70   Pulse 67   Ht 5\' 1"  (1.549 m)   Wt 190 lb (86.2 kg)   SpO2 96%   BMI 35.90 kg/m     Wt Readings from Last 3 Encounters:  07/22/19 190 lb (86.2 kg)  05/13/19 189 lb 3 oz (85.8 kg)  04/25/19 184 lb (83.5 kg)     GEN: Abdominal obesity. No acute distress HEENT: Normal NECK: No JVD. LYMPHATICS: No lymphadenopathy CARDIAC:  RRR without murmur, gallop, but there is 2+ bilateral lower extremity edema. VASCULAR:  Normal Pulses. No bruits. RESPIRATORY:  Clear to auscultation without rales, wheezing or rhonchi  ABDOMEN: Soft, non-tender, non-distended, No pulsatile mass, MUSCULOSKELETAL: No deformity  SKIN: Warm and dry NEUROLOGIC:  Alert and oriented x 3 PSYCHIATRIC:  Normal affect   ASSESSMENT:    1. Coronary artery disease involving native coronary artery of native heart without angina pectoris   2. SVT (supraventricular tachycardia) ([place])   3. Essential hypertension   4. Dyslipidemia associated with type 2 diabetes mellitus ([place])   5. Insulin dependent type 2 diabetes mellitus ([place])   6. Cirrhosis of liver without ascites, unspecified hepatic cirrhosis type  ([place])   7. Educated about COVID-19 virus infection  8. SOB (shortness of breath)    PLAN:    In order of problems listed above:  1. No symptoms to suggest angina.  Secondary prevention is discussed.  I guess there is a possibility that dyspnea on exertion could be an anginal equivalent. 2. No current symptoms to suggest PSVT. 3. BP is well controlled on current therapy. 4. Most recent LDL cholesterol was not calculated because of extremely elevated triglyceride.  LDL target should be less than 70 given diabetes. 5. Target A1c less than 7.  This will be checked today. 6. She does not know the etiology of cirrhosis.  She denies alcohol.  Short of breath since starting propranolol.  I am unable to ascertain if there is ascites on exam.  Dr. Havery Moros is GI. 7. 3W's is being practiced. 8. 2D Doppler echocardiogram to assess right ventricular function and diastolic parameters.  Rule out pericardial effusion.  BNP will also be obtained along with a TSH.  Overall goal may be to decrease volume by adding diuretic therapy such as spironolactone given cirrhosis.  We will get additional information as noted above that will include BNP, comprehensive metabolic panel, CBC, TSH, and hemoglobin A1c     Medication Adjustments/Labs and Tests Ordered: Current medicines are reviewed at length with the patient today.  Concerns regarding medicines are outlined above.  Orders Placed This Encounter  Procedures  . Pro b natriuretic peptide  . Basic metabolic panel  . Hepatic function panel  . TSH  . CBC  . HgB A1c  . EKG 12-Lead  . ECHOCARDIOGRAM COMPLETE   No orders of the defined types were placed in this encounter.   Patient Instructions  Medication Instructions:  Your physician recommends that you continue on your current medications as directed. Please refer to the Current Medication list given to you today.  *If you need a refill on your cardiac medications before your next appointment,  please call your pharmacy*   Lab Work: BMET, Liver, Pro BNP, A1C, TSH and CBC today  If you have labs (blood work) drawn today and your tests are completely normal, you will receive your results only by: Marland Kitchen MyChart Message (if you have MyChart) OR . A paper copy in the mail If you have any lab test that is abnormal or we need to change your treatment, we will call you to review the results.   Testing/Procedures: Your physician has requested that you have an echocardiogram. Echocardiography is a painless test that uses sound waves to create images of your heart. It provides your doctor with information about the size and shape of your heart and how well your heart's chambers and valves are working. This procedure takes approximately one hour. There are no restrictions for this procedure.    Follow-Up: At [facility], you and your health needs are our priority.  As part of our continuing mission to provide you with exceptional heart care, we have created designated Provider Care Teams.  These Care Teams include your primary Cardiologist (physician) and Advanced Practice Providers (APPs -  Physician Assistants and Nurse Practitioners) who all work together to provide you with the care you need, when you need it.  We recommend signing up for the patient portal called "MyChart".  Sign up information is provided on this After Visit Summary.  MyChart is used to connect with patients for Virtual Visits (Telemedicine).  Patients are able to view lab/test results, encounter notes, upcoming appointments, etc.  Non-urgent messages can be sent to your provider  as well.   To learn more about what you can do with MyChart, go to [email or web address].    Your next appointment:   3-6 month(s) depending on results and tests  The format for your next appointment:   In Person  Provider:   You may see Dr. Daneen Schick or one of the following Advanced Practice Providers on your designated Care Team:     Truitt Merle, NP  Cecilie Kicks, NP  Kathyrn Drown, NP    Other Instructions      Signed, Sinclair Grooms, MD  07/22/2019 10:22 AM    [place]

## 2019-07-22 ENCOUNTER — Ambulatory Visit (INDEPENDENT_AMBULATORY_CARE_PROVIDER_SITE_OTHER): Payer: Self-pay | Admitting: Interventional Cardiology

## 2019-07-22 ENCOUNTER — Encounter: Payer: Self-pay | Admitting: Interventional Cardiology

## 2019-07-22 ENCOUNTER — Other Ambulatory Visit: Payer: Self-pay

## 2019-07-22 VITALS — BP 128/70 | HR 67 | Ht 61.0 in | Wt 190.0 lb

## 2019-07-22 DIAGNOSIS — R0602 Shortness of breath: Secondary | ICD-10-CM

## 2019-07-22 DIAGNOSIS — Z7189 Other specified counseling: Secondary | ICD-10-CM

## 2019-07-22 DIAGNOSIS — E785 Hyperlipidemia, unspecified: Secondary | ICD-10-CM

## 2019-07-22 DIAGNOSIS — E119 Type 2 diabetes mellitus without complications: Secondary | ICD-10-CM

## 2019-07-22 DIAGNOSIS — Z794 Long term (current) use of insulin: Secondary | ICD-10-CM

## 2019-07-22 DIAGNOSIS — I10 Essential (primary) hypertension: Secondary | ICD-10-CM

## 2019-07-22 DIAGNOSIS — I2510 Atherosclerotic heart disease of native coronary artery without angina pectoris: Secondary | ICD-10-CM

## 2019-07-22 DIAGNOSIS — E1169 Type 2 diabetes mellitus with other specified complication: Secondary | ICD-10-CM

## 2019-07-22 DIAGNOSIS — I471 Supraventricular tachycardia: Secondary | ICD-10-CM

## 2019-07-22 DIAGNOSIS — K7460 Unspecified cirrhosis of liver: Secondary | ICD-10-CM

## 2019-07-22 NOTE — Patient Instructions (Signed)
Medication Instructions:  Your physician recommends that you continue on your current medications as directed. Please refer to the Current Medication list given to you today.  *If you need a refill on your cardiac medications before your next appointment, please call your pharmacy*   Lab Work: BMET, Liver, Pro BNP, A1C, TSH and CBC today  If you have labs (blood work) drawn today and your tests are completely normal, you will receive your results only by: Marland Kitchen MyChart Message (if you have MyChart) OR . A paper copy in the mail If you have any lab test that is abnormal or we need to change your treatment, we will call you to review the results.   Testing/Procedures: Your physician has requested that you have an echocardiogram. Echocardiography is a painless test that uses sound waves to create images of your heart. It provides your doctor with information about the size and shape of your heart and how well your heart's chambers and valves are working. This procedure takes approximately one hour. There are no restrictions for this procedure.    Follow-Up: At [facility], you and your health needs are our priority.  As part of our continuing mission to provide you with exceptional heart care, we have created designated Provider Care Teams.  These Care Teams include your primary Cardiologist (physician) and Advanced Practice Providers (APPs -  Physician Assistants and Nurse Practitioners) who all work together to provide you with the care you need, when you need it.  We recommend signing up for the patient portal called "MyChart".  Sign up information is provided on this After Visit Summary.  MyChart is used to connect with patients for Virtual Visits (Telemedicine).  Patients are able to view lab/test results, encounter notes, upcoming appointments, etc.  Non-urgent messages can be sent to your provider as well.   To learn more about what you can do with MyChart, go to [email or web address].     Your next appointment:   3-6 month(s) depending on results and tests  The format for your next appointment:   In Person  Provider:   You may see Dr. Daneen Schick or one of the following Advanced Practice Providers on your designated Care Team:    Truitt Merle, NP  Cecilie Kicks, NP  Kathyrn Drown, NP    Other Instructions

## 2019-07-23 LAB — HEPATIC FUNCTION PANEL
ALT: 29 IU/L (ref 0–32)
AST: 38 IU/L (ref 0–40)
Albumin: 4.1 g/dL (ref 3.8–4.8)
Alkaline Phosphatase: 78 IU/L (ref 39–117)
Bilirubin Total: 0.3 mg/dL (ref 0.0–1.2)
Bilirubin, Direct: 0.13 mg/dL (ref 0.00–0.40)
Total Protein: 6.6 g/dL (ref 6.0–8.5)

## 2019-07-23 LAB — BASIC METABOLIC PANEL
BUN/Creatinine Ratio: 21 (ref 12–28)
BUN: 14 mg/dL (ref 8–27)
CO2: 23 mmol/L (ref 20–29)
Calcium: 9.1 mg/dL (ref 8.7–10.3)
Chloride: 101 mmol/L (ref 96–106)
Creatinine, Ser: 0.67 mg/dL (ref 0.57–1.00)
GFR calc Af Amer: 110 mL/min/{1.73_m2} (ref >59)
GFR calc non Af Amer: 95 mL/min/{1.73_m2} (ref >59)
Glucose: 175 mg/dL — ABNORMAL HIGH (ref 65–99)
Potassium: 4.1 mmol/L (ref 3.5–5.2)
Sodium: 139 mmol/L (ref 134–144)

## 2019-07-23 LAB — PRO B NATRIURETIC PEPTIDE: NT-Pro BNP: 147 pg/mL (ref 0–287)

## 2019-07-23 LAB — HEMOGLOBIN A1C
Est. average glucose Bld gHb Est-mCnc: 209 mg/dL
Hgb A1c MFr Bld: 8.9 % — ABNORMAL HIGH (ref 4.8–5.6)

## 2019-07-23 LAB — CBC
Hematocrit: 41.4 % (ref 34.0–46.6)
Hemoglobin: 13.3 g/dL (ref 11.1–15.9)
MCH: 27.7 pg (ref 26.6–33.0)
MCHC: 32.1 g/dL (ref 31.5–35.7)
MCV: 86 fL (ref 79–97)
Platelets: 124 10*3/uL — ABNORMAL LOW (ref 150–450)
RBC: 4.81 x10E6/uL (ref 3.77–5.28)
RDW: 14.2 % (ref 11.7–15.4)
WBC: 5 10*3/uL (ref 3.4–10.8)

## 2019-07-23 LAB — TSH: TSH: 2.01 u[IU]/mL (ref 0.450–4.500)

## 2019-07-24 ENCOUNTER — Telehealth: Payer: Self-pay | Admitting: *Deleted

## 2019-07-24 DIAGNOSIS — I10 Essential (primary) hypertension: Secondary | ICD-10-CM

## 2019-07-24 MED ORDER — SPIRONOLACTONE 25 MG PO TABS: 12.5000 mg | ORAL_TABLET | Freq: Every day | ORAL | 1 refills | Status: DC

## 2019-07-24 NOTE — Telephone Encounter (Signed)
Spoke with pt and went over results and recommendations per Dr. Tamala Julian.  Pt will come for labs on 3/19.  Pt verbalized understanding and was in agreement with plan.

## 2019-07-24 NOTE — Telephone Encounter (Signed)
-----   Message from Belva Crome, MD sent at 07/24/2019  1:06 PM EST ----- Let the patient know labs look okay.  Try Aldactone/spironolactone 12.5 mg/day.  Basic metabolic panel should be performed 7 to 10 days later.  Whether or not the medication will be continued will depend upon whether shortness of breath is improved. A copy will be sent to [name], MD

## 2019-07-25 ENCOUNTER — Ambulatory Visit (INDEPENDENT_AMBULATORY_CARE_PROVIDER_SITE_OTHER): Payer: Self-pay | Admitting: Family Medicine

## 2019-07-25 ENCOUNTER — Encounter: Payer: Self-pay | Admitting: Family Medicine

## 2019-07-25 ENCOUNTER — Other Ambulatory Visit: Payer: Self-pay

## 2019-07-25 VITALS — BP 120/62 | HR 63 | Ht 61.0 in | Wt 191.2 lb

## 2019-07-25 DIAGNOSIS — E113399 Type 2 diabetes mellitus with moderate nonproliferative diabetic retinopathy without macular edema, unspecified eye: Secondary | ICD-10-CM

## 2019-07-25 DIAGNOSIS — Z794 Long term (current) use of insulin: Secondary | ICD-10-CM

## 2019-07-25 DIAGNOSIS — K7469 Other cirrhosis of liver: Secondary | ICD-10-CM

## 2019-07-25 DIAGNOSIS — E119 Type 2 diabetes mellitus without complications: Secondary | ICD-10-CM

## 2019-07-25 DIAGNOSIS — K219 Gastro-esophageal reflux disease without esophagitis: Secondary | ICD-10-CM

## 2019-07-25 DIAGNOSIS — I10 Essential (primary) hypertension: Secondary | ICD-10-CM

## 2019-07-25 MED ORDER — PROPRANOLOL HCL 20 MG PO TABS: 20.0000 mg | ORAL_TABLET | Freq: Two times a day (BID) | ORAL | 3 refills | Status: DC

## 2019-07-25 NOTE — Patient Instructions (Signed)
We will work on new Patent examiner. No changes for now but will see if ozempic is approved.  Return in 3 months for follow up visit, bring sugar log to that visit.

## 2019-07-25 NOTE — Progress Notes (Addendum)
This visit was conducted in person.  BP 120/62 (BP Location: Left Arm, Patient Position: Sitting, Cuff Size: Normal)   Pulse 63   Ht 5\' 1"  (1.549 m)   Wt 191 lb 3 oz (86.7 kg)   SpO2 96%   BMI 36.12 kg/m    CC: 3 mo f/u visit Subjective:    Patient ID: Karen [name], female    DOB: [birth year], 64 y.o.   MRN: [id number]  HPI: Karen [name] is a 64 y.o. female presenting on 07/25/2019 for Follow-up (Here for 3 mo f/u.) and Discuss Medication (Wants to discuss taking Klor-Con and Lasix while taking spironolactone. )   Currently applying for ss disability, has Chief Executive Officer.   HTN - stable on current regimen of lasix 20mg  PRN, losartan 25mg  daily, spironolactone 12.5mg  (recently started) and propranolol.  DM - does sporadically regularly check sugars, fasting this morning 259. Compliant with antihyperglycemic regimen which includes: metformin XR 750mg  2 daily, lantus 60u in am and 65u in evening and novolin R 20u TID with meals + SSI. Denies low sugars or hypoglycemic symptoms. Denies paresthesias. Last diabetic eye exam DUE. Pneumovax: 05/2013. Prevnar: not due. Glucometer brand: Auvon. DSME: declines. Interested in ozempic. Interested transition to levemir which is same Doctor, hospital as ozempic.  Lab Results  Component Value Date   HGBA1C 8.9 (H) 07/22/2019   Diabetic Foot Exam - Simple   Simple Foot Form Diabetic Foot exam was performed with the following findings: Yes 07/25/2019 12:59 PM  Visual Inspection See comments: Yes Sensation Testing See comments: Yes Pulse Check See comments: Yes Comments Diminished pulses 1+ DP bilaterally Sensation diminished to monofilament testing R 2nd distal toe with healing ulcer where she accidentally cut toe when cutting nails.  R heel sole with thick callus overlying scab    Lab Results  Component Value Date   MICROALBUR 3.3 (H) 07/02/2015   Notes increasing stiffness to fingers.     Relevant past medical, surgical, family and social  history reviewed and updated as indicated. Interim medical history since our last visit reviewed. Allergies and medications reviewed and updated. Outpatient Medications Prior to Visit  Medication Sig Dispense Refill  . acetaminophen (TYLENOL) 500 MG tablet Take 1,000 mg by mouth 3 (three) times daily as needed for mild pain.    Marland Kitchen aspirin EC 81 MG EC tablet Take 1 tablet (81 mg total) by mouth daily.    Marland Kitchen dicyclomine (BENTYL) 20 MG tablet Take 1 tablet (20 mg total) by mouth 2 (two) times daily as needed for spasms. 45 tablet 3  . furosemide (LASIX) 20 MG tablet Take 1 tablet (20 mg total) by mouth daily as needed for edema. 30 tablet 3  . gabapentin (NEURONTIN) 300 MG capsule Take 1 capsule (300 mg total) by mouth at bedtime. 90 capsule 3  . Insulin Glargine (LANTUS SOLOSTAR) 100 UNIT/ML Solostar Pen Inject 60 units in the morning and 65 units in the evenings. QS 3 mo 15 pen 3  . Insulin Pen Needle (PEN NEEDLES) 31G X 8 MM MISC Use to inject Lantus as directed twice daily. Dx: E11.40 60 each 3  . insulin regular (NOVOLIN R) 100 units/mL injection Inject 0.2 mLs (20 Units total) into the skin 3 (three) times daily before meals. + sliding scale insulin 5u for every 50 over 200 10 mL 6  . losartan (COZAAR) 25 MG tablet Take 1 tablet (25 mg total) by mouth daily. 90 tablet 0  . metFORMIN (GLUCOPHAGE XR) 750 MG 24 hr  tablet Take 2 tablets (1,500 mg total) by mouth every evening. 60 tablet 6  . nitroGLYCERIN (NITROSTAT) 0.4 MG SL tablet Place 1 tablet (0.4 mg total) under the tongue every 5 (five) minutes as needed for chest pain. 25 tablet 0  . omeprazole (PRILOSEC) 40 MG capsule Take 1 capsule (40 mg total) by mouth 2 (two) times daily. 60 capsule 11  . polyethylene glycol (MIRALAX / GLYCOLAX) 17 g packet Take 17 g by mouth daily as needed.    . rosuvastatin (CRESTOR) 5 MG tablet TAKE ONE TABLET BY MOUTH DAILY 30 tablet 1  . sertraline (ZOLOFT) 50 MG tablet Take 1 tablet (50 mg total) by mouth daily.  30 tablet 4  . traMADol (ULTRAM) 50 MG tablet Take 1 tablet (50 mg total) by mouth daily as needed for moderate pain. 30 tablet 0  . propranolol (INDERAL) 10 MG tablet Take 20 mg by mouth 2 (two) times daily.    Marland Kitchen KLOR-CON SPRINKLE 10 MEQ CR capsule Take 1 capsule (10 mEq total) by mouth daily as needed. When taking Lasix (Patient not taking: Reported on 07/25/2019) 30 capsule 3  . spironolactone (ALDACTONE) 25 MG tablet Take 0.5 tablets (12.5 mg total) by mouth daily. (Patient not taking: Reported on 07/25/2019) 15 tablet 1   Facility-Administered Medications Prior to Visit  Medication Dose Route Frequency Provider Last Rate Last Admin  . 0.9 %  sodium chloride infusion  500 mL Intravenous Once Armbruster, Carlota Raspberry, MD         Per HPI unless specifically indicated in ROS section below Review of Systems Objective:    BP 120/62 (BP Location: Left Arm, Patient Position: Sitting, Cuff Size: Normal)   Pulse 63   Ht 5\' 1"  (1.549 m)   Wt 191 lb 3 oz (86.7 kg)   SpO2 96%   BMI 36.12 kg/m   Wt Readings from Last 3 Encounters:  07/25/19 191 lb 3 oz (86.7 kg)  07/22/19 190 lb (86.2 kg)  05/13/19 189 lb 3 oz (85.8 kg)    Physical Exam Vitals and nursing note reviewed.  Constitutional:      General: She is not in acute distress.    Appearance: She is well-developed.  HENT:     Head: Normocephalic and atraumatic.     Right Ear: External ear normal.     Left Ear: External ear normal.     Nose: Nose normal.     Mouth/Throat:     Pharynx: No oropharyngeal exudate.  Eyes:     General: No scleral icterus.    Conjunctiva/sclera: Conjunctivae normal.     Pupils: Pupils are equal, round, and reactive to light.  Cardiovascular:     Rate and Rhythm: Normal rate and regular rhythm.     Heart sounds: Normal heart sounds. No murmur.  Pulmonary:     Effort: Pulmonary effort is normal. No respiratory distress.     Breath sounds: Normal breath sounds. No wheezing or rales.  Musculoskeletal:      Cervical back: Normal range of motion and neck supple.     Comments: See HPI for foot exam if done  Lymphadenopathy:     Cervical: No cervical adenopathy.  Skin:    General: Skin is warm and dry.     Findings: No rash.       Results for orders placed or performed in visit on 07/22/19  Pro b natriuretic peptide  Result Value Ref Range   NT-Pro BNP 147 0 - 287 pg/mL  Basic metabolic panel  Result Value Ref Range   Glucose 175 (H) 65 - 99 mg/dL   BUN 14 8 - 27 mg/dL   Creatinine, Ser 0.67 0.57 - 1.00 mg/dL   GFR calc non Af Amer 95 >59 mL/min/1.73   GFR calc Af Amer 110 >59 mL/min/1.73   BUN/Creatinine Ratio 21 12 - 28   Sodium 139 134 - 144 mmol/L   Potassium 4.1 3.5 - 5.2 mmol/L   Chloride 101 96 - 106 mmol/L   CO2 23 20 - 29 mmol/L   Calcium 9.1 8.7 - 10.3 mg/dL  Hepatic function panel  Result Value Ref Range   Total Protein 6.6 6.0 - 8.5 g/dL   Albumin 4.1 3.8 - 4.8 g/dL   Bilirubin Total 0.3 0.0 - 1.2 mg/dL   Bilirubin, Direct 0.13 0.00 - 0.40 mg/dL   Alkaline Phosphatase 78 39 - 117 IU/L   AST 38 0 - 40 IU/L   ALT 29 0 - 32 IU/L  TSH  Result Value Ref Range   TSH 2.010 0.450 - 4.500 uIU/mL  CBC  Result Value Ref Range   WBC 5.0 3.4 - 10.8 x10E3/uL   RBC 4.81 3.77 - 5.28 x10E6/uL   Hemoglobin 13.3 11.1 - 15.9 g/dL   Hematocrit 41.4 34.0 - 46.6 %   MCV 86 79 - 97 fL   MCH 27.7 26.6 - 33.0 pg   MCHC 32.1 31.5 - 35.7 g/dL   RDW 14.2 11.7 - 15.4 %   Platelets 124 (L) 150 - 450 x10E3/uL  HgB A1c  Result Value Ref Range   Hgb A1c MFr Bld 8.9 (H) 4.8 - 5.6 %   Est. average glucose Bld gHb Est-mCnc 209 mg/dL   Assessment & Plan:  This visit occurred during the SARS-CoV-2 public health emergency.  Safety protocols were in place, including screening questions prior to the visit, additional usage of staff PPE, and extensive cleaning of exam room while observing appropriate contact time as indicated for disinfecting solutions.   Problem List Items Addressed This Visit     Insulin dependent type 2 diabetes mellitus ([place]) - Primary    Chronic, uncontrolled. Sporadically checks sugars, remaining high.  Continue current regimen of metformin XR 750mg  bid and lantus 60u/65u daily. She is also on novolin R 20u TID with meals + SSI 5u for every 50 over 200.  Encouraged she schedule eye exam.  Will fill out application form for ozempic and levemir.       HTN (hypertension)    Chronic, stable. Continue current regimen - spironolactone, propranolol, losartan and lasix PRN.  Recent CR and potassium normal.  Propranolol recently started for portal hypertensive gastropathy.       Relevant Medications   propranolol (INDERAL) 20 MG tablet   GERD (gastroesophageal reflux disease)    Stable on omeprazole 40mg  bid.       Diabetic retinopathy ([place])    Overdue for f/u.       Cirrhosis of liver ([place])    Appreciate GI care.  Tolerating propranolol well.  Continue omeprazole BID.  S/p EGD 01/2019.  Concern for affording full liver disease workup.           Meds ordered this encounter  Medications  . propranolol (INDERAL) 20 MG tablet    Sig: Take 1 tablet (20 mg total) by mouth 2 (two) times daily.    Dispense:  180 tablet    Refill:  3   No orders of the defined types were  placed in this encounter.  Patient Instructions  We will work on new financial assistance application. No changes for now but will see if ozempic is approved.  Return in 3 months for follow up visit, bring sugar log to that visit.   Follow up plan: Return in about 3 months (around 10/25/2019) for follow up visit.  [name], MD

## 2019-08-02 ENCOUNTER — Other Ambulatory Visit: Payer: Self-pay

## 2019-08-03 ENCOUNTER — Telehealth: Payer: Self-pay | Admitting: Family Medicine

## 2019-08-03 NOTE — Assessment & Plan Note (Signed)
Overdue for f/u

## 2019-08-03 NOTE — Telephone Encounter (Signed)
Novo nordisk PAP application filled and in Lisa's box. plz verify with patient we are applying for novolin R, levemir, and ozempic.

## 2019-08-03 NOTE — Assessment & Plan Note (Addendum)
Appreciate GI care.  Tolerating propranolol well.  Continue omeprazole BID.  S/p EGD 01/2019.  Concern for affording full liver disease workup.

## 2019-08-03 NOTE — Assessment & Plan Note (Signed)
Stable on omeprazole 40mg  bid.

## 2019-08-03 NOTE — Assessment & Plan Note (Addendum)
Chronic, uncontrolled. Sporadically checks sugars, remaining high.  Continue current regimen of metformin XR 750mg  bid and lantus 60u/65u daily. She is also on novolin R 20u TID with meals + SSI 5u for every 50 over 200.  Encouraged she schedule eye exam.  Will fill out application form for ozempic and levemir.

## 2019-08-03 NOTE — Assessment & Plan Note (Addendum)
Chronic, stable. Continue current regimen - spironolactone, propranolol, losartan and lasix PRN.  Recent CR and potassium normal.  Propranolol recently started for portal hypertensive gastropathy.

## 2019-08-04 ENCOUNTER — Encounter: Payer: Self-pay | Admitting: Family Medicine

## 2019-08-05 ENCOUNTER — Other Ambulatory Visit: Payer: Self-pay

## 2019-08-05 NOTE — Telephone Encounter (Signed)
Faxed form.  Decision pending.  

## 2019-08-06 ENCOUNTER — Other Ambulatory Visit: Payer: Self-pay

## 2019-08-06 DIAGNOSIS — I10 Essential (primary) hypertension: Secondary | ICD-10-CM

## 2019-08-06 LAB — BASIC METABOLIC PANEL
BUN/Creatinine Ratio: 16 (ref 12–28)
BUN: 10 mg/dL (ref 8–27)
CO2: 22 mmol/L (ref 20–29)
Calcium: 9.6 mg/dL (ref 8.7–10.3)
Chloride: 104 mmol/L (ref 96–106)
Creatinine, Ser: 0.64 mg/dL (ref 0.57–1.00)
GFR calc Af Amer: 111 mL/min/{1.73_m2} (ref >59)
GFR calc non Af Amer: 97 mL/min/{1.73_m2} (ref >59)
Glucose: 133 mg/dL — ABNORMAL HIGH (ref 65–99)
Potassium: 4.5 mmol/L (ref 3.5–5.2)
Sodium: 140 mmol/L (ref 134–144)

## 2019-08-08 ENCOUNTER — Other Ambulatory Visit (HOSPITAL_COMMUNITY): Payer: Self-pay

## 2019-08-08 NOTE — Telephone Encounter (Signed)
Received faxed approval, valid until 07/31/2020.

## 2019-08-12 ENCOUNTER — Ambulatory Visit: Payer: Self-pay

## 2019-08-12 ENCOUNTER — Encounter: Payer: Self-pay | Admitting: Family Medicine

## 2019-08-12 NOTE — Telephone Encounter (Signed)
Patient is calling in regards to approval letter she received from [employer]. Patient is not sure what was ordered for her. Please advise  She stated you could just message her back on my chart

## 2019-08-13 NOTE — Telephone Encounter (Addendum)
Updated pt's immunizations in chart.  Will fwd to Dr. Darnell Level to address novolin vs novolin R for pt.    Also, I will inform pt, per [employer] pt assistance program, new pts approved for the program are eligible for insulin vials only.

## 2019-08-15 ENCOUNTER — Other Ambulatory Visit (HOSPITAL_COMMUNITY): Payer: Self-pay

## 2019-08-15 ENCOUNTER — Other Ambulatory Visit: Payer: Self-pay | Admitting: Family Medicine

## 2019-08-21 ENCOUNTER — Encounter: Payer: Self-pay | Admitting: Family Medicine

## 2019-08-22 ENCOUNTER — Encounter: Payer: Self-pay | Admitting: Family Medicine

## 2019-08-22 NOTE — Telephone Encounter (Signed)
Received Novo Nordisk shipment:  Novolin R- 12 bxs Ozempic- 4 bxs Lefemir- 10 bxs  [Placed meds in 1st refrigerator, bottom shelf.]   Notified pt via MyChart.

## 2019-08-23 ENCOUNTER — Encounter (HOSPITAL_COMMUNITY): Payer: Self-pay

## 2019-08-23 ENCOUNTER — Other Ambulatory Visit (HOSPITAL_COMMUNITY): Payer: Self-pay

## 2019-08-23 NOTE — Telephone Encounter (Signed)
Spoke with [employer] pt assistance program customer svc asking about pen needles for pt's Levemir.  Confirmed pt can receive pen needles via their program and will fax the form needed to place the order.   Will notify pt via MyChart.

## 2019-08-26 ENCOUNTER — Telehealth: Payer: Self-pay

## 2019-08-26 NOTE — Telephone Encounter (Signed)
Received faxed order form from [employer] for NovoFine 32 G pen needles.  Pt will use with Levemir FlexTouch pen. [see Pt Msg, 08/22/19].    Placed form in Dr. Synthia Innocent box to sign & date.

## 2019-08-27 NOTE — Telephone Encounter (Signed)
Form filled and in Lisa's box.  

## 2019-08-29 ENCOUNTER — Other Ambulatory Visit: Payer: Self-pay | Admitting: Family Medicine

## 2019-09-04 ENCOUNTER — Other Ambulatory Visit: Payer: Self-pay | Admitting: Family Medicine

## 2019-09-05 ENCOUNTER — Other Ambulatory Visit (HOSPITAL_COMMUNITY): Payer: Self-pay

## 2019-09-06 ENCOUNTER — Telehealth: Payer: Self-pay

## 2019-09-06 ENCOUNTER — Encounter: Payer: Self-pay | Admitting: Family Medicine

## 2019-09-06 NOTE — Telephone Encounter (Signed)
Received pen needles (4-bxs) today from [employer].  Placed items [plastic shopping bag] in basket on Lisa's desk.  Notified pt of delivery via [place].

## 2019-09-06 NOTE — Telephone Encounter (Signed)
Updated pt's chart.  

## 2019-09-10 ENCOUNTER — Other Ambulatory Visit: Payer: Self-pay | Admitting: Interventional Cardiology

## 2019-09-13 ENCOUNTER — Telehealth: Payer: Self-pay

## 2019-09-13 NOTE — Telephone Encounter (Signed)
error 

## 2019-09-17 ENCOUNTER — Other Ambulatory Visit (HOSPITAL_COMMUNITY): Payer: Self-pay

## 2019-09-18 ENCOUNTER — Encounter: Payer: Self-pay | Admitting: Family Medicine

## 2019-09-27 ENCOUNTER — Encounter: Payer: Self-pay | Admitting: Family Medicine

## 2019-10-08 ENCOUNTER — Other Ambulatory Visit (HOSPITAL_COMMUNITY): Payer: Self-pay

## 2019-10-28 ENCOUNTER — Other Ambulatory Visit: Payer: Self-pay

## 2019-10-28 ENCOUNTER — Ambulatory Visit (INDEPENDENT_AMBULATORY_CARE_PROVIDER_SITE_OTHER): Payer: Self-pay | Admitting: Family Medicine

## 2019-10-28 ENCOUNTER — Encounter: Payer: Self-pay | Admitting: Family Medicine

## 2019-10-28 VITALS — BP 110/68 | HR 72 | Temp 97.0°F | Ht 61.0 in | Wt 181.2 lb

## 2019-10-28 DIAGNOSIS — M79641 Pain in right hand: Secondary | ICD-10-CM | POA: Insufficient documentation

## 2019-10-28 DIAGNOSIS — M79642 Pain in left hand: Secondary | ICD-10-CM

## 2019-10-28 DIAGNOSIS — K7469 Other cirrhosis of liver: Secondary | ICD-10-CM

## 2019-10-28 DIAGNOSIS — E119 Type 2 diabetes mellitus without complications: Secondary | ICD-10-CM

## 2019-10-28 DIAGNOSIS — Z794 Long term (current) use of insulin: Secondary | ICD-10-CM

## 2019-10-28 LAB — POCT GLYCOSYLATED HEMOGLOBIN (HGB A1C): Hemoglobin A1C: 8.4 % — AB (ref 4.0–5.6)

## 2019-10-28 MED ORDER — OZEMPIC (1 MG/DOSE) 2 MG/1.5ML ~~LOC~~ SOPN: 1.0000 mg | PEN_INJECTOR | SUBCUTANEOUS | 1 refills | Status: DC

## 2019-10-28 NOTE — Assessment & Plan Note (Signed)
Chronic. Update A1c. Discussed increasing ozempic to 1mg  weekly if sugars staying uncontrolled. Continue levemir, novolin R and metformin otherwise.

## 2019-10-28 NOTE — Assessment & Plan Note (Signed)
Discussed further evaluation for inflammatory arthritis. Tylenol and tramadol ineffective for pain, worse at night time. Will trial topical voltaren gel OTC for hand pain.

## 2019-10-28 NOTE — Assessment & Plan Note (Signed)
Has seen GI, planned yearly f/u. Overall stable period. Tolerating propranolol well.

## 2019-10-28 NOTE — Progress Notes (Signed)
This visit was conducted in person.  BP 110/68   Pulse 72   Temp (!) 97 F (36.1 C) (Temporal)   Ht 5\' 1"  (1.549 m)   Wt 181 lb 3.2 oz (82.2 kg)   SpO2 96%   BMI 34.24 kg/m    CC: DM f/u visit  Subjective:    Patient ID: Karen [name], female    DOB: 03-25-1956, 64 y.o.   MRN: [id number]  HPI: Karen [name] is a 64 y.o. female presenting on 10/28/2019 for Follow-up and Hand Pain   Ongoing hand pain - tylenol, tramadol ineffective. Brother seeing rheumatologist for arthritis evaluation - pending results.   DM - does regularly check sugars this morning 203 - staying 200s. Highest 294, lowest 120. Compliant with antihyperglycemic regimen which includes: levemir 60u in am and 65u in pm, novolin R 20u QID + SSI 5u for every 50>200, metformin XR 750mg  QHS, ozempic 0.5mg  weekly. Tolerating well without nausea but notices some burning pain to sides of abdomen. Denies low sugars or hypoglycemic symptoms. Denies paresthesias. Last diabetic eye exam DUE. Pneumovax: 2015. Prevnar: not due. Glucometer brand: Auvon. DSME: declines. Lab Results  Component Value Date   HGBA1C 8.4 (A) 10/28/2019   Diabetic Foot Exam - Simple   No data filed     Lab Results  Component Value Date   MICROALBUR 3.3 (H) 07/02/2015     10 lb weight loss.  Cirrhosis stable period.      Relevant past medical, surgical, family and social history reviewed and updated as indicated. Interim medical history since our last visit reviewed. Allergies and medications reviewed and updated. Outpatient Medications Prior to Visit  Medication Sig Dispense Refill  . acetaminophen (TYLENOL) 500 MG tablet Take 1,000 mg by mouth 3 (three) times daily as needed for mild pain.    Marland Kitchen aspirin EC 81 MG EC tablet Take 1 tablet (81 mg total) by mouth daily.    Marland Kitchen dicyclomine (BENTYL) 20 MG tablet Take 1 tablet (20 mg total) by mouth 2 (two) times daily as needed for spasms. 45 tablet 3  . furosemide (LASIX) 20 MG tablet Take 1 tablet  (20 mg total) by mouth daily as needed for edema. 30 tablet 3  . gabapentin (NEURONTIN) 300 MG capsule Take 1 capsule (300 mg total) by mouth at bedtime. 90 capsule 3  . insulin detemir (LEVEMIR FLEXTOUCH) 100 UNIT/ML FlexPen Inject 60 Units into the skin in the morning AND 65 Units every evening. 15 mL   . Insulin Pen Needle (PEN NEEDLES) 31G X 8 MM MISC Use to inject Lantus as directed twice daily. Dx: E11.40 60 each 3  . insulin regular (NOVOLIN R) 100 units/mL injection Inject 0.2 mLs (20 Units total) into the skin 3 (three) times daily before meals. + sliding scale insulin 5u for every 50 over 200 10 mL 6  . KLOR-CON SPRINKLE 10 MEQ CR capsule Take 1 capsule (10 mEq total) by mouth daily as needed. When taking Lasix 30 capsule 3  . losartan (COZAAR) 25 MG tablet TAKE ONE TABLET BY MOUTH DAILY 90 tablet 0  . metFORMIN (GLUCOPHAGE-XR) 750 MG 24 hr tablet TAKE TWO TABLETS BY MOUTH EVERY EVENING 60 tablet 2  . nitroGLYCERIN (NITROSTAT) 0.4 MG SL tablet Place 1 tablet (0.4 mg total) under the tongue every 5 (five) minutes as needed for chest pain. 25 tablet 0  . omeprazole (PRILOSEC) 40 MG capsule Take 1 capsule (40 mg total) by mouth 2 (two) times daily.  60 capsule 11  . polyethylene glycol (MIRALAX / GLYCOLAX) 17 g packet Take 17 g by mouth daily as needed.    . propranolol (INDERAL) 20 MG tablet Take 1 tablet (20 mg total) by mouth 2 (two) times daily. 180 tablet 3  . rosuvastatin (CRESTOR) 5 MG tablet TAKE ONE TABLET BY MOUTH DAILY 30 tablet 2  . sertraline (ZOLOFT) 50 MG tablet Take 1 tablet (50 mg total) by mouth daily. 30 tablet 4  . spironolactone (ALDACTONE) 25 MG tablet TAKE 1/2 TABLET BY MOUTH DAILY 45 tablet 3  . traMADol (ULTRAM) 50 MG tablet Take 1 tablet (50 mg total) by mouth daily as needed for moderate pain. 30 tablet 0  . Semaglutide,0.25 or 0.5MG /DOS, (OZEMPIC, 0.25 OR 0.5 MG/DOSE,) 2 MG/1.5ML SOPN Inject 0.25 mg into the skin once a week for 14 days, THEN 0.5 mg once a week.      . Insulin Glargine (LANTUS SOLOSTAR) 100 UNIT/ML Solostar Pen Inject 60 units in the morning and 65 units in the evenings. QS 3 mo 15 pen 3   Facility-Administered Medications Prior to Visit  Medication Dose Route Frequency Provider Last Rate Last Admin  . 0.9 %  sodium chloride infusion  500 mL Intravenous Once Armbruster, Carlota Raspberry, MD         Per HPI unless specifically indicated in ROS section below Review of Systems Objective:  BP 110/68   Pulse 72   Temp (!) 97 F (36.1 C) (Temporal)   Ht 5\' 1"  (1.549 m)   Wt 181 lb 3.2 oz (82.2 kg)   SpO2 96%   BMI 34.24 kg/m   Wt Readings from Last 3 Encounters:  10/28/19 181 lb 3.2 oz (82.2 kg)  07/25/19 191 lb 3 oz (86.7 kg)  07/22/19 190 lb (86.2 kg)      Physical Exam Vitals and nursing note reviewed.  Constitutional:      General: She is not in acute distress.    Appearance: Normal appearance. She is well-developed. She is obese. She is not ill-appearing.  Eyes:     General: No scleral icterus.    Extraocular Movements: Extraocular movements intact.     Conjunctiva/sclera: Conjunctivae normal.     Pupils: Pupils are equal, round, and reactive to light.  Cardiovascular:     Rate and Rhythm: Normal rate and regular rhythm.     Pulses: Normal pulses.     Heart sounds: Normal heart sounds. No murmur heard.   Pulmonary:     Effort: Pulmonary effort is normal. No respiratory distress.     Breath sounds: Normal breath sounds. No wheezing, rhonchi or rales.  Musculoskeletal:     Cervical back: Normal range of motion and neck supple.     Right lower leg: No edema.     Left lower leg: No edema.     Comments:  See HPI for foot exam if done No active synovitis to bilateral hands  Lymphadenopathy:     Cervical: No cervical adenopathy.  Skin:    General: Skin is warm and dry.     Findings: No rash.  Neurological:     Mental Status: She is alert.  Psychiatric:        Mood and Affect: Mood normal.        Behavior: Behavior  normal.       Results for orders placed or performed in visit on 10/28/19  POCT glycosylated hemoglobin (Hb A1C)  Result Value Ref Range   Hemoglobin A1C 8.4 (A)  4.0 - 5.6 %   HbA1c POC (<> result, manual entry)     HbA1c, POC (prediabetic range)     HbA1c, POC (controlled diabetic range)     Assessment & Plan:  This visit occurred during the SARS-CoV-2 public health emergency.  Safety protocols were in place, including screening questions prior to the visit, additional usage of staff PPE, and extensive cleaning of exam room while observing appropriate contact time as indicated for disinfecting solutions.   Problem List Items Addressed This Visit    Insulin dependent type 2 diabetes mellitus ([place]) - Primary    Chronic. Update A1c. Discussed increasing ozempic to 1mg  weekly if sugars staying uncontrolled. Continue levemir, novolin R and metformin otherwise.       Relevant Medications   Semaglutide, 1 MG/DOSE, (OZEMPIC, 1 MG/DOSE,) 2 MG/1.5ML SOPN   Other Relevant Orders   POCT glycosylated hemoglobin (Hb A1C) (Completed)   Cirrhosis of liver ([facility])    Has seen GI, planned yearly f/u. Overall stable period. Tolerating propranolol well.       Bilateral hand pain    Discussed further evaluation for inflammatory arthritis. Tylenol and tramadol ineffective for pain, worse at night time. Will trial topical voltaren gel OTC for hand pain.           Meds ordered this encounter  Medications  . Semaglutide, 1 MG/DOSE, (OZEMPIC, 1 MG/DOSE,) 2 MG/1.5ML SOPN    Sig: Inject 0.75 mLs (1 mg total) into the skin once a week.    Dispense:  6 pen    Refill:  1    Note new dose   Orders Placed This Encounter  Procedures  . POCT glycosylated hemoglobin (Hb A1C)    Patient Instructions  Schedule eye exam.  A1c today.  Return in 3-4 months for follow up visit, physical if you get insurance by then.  May try voltaren gel to hands.    Follow up plan: Return in about 3 months (around  01/28/2020) for follow up visit.  [name], MD

## 2019-10-28 NOTE — Patient Instructions (Addendum)
Schedule eye exam.  A1c today.  Return in 3-4 months for follow up visit, physical if you get insurance by then.  May try voltaren gel to hands.

## 2019-10-29 ENCOUNTER — Other Ambulatory Visit (HOSPITAL_COMMUNITY): Payer: Self-pay

## 2019-10-30 ENCOUNTER — Telehealth: Payer: Self-pay

## 2019-10-30 NOTE — Telephone Encounter (Signed)
Filled and in Lisa's box 

## 2019-10-30 NOTE — Telephone Encounter (Signed)
Received faxed refill request form from [facility] (PAP) for Novolin R and Ozempic.    Placed form in Dr. Synthia Innocent box.

## 2019-10-31 ENCOUNTER — Encounter: Payer: Self-pay | Admitting: Family Medicine

## 2019-10-31 NOTE — Telephone Encounter (Signed)
Faxed form.

## 2019-11-04 ENCOUNTER — Encounter: Payer: Self-pay | Admitting: Family Medicine

## 2019-11-04 ENCOUNTER — Ambulatory Visit: Payer: Self-pay | Admitting: Interventional Cardiology

## 2019-11-04 ENCOUNTER — Other Ambulatory Visit: Payer: Self-pay | Admitting: Family Medicine

## 2019-11-05 ENCOUNTER — Other Ambulatory Visit: Payer: Self-pay | Admitting: Family Medicine

## 2019-11-06 MED ORDER — TRAMADOL HCL 50 MG PO TABS: 50.0000 mg | ORAL_TABLET | Freq: Every day | ORAL | 0 refills | Status: DC | PRN

## 2019-11-06 NOTE — Telephone Encounter (Signed)
ERx 

## 2019-11-07 ENCOUNTER — Ambulatory Visit: Payer: Self-pay | Admitting: Family Medicine

## 2019-11-07 ENCOUNTER — Telehealth: Payer: Self-pay

## 2019-11-07 NOTE — Telephone Encounter (Signed)
Pt cancelled appt today for rt side pain and nausea pain level now 3-4. Pt has no fever, vomiting or diarrhea. Pt said she does have her gallbladder. Pt said there is no way she can come to office today and rescheduled for 11/08/19 at 8:30 with Dr Darnell Level. Pt said she is taking ozempic but has not increased dose yet. Pt has no covid symptoms, no travel and no known exposure to + covid. UC & ED precautions given and pt voiced understanding. FYI to Dr Darnell Level.

## 2019-11-08 ENCOUNTER — Ambulatory Visit: Payer: Self-pay | Admitting: Family Medicine

## 2019-11-11 ENCOUNTER — Ambulatory Visit: Payer: Self-pay | Admitting: Family Medicine

## 2019-11-12 ENCOUNTER — Other Ambulatory Visit: Payer: Self-pay

## 2019-11-12 ENCOUNTER — Ambulatory Visit (INDEPENDENT_AMBULATORY_CARE_PROVIDER_SITE_OTHER): Payer: Self-pay | Admitting: Family Medicine

## 2019-11-12 ENCOUNTER — Encounter: Payer: Self-pay | Admitting: Family Medicine

## 2019-11-12 VITALS — BP 122/64 | HR 77 | Temp 97.6°F | Ht 61.0 in | Wt 182.1 lb

## 2019-11-12 DIAGNOSIS — K7469 Other cirrhosis of liver: Secondary | ICD-10-CM

## 2019-11-12 DIAGNOSIS — R109 Unspecified abdominal pain: Secondary | ICD-10-CM | POA: Insufficient documentation

## 2019-11-12 DIAGNOSIS — Z794 Long term (current) use of insulin: Secondary | ICD-10-CM

## 2019-11-12 DIAGNOSIS — E119 Type 2 diabetes mellitus without complications: Secondary | ICD-10-CM

## 2019-11-12 LAB — POC URINALSYSI DIPSTICK (AUTOMATED)
Blood, UA: NEGATIVE
Glucose, UA: POSITIVE — AB
Leukocytes, UA: NEGATIVE
Nitrite, UA: NEGATIVE
Protein, UA: POSITIVE — AB
Spec Grav, UA: >=1.03 — AB (ref 1.010–1.025)
Urobilinogen, UA: 1 E.U./dL
pH, UA: 5.5 (ref 5.0–8.0)

## 2019-11-12 NOTE — Assessment & Plan Note (Signed)
H/o this. GI f/u limited due to cost.  Working towards disability/insurance.

## 2019-11-12 NOTE — Patient Instructions (Addendum)
Drop ozempic to 0.25mg  weekly until abdominal pain resolved.  Labs today, urinalysis today.  Let us know if not improving with this change.

## 2019-11-12 NOTE — Assessment & Plan Note (Addendum)
1.5 wk h/o R sided intermittent abdominal pain - possible PUD vs gallstone - continue daily PPI, check labs and urinalysis today - no blood or signs of UTI. For possible ozempic contribution, will decrease dose to 0.25mg  weekly until abd pain has resolved then consider further titration. Update with effect. Discussed low threshold for abdominal imaging if ongoing symptoms. She declined imaging today due to cost concerns.

## 2019-11-12 NOTE — Progress Notes (Signed)
This visit was conducted in person.  BP 122/64 (BP Location: Left Arm, Patient Position: Sitting, Cuff Size: Normal)   Pulse 77   Temp 97.6 F (36.4 C) (Temporal)   Ht 5\' 1"  (1.549 m)   Wt 182 lb 1 oz (82.6 kg)   SpO2 96%   BMI 34.40 kg/m    CC: abd pain Subjective:    Patient ID: Karen [name], female    DOB: [birth year], 64 y.o.   MRN: [id number]  HPI: Karen [name] is a 64 y.o. female presenting on 11/12/2019 for Abdominal Pain (C/o RLQ abd pain. Also, has had some nausea and acid reflux. Sxs started about 1.5 wks ago.  Tried Tums, helpful.  Denies vomiting or diarrhea.  )   1.5 wk h/o R sided sharp stabbing intermittent abdominal pain. Notices more when she urinates. Nocturia x4-5. Also notes increased daytime frequency and urgency. Episode 1 week ago with severe epigastric pain with nausea, acid reflux/indigestion. Some ongoing constipation.   Bentyl didn't help, tylenol didn't help.  Denies fevers/chills, dysuria, hematuria, vomiting, diarrhea, blood in stool. Appetite ok.  No new foods.  Ozempic started 2 months ago, currently on 0.5mg .  Continues levemir 60/65u bid.      Relevant past medical, surgical, family and social history reviewed and updated as indicated. Interim medical history since our last visit reviewed. Allergies and medications reviewed and updated. Outpatient Medications Prior to Visit  Medication Sig Dispense Refill  . acetaminophen (TYLENOL) 500 MG tablet Take 1,000 mg by mouth 3 (three) times daily as needed for mild pain.    Marland Kitchen aspirin EC 81 MG EC tablet Take 1 tablet (81 mg total) by mouth daily.    Marland Kitchen dicyclomine (BENTYL) 20 MG tablet Take 1 tablet (20 mg total) by mouth 2 (two) times daily as needed for spasms. 45 tablet 3  . furosemide (LASIX) 20 MG tablet Take 1 tablet (20 mg total) by mouth daily as needed for edema. 30 tablet 3  . gabapentin (NEURONTIN) 300 MG capsule Take 1 capsule (300 mg total) by mouth at bedtime. 90 capsule 3  . insulin  detemir (LEVEMIR FLEXTOUCH) 100 UNIT/ML FlexPen Inject 60 Units into the skin in the morning AND 65 Units every evening. 15 mL   . Insulin Pen Needle (PEN NEEDLES) 31G X 8 MM MISC Use to inject Lantus as directed twice daily. Dx: E11.40 60 each 3  . insulin regular (NOVOLIN R) 100 units/mL injection Inject 0.2 mLs (20 Units total) into the skin 3 (three) times daily before meals. + sliding scale insulin 5u for every 50 over 200 10 mL 6  . KLOR-CON SPRINKLE 10 MEQ CR capsule Take 1 capsule (10 mEq total) by mouth daily as needed. When taking Lasix 30 capsule 3  . losartan (COZAAR) 25 MG tablet TAKE ONE TABLET BY MOUTH DAILY 90 tablet 0  . metFORMIN (GLUCOPHAGE-XR) 750 MG 24 hr tablet TAKE TWO TABLETS BY MOUTH EVERY EVENING 60 tablet 2  . nitroGLYCERIN (NITROSTAT) 0.4 MG SL tablet Place 1 tablet (0.4 mg total) under the tongue every 5 (five) minutes as needed for chest pain. 25 tablet 0  . omeprazole (PRILOSEC) 40 MG capsule Take 1 capsule (40 mg total) by mouth 2 (two) times daily. 60 capsule 11  . polyethylene glycol (MIRALAX / GLYCOLAX) 17 g packet Take 17 g by mouth daily as needed.    . propranolol (INDERAL) 20 MG tablet Take 1 tablet (20 mg total) by mouth 2 (two) times daily.  180 tablet 3  . rosuvastatin (CRESTOR) 5 MG tablet TAKE ONE TABLET BY MOUTH DAILY 30 tablet 2  . Semaglutide, 1 MG/DOSE, (OZEMPIC, 1 MG/DOSE,) 2 MG/1.5ML SOPN Inject 0.75 mLs (1 mg total) into the skin once a week. 6 pen 1  . sertraline (ZOLOFT) 50 MG tablet TAKE ONE TABLET BY MOUTH DAILY**REPLACES CELEXA** 30 tablet 2  . spironolactone (ALDACTONE) 25 MG tablet TAKE 1/2 TABLET BY MOUTH DAILY 45 tablet 3  . traMADol (ULTRAM) 50 MG tablet Take 1 tablet (50 mg total) by mouth daily as needed for moderate pain. 30 tablet 0   Facility-Administered Medications Prior to Visit  Medication Dose Route Frequency Provider Last Rate Last Admin  . 0.9 %  sodium chloride infusion  500 mL Intravenous Once Armbruster, Carlota Raspberry, MD           Per HPI unless specifically indicated in ROS section below Review of Systems Objective:  BP 122/64 (BP Location: Left Arm, Patient Position: Sitting, Cuff Size: Normal)   Pulse 77   Temp 97.6 F (36.4 C) (Temporal)   Ht 5\' 1"  (1.549 m)   Wt 182 lb 1 oz (82.6 kg)   SpO2 96%   BMI 34.40 kg/m   Wt Readings from Last 3 Encounters:  11/12/19 182 lb 1 oz (82.6 kg)  10/28/19 181 lb 3.2 oz (82.2 kg)  07/25/19 191 lb 3 oz (86.7 kg)      Physical Exam Vitals and nursing note reviewed.  Constitutional:      Appearance: Normal appearance. She is not ill-appearing.  Cardiovascular:     Rate and Rhythm: Normal rate and regular rhythm.     Pulses: Normal pulses.     Heart sounds: Normal heart sounds. No murmur heard.   Pulmonary:     Effort: Pulmonary effort is normal. No respiratory distress.     Breath sounds: Normal breath sounds. No wheezing, rhonchi or rales.  Abdominal:     General: Bowel sounds are normal. There is no distension.     Palpations: Abdomen is soft. There is no mass.     Tenderness: There is abdominal tenderness (moderate) in the right upper quadrant and epigastric area. There is no right CVA tenderness, left CVA tenderness, guarding or rebound. Negative signs include Murphy's sign.     Hernia: No hernia is present.  Neurological:     Mental Status: She is alert.  Psychiatric:        Mood and Affect: Mood normal.        Behavior: Behavior normal.       Results for orders placed or performed in visit on 11/12/19  POCT Urinalysis Dipstick (Automated)  Result Value Ref Range   Color, UA gold    Clarity, UA clear    Glucose, UA Positive (A) Negative   Bilirubin, UA 1+    Ketones, UA +/-    Spec Grav, UA >=1.030 (A) 1.010 - 1.025   Blood, UA negative    pH, UA 5.5 5.0 - 8.0   Protein, UA Positive (A) Negative   Urobilinogen, UA 1.0 0.2 or 1.0 E.U./dL   Nitrite, UA negative    Leukocytes, UA Negative Negative   Lab Results  Component Value Date   HGBA1C  8.4 (A) 10/28/2019    Lab Results  Component Value Date   ALT 29 07/22/2019   AST 38 07/22/2019   ALKPHOS 78 07/22/2019   BILITOT 0.3 07/22/2019    Lab Results  Component Value Date   CREATININE  0.64 08/06/2019   BUN 10 08/06/2019   NA 140 08/06/2019   K 4.5 08/06/2019   CL 104 08/06/2019   CO2 22 08/06/2019    Lab Results  Component Value Date   WBC 5.0 07/22/2019   HGB 13.3 07/22/2019   HCT 41.4 07/22/2019   MCV 86 07/22/2019   PLT 124 (L) 07/22/2019    Assessment & Plan:  This visit occurred during the SARS-CoV-2 public health emergency.  Safety protocols were in place, including screening questions prior to the visit, additional usage of staff PPE, and extensive cleaning of exam room while observing appropriate contact time as indicated for disinfecting solutions.   Problem List Items Addressed This Visit    Right sided abdominal pain - Primary    1.5 wk h/o R sided intermittent abdominal pain - possible PUD vs gallstone - continue daily PPI, check labs and urinalysis today - no blood or signs of UTI. For possible ozempic contribution, will decrease dose to 0.25mg  weekly until abd pain has resolved then consider further titration. Update with effect. Discussed low threshold for abdominal imaging if ongoing symptoms. She declined imaging today due to cost concerns.       Relevant Orders   Comprehensive metabolic panel   CBC with Differential/Platelet   Lipase   POCT Urinalysis Dipstick (Automated) (Completed)   Insulin dependent type 2 diabetes mellitus ([place])   Cirrhosis of liver ([place])    H/o this. GI f/u limited due to cost.  Working towards disability/insurance.           No orders of the defined types were placed in this encounter.  Orders Placed This Encounter  Procedures  . Comprehensive metabolic panel  . CBC with Differential/Platelet  . Lipase  . POCT Urinalysis Dipstick (Automated)    Patient Instructions  Drop ozempic to 0.25mg  weekly until  abdominal pain resolved.  Labs today, urinalysis today.  Let us know if not improving with this change.    Follow up plan: Return if symptoms worsen or fail to improve.  [name], MD

## 2019-11-13 ENCOUNTER — Encounter: Payer: Self-pay | Admitting: Family Medicine

## 2019-11-13 LAB — LIPASE: Lipase: 32 U/L (ref 11.0–59.0)

## 2019-11-13 LAB — CBC WITH DIFFERENTIAL/PLATELET
Basophils Absolute: 0 10*3/uL (ref 0.0–0.1)
Basophils Relative: 0.6 % (ref 0.0–3.0)
Eosinophils Absolute: 0 10*3/uL (ref 0.0–0.7)
Eosinophils Relative: 1 % (ref 0.0–5.0)
HCT: 39.7 % (ref 36.0–46.0)
Hemoglobin: 13.1 g/dL (ref 12.0–15.0)
Lymphocytes Relative: 29.9 % (ref 12.0–46.0)
Lymphs Abs: 1.3 10*3/uL (ref 0.7–4.0)
MCHC: 33 g/dL (ref 30.0–36.0)
MCV: 86.2 fl (ref 78.0–100.0)
Monocytes Absolute: 0.3 10*3/uL (ref 0.1–1.0)
Monocytes Relative: 6.8 % (ref 3.0–12.0)
Neutro Abs: 2.7 10*3/uL (ref 1.4–7.7)
Neutrophils Relative %: 61.7 % (ref 43.0–77.0)
Platelets: 104 10*3/uL — ABNORMAL LOW (ref 150.0–400.0)
RBC: 4.6 Mil/uL (ref 3.87–5.11)
RDW: 15.3 % (ref 11.5–15.5)
WBC: 4.4 10*3/uL (ref 4.0–10.5)

## 2019-11-13 LAB — COMPREHENSIVE METABOLIC PANEL
ALT: 20 U/L (ref 0–35)
AST: 23 U/L (ref 0–37)
Albumin: 4.1 g/dL (ref 3.5–5.2)
Alkaline Phosphatase: 63 U/L (ref 39–117)
BUN: 15 mg/dL (ref 6–23)
CO2: 26 mEq/L (ref 19–32)
Calcium: 9.5 mg/dL (ref 8.4–10.5)
Chloride: 102 mEq/L (ref 96–112)
Creatinine, Ser: 0.72 mg/dL (ref 0.40–1.20)
GFR: 82.02 mL/min (ref >60.00)
Glucose, Bld: 214 mg/dL — ABNORMAL HIGH (ref 70–99)
Potassium: 4.5 mEq/L (ref 3.5–5.1)
Sodium: 136 mEq/L (ref 135–145)
Total Bilirubin: 0.5 mg/dL (ref 0.2–1.2)
Total Protein: 7 g/dL (ref 6.0–8.3)

## 2019-11-14 ENCOUNTER — Other Ambulatory Visit (HOSPITAL_COMMUNITY): Payer: Self-pay

## 2019-11-14 ENCOUNTER — Other Ambulatory Visit: Payer: Self-pay | Admitting: Family Medicine

## 2019-11-14 ENCOUNTER — Encounter: Payer: Self-pay | Admitting: Family Medicine

## 2019-11-14 DIAGNOSIS — D696 Thrombocytopenia, unspecified: Secondary | ICD-10-CM | POA: Insufficient documentation

## 2019-11-14 NOTE — Telephone Encounter (Signed)
E-scribed refills.  

## 2019-11-28 ENCOUNTER — Encounter (HOSPITAL_COMMUNITY): Payer: Self-pay

## 2019-11-28 ENCOUNTER — Encounter (HOSPITAL_COMMUNITY): Payer: Self-pay | Admitting: Cardiology

## 2019-11-28 ENCOUNTER — Telehealth: Payer: Self-pay

## 2019-11-28 ENCOUNTER — Other Ambulatory Visit (HOSPITAL_COMMUNITY): Payer: Self-pay

## 2019-11-28 ENCOUNTER — Telehealth (HOSPITAL_COMMUNITY): Payer: Self-pay | Admitting: Interventional Cardiology

## 2019-11-28 DIAGNOSIS — E119 Type 2 diabetes mellitus without complications: Secondary | ICD-10-CM

## 2019-11-28 NOTE — Telephone Encounter (Signed)
Just an FYI. We have made several attempts to contact this patient including sending a letter to schedule or reschedule their echocardiogram. We will be removing the patient from the echo [place].   11/28/19 and 08/23/2019 Pt No Showed echo/[facility]  cancelled 10/29/19,09/17/19,09/05/19,08/15/19, 08/08/19       Thank you

## 2019-11-28 NOTE — Progress Notes (Unsigned)
Patient ID: Karen [name], female   DOB: 23-Feb-1956, 64 y.o.   MRN: [id number]   Verified appointment "no show" status with Melissa at 11:21am.

## 2019-11-28 NOTE — Telephone Encounter (Signed)
Received pt's Ozempic (4- boxes) delivery from [employer] today.  [Placed Ozempic (4-boxes) in 1st refrigerator, 5th shelf.]  Sent pt MyChart message making her aware of delivery.

## 2019-12-04 ENCOUNTER — Ambulatory Visit: Payer: Self-pay | Admitting: Cardiology

## 2019-12-10 ENCOUNTER — Encounter: Payer: Self-pay | Admitting: Family Medicine

## 2019-12-11 NOTE — Telephone Encounter (Signed)
This has been responded to in another message.

## 2019-12-12 NOTE — Telephone Encounter (Signed)
LVM for [place] at [phone] to return call at 4pm today to place order for levemir flex pens and pen needles.

## 2019-12-12 NOTE — Telephone Encounter (Signed)
Geni Bers from [employer] returned call. She reports pt received Ozempic last in June, Cora in March, [place] R in March and Pen Needles in April. Pt receives a 4 month supply. Pt received on last order; 4 Ozempic 10 Levemir 12 Novolin R 4 boxes Pen Needles

## 2019-12-13 NOTE — Telephone Encounter (Signed)
Received forms from [employer] and filled out LVM for pt Need to know if pt also need Novolin R.

## 2019-12-13 NOTE — Telephone Encounter (Signed)
Pt came by clinic to pick up Ozempic. Discussed with pt when she needed Levemir Flexpen. She has 4 pens left and one pen last 2.5 days so she has bout 10 days left. She also needs Novelin R and pen needles. She reports that the Ozempic is not right because what was sent is only dispensed as 1 mg doses and she is on 0.25mg  dosing due to abd pain and PCP reducing dose. This pen cannot dispense any other dose except 1mg .  She has enough Ozempic for one more month.  Advised to leave the Ozempic here and this nurse would f/u with Dr. Darnell Level and Lattie Haw to see why the discrepancy in the dose order and the one in the chart and see if the order needs to be sent back. Advised the order for the Levemir and the novelin R and pen needles would be sent in today. Pt appreciative. Advised this office would f/u next week. Pt reports she would rather communicate by my chart if possible and verbalized understanding.

## 2019-12-13 NOTE — Telephone Encounter (Signed)
LVM for [facility] to call back concerning shipment received that was incorrect dosage and what should be done with it.

## 2019-12-13 NOTE — Telephone Encounter (Signed)
Per Dr. Darnell Level pt should start taking 0.5mg /wk after receiving new shipment and contact office if any abdominal discomfort.  Order filled out, med list updated and printed, faxed order.

## 2019-12-13 NOTE — Telephone Encounter (Signed)
[facility] said the pt will need to go on [email or web address] and print off the voucher or call [phone] and they can send it to her that way or maybe straight to her pharmacy.   LVM that pt could call with questions but a msg would be sent.

## 2019-12-13 NOTE — Telephone Encounter (Signed)
[facility] returned call. Spoke with Karen [name] who reports she will fax return shipping label to return the wrong dose Ozempic. She reports if pt has not received an emergency supply of any meds she could get a 30 day supply at a retail pharmacy.

## 2019-12-13 NOTE — Addendum Note (Signed)
Addended by: Randall An on: 12/13/2019 12:37 PM   Modules accepted: Orders

## 2019-12-13 NOTE — Telephone Encounter (Signed)
Noted! Thank you

## 2019-12-16 NOTE — Telephone Encounter (Signed)
Contacted pt and she would like to wait as long as she can to see if the shipment would happen to come in so she would not have to use the emergency supply. She would like to use [facility], on [address], if she does need to use it and she would like this office to call and get the voucher. Pt thinks she will call Thurs or Frid if shipment has not come in by then. Gave pt line to this nurses office.

## 2019-12-17 ENCOUNTER — Other Ambulatory Visit (HOSPITAL_COMMUNITY): Payer: Self-pay

## 2019-12-18 NOTE — Telephone Encounter (Signed)
LVM at [facility] for them to call back between [phone] today to f/u on order placed and return shipping label that was not received.

## 2019-12-19 MED ORDER — LEVEMIR FLEXTOUCH 100 UNIT/ML ~~LOC~~ SOPN: PEN_INJECTOR | SUBCUTANEOUS | 0 refills | Status: DC

## 2019-12-19 NOTE — Telephone Encounter (Signed)
Pt called and would like to get the voucher for the free 30 day supply. Advised this nurse will get in touch with [facility] and send in script to [facility], [place], [place]. Pharmacy is already in chart.  Contacted [facility] emergency supply department and was given numbers to give pharmacist for free supply of levemir. Script will also need to be sent in.  Pt will need to give these numbers to pharmacist; BIN:  [id number] PCN:   [id number] Group: [id number] ID #: [id number]  Was on hold with pt assistance department to check on order faxed last week and return shipping label that was not received to ship ozempic back. They advised the order was missing sig for Novolin R and max dose for levemir. After that the call dropped.  Filled in those remaining parts on the order and refaxed.    Verified script for levemir with Dr. Orson Ape in script to [facility].

## 2019-12-19 NOTE — Addendum Note (Signed)
Addended by: Randall An on: 12/19/2019 05:27 PM   Modules accepted: Orders

## 2019-12-19 NOTE — Telephone Encounter (Signed)
[facility] called in stating they are returning call to discuss a form with [employer]. Stated they were able to fax form over for missing information. Please advise.

## 2019-12-20 ENCOUNTER — Ambulatory Visit: Payer: Self-pay | Admitting: Physician Assistant

## 2019-12-20 NOTE — Telephone Encounter (Signed)
Patient called in stating she will need to have a prior authorization for this medication. Please advise.

## 2019-12-20 NOTE — Telephone Encounter (Signed)
Contacted Alden Benjamin, pharmacist at [facility], who reports when numbers were run it said the group number was not correct. She read the group number and it was not the same as given. Gave correct number and she ran it through with no problem.  Alden Benjamin reports she currently does not have any Levemir in stock but needs to check if it has been ordered and when it will be in and she will contact the pt. Advised also that this nurse will also contact the pt and let her know to contact the pharmacy to work out pick up of Levemir.   LVM for pt

## 2019-12-20 NOTE — Telephone Encounter (Signed)
[employer] and spoke with Truitt Merle who reports she does not have the updated order but it takes 2 business days to process a faxed order and once it is processed it has to be approved and then it takes 10-14 days for it to be processed and shipped. So it will be Monday at the earliest that they will know if the order is approved or not.  She is faxing a new shipping label to the main fax line.  They are processing the new the Ozempic order currently because it was correct the first time.

## 2019-12-20 NOTE — Telephone Encounter (Signed)
Advised pt. She will contact pharmacy and let this office know if anything further is needed.

## 2019-12-23 ENCOUNTER — Encounter: Payer: Self-pay | Admitting: Family Medicine

## 2019-12-25 NOTE — Telephone Encounter (Signed)
No label yet. I have advised front office to look for it and to route it to me if they received it. We may need to contact them again.

## 2019-12-25 NOTE — Telephone Encounter (Signed)
Have you seen a return shipping label for the incorrect Ozempic yet?

## 2019-12-25 NOTE — Telephone Encounter (Signed)
[employer] and spoke with [place] who reports levemir and needles were processed on 8/10 and new dose of Ozempic was processed on 8/4. Once processed it takes 10-14 business days to ship out. She said it could take less but can take up to 14 days to ship. She said she also did not see any record of a request for a shipping label so she is requesting that now. She is also thinking that the return shipping label may be in the new shipment that will arrive. . She also reports pt only has one more RF of all of these products once she receives these shipments. She reports the novolin R was not processed because they could not read the max daily dose so it needs to be refaxed. Advised it will be refaxed.    Made max dose of novolin more clear on order and refaxed.

## 2019-12-25 NOTE — Telephone Encounter (Signed)
Noted  

## 2019-12-26 NOTE — Telephone Encounter (Signed)
Best number [phone] Pt called concerned about covid delta variant What would you recommend for pt

## 2020-01-02 ENCOUNTER — Telehealth: Payer: Self-pay | Admitting: Family Medicine

## 2020-01-02 NOTE — Telephone Encounter (Signed)
Pt is aware ozempic is ready for pick up but she was asking about levemir, novolin r, pen needls for levemir  Wanting to know when these will be in she only wants to make one trip and pick up all at once  Please advise Please call pt my chart is not working

## 2020-01-02 NOTE — Telephone Encounter (Signed)
Received new Ozempic shipment (5 bxs).  Will notify pt via MyChart.   [Placed Ozempic (5 bxs total) in 1st refrigerator, 2nd shelf.]  FYI to [place], RN: No return label included, for incorrect Ozempic order, in this shipment as indicated in previous message.

## 2020-01-02 NOTE — Telephone Encounter (Signed)
Spoke with pt letting her know Ozempic was the only thing in today's shipment.  We will inform her when the other items arrive.  Pt verbalizes understanding and expresses her thanks.

## 2020-01-06 ENCOUNTER — Other Ambulatory Visit (HOSPITAL_COMMUNITY): Payer: Self-pay

## 2020-01-07 ENCOUNTER — Telehealth: Payer: Self-pay

## 2020-01-07 NOTE — Telephone Encounter (Signed)
Levemir delivered and is in the frig.

## 2020-01-07 NOTE — Telephone Encounter (Signed)
Received pt's Levemir (10 bxs) delivery today.  Will notify pt via MyChart.  [Placed in 1st refrigerator, 4th shelf.]

## 2020-01-07 NOTE — Telephone Encounter (Signed)
Pt called to report Lattie Haw has already told her that the levemir has come in. She is wondering when the novolin and pen needles are coming. Advised not sure when the others are coming but this office will contact her when they arrive.  Pt also reported she has spots where she injects the levemir on her abdomen that is like "diabetic neuropathy, like tingling" and feels funny and she is not sure what it is. Pt denies the spots are painful. She reports this has occurred for at least 2 months and is not a new occurrence. Advised pt she should have apt for assessment. Pt agreed to move apt to sooner date. Scheduled pt for 01/15/20. Advised if any changes to contact office. Pt verbalized understanding.

## 2020-01-13 ENCOUNTER — Encounter: Payer: Self-pay | Admitting: Family Medicine

## 2020-01-13 ENCOUNTER — Other Ambulatory Visit: Payer: Self-pay | Admitting: Family Medicine

## 2020-01-13 NOTE — Telephone Encounter (Signed)
Gabapentin Last filled:  10/14/19, #90 Last OV:  11/12/19, R side pain Next OV:  3-4 mo f/u

## 2020-01-13 NOTE — Telephone Encounter (Signed)
Noted  

## 2020-01-13 NOTE — Telephone Encounter (Signed)
Received VM that pt received letter from [facility] that they needed further info for the novolin R script. They are faxing another sheet.

## 2020-01-14 NOTE — Telephone Encounter (Signed)
Faxed form.

## 2020-01-14 NOTE — Telephone Encounter (Signed)
Received faxed order form.  Placed in Dr. Synthia Innocent box.

## 2020-01-14 NOTE — Telephone Encounter (Addendum)
Filled and in Lisa's box.  Novolin R dose is 20u TID AC + SSI 5u for every 50 over 200.

## 2020-01-15 ENCOUNTER — Ambulatory Visit: Payer: Self-pay | Admitting: Physician Assistant

## 2020-01-15 ENCOUNTER — Ambulatory Visit: Payer: Self-pay | Admitting: Family Medicine

## 2020-01-17 NOTE — Telephone Encounter (Signed)
Pt stopped by clinic to pick up ozempic and levemir.

## 2020-01-24 ENCOUNTER — Telehealth: Payer: Self-pay

## 2020-01-24 NOTE — Telephone Encounter (Signed)
Received shipment from [place].  Contained pen needles (4- bxs).    Notified pt via [place].   [Placed needles (in grey shopping bag) in basket on Lisa's desk.]

## 2020-01-28 ENCOUNTER — Ambulatory Visit: Payer: Self-pay | Admitting: Family Medicine

## 2020-01-29 ENCOUNTER — Other Ambulatory Visit (HOSPITAL_COMMUNITY): Payer: Self-pay

## 2020-02-04 ENCOUNTER — Telehealth: Payer: Self-pay

## 2020-02-04 NOTE — Telephone Encounter (Signed)
Received pt's Novlolin R insulin shipment (12 vials total) today.  Made pt aware via MyChart.   [Placed insulin in 1st refrigerator, 4th shelf.]

## 2020-02-05 ENCOUNTER — Ambulatory Visit: Payer: Self-pay | Admitting: Family Medicine

## 2020-02-07 ENCOUNTER — Ambulatory Visit: Payer: Self-pay | Admitting: Family Medicine

## 2020-02-12 ENCOUNTER — Ambulatory Visit: Payer: Self-pay | Admitting: Physician Assistant

## 2020-02-12 ENCOUNTER — Ambulatory Visit: Payer: Self-pay | Admitting: Family Medicine

## 2020-02-13 ENCOUNTER — Other Ambulatory Visit (HOSPITAL_COMMUNITY): Payer: Self-pay

## 2020-02-19 ENCOUNTER — Telehealth (INDEPENDENT_AMBULATORY_CARE_PROVIDER_SITE_OTHER): Payer: Self-pay | Admitting: Family Medicine

## 2020-02-19 ENCOUNTER — Encounter: Payer: Self-pay | Admitting: Family Medicine

## 2020-02-19 ENCOUNTER — Telehealth: Payer: Self-pay | Admitting: *Deleted

## 2020-02-19 ENCOUNTER — Encounter: Payer: Self-pay | Admitting: Physician Assistant

## 2020-02-19 VITALS — Ht 61.0 in | Wt 177.0 lb

## 2020-02-19 DIAGNOSIS — D696 Thrombocytopenia, unspecified: Secondary | ICD-10-CM

## 2020-02-19 DIAGNOSIS — K5909 Other constipation: Secondary | ICD-10-CM

## 2020-02-19 DIAGNOSIS — Z794 Long term (current) use of insulin: Secondary | ICD-10-CM

## 2020-02-19 DIAGNOSIS — F331 Major depressive disorder, recurrent, moderate: Secondary | ICD-10-CM

## 2020-02-19 DIAGNOSIS — E119 Type 2 diabetes mellitus without complications: Secondary | ICD-10-CM

## 2020-02-19 DIAGNOSIS — R1030 Lower abdominal pain, unspecified: Secondary | ICD-10-CM

## 2020-02-19 DIAGNOSIS — K7469 Other cirrhosis of liver: Secondary | ICD-10-CM

## 2020-02-19 MED ORDER — DULOXETINE HCL 20 MG PO CPEP: 20.0000 mg | ORAL_CAPSULE | Freq: Every day | ORAL | 3 refills | Status: DC

## 2020-02-19 NOTE — Telephone Encounter (Signed)
Patient had called the office to change her visit today from an in office to a virtual because she is sick.  Called patient and was advised that she started with real bad body aches last night. Patient stated that she started vomiting during the night. Patient stated that she is not able to come into the office because she feels too bad. . Patient stated that she is unable to check her temperature because she does not have a thermometer. Patient denies sore throat, cough or diarrhea.

## 2020-02-20 NOTE — Assessment & Plan Note (Addendum)
Associated with chills, body aches, vomiting x1. Unclear etiology - ?food poisoning vs other. Anticipate self limited as during video visit these symptoms have largely resolved.  Has cirrhosis history but doubt SBP. Not consistent with diverticulitis or other intra abdominal cause. Not consistent with COVID infection although advised if fever or respiratory symptoms would want her tested.  However reviewed pattern of late cancellations with GI symptoms - she feels these are different causes, asks about FM diagnosis given ongoing body aches - this is a possibility. Will continue to monitor. Reviewed constipation regimen.

## 2020-02-20 NOTE — Assessment & Plan Note (Signed)
Continues PRN miralax.

## 2020-02-20 NOTE — Assessment & Plan Note (Signed)
Stable over the last few years - ?cirrhosis related.

## 2020-02-20 NOTE — Progress Notes (Signed)
Virtual visit completed through MyChart, a video enabled telemedicine application. Due to national recommendations of social distancing due to COVID-19, a virtual visit is felt to be most appropriate for this patient at this time. Reviewed limitations, risks, security and privacy concerns of performing a virtual visit and the availability of in person appointments. I also reviewed that there may be a patient responsible charge related to this service. The patient agreed to proceed.   Patient location: home Provider location: [place] at [facility], office Persons participating in this virtual visit: patient, provider   If any vitals were documented, they were collected by patient at home unless specified below.    Ht 5\' 1"  (1.549 m)   Wt 177 lb (80.3 kg)   BMI 33.44 kg/m    CC: abd pain Subjective:    Patient ID: Karen [name], female    DOB: Jul 04, 1955, 64 y.o.   MRN: [id number]  HPI: Karen [name] is a 64 y.o. female presenting on 02/19/2020 for Abdominal Pain (C/o abd pain, nausea and body aches.  Vomited this morning.  Sxs started last night. )   1d h/o chills and body aches associated with sharp lower abd pain, vomiting x1 this morning. Wonders if it's something she ate. Manages pain with tramadol. Currently not in any pain.   Last BM mild constipation this morning. Appetite is ok.  Denies fevers, diarrhea, blood in stool. No urinary changes.  No cough, dyspnea, congestion symptoms.   Ongoing episodes of significant abd pain, nausea going on for months now. Multiple good days between episodes.  Ozempic started 08/2019, currently on 0.5mg  weekly.   No known COVID exposure.  Wonders about FM (sister has this). No mental fogginess but endorses ongoing fatigue chronic body aches and occasional headaches. Ongoing depression managed with sertraline 50mg  daily.   Needs to schedule f/u with GI Armbruster - due for colonoscopy 04/2020.  Needs to schedule f/u mammogram.   Reviewing  chart, has had several late cancellations due to abd pain and GI symptoms (9/1, 9/14, 9/22, 9/24, 9/29). Several cancellations and no show with cardiology as well.     Relevant past medical, surgical, family and social history reviewed and updated as indicated. Interim medical history since our last visit reviewed. Allergies and medications reviewed and updated. Outpatient Medications Prior to Visit  Medication Sig Dispense Refill  . acetaminophen (TYLENOL) 500 MG tablet Take 1,000 mg by mouth 3 (three) times daily as needed for mild pain.    Marland Kitchen aspirin EC 81 MG EC tablet Take 1 tablet (81 mg total) by mouth daily.    Marland Kitchen dicyclomine (BENTYL) 20 MG tablet Take 1 tablet (20 mg total) by mouth 2 (two) times daily as needed for spasms. 45 tablet 3  . furosemide (LASIX) 20 MG tablet Take 1 tablet (20 mg total) by mouth daily as needed for edema. 30 tablet 3  . gabapentin (NEURONTIN) 300 MG capsule TAKE ONE CAPSULE BY MOUTH AT BEDTIME 90 capsule 3  . insulin detemir (LEVEMIR FLEXTOUCH) 100 UNIT/ML FlexPen Inject 60 Units into the skin in the morning AND 65 Units every evening. 35 mL 0  . Insulin Pen Needle (PEN NEEDLES) 31G X 8 MM MISC Use to inject Lantus as directed twice daily. Dx: E11.40 60 each 3  . insulin regular (NOVOLIN R) 100 units/mL injection Inject 0.2 mLs (20 Units total) into the skin 3 (three) times daily before meals. + sliding scale insulin 5u for every 50 over 200 10 mL 6  .  KLOR-CON SPRINKLE 10 MEQ CR capsule Take 1 capsule (10 mEq total) by mouth daily as needed. When taking Lasix 30 capsule 3  . losartan (COZAAR) 25 MG tablet TAKE ONE TABLET BY MOUTH DAILY 90 tablet 1  . metFORMIN (GLUCOPHAGE-XR) 750 MG 24 hr tablet TAKE TWO TABLETS BY MOUTH EVERY EVENING 60 tablet 1  . nitroGLYCERIN (NITROSTAT) 0.4 MG SL tablet Place 1 tablet (0.4 mg total) under the tongue every 5 (five) minutes as needed for chest pain. 25 tablet 0  . omeprazole (PRILOSEC) 40 MG capsule Take 1 capsule (40 mg  total) by mouth 2 (two) times daily. 60 capsule 11  . polyethylene glycol (MIRALAX / GLYCOLAX) 17 g packet Take 17 g by mouth daily as needed.    . propranolol (INDERAL) 20 MG tablet Take 1 tablet (20 mg total) by mouth 2 (two) times daily. 180 tablet 3  . rosuvastatin (CRESTOR) 5 MG tablet TAKE ONE TABLET BY MOUTH DAILY 90 tablet 1  . Semaglutide,0.25 or 0.5MG /DOS, (OZEMPIC, 0.25 OR 0.5 MG/DOSE,) 2 MG/1.5ML SOPN Inject 0.5 mg into the skin once a week. Inject 0.5mg  weekly    . spironolactone (ALDACTONE) 25 MG tablet TAKE 1/2 TABLET BY MOUTH DAILY 45 tablet 3  . traMADol (ULTRAM) 50 MG tablet Take 1 tablet (50 mg total) by mouth daily as needed for moderate pain. 30 tablet 0  . sertraline (ZOLOFT) 50 MG tablet TAKE ONE TABLET BY MOUTH DAILY**REPLACES CELEXA** 30 tablet 2   Facility-Administered Medications Prior to Visit  Medication Dose Route Frequency Provider Last Rate Last Admin  . 0.9 %  sodium chloride infusion  500 mL Intravenous Once Armbruster, Carlota Raspberry, MD         Per HPI unless specifically indicated in ROS section below Review of Systems Objective:  Ht 5\' 1"  (1.549 m)   Wt 177 lb (80.3 kg)   BMI 33.44 kg/m   Wt Readings from Last 3 Encounters:  02/19/20 177 lb (80.3 kg)  11/12/19 182 lb 1 oz (82.6 kg)  10/28/19 181 lb 3.2 oz (82.2 kg)       Physical exam: Gen: alert, NAD, not ill appearing Pulm: speaks in complete sentences without increased work of breathing Psych: normal mood, normal thought content      Results for orders placed or performed in visit on 11/12/19  Comprehensive metabolic panel  Result Value Ref Range   Sodium 136 135 - 145 mEq/L   Potassium 4.5 3.5 - 5.1 mEq/L   Chloride 102 96 - 112 mEq/L   CO2 26 19 - 32 mEq/L   Glucose, Bld 214 (H) 70 - 99 mg/dL   BUN 15 6 - 23 mg/dL   Creatinine, Ser 0.72 0.40 - 1.20 mg/dL   Total Bilirubin 0.5 0.2 - 1.2 mg/dL   Alkaline Phosphatase 63 39 - 117 U/L   AST 23 0 - 37 U/L   ALT 20 0 - 35 U/L   Total  Protein 7.0 6.0 - 8.3 g/dL   Albumin 4.1 3.5 - 5.2 g/dL   GFR 82.02 >60.00 mL/min   Calcium 9.5 8.4 - 10.5 mg/dL  CBC with Differential/Platelet  Result Value Ref Range   WBC 4.4 4.0 - 10.5 K/uL   RBC 4.60 3.87 - 5.11 Mil/uL   Hemoglobin 13.1 12.0 - 15.0 g/dL   HCT 39.7 36 - 46 %   MCV 86.2 78.0 - 100.0 fl   MCHC 33.0 30.0 - 36.0 g/dL   RDW 15.3 11.5 - 15.5 %  Platelets 104.0 (L) 150 - 400 K/uL   Neutrophils Relative % 61.7 43 - 77 %   Lymphocytes Relative 29.9 12 - 46 %   Monocytes Relative 6.8 3 - 12 %   Eosinophils Relative 1.0 0 - 5 %   Basophils Relative 0.6 0 - 3 %   Neutro Abs 2.7 1.4 - 7.7 K/uL   Lymphs Abs 1.3 0.7 - 4.0 K/uL   Monocytes Absolute 0.3 0.1 - 1.0 K/uL   Eosinophils Absolute 0.0 0 - 0 K/uL   Basophils Absolute 0.0 0 - 0 K/uL  Lipase  Result Value Ref Range   Lipase 32.0 11 - 59 U/L  POCT Urinalysis Dipstick (Automated)  Result Value Ref Range   Color, UA gold    Clarity, UA clear    Glucose, UA Positive (A) Negative   Bilirubin, UA 1+    Ketones, UA +/-    Spec Grav, UA >=1.030 (A) 1.010 - 1.025   Blood, UA negative    pH, UA 5.5 5.0 - 8.0   Protein, UA Positive (A) Negative   Urobilinogen, UA 1.0 0.2 or 1.0 E.U./dL   Nitrite, UA negative    Leukocytes, UA Negative Negative   Lab Results  Component Value Date   HGBA1C 8.4 (A) 10/28/2019    Assessment & Plan:   Problem List Items Addressed This Visit    Thrombocytopenia ([place])    Stable over the last few years - ?cirrhosis related.       MDD (major depressive disorder), recurrent episode, moderate ([place])    ?FM - will transition from sertraline to cymbalta (duloxetine) 20mg  daily.       Relevant Medications   DULoxetine (CYMBALTA) 20 MG capsule   Lower abdominal pain - Primary    Associated with chills, body aches, vomiting x1. Unclear etiology - ?food poisoning vs other. Anticipate self limited as during video visit these symptoms have largely resolved.  Has cirrhosis history but doubt  SBP. Not consistent with diverticulitis or other intra abdominal cause. Not consistent with COVID infection although advised if fever or respiratory symptoms would want her tested.  However reviewed pattern of late cancellations with GI symptoms - she feels these are different causes, asks about FM diagnosis given ongoing body aches - this is a possibility. Will continue to monitor. Reviewed constipation regimen.       Insulin dependent type 2 diabetes mellitus (HCC)    Latest A1c showed improvement. Due for f/u. Will return for this.       Cirrhosis of liver ([place])    New diagnosis last year. GI f/u limited due to cost. Will call to schedule f/u.  No fever, abd distension to suggest SBP.       Chronic constipation    Continues PRN miralax.          Meds ordered this encounter  Medications  . DULoxetine (CYMBALTA) 20 MG capsule    Sig: Take 1 capsule (20 mg total) by mouth daily.    Dispense:  30 capsule    Refill:  3    To replace sertraline   No orders of the defined types were placed in this encounter.   I discussed the assessment and treatment plan with the patient. The patient was provided an opportunity to ask questions and all were answered. The patient agreed with the plan and demonstrated an understanding of the instructions. The patient was advised to call back or seek an in-person evaluation if the symptoms worsen or if  the condition fails to improve as anticipated.  Follow up plan: No follow-ups on file.  [name], MD

## 2020-02-20 NOTE — Assessment & Plan Note (Signed)
?  FM - will transition from sertraline to cymbalta (duloxetine) 20mg  daily.

## 2020-02-20 NOTE — Assessment & Plan Note (Addendum)
New diagnosis last year. GI f/u limited due to cost. Will call to schedule f/u.  No fever, abd distension to suggest SBP.

## 2020-02-20 NOTE — Assessment & Plan Note (Signed)
Latest A1c showed improvement. Due for f/u. Will return for this.

## 2020-02-24 ENCOUNTER — Ambulatory Visit: Payer: Self-pay | Admitting: Physician Assistant

## 2020-02-27 ENCOUNTER — Ambulatory Visit (HOSPITAL_COMMUNITY): Payer: Self-pay

## 2020-03-09 ENCOUNTER — Telehealth: Payer: Self-pay | Admitting: Physician Assistant

## 2020-03-11 ENCOUNTER — Other Ambulatory Visit: Payer: Self-pay

## 2020-03-11 ENCOUNTER — Encounter: Payer: Self-pay | Admitting: Family Medicine

## 2020-03-11 ENCOUNTER — Ambulatory Visit (INDEPENDENT_AMBULATORY_CARE_PROVIDER_SITE_OTHER): Payer: Self-pay | Admitting: Family Medicine

## 2020-03-11 VITALS — BP 124/66 | HR 74 | Temp 97.9°F | Ht 61.0 in | Wt 181.6 lb

## 2020-03-11 DIAGNOSIS — E119 Type 2 diabetes mellitus without complications: Secondary | ICD-10-CM

## 2020-03-11 DIAGNOSIS — K7469 Other cirrhosis of liver: Secondary | ICD-10-CM

## 2020-03-11 DIAGNOSIS — E785 Hyperlipidemia, unspecified: Secondary | ICD-10-CM

## 2020-03-11 DIAGNOSIS — E1169 Type 2 diabetes mellitus with other specified complication: Secondary | ICD-10-CM

## 2020-03-11 DIAGNOSIS — D696 Thrombocytopenia, unspecified: Secondary | ICD-10-CM

## 2020-03-11 DIAGNOSIS — E113399 Type 2 diabetes mellitus with moderate nonproliferative diabetic retinopathy without macular edema, unspecified eye: Secondary | ICD-10-CM

## 2020-03-11 DIAGNOSIS — Z794 Long term (current) use of insulin: Secondary | ICD-10-CM

## 2020-03-11 DIAGNOSIS — Z23 Encounter for immunization: Secondary | ICD-10-CM

## 2020-03-11 LAB — POCT GLYCOSYLATED HEMOGLOBIN (HGB A1C): Hemoglobin A1C: 8.5 % — AB (ref 4.0–5.6)

## 2020-03-11 NOTE — Progress Notes (Signed)
This visit was conducted in person.  BP 124/66 (BP Location: Left Arm, Patient Position: Sitting, Cuff Size: Normal)   Pulse 74   Temp 97.9 F (36.6 C) (Temporal)   Ht 5\' 1"  (1.549 m)   Wt 181 lb 9 oz (82.4 kg)   SpO2 95%   BMI 34.31 kg/m    CC: DM f/u visit  Subjective:    Patient ID: Karen [name], female    DOB: Feb 14, 1956, 64 y.o.   MRN: [id number]  HPI: Karen [name] is a 64 y.o. female presenting on 03/11/2020 for Diabetes (Here for f/u.)   Picked up novolin R and pen needles today.   Working on disability - pending medical exam next month (90% complete).   DM - does regularly check sugars 4 times a day - didn't bring log - higher sugars recently due to eating more sweets. Compliant with antihyperglycemic regimen which includes: levemir 60u in am and 65u in pm, novolin R 20u TID AC + SSI 5u for 50 over 200 (she increased dose by 5u so starting dose is 25u TID AC), metformin XR 1500 in evenings, ozempic 0.5mg  weekly. No diarrhea/loose stools despite metformin. Denies low sugars or hypoglycemic symptoms. Occasional paresthesias - known neuropathy. Last diabetic eye exam DUE. Pneumovax: 2015. Prevnar: not due. Glucometer brand: unknown. DSME: DUE. Lab Results  Component Value Date   HGBA1C 8.5 (A) 03/11/2020   Diabetic Foot Exam - Simple   Simple Foot Form Diabetic Foot exam was performed with the following findings: Yes 03/11/2020 11:27 AM  Visual Inspection No deformities, no ulcerations, no other skin breakdown bilaterally: Yes Sensation Testing See comments: Yes Pulse Check Posterior Tibialis and Dorsalis pulse intact bilaterally: Yes Comments Diminished to monofilament testing    Lab Results  Component Value Date   MICROALBUR 3.3 (H) 07/02/2015        Relevant past medical, surgical, family and social history reviewed and updated as indicated. Interim medical history since our last visit reviewed. Allergies and medications reviewed and updated. Outpatient  Medications Prior to Visit  Medication Sig Dispense Refill  . acetaminophen (TYLENOL) 500 MG tablet Take 1,000 mg by mouth 3 (three) times daily as needed for mild pain.    Marland Kitchen aspirin EC 81 MG EC tablet Take 1 tablet (81 mg total) by mouth daily.    Marland Kitchen dicyclomine (BENTYL) 20 MG tablet Take 1 tablet (20 mg total) by mouth 2 (two) times daily as needed for spasms. 45 tablet 3  . DULoxetine (CYMBALTA) 20 MG capsule Take 1 capsule (20 mg total) by mouth daily. 30 capsule 3  . furosemide (LASIX) 20 MG tablet Take 1 tablet (20 mg total) by mouth daily as needed for edema. 30 tablet 3  . gabapentin (NEURONTIN) 300 MG capsule TAKE ONE CAPSULE BY MOUTH AT BEDTIME 90 capsule 3  . insulin detemir (LEVEMIR FLEXTOUCH) 100 UNIT/ML FlexPen Inject 60 Units into the skin in the morning AND 65 Units every evening. 35 mL 0  . Insulin Pen Needle (PEN NEEDLES) 31G X 8 MM MISC Use to inject Lantus as directed twice daily. Dx: E11.40 60 each 3  . insulin regular (NOVOLIN R) 100 units/mL injection Inject 0.25 mLs (25 Units total) into the skin 3 (three) times daily before meals. + sliding scale insulin 5u for every 50 over 200 10 mL   . KLOR-CON SPRINKLE 10 MEQ CR capsule Take 1 capsule (10 mEq total) by mouth daily as needed. When taking Lasix 30 capsule 3  .  losartan (COZAAR) 25 MG tablet TAKE ONE TABLET BY MOUTH DAILY 90 tablet 1  . metFORMIN (GLUCOPHAGE-XR) 750 MG 24 hr tablet TAKE TWO TABLETS BY MOUTH EVERY EVENING 60 tablet 1  . nitroGLYCERIN (NITROSTAT) 0.4 MG SL tablet Place 1 tablet (0.4 mg total) under the tongue every 5 (five) minutes as needed for chest pain. 25 tablet 0  . omeprazole (PRILOSEC) 40 MG capsule Take 1 capsule (40 mg total) by mouth 2 (two) times daily. 60 capsule 11  . polyethylene glycol (MIRALAX / GLYCOLAX) 17 g packet Take 17 g by mouth daily as needed.    . propranolol (INDERAL) 20 MG tablet Take 1 tablet (20 mg total) by mouth 2 (two) times daily. 180 tablet 3  . rosuvastatin (CRESTOR) 5 MG  tablet TAKE ONE TABLET BY MOUTH DAILY 90 tablet 1  . Semaglutide,0.25 or 0.5MG /DOS, (OZEMPIC, 0.25 OR 0.5 MG/DOSE,) 2 MG/1.5ML SOPN Inject 0.5 mg into the skin once a week. Inject 0.5mg  weekly    . spironolactone (ALDACTONE) 25 MG tablet TAKE 1/2 TABLET BY MOUTH DAILY 45 tablet 3  . traMADol (ULTRAM) 50 MG tablet Take 1 tablet (50 mg total) by mouth daily as needed for moderate pain. 30 tablet 0  . insulin regular (NOVOLIN R) 100 units/mL injection Inject 0.2 mLs (20 Units total) into the skin 3 (three) times daily before meals. + sliding scale insulin 5u for every 50 over 200 10 mL 6   Facility-Administered Medications Prior to Visit  Medication Dose Route Frequency Provider Last Rate Last Admin  . 0.9 %  sodium chloride infusion  500 mL Intravenous Once Armbruster, Carlota Raspberry, MD         Per HPI unless specifically indicated in ROS section below Review of Systems Objective:  BP 124/66 (BP Location: Left Arm, Patient Position: Sitting, Cuff Size: Normal)   Pulse 74   Temp 97.9 F (36.6 C) (Temporal)   Ht 5\' 1"  (1.549 m)   Wt 181 lb 9 oz (82.4 kg)   SpO2 95%   BMI 34.31 kg/m   Wt Readings from Last 3 Encounters:  03/11/20 181 lb 9 oz (82.4 kg)  02/19/20 177 lb (80.3 kg)  11/12/19 182 lb 1 oz (82.6 kg)      Physical Exam Vitals and nursing note reviewed.  Constitutional:      Appearance: Normal appearance. She is obese. She is not ill-appearing.  Eyes:     Extraocular Movements: Extraocular movements intact.     Pupils: Pupils are equal, round, and reactive to light.  Cardiovascular:     Rate and Rhythm: Normal rate and regular rhythm.     Pulses: Normal pulses.     Heart sounds: Normal heart sounds. No murmur heard.   Pulmonary:     Effort: Pulmonary effort is normal. No respiratory distress.     Breath sounds: Normal breath sounds. No wheezing, rhonchi or rales.  Musculoskeletal:     Right lower leg: No edema.     Left lower leg: No edema.  Skin:    General: Skin is  warm and dry.     Findings: No rash.  Neurological:     Mental Status: She is alert.  Psychiatric:        Mood and Affect: Mood normal.        Behavior: Behavior normal.       Results for orders placed or performed in visit on 03/11/20  POCT glycosylated hemoglobin (Hb A1C)  Result Value Ref Range  Hemoglobin A1C 8.5 (A) 4.0 - 5.6 %   HbA1c POC (<> result, manual entry)     HbA1c, POC (prediabetic range)     HbA1c, POC (controlled diabetic range)     Lab Results  Component Value Date   CHOL 181 04/10/2019   HDL 39.60 04/10/2019   LDLCALC UNABLE TO CALCULATE IF TRIGLYCERIDE OVER 400 mg/dL 07/24/2013   LDLDIRECT 89.0 04/10/2019   TRIG 206.0 (H) 04/10/2019   CHOLHDL 5 04/10/2019   Assessment & Plan:  This visit occurred during the SARS-CoV-2 public health emergency.  Safety protocols were in place, including screening questions prior to the visit, additional usage of staff PPE, and extensive cleaning of exam room while observing appropriate contact time as indicated for disinfecting solutions.   Problem List Items Addressed This Visit    Thrombocytopenia ([place])    Anticipate cirrhosis related.       Insulin dependent type 2 diabetes mellitus ([place]) - Primary    Stable control based on today's A1c. Admits to dietary liberties (candies). Encouraged renewed effort to follow diabetic diet. Continue current regimen. Reassess at CPE in 3-4 months.  Forgot to bring sugar log - so difficult to dose insulin accordingly - she states she will forward recent sugar readings through MyChart for my review.       Relevant Medications   insulin regular (NOVOLIN R) 100 units/mL injection   Other Relevant Orders   POCT glycosylated hemoglobin (Hb A1C) (Completed)   Dyslipidemia associated with type 2 diabetes mellitus ([place])    Check FLP at f/u appt, continue crestor       Relevant Medications   insulin regular (NOVOLIN R) 100 units/mL injection   Diabetic retinopathy ([place])    Overdue for  f/u - has been unable to afford eye exam this year.       Relevant Medications   insulin regular (NOVOLIN R) 100 units/mL injection   Cirrhosis of liver ([place])    Other Visit Diagnoses    Need for influenza vaccination       Relevant Orders   Flu Vaccine QUAD 36+ mos IM (Completed)       No orders of the defined types were placed in this encounter.  Orders Placed This Encounter  Procedures  . Flu Vaccine QUAD 36+ mos IM  . POCT glycosylated hemoglobin (Hb A1C)    Patient Instructions  Flu shot today  Call to schedule repeat mammogram  Ok to get COVID booster. Watch sugars/sweets especially around holidays.  Return as needed or in 3-4 months for physical.    Follow up plan: Return in about 3 months (around 06/11/2020) for annual exam, prior fasting for blood work.  [name], MD

## 2020-03-11 NOTE — Patient Instructions (Addendum)
Flu shot today  Call to schedule repeat mammogram  Ok to get COVID booster. Watch sugars/sweets especially around holidays.  Return as needed or in 3-4 months for physical.

## 2020-03-11 NOTE — Assessment & Plan Note (Addendum)
Stable control based on today's A1c. Admits to dietary liberties (candies). Encouraged renewed effort to follow diabetic diet. Continue current regimen. Reassess at CPE in 3-4 months.  Forgot to bring sugar log - so difficult to dose insulin accordingly - she states she will forward recent sugar readings through MyChart for my review.

## 2020-03-11 NOTE — Assessment & Plan Note (Signed)
Overdue for f/u - has been unable to afford eye exam this year.

## 2020-03-11 NOTE — Assessment & Plan Note (Signed)
Anticipate cirrhosis related.

## 2020-03-11 NOTE — Assessment & Plan Note (Addendum)
Check FLP at f/u appt, continue crestor

## 2020-03-20 ENCOUNTER — Other Ambulatory Visit: Payer: Self-pay

## 2020-03-20 NOTE — Telephone Encounter (Signed)
Filled and in Lisa's box 

## 2020-03-20 NOTE — Telephone Encounter (Signed)
Received faxed refill request form from [employer].  Placed form in Dr.  Synthia Innocent box.

## 2020-03-23 ENCOUNTER — Other Ambulatory Visit (HOSPITAL_COMMUNITY): Payer: Self-pay

## 2020-03-23 NOTE — Telephone Encounter (Signed)
Faxed form.

## 2020-03-27 ENCOUNTER — Ambulatory Visit: Payer: Self-pay | Admitting: Cardiology

## 2020-04-02 ENCOUNTER — Telehealth: Payer: Self-pay

## 2020-04-02 NOTE — Telephone Encounter (Signed)
Received pt's Ozempic shipment (5- bxs).  Notified pt via [place].  [Placed Ozeimpic (red label) in 1st refrigerator, 5th shelf.]

## 2020-04-15 ENCOUNTER — Other Ambulatory Visit (HOSPITAL_COMMUNITY): Payer: Self-pay

## 2020-04-17 ENCOUNTER — Encounter: Payer: Self-pay | Admitting: Family Medicine

## 2020-04-17 NOTE — Telephone Encounter (Signed)
Updated pt's chart.  

## 2020-04-21 NOTE — Progress Notes (Deleted)
CARDIOLOGY OFFICE NOTE  Date:  04/21/2020    Karen [name] Date of Birth: 1956/04/12 Medical Record #[id number]  PCP:  [name], MD  Cardiologist:  Clearence Cheek    No chief complaint on file.   History of Present Illness: Karen [name] is a 64 y.o. female who presents today for a follow up visit. Seen for Dr. Tamala Julian.   She has a history of known CAD with RCA and RI DES 2015, history of SVT status post ablation in May 2017, DM II, hyperlipidemia, cirrhosis (non alcoholic), and essential hypertension.  Last seen here in March - had had increasing SOB since starting Propranolol. Also with lower extremity edema. Occasional palpitations but nothing similar to her prior PSVT. Looks like echo was ordered but not completed.    Comes in today. Here with   Past Medical History:  Diagnosis Date  . Body aches 03/24/2016   Resolved off simvastatin.   Marland Kitchen CAD (coronary artery disease)    STEMI 07/2013 - treated with PTCA of the RCA followed by DES to the intermediate ramus  //  Myoview 5/17 - Normal perfusion. LVEF 71% with normal wall motion. This is a low risk study.  . Chronic bronchitis   . Cirrhosis ([place])   . Depression   . Diabetes ([place])   . GERD (gastroesophageal reflux disease)   . High blood pressure   . History of chicken pox   . History of ovarian cyst   . HLD (hyperlipidemia)   . Seasonal allergies   . Seizure ([place]) 1977   off AEDs  . SVT (supraventricular tachycardia) ([place]) 10/2015   s/p catheter ablation  . Syncopal episodes   . Tachyarrhythmia    ER visits with adenosine push to control  . Tubal pregnancy   . Type 2 diabetes, uncontrolled, with neuropathy ([place])    dx 1993    Past Surgical History:  Procedure Laterality Date  . ABLATION OF DYSRHYTHMIC FOCUS  11/06/2015   SVT  . CARDIAC CATHETERIZATION  07/2013   angioplasty to RCA and DES to ramus intermedius  . COLONOSCOPY WITH ESOPHAGOGASTRODUODENOSCOPY (EGD)  04/2019   esophageal varices II,  portal hypertensive gastritis, gastric polyp, H pylori neg, 12 TAs, rpt colonoscopy in 1 yr ([facility])  . DILATION AND CURETTAGE OF UTERUS  1984  . ECTOPIC PREGNANCY SURGERY Right 1984   tube removal 2/2 hemorrhage  . ELECTROPHYSIOLOGIC STUDY N/A 11/06/2015   SVT Ablation;  Evans Lance, MD;  Location: [facility]  . LEFT HEART CATHETERIZATION WITH CORONARY ANGIOGRAM N/A 07/23/2013   Procedure: LEFT HEART CATHETERIZATION WITH CORONARY ANGIOGRAM;  Surgeon: Sinclair Grooms, MD;  Location: [facility] CATH LAB;  Service: Cardiovascular;  Laterality: N/A;  . OVARIAN CYST SURGERY  1998  . PERCUTANEOUS CORONARY STENT INTERVENTION (PCI-S) N/A 07/24/2013   Procedure: PERCUTANEOUS CORONARY STENT INTERVENTION (PCI-S);  Surgeon: Sinclair Grooms, MD;  Location: [facility] CATH LAB;  Service: Cardiovascular;  Laterality: N/A;  . ROTATOR CUFF REPAIR  2006   Right  . SHOULDER ARTHROSCOPY Left 02/2017   LEFT shoulder arthroscopy with limited debridement, decompression/acromioplasty, open rotator cuff repair and biceps tenodesis ([facility])  . SUBMANDIBULAR DUCT LIGATION Right 10/2014   R submandibular gland stone removed [facility])  . TONSILLECTOMY AND ADENOIDECTOMY  1965     Medications: No outpatient medications have been marked as taking for the 05/05/20 encounter (Appointment) with Burtis Junes, NP.   Current Facility-Administered Medications for the 05/05/20 encounter (  Appointment) with Burtis Junes, NP  Medication  . 0.9 %  sodium chloride infusion     Allergies: Allergies  Allergen Reactions  . Clindamycin/Lincomycin Nausea And Vomiting and Other (See Comments)    Pt refuses to retry  . Erythromycin Other (See Comments)    Stomach cramps  . Lipitor [Atorvastatin] Nausea Only  . Penicillins Rash and Other (See Comments)    Has patient had a PCN reaction causing immediate rash, facial/tongue/throat swelling, SOB or lightheadedness with hypotension: Yes Has patient had a PCN reaction  causing severe rash involving mucus membranes or skin necrosis: No Has patient had a PCN reaction that required hospitalization No Has patient had a PCN reaction occurring within the last 10 years: No If all of the above answers are "NO", then may proceed with Cephalosporin use.      Social History: The patient  reports that she has never smoked. She has never used smokeless tobacco. She reports that she does not drink alcohol and does not use drugs.   Family History: The patient's ***family history includes Arrhythmia in her brother; Breast cancer in her paternal aunt; Cirrhosis in her maternal grandmother; Congestive Heart Failure in her father; Coronary artery disease in her father and mother; Diabetes in her brother, brother, father, maternal grandfather, maternal grandmother, mother, paternal grandfather, paternal grandmother, and sister; Heart disease in her brother and mother; Hyperlipidemia in her father and mother; Hypertension in her father and mother; Pancreatic cancer in her father; Stroke in her mother.   Review of Systems: Please see the history of present illness.   All other systems are reviewed and negative.   Physical Exam: VS:  There were no vitals taken for this visit. Marland Kitchen  BMI There is no height or weight on file to calculate BMI.  Wt Readings from Last 3 Encounters:  03/11/20 181 lb 9 oz (82.4 kg)  02/19/20 177 lb (80.3 kg)  11/12/19 182 lb 1 oz (82.6 kg)    General: Pleasant. Well developed, well nourished and in no acute distress.   HEENT: Normal.  Neck: Supple, no JVD, carotid bruits, or masses noted.  Cardiac: ***Regular rate and rhythm. No murmurs, rubs, or gallops. No edema.  Respiratory:  Lungs are clear to auscultation bilaterally with normal work of breathing.  GI: Soft and nontender.  MS: No deformity or atrophy. Gait and ROM intact.  Skin: Warm and dry. Color is normal.  Neuro:  Strength and sensation are intact and no gross focal deficits noted.   Psych: Alert, appropriate and with normal affect.   LABORATORY DATA:  EKG:  EKG {ACTION; IS/IS [id number]:[id number]} ordered today.  Personally reviewed by me. This demonstrates ***.  Lab Results  Component Value Date   WBC 4.4 11/12/2019   HGB 13.1 11/12/2019   HCT 39.7 11/12/2019   PLT 104.0 (L) 11/12/2019   GLUCOSE 214 (H) 11/12/2019   CHOL 181 04/10/2019   TRIG 206.0 (H) 04/10/2019   HDL 39.60 04/10/2019   LDLDIRECT 89.0 04/10/2019   LDLCALC UNABLE TO CALCULATE IF TRIGLYCERIDE OVER 400 mg/dL 07/24/2013   ALT 20 11/12/2019   AST 23 11/12/2019   NA 136 11/12/2019   K 4.5 11/12/2019   CL 102 11/12/2019   CREATININE 0.72 11/12/2019   BUN 15 11/12/2019   CO2 26 11/12/2019   TSH 2.010 07/22/2019   INR 1.0 04/10/2019   HGBA1C 8.5 (A) 03/11/2020   MICROALBUR 3.3 (H) 07/02/2015     BNP (last 3 results) No results for input(s):  BNP in the last 8760 hours.  ProBNP (last 3 results) Recent Labs    07/22/19 1037  PROBNP 147     Other Studies Reviewed Today:       ASSESSMENT & PLAN:     1. CAD  2. PSVT  3. HTN  4. HLD  5. DM    In order of problems listed above:   1. No symptoms to suggest angina.  Secondary prevention is discussed.  I guess there is a possibility that dyspnea on exertion could be an anginal equivalent. 2. No current symptoms to suggest PSVT. 3. BP is well controlled on current therapy. 4. Most recent LDL cholesterol was not calculated because of extremely elevated triglyceride.  LDL target should be less than 70 given diabetes. 5. Target A1c less than 7.  This will be checked today. 6. She does not know the etiology of cirrhosis.  She denies alcohol.  Short of breath since starting propranolol.  I am unable to ascertain if there is ascites on exam.  Dr. Havery Moros is GI. 7. 3W's is being practiced. 8. 2D Doppler echocardiogram to assess right ventricular function and diastolic parameters.  Rule out pericardial effusion.  BNP will also be  obtained along with a TSH.   Overall goal may be to decrease volume by adding diuretic therapy such as spironolactone given cirrhosis.  We will get additional information as noted above that will include BNP, comprehensive metabolic panel, CBC, TSH, and hemoglobin A1c     Current medicines are reviewed with the patient today.  The patient does not have concerns regarding medicines other than what has been noted above.  The following changes have been made:  See above.  Labs/ tests ordered today include:   No orders of the defined types were placed in this encounter.    Disposition:   FU with *** in {gen number 2-83:[id number]} {Days to years:10300}.   Patient is agreeable to this plan and will call if any problems develop in the interim.   SignedTruitt Merle, NP  04/21/2020 7:38 AM  [facility] [address] [place], [place]  [address] Phone: [phone] Fax: [phone]

## 2020-04-23 ENCOUNTER — Other Ambulatory Visit: Payer: Self-pay | Admitting: Family Medicine

## 2020-05-01 ENCOUNTER — Telehealth: Payer: Self-pay

## 2020-05-01 NOTE — Telephone Encounter (Signed)
Received pt's Novolin R insulin (12 bxs) shipment from [employer].  Notified pt via [place].  [Placed shipment in 1st refrigerator, 5th shelf]

## 2020-05-02 ENCOUNTER — Other Ambulatory Visit: Payer: Self-pay | Admitting: Family Medicine

## 2020-05-04 ENCOUNTER — Telehealth: Payer: Self-pay | Admitting: Family Medicine

## 2020-05-04 NOTE — Telephone Encounter (Signed)
Pharmacy requests refill on: Omeprazole 40 mg   LAST REFILL: 04/29/2019 (Q-60, R-11)  LAST OV: 03/11/2020 NEXT OV: 06/15/2020 PHARMACY: [facility] [address], [place]

## 2020-05-04 NOTE — Telephone Encounter (Signed)
Pt called in wanted to let lisa know to reorder levemir insulin flexpen

## 2020-05-05 ENCOUNTER — Ambulatory Visit: Payer: Self-pay | Admitting: Nurse Practitioner

## 2020-05-05 NOTE — Telephone Encounter (Signed)
Filled and in Lisa's box 

## 2020-05-05 NOTE — Telephone Encounter (Signed)
Placed order in Dr. G's box.  

## 2020-05-05 NOTE — Telephone Encounter (Signed)
Faxed order

## 2020-05-20 ENCOUNTER — Telehealth: Payer: Self-pay | Admitting: Family Medicine

## 2020-05-20 ENCOUNTER — Other Ambulatory Visit (HOSPITAL_COMMUNITY): Payer: Self-pay

## 2020-05-20 NOTE — Telephone Encounter (Signed)
Patient is calling in wanting to know if the following has made it to our office yet: Levemir If not can we see when it will be here and inform the patient. Thank you, EM

## 2020-05-21 ENCOUNTER — Encounter: Payer: Self-pay | Admitting: Gastroenterology

## 2020-05-21 NOTE — Telephone Encounter (Signed)
10 boxes of Levemir came in this morning.  Pt called and was advised all medication is here for pick up. Pt reported she was finally approved for disability on 05/13/20 and is now fully covered by insurance and this will be the last medication she will need from mail order.

## 2020-05-21 NOTE — Telephone Encounter (Signed)
Pt picked up levemir, ozempic, and novolin R.   Levemir, NDC [id number], lot # [id number], exp 08/14/22 Novolin R, NDC [phone], lot# [id number], exp 05/15/22 Ozempic, NDC [phone], [id number], Lot# [id number], exp 4/31/28

## 2020-05-21 NOTE — Telephone Encounter (Signed)
Please see previous note. Patient is calling back for the second time. The patient is on her last day of insulin. EM

## 2020-05-22 NOTE — Telephone Encounter (Signed)
Glad to hear.  Thanks.  

## 2020-05-25 ENCOUNTER — Telehealth: Payer: Self-pay

## 2020-05-25 NOTE — Telephone Encounter (Signed)
Pt missed pv appt, call and lm for pt to call by 5pm today to r/s pv or I will cancel pv and colonoscopy and pt can call to r/s at another time.

## 2020-05-28 ENCOUNTER — Telehealth: Payer: Self-pay | Admitting: Family Medicine

## 2020-05-28 NOTE — Telephone Encounter (Signed)
Patient is scheduled for an appt tomorrow for pain due to dental work. Patient is calling in asking for pain meds until tomorrow's appt. Patient states that tylenol and tramadol are not working. Please advise patient EM

## 2020-05-29 ENCOUNTER — Other Ambulatory Visit: Payer: Self-pay | Admitting: Family Medicine

## 2020-05-29 ENCOUNTER — Encounter: Payer: Self-pay | Admitting: Family Medicine

## 2020-05-29 ENCOUNTER — Ambulatory Visit (INDEPENDENT_AMBULATORY_CARE_PROVIDER_SITE_OTHER): Payer: HMO | Admitting: Family Medicine

## 2020-05-29 ENCOUNTER — Other Ambulatory Visit: Payer: Self-pay

## 2020-05-29 VITALS — BP 120/62 | HR 76 | Temp 97.9°F | Ht 61.0 in | Wt 176.6 lb

## 2020-05-29 DIAGNOSIS — Z9861 Coronary angioplasty status: Secondary | ICD-10-CM | POA: Diagnosis not present

## 2020-05-29 DIAGNOSIS — K0889 Other specified disorders of teeth and supporting structures: Secondary | ICD-10-CM

## 2020-05-29 DIAGNOSIS — I2510 Atherosclerotic heart disease of native coronary artery without angina pectoris: Secondary | ICD-10-CM | POA: Diagnosis not present

## 2020-05-29 DIAGNOSIS — K7469 Other cirrhosis of liver: Secondary | ICD-10-CM

## 2020-05-29 MED ORDER — ROSUVASTATIN CALCIUM 5 MG PO TABS
5.0000 mg | ORAL_TABLET | Freq: Every day | ORAL | 1 refills | Status: DC
Start: 2020-05-29 — End: 2020-07-03

## 2020-05-29 MED ORDER — HYDROCODONE-ACETAMINOPHEN 10-325 MG PO TABS
0.5000 | ORAL_TABLET | Freq: Two times a day (BID) | ORAL | 0 refills | Status: AC | PRN
Start: 2020-05-29 — End: 2020-06-03

## 2020-05-29 MED ORDER — LOSARTAN POTASSIUM 25 MG PO TABS
25.0000 mg | ORAL_TABLET | Freq: Every day | ORAL | 1 refills | Status: DC
Start: 2020-05-29 — End: 2020-08-24

## 2020-05-29 NOTE — Progress Notes (Signed)
Patient ID: Karen [name], female    DOB: 06-Feb-1956, 65 y.o.   MRN: [id number]  This visit was conducted in person.  BP 120/62 (BP Location: Left Arm, Patient Position: Sitting, Cuff Size: Normal)   Pulse 76   Temp 97.9 F (36.6 C) (Temporal)   Ht 5\' 1"  (1.549 m)   Wt 176 lb 9 oz (80.1 kg)   SpO2 96%   BMI 33.36 kg/m    CC: dental pain  Subjective:   HPI: [name] is a 65 y.o. female presenting on 05/29/2020 for Dental Pain (C/o tooth pain, mainly in front and on left side mouth.  Started about 1 wk ago.  Seen by oral surgeon yesterday and given abx to take before oral surgery on 06/11/20. )   Chronic dental issues. 1 wk h/o dental pain saw oral surgeon (Dr Ladell Pier) and prescribed antibiotic (amoxicillin) to start 06/10/2020 for planned oral surgery 06/11/2020. Planned tooth extraction. "it feels like a sledgehammer to the face".   Treating pain with tylenol 1000mg  TID, tramadol without benefit. Avoids NSAIDs due to cardiac and GI history. She states hydrocodone 10/325mg  worked well for her in the past.      Relevant past medical, surgical, family and social history reviewed and updated as indicated. Interim medical history since our last visit reviewed. Allergies and medications reviewed and updated. Outpatient Medications Prior to Visit  Medication Sig Dispense Refill  . acetaminophen (TYLENOL) 500 MG tablet Take 1,000 mg by mouth 3 (three) times daily as needed for mild pain.    Marland Kitchen aspirin EC 81 MG EC tablet Take 1 tablet (81 mg total) by mouth daily.    Marland Kitchen dicyclomine (BENTYL) 20 MG tablet Take 1 tablet (20 mg total) by mouth 2 (two) times daily as needed for spasms. 45 tablet 3  . DULoxetine (CYMBALTA) 20 MG capsule Take 1 capsule (20 mg total) by mouth daily. 30 capsule 3  . furosemide (LASIX) 20 MG tablet Take 1 tablet (20 mg total) by mouth daily as needed for edema. 30 tablet 3  . gabapentin (NEURONTIN) 300 MG capsule TAKE ONE CAPSULE BY MOUTH AT BEDTIME 90 capsule 3  .  insulin detemir (LEVEMIR FLEXTOUCH) 100 UNIT/ML FlexPen Inject 60 Units into the skin in the morning AND 65 Units every evening. 35 mL 0  . Insulin Pen Needle (PEN NEEDLES) 31G X 8 MM MISC Use to inject Lantus as directed twice daily. Dx: E11.40 60 each 3  . insulin regular (NOVOLIN R) 100 units/mL injection Inject 0.25 mLs (25 Units total) into the skin 3 (three) times daily before meals. + sliding scale insulin 5u for every 50 over 200 10 mL   . KLOR-CON SPRINKLE 10 MEQ CR capsule Take 1 capsule (10 mEq total) by mouth daily as needed. When taking Lasix 30 capsule 3  . metFORMIN (GLUCOPHAGE-XR) 750 MG 24 hr tablet TAKE TWO TABLETS BY MOUTH EVERY EVENING 60 tablet 1  . nitroGLYCERIN (NITROSTAT) 0.4 MG SL tablet Place 1 tablet (0.4 mg total) under the tongue every 5 (five) minutes as needed for chest pain. 25 tablet 0  . omeprazole (PRILOSEC) 40 MG capsule TAKE ONE CAPSULE BY MOUTH TWICE A DAY 60 capsule 5  . polyethylene glycol (MIRALAX / GLYCOLAX) 17 g packet Take 17 g by mouth daily as needed.    . propranolol (INDERAL) 20 MG tablet Take 1 tablet (20 mg total) by mouth 2 (two) times daily. 180 tablet 3  . Semaglutide,0.25 or 0.5MG /DOS, ([place],  0.25 OR 0.5 MG/DOSE,) 2 MG/1.5ML SOPN Inject 0.5 mg into the skin once a week. Inject 0.5mg  weekly    . spironolactone (ALDACTONE) 25 MG tablet TAKE 1/2 TABLET BY MOUTH DAILY 45 tablet 3  . traMADol (ULTRAM) 50 MG tablet Take 1 tablet (50 mg total) by mouth daily as needed for moderate pain. 30 tablet 0  . losartan (COZAAR) 25 MG tablet TAKE ONE TABLET BY MOUTH DAILY 90 tablet 1  . rosuvastatin (CRESTOR) 5 MG tablet TAKE ONE TABLET BY MOUTH DAILY 90 tablet 1   Facility-Administered Medications Prior to Visit  Medication Dose Route Frequency Provider Last Rate Last Admin  . 0.9 %  sodium chloride infusion  500 mL Intravenous Once [facility], Carlota Raspberry, MD         Per HPI unless specifically indicated in ROS section below Review of Systems Objective:   BP 120/62 (BP Location: Left Arm, Patient Position: Sitting, Cuff Size: Normal)   Pulse 76   Temp 97.9 F (36.6 C) (Temporal)   Ht 5\' 1"  (1.549 m)   Wt 176 lb 9 oz (80.1 kg)   SpO2 96%   BMI 33.36 kg/m   Wt Readings from Last 3 Encounters:  05/29/20 176 lb 9 oz (80.1 kg)  03/11/20 181 lb 9 oz (82.4 kg)  02/19/20 177 lb (80.3 kg)      Physical Exam Vitals and nursing note reviewed.  Constitutional:      Appearance: Normal appearance. She is not ill-appearing.  HENT:     Head:     Comments: No jaw pain, no cervical lymphadenopathy    Mouth/Throat:     Mouth: Mucous membranes are moist.     Dentition: Abnormal dentition. Does not have dentures. Dental tenderness and dental caries present. No dental abscesses or gum lesions.     Pharynx: Oropharynx is clear. No oropharyngeal exudate.     Comments: Poor dentition throughout with broken and missing teeth, point tender to palpation at upper incisors Neurological:     Mental Status: She is alert.       Results for orders placed or performed in visit on 03/11/20  POCT glycosylated hemoglobin (Hb A1C)  Result Value Ref Range   Hemoglobin A1C 8.5 (A) 4.0 - 5.6 %   HbA1c POC (<> result, manual entry)     HbA1c, POC (prediabetic range)     HbA1c, POC (controlled diabetic range)     Assessment & Plan:  This visit occurred during the SARS-CoV-2 public health emergency.  Safety protocols were in place, including screening questions prior to the visit, additional usage of staff PPE, and extensive cleaning of exam room while observing appropriate contact time as indicated for disinfecting solutions.   Problem List Items Addressed This Visit    Pain, dental - Primary    No dental abscess. Has established with oral surgeon and has planned extensive procedures in 2 weeks. Reviewed pain regimen to date - tylenol and tramadol ineffective, avoiding NSAIDs in cardiac history. Will continue tylenol 1000mg  but only twice daily, and add  hydrocodone 10/325mg  1/2 tab during the day and 1 tab at night time. Reviewed taking controlled substance only as prescribed and to keep in a safe place. Pt agrees with plan.       Cirrhosis of liver ([place])    H/o this, LFTs normal 10/2019. Will not exceed tylenol 3000mg  daily.       CAD S/P percutaneous coronary angioplasty   Relevant Medications   losartan (COZAAR) 25 MG tablet  rosuvastatin (CRESTOR) 5 MG tablet       Meds ordered this encounter  Medications  . losartan (COZAAR) 25 MG tablet    Sig: Take 1 tablet (25 mg total) by mouth daily.    Dispense:  90 tablet    Refill:  1  . rosuvastatin (CRESTOR) 5 MG tablet    Sig: Take 1 tablet (5 mg total) by mouth daily.    Dispense:  90 tablet    Refill:  1  . HYDROcodone-acetaminophen (NORCO) 10-325 MG tablet    Sig: Take 0.5-1 tablets by mouth 2 (two) times daily as needed for up to 5 days for moderate pain (sedation precautions).    Dispense:  15 tablet    Refill:  0   No orders of the defined types were placed in this encounter.   Patient Instructions  Call oral surgeon's office to let them know about your penicillin allergy (rash).  Continue tylenol 1000mg  during the day but only twice daily, may use hydrocodone 10/325mg  whole tablet at night time. Use hydrocodone during the day for breakthrough pain only, use 1/2 tablet.  Keep oral surgeon appointment.    Follow up plan: Return if symptoms worsen or fail to improve.  [name], MD

## 2020-05-29 NOTE — Telephone Encounter (Signed)
Did not see this until now. Will await OV today.

## 2020-05-29 NOTE — Assessment & Plan Note (Signed)
H/o this, LFTs normal 10/2019. Will not exceed tylenol 3000mg  daily.

## 2020-05-29 NOTE — Assessment & Plan Note (Addendum)
No dental abscess. Has established with oral surgeon and has planned extensive procedures in 2 weeks. Reviewed pain regimen to date - tylenol and tramadol ineffective, avoiding NSAIDs in cardiac history. Will continue tylenol 1000mg  but only twice daily, and add hydrocodone 10/325mg  1/2 tab during the day and 1 tab at night time. Reviewed taking controlled substance only as prescribed and to keep in a safe place. Pt agrees with plan.

## 2020-05-29 NOTE — Patient Instructions (Addendum)
Call oral surgeon's office to let them know about your penicillin allergy (rash).  Continue tylenol 1000mg  during the day but only twice daily, may use hydrocodone 10/325mg  whole tablet at night time. Use hydrocodone during the day for breakthrough pain only, use 1/2 tablet.  Keep oral surgeon appointment.

## 2020-06-03 ENCOUNTER — Encounter: Payer: Self-pay | Admitting: Family Medicine

## 2020-06-03 MED ORDER — METFORMIN HCL ER 750 MG PO TB24
1500.0000 mg | ORAL_TABLET | Freq: Every evening | ORAL | 0 refills | Status: DC
Start: 2020-06-03 — End: 2020-07-02

## 2020-06-03 NOTE — Telephone Encounter (Signed)
E-scribed refill 

## 2020-06-04 ENCOUNTER — Encounter: Payer: Self-pay | Admitting: Gastroenterology

## 2020-06-04 ENCOUNTER — Other Ambulatory Visit: Payer: Self-pay | Admitting: Family Medicine

## 2020-06-04 DIAGNOSIS — E785 Hyperlipidemia, unspecified: Secondary | ICD-10-CM

## 2020-06-04 DIAGNOSIS — E119 Type 2 diabetes mellitus without complications: Secondary | ICD-10-CM

## 2020-06-04 DIAGNOSIS — E1169 Type 2 diabetes mellitus with other specified complication: Secondary | ICD-10-CM

## 2020-06-04 DIAGNOSIS — K7469 Other cirrhosis of liver: Secondary | ICD-10-CM

## 2020-06-04 DIAGNOSIS — Z794 Long term (current) use of insulin: Secondary | ICD-10-CM

## 2020-06-05 ENCOUNTER — Other Ambulatory Visit: Payer: Self-pay | Admitting: Family Medicine

## 2020-06-06 ENCOUNTER — Other Ambulatory Visit: Payer: Self-pay | Admitting: Family Medicine

## 2020-06-08 ENCOUNTER — Other Ambulatory Visit (HOSPITAL_COMMUNITY): Payer: Self-pay

## 2020-06-08 ENCOUNTER — Other Ambulatory Visit: Payer: Self-pay

## 2020-06-09 ENCOUNTER — Other Ambulatory Visit: Payer: Self-pay

## 2020-06-09 ENCOUNTER — Encounter: Payer: Self-pay | Admitting: Family Medicine

## 2020-06-09 ENCOUNTER — Other Ambulatory Visit (INDEPENDENT_AMBULATORY_CARE_PROVIDER_SITE_OTHER): Payer: HMO

## 2020-06-09 DIAGNOSIS — E785 Hyperlipidemia, unspecified: Secondary | ICD-10-CM

## 2020-06-09 DIAGNOSIS — Z794 Long term (current) use of insulin: Secondary | ICD-10-CM

## 2020-06-09 DIAGNOSIS — E1169 Type 2 diabetes mellitus with other specified complication: Secondary | ICD-10-CM | POA: Diagnosis not present

## 2020-06-09 DIAGNOSIS — E119 Type 2 diabetes mellitus without complications: Secondary | ICD-10-CM

## 2020-06-09 DIAGNOSIS — K7469 Other cirrhosis of liver: Secondary | ICD-10-CM

## 2020-06-09 LAB — CBC WITH DIFFERENTIAL/PLATELET
Basophils Absolute: 0 10*3/uL (ref 0.0–0.1)
Basophils Relative: 0.7 % (ref 0.0–3.0)
Eosinophils Absolute: 0.1 10*3/uL (ref 0.0–0.7)
Eosinophils Relative: 1.3 % (ref 0.0–5.0)
HCT: 39 % (ref 36.0–46.0)
Hemoglobin: 13 g/dL (ref 12.0–15.0)
Lymphocytes Relative: 37.6 % (ref 12.0–46.0)
Lymphs Abs: 1.9 10*3/uL (ref 0.7–4.0)
MCHC: 33.3 g/dL (ref 30.0–36.0)
MCV: 86.7 fl (ref 78.0–100.0)
Monocytes Absolute: 0.4 10*3/uL (ref 0.1–1.0)
Monocytes Relative: 7.8 % (ref 3.0–12.0)
Neutro Abs: 2.6 10*3/uL (ref 1.4–7.7)
Neutrophils Relative %: 52.6 % (ref 43.0–77.0)
Platelets: 132 10*3/uL — ABNORMAL LOW (ref 150.0–400.0)
RBC: 4.5 Mil/uL (ref 3.87–5.11)
RDW: 14.7 % (ref 11.5–15.5)
WBC: 5 10*3/uL (ref 4.0–10.5)

## 2020-06-09 LAB — COMPREHENSIVE METABOLIC PANEL
ALT: 28 U/L (ref 0–35)
AST: 32 U/L (ref 0–37)
Albumin: 4.2 g/dL (ref 3.5–5.2)
Alkaline Phosphatase: 62 U/L (ref 39–117)
BUN: 16 mg/dL (ref 6–23)
CO2: 28 mEq/L (ref 19–32)
Calcium: 9.7 mg/dL (ref 8.4–10.5)
Chloride: 103 mEq/L (ref 96–112)
Creatinine, Ser: 0.66 mg/dL (ref 0.40–1.20)
GFR: 93.77 mL/min (ref >60.00)
Glucose, Bld: 127 mg/dL — ABNORMAL HIGH (ref 70–99)
Potassium: 4.7 mEq/L (ref 3.5–5.1)
Sodium: 138 mEq/L (ref 135–145)
Total Bilirubin: 0.6 mg/dL (ref 0.2–1.2)
Total Protein: 6.9 g/dL (ref 6.0–8.3)

## 2020-06-09 LAB — HEMOGLOBIN A1C: Hgb A1c MFr Bld: 9.1 % — ABNORMAL HIGH (ref 4.6–6.5)

## 2020-06-09 LAB — LIPID PANEL
Cholesterol: 151 mg/dL (ref 0–200)
HDL: 32.4 mg/dL — ABNORMAL LOW (ref >39.00)
NonHDL: 118.72
Total CHOL/HDL Ratio: 5
Triglycerides: 353 mg/dL — ABNORMAL HIGH (ref 0.0–149.0)
VLDL: 70.6 mg/dL — ABNORMAL HIGH (ref 0.0–40.0)

## 2020-06-09 LAB — PROTIME-INR
INR: 1.1 ratio — ABNORMAL HIGH (ref 0.8–1.0)
Prothrombin Time: 11.9 s (ref 9.6–13.1)

## 2020-06-09 LAB — LDL CHOLESTEROL, DIRECT: Direct LDL: 70 mg/dL

## 2020-06-09 NOTE — Telephone Encounter (Signed)
Pharmacy requests refill on: Rosuvastatin 5 mg   LAST REFILL: 05/29/2020 (Q-90, R-1) LAST OV: 05/29/2020 NEXT OV: 06/15/2020 PHARMACY: [facility] [address] [place]

## 2020-06-10 NOTE — Telephone Encounter (Signed)
Pt states that given her previous labs and abn A1C and INR, is it safe to have Oral Surgery tomorrow 06/11/20?  Their office needs to know ASAP - pt states that she can call them and give them an answer today. She has to give them a 24hr notice.   Please advise, thanks.

## 2020-06-13 NOTE — Telephone Encounter (Signed)
Late entry - provided verbal ok to proceed with surgery. INR 1.1 reasonable.  A1c 8% however in chronically uncontrolled this is likely reasonably safe to proceed with surgery.

## 2020-06-15 ENCOUNTER — Ambulatory Visit: Payer: Self-pay | Admitting: Physician Assistant

## 2020-06-15 ENCOUNTER — Encounter: Payer: Self-pay | Admitting: Family Medicine

## 2020-06-16 ENCOUNTER — Encounter: Payer: Self-pay | Admitting: Family Medicine

## 2020-06-16 NOTE — Telephone Encounter (Signed)
Not on current med list.

## 2020-06-18 MED ORDER — HYDROCODONE-ACETAMINOPHEN 5-325 MG PO TABS: 1.0000 | ORAL_TABLET | Freq: Three times a day (TID) | ORAL | 0 refills | Status: DC | PRN

## 2020-06-18 NOTE — Addendum Note (Signed)
Addended by: [name] on: 06/18/2020 01:10 PM   Modules accepted: Orders

## 2020-06-24 ENCOUNTER — Other Ambulatory Visit: Payer: Self-pay

## 2020-06-24 ENCOUNTER — Ambulatory Visit (HOSPITAL_COMMUNITY): Payer: HMO | Attending: Cardiovascular Disease

## 2020-06-24 DIAGNOSIS — R0602 Shortness of breath: Secondary | ICD-10-CM | POA: Diagnosis not present

## 2020-06-24 LAB — ECHOCARDIOGRAM COMPLETE
Area-P 1/2: 3.02 cm2
S' Lateral: 2.3 cm

## 2020-06-25 ENCOUNTER — Other Ambulatory Visit: Payer: Self-pay

## 2020-06-25 ENCOUNTER — Encounter: Payer: Self-pay | Admitting: Podiatry

## 2020-06-25 ENCOUNTER — Ambulatory Visit (INDEPENDENT_AMBULATORY_CARE_PROVIDER_SITE_OTHER): Payer: HMO | Admitting: Podiatry

## 2020-06-25 DIAGNOSIS — M79676 Pain in unspecified toe(s): Secondary | ICD-10-CM | POA: Diagnosis not present

## 2020-06-25 DIAGNOSIS — B351 Tinea unguium: Secondary | ICD-10-CM | POA: Diagnosis not present

## 2020-06-25 DIAGNOSIS — E1142 Type 2 diabetes mellitus with diabetic polyneuropathy: Secondary | ICD-10-CM

## 2020-06-26 ENCOUNTER — Encounter: Payer: Self-pay | Admitting: *Deleted

## 2020-06-26 ENCOUNTER — Telehealth: Payer: Self-pay | Admitting: Interventional Cardiology

## 2020-06-26 NOTE — Telephone Encounter (Signed)
Patient returning call.

## 2020-06-26 NOTE — Telephone Encounter (Signed)
° ° °  Pt is returning call for her echo result

## 2020-06-26 NOTE — Progress Notes (Signed)
This encounter was created in error - please disregard.

## 2020-06-26 NOTE — Telephone Encounter (Signed)
Spoke with pt and made her aware of results.  Pt verbalized understanding and was appreciative for call.

## 2020-06-26 NOTE — Telephone Encounter (Signed)
Called pt and left message letting her know I would release detailed results to MyChart and to call if any questions.

## 2020-06-27 NOTE — Progress Notes (Signed)
She presents today chief complaint of painfully elongated toenails and some numbness in her feet.  Objective: Pulses are palpable 1/4 DP PT capillary fill time is somewhat sluggish no open lesions or wounds.  Neurologic sensorium is diminished per Semmes Weinstein monofilament plantar aspect of the forefoot.  Deep tendon reflexes are intact muscle strength is normal and symmetrical bilateral.  Cutaneous evaluation demonstrates no open lesions or wounds over toenails are thick yellow dystrophic-like mycotic painful palpation as well as debridement.  Assessment: Diabetes with diabetic peripheral neuropathy some hammertoe development and painful onychomycosis.  Plan: At this point I went ahead and debrided all reactive hyperkeratotic tissue debrided toenails 1 through 5 bilateral.

## 2020-06-29 ENCOUNTER — Ambulatory Visit: Payer: HMO | Admitting: Family Medicine

## 2020-07-02 ENCOUNTER — Other Ambulatory Visit: Payer: Self-pay | Admitting: Family Medicine

## 2020-07-02 NOTE — Progress Notes (Addendum)
Virtual Visit via Video Note   This visit type was conducted due to national recommendations for restrictions regarding the COVID-19 Pandemic (e.g. social distancing) in an effort to limit this patient's exposure and mitigate transmission in our community.  Due to her co-morbid illnesses, this patient is at least at moderate risk for complications without adequate follow up.  This format is felt to be most appropriate for this patient at this time.  All issues noted in this document were discussed and addressed.  A limited physical exam was performed with this format.  Please refer to the patient's chart for her consent to telehealth for [facility].       Date:  07/03/2020   ID:  Karen [name], DOB 28-Apr-1956, MRN [id number] The patient was identified using 2 identifiers.  Patient Location: Home Provider Location: Home Office   PCP:  [name], [facility]  Cardiologist:  Sinclair Grooms, MD   Advanced Practice Provider:  No care team member to display Electrophysiologist:  None       Evaluation Performed:  Follow-Up Visit  Chief Complaint:  Follow-up (CAD)    Patient Profile: Karen [name] is a 65 y.o. female with:  Coronary artery disease   STEMI in 2015 >> s/p POBA to RCA and DES to RI  Myoview 5/17: low risk   Hypertension   Hyperlipidemia with elevated triglycerides  Diabetes mellitus   PSVT  S/p RFA 6/17  Seizure d/o  Cirrhosis  Prior CV Studies:   Echocardiogram 06/24/20 EF 70-75, no RWMA, Gr 1 DD, normal RVSF, mild LAE, mod MAC, mild AV sclerosis, no AS  Myoview 09/21/15 EF 71, normal perfusion, low risk   Echo 07/24/13 Mild LVH, EF 55-60%, normal wall motion, normal diastolic function  LHC 9/37 LM patent LAD patent RI ostial or proximal 95% LCx without significant obstruction, OM1 ostial 30-40% RCA mid occluded EF 55% PCI: POBA to mid RCA with residual stenosis 40%  Staged PCI 3/15 [facility]  Premier DES to the RI   History of Present Illness:   Ms. Karen [name] was last seen by Dr. Tamala Julian in 3/21.  She had symptoms of shortness of breath at that time.  NT-proBNP was normal.  An echocardiogram was ordered at that time.  However, it was not performed until 06/24/2020.  This demonstrated hyperdynamic LV function with mild diastolic dysfunction and no significant valve disease.  She is seen for follow-up.  Overall, she has been doing well without chest discomfort, significant shortness of breath, orthopnea, leg edema or syncope.   Past Medical History:  Diagnosis Date  . Body aches 03/24/2016   Resolved off simvastatin.   Karen [name] CAD (coronary artery disease)    STEMI 07/2013 - treated with PTCA of the RCA followed by DES to the intermediate ramus  //  Myoview 5/17 - Normal perfusion. LVEF 71% with normal wall motion. This is a low risk study.  . Chronic bronchitis   . Cirrhosis ([place])   . Depression   . Diabetes ([place])   . GERD (gastroesophageal reflux disease)   . High blood pressure   . History of chicken pox   . History of ovarian cyst   . HLD (hyperlipidemia)   . Seasonal allergies   . Seizure ([place]) 1977   off AEDs  . SVT (supraventricular tachycardia) ([place]) 10/2015   s/p catheter ablation  . Syncopal episodes   . Tachyarrhythmia    ER visits with adenosine push  to control  . Tubal pregnancy   . Type 2 diabetes, uncontrolled, with neuropathy ([place])    dx 1993   Past Surgical History:  Procedure Laterality Date  . ABLATION OF DYSRHYTHMIC FOCUS  11/06/2015   SVT  . CARDIAC CATHETERIZATION  07/2013   angioplasty to RCA and DES to ramus intermedius  . COLONOSCOPY WITH ESOPHAGOGASTRODUODENOSCOPY (EGD)  04/2019   esophageal varices II, portal hypertensive gastritis, gastric polyp, H pylori neg, 12 TAs, rpt colonoscopy in 1 yr ([facility])  . DILATION AND CURETTAGE OF UTERUS  1984  . ECTOPIC PREGNANCY SURGERY Right 1984   tube removal 2/2 hemorrhage  . ELECTROPHYSIOLOGIC STUDY N/A  11/06/2015   SVT Ablation;  Evans Lance, MD;  Location: [facility]  . LEFT HEART CATHETERIZATION WITH CORONARY ANGIOGRAM N/A 07/23/2013   Procedure: LEFT HEART CATHETERIZATION WITH CORONARY ANGIOGRAM;  Surgeon: Sinclair Grooms, MD;  Location: [facility] CATH LAB;  Service: Cardiovascular;  Laterality: N/A;  . OVARIAN CYST SURGERY  1998  . PERCUTANEOUS CORONARY STENT INTERVENTION (PCI-S) N/A 07/24/2013   Procedure: PERCUTANEOUS CORONARY STENT INTERVENTION (PCI-S);  Surgeon: Sinclair Grooms, MD;  Location[facility] C[facility] LAB;  Service: Cardiovascular;  Laterality: N/A;  . ROTATOR CUFF REPAIR  2006   Right  . SHOULDER ARTHROSCOPY Left 02/2017   LEFT shoulder arthroscopy with limited debridement, decompression/acromioplasty, open rotator cuff repair and biceps tenodesis ([facility])  . SUBMANDIBULAR DUCT LIGATION Right 10/2014   R submandibular gland stone removed [facility])  . TONSILLECTOMY AND ADENOIDECTOMY  1965     Current Meds  Medication Sig  . acetaminophen (TYLENOL) 500 MG tablet Take 1,000 mg by mouth 3 (three) times daily as needed for mild pain.  Karen [name] aspirin EC 81 MG EC tablet Take 1 tablet (81 mg total) by mouth daily.  Karen [name] dicyclomine (BENTYL) 20 MG tablet Take 1 tablet (20 mg total) by mouth 2 (two) times daily as needed for spasms.  . DULoxetine (CYMBALTA) 20 MG capsule TAKE ONE CAPSULE BY MOUTH EVERY DAY  . furosemide (LASIX) 20 MG tablet Take 1 tablet (20 mg total) by mouth daily as needed for edema.  . gabapentin (NEURONTIN) 300 MG capsule TAKE ONE CAPSULE BY MOUTH AT BEDTIME  . HYDROcodone-acetaminophen (NORCO/VICODIN) 5-325 MG tablet Take 1 tablet by mouth 3 (three) times daily as needed for moderate pain.  Karen [name] insulin detemir (LEVEMIR FLEXTOUCH) 100 UNIT/ML FlexPen Inject 60 Units into the skin in the morning AND 65 Units every evening.  . Insulin Pen Needle (PEN NEEDLES) 31G X 8 MM MISC Use to inject Lantus as directed twice daily. Dx: E11.40  . insulin regular (NOVOLIN R) 100 units/mL  injection Inject 0.25 mLs (25 Units total) into the skin 3 (three) times daily before meals. + sliding scale insulin 5u for every 50 over 200  . KLOR-CON SPRINKLE 10 MEQ CR capsule Take 1 capsule (10 mEq total) by mouth daily as needed. When taking Lasix  . losartan (COZAAR) 25 MG tablet Take 1 tablet (25 mg total) by mouth daily.  . metFORMIN (GLUCOPHAGE-XR) 750 MG 24 hr tablet Take 2 tablets (1,500 mg total) by mouth every evening.  . nitroGLYCERIN (NITROSTAT) 0.4 MG SL tablet Place 1 tablet (0.4 mg total) under the tongue every 5 (five) minutes as needed for chest pain.  Karen [name] omeprazole (PRILOSEC) 40 MG capsule TAKE ONE CAPSULE BY MOUTH TWICE A DAY  . polyethylene glycol (MIRALAX / GLYCOLAX) 17 g packet Take 17 g by mouth daily as needed.  Karen [name]  propranolol (INDERAL) 20 MG tablet Take 1 tablet (20 mg total) by mouth 2 (two) times daily.  . rosuvastatin (CRESTOR) 5 MG tablet Take 1 tablet (5 mg total) by mouth daily.  . Semaglutide,0.25 or 0.5MG/DOS, (OZEMPIC, 0.25 OR 0.5 MG/DOSE,) 2 MG/1.5ML SOPN Inject 0.5 mg into the skin once a week. Inject 0.15m weekly  . spironolactone (ALDACTONE) 25 MG tablet TAKE 1/2 TABLET BY MOUTH DAILY  . traMADol (ULTRAM) 50 MG tablet Take 1 tablet (50 mg total) by mouth daily as needed for moderate pain.   Current Facility-Administered Medications for the 07/03/20 encounter (Video Visit) with WRichardson DoppT, PA-C  Medication  . 0.9 %  sodium chloride infusion     Allergies:   Clindamycin/lincomycin, Erythromycin, Lipitor [atorvastatin], and Penicillins   Social History   Tobacco Use  . Smoking status: Never Smoker  . Smokeless tobacco: Never Used  Vaping Use  . Vaping Use: Never used  Substance Use Topics  . Alcohol use: No  . Drug use: No     Family Hx: The patient's family history includes Arrhythmia in her brother; Breast cancer in her paternal aunt; Cirrhosis in her maternal grandmother; Congestive Heart Failure in her father; Coronary artery disease in  her father and mother; Diabetes in her brother, brother, father, maternal grandfather, maternal grandmother, mother, paternal grandfather, paternal grandmother, and sister; Heart disease in her brother and mother; Hyperlipidemia in her father and mother; Hypertension in her father and mother; Pancreatic cancer in her father; Stroke in her mother. There is no history of Kidney disease.  ROS:   Please see the history of present illness.        Labs/Other Tests and Data Reviewed:    EKG:  No ECG reviewed.  Recent Labs: 07/22/2019: NT-Pro BNP 147; TSH 2.010 06/09/2020: ALT 28; BUN 16; Creatinine, Ser 0.66; Hemoglobin 13.0; Platelets 132.0; Potassium 4.7; Sodium 138   Recent Lipid Panel Lab Results  Component Value Date/Time   CHOL 151 06/09/2020 10:24 AM   TRIG 353.0 (H) 06/09/2020 10:24 AM   HDL 32.40 (L) 06/09/2020 10:24 AM   CHOLHDL 5 06/09/2020 10:24 AM   LDLCALC UNABLE TO CALCULATE IF TRIGLYCERIDE OVER 400 mg/dL 07/24/2013 12:20 AM   LDLDIRECT 70.0 06/09/2020 10:24 AM    Wt Readings from Last 3 Encounters:  07/03/20 175 lb (79.4 kg)  05/29/20 176 lb 9 oz (80.1 kg)  03/11/20 181 lb 9 oz (82.4 kg)     Risk Assessment/Calculations:      Objective:    Vital Signs:  Ht _0  (1.549 m)   Wt 175 lb (79.4 kg)   BMI 33.07 kg/m    VITAL SIGNS:  reviewed GEN:  no acute distress EYES:  sclerae anicteric, EOMI - Extraocular Movements Intact RESPIRATORY:  Normal respiratory effort PSYCH:  normal affect  ASSESSMENT & PLAN:    1. Coronary artery disease involving native coronary artery of native heart without angina pectoris History of inferior ST elevation myocardial infarction in 2015 treated with balloon angioplasty to the RCA and DES to the RI.  She really had no significant disease elsewhere.  Her EF has remained preserved.  She is doing well without anginal symptoms.  Continue aspirin, beta-blocker, statin therapy.  Follow-up with Dr. STamala Julianin 6 months.  2. SVT  (supraventricular tachycardia) (HCC) Status post radiofrequency catheter ablation.  She has had no apparent recurrence.  3. Cirrhosis of liver without ascites, unspecified hepatic cirrhosis type ([place] Continue follow-up with GI.  4. Essential hypertension The patient's  blood pressure is controlled on her current regimen.  Continue current therapy.   5. Dyslipidemia associated with type 2 diabetes mellitus ([place]) Her triglycerides remain elevated.  Most recent LDL was 70.  Goal for her LDL is less than 70.  It is worth increasing her Rosuvastatin to 10 mg once daily to see if we can get her Triglycerides a little lower and keep her LDL < 70.  We discussed the importance of limiting simple carbohydrates as well.  Increase rosuvastatin to 10 mg daily.  Obtain fasting lipids and LFTs in 3 months.   Time:   Today, I have spent 12 minutes with the patient with telehealth technology discussing the above problems.     Medication Adjustments/Labs and Tests Ordered: Current medicines are reviewed at length with the patient today.  Concerns regarding medicines are outlined above.   Tests Ordered: No orders of the defined types were placed in this encounter.   Medication Changes: No orders of the defined types were placed in this encounter.   Follow Up:  In Person in 6 month(s)  Signed, Richardson Dopp, PA-C  07/03/2020 1:24 PM    [facility]

## 2020-07-03 ENCOUNTER — Encounter: Payer: Self-pay | Admitting: Physician Assistant

## 2020-07-03 ENCOUNTER — Telehealth (INDEPENDENT_AMBULATORY_CARE_PROVIDER_SITE_OTHER): Payer: HMO | Admitting: Physician Assistant

## 2020-07-03 ENCOUNTER — Other Ambulatory Visit: Payer: Self-pay

## 2020-07-03 VITALS — Ht 61.0 in | Wt 175.0 lb

## 2020-07-03 DIAGNOSIS — I10 Essential (primary) hypertension: Secondary | ICD-10-CM

## 2020-07-03 DIAGNOSIS — K7460 Unspecified cirrhosis of liver: Secondary | ICD-10-CM | POA: Diagnosis not present

## 2020-07-03 DIAGNOSIS — E785 Hyperlipidemia, unspecified: Secondary | ICD-10-CM | POA: Diagnosis not present

## 2020-07-03 DIAGNOSIS — I471 Supraventricular tachycardia: Secondary | ICD-10-CM

## 2020-07-03 DIAGNOSIS — E1169 Type 2 diabetes mellitus with other specified complication: Secondary | ICD-10-CM

## 2020-07-03 DIAGNOSIS — I2510 Atherosclerotic heart disease of native coronary artery without angina pectoris: Secondary | ICD-10-CM

## 2020-07-03 MED ORDER — ROSUVASTATIN CALCIUM 10 MG PO TABS: 10.0000 mg | ORAL_TABLET | Freq: Every day | ORAL | 3 refills | Status: DC

## 2020-07-03 NOTE — Addendum Note (Signed)
Addended by: Carylon Perches on: 07/03/2020 03:19 PM   Modules accepted: Orders

## 2020-07-03 NOTE — Patient Instructions (Signed)
Medication Instructions:  Your physician has recommended you make the following change in your medication:   INCREASE: Rosuvastatin to 10mg  daily  *If you need a refill on your cardiac medications before your next appointment, please call your pharmacy*   Lab Work: Lipid, Lft's 09/30/2020  If you have labs (blood work) drawn today and your tests are completely normal, you will receive your results only by: Marland Kitchen MyChart Message (if you have MyChart) OR . A paper copy in the mail If you have any lab test that is abnormal or we need to change your treatment, we will call you to review the results.   Follow-Up: At [facility], you and your health needs are our priority.  As part of our continuing mission to provide you with exceptional heart care, we have created designated Provider Care Teams.  These Care Teams include your primary Cardiologist (physician) and Advanced Practice Providers (APPs -  Physician Assistants and Nurse Practitioners) who all work together to provide you with the care you need, when you need it.  We recommend signing up for the patient portal called "MyChart".  Sign up information is provided on this After Visit Summary.  MyChart is used to connect with patients for Virtual Visits (Telemedicine).  Patients are able to view lab/test results, encounter notes, upcoming appointments, etc.  Non-urgent messages can be sent to your provider as well.   To learn more about what you can do with MyChart, go to [email or web address].    Your next appointment:   6 month(s)  The format for your next appointment:   In Person  Provider:   You may see Sinclair Grooms, MD or one of the following Advanced Practice Providers on your designated Care Team:    Kathyrn Drown, NP

## 2020-07-04 ENCOUNTER — Encounter: Payer: Self-pay | Admitting: Family Medicine

## 2020-07-06 DIAGNOSIS — H211X3 Other vascular disorders of iris and ciliary body, bilateral: Secondary | ICD-10-CM | POA: Diagnosis not present

## 2020-07-06 DIAGNOSIS — H4311 Vitreous hemorrhage, right eye: Secondary | ICD-10-CM | POA: Diagnosis not present

## 2020-07-06 DIAGNOSIS — H43822 Vitreomacular adhesion, left eye: Secondary | ICD-10-CM | POA: Diagnosis not present

## 2020-07-06 DIAGNOSIS — E113513 Type 2 diabetes mellitus with proliferative diabetic retinopathy with macular edema, bilateral: Secondary | ICD-10-CM | POA: Diagnosis not present

## 2020-07-06 DIAGNOSIS — H35371 Puckering of macula, right eye: Secondary | ICD-10-CM | POA: Diagnosis not present

## 2020-07-06 LAB — HM DIABETES EYE EXAM

## 2020-07-10 ENCOUNTER — Ambulatory Visit (INDEPENDENT_AMBULATORY_CARE_PROVIDER_SITE_OTHER): Payer: HMO | Admitting: Family Medicine

## 2020-07-10 ENCOUNTER — Other Ambulatory Visit: Payer: Self-pay

## 2020-07-10 ENCOUNTER — Encounter: Payer: Self-pay | Admitting: Family Medicine

## 2020-07-10 VITALS — BP 118/62 | HR 73 | Temp 97.7°F | Ht 60.5 in | Wt 177.4 lb

## 2020-07-10 DIAGNOSIS — K219 Gastro-esophageal reflux disease without esophagitis: Secondary | ICD-10-CM | POA: Diagnosis not present

## 2020-07-10 DIAGNOSIS — E1169 Type 2 diabetes mellitus with other specified complication: Secondary | ICD-10-CM

## 2020-07-10 DIAGNOSIS — IMO0002 Reserved for concepts with insufficient information to code with codable children: Secondary | ICD-10-CM

## 2020-07-10 DIAGNOSIS — Z0000 Encounter for general adult medical examination without abnormal findings: Secondary | ICD-10-CM | POA: Insufficient documentation

## 2020-07-10 DIAGNOSIS — D696 Thrombocytopenia, unspecified: Secondary | ICD-10-CM | POA: Diagnosis not present

## 2020-07-10 DIAGNOSIS — K5909 Other constipation: Secondary | ICD-10-CM | POA: Diagnosis not present

## 2020-07-10 DIAGNOSIS — Z7189 Other specified counseling: Secondary | ICD-10-CM | POA: Diagnosis not present

## 2020-07-10 DIAGNOSIS — K7469 Other cirrhosis of liver: Secondary | ICD-10-CM

## 2020-07-10 DIAGNOSIS — Z794 Long term (current) use of insulin: Secondary | ICD-10-CM

## 2020-07-10 DIAGNOSIS — E113513 Type 2 diabetes mellitus with proliferative diabetic retinopathy with macular edema, bilateral: Secondary | ICD-10-CM | POA: Diagnosis not present

## 2020-07-10 DIAGNOSIS — E118 Type 2 diabetes mellitus with unspecified complications: Secondary | ICD-10-CM | POA: Diagnosis not present

## 2020-07-10 DIAGNOSIS — F331 Major depressive disorder, recurrent, moderate: Secondary | ICD-10-CM

## 2020-07-10 DIAGNOSIS — I2510 Atherosclerotic heart disease of native coronary artery without angina pectoris: Secondary | ICD-10-CM | POA: Diagnosis not present

## 2020-07-10 DIAGNOSIS — K0889 Other specified disorders of teeth and supporting structures: Secondary | ICD-10-CM | POA: Diagnosis not present

## 2020-07-10 DIAGNOSIS — Z9861 Coronary angioplasty status: Secondary | ICD-10-CM

## 2020-07-10 DIAGNOSIS — E785 Hyperlipidemia, unspecified: Secondary | ICD-10-CM

## 2020-07-10 DIAGNOSIS — I10 Essential (primary) hypertension: Secondary | ICD-10-CM | POA: Diagnosis not present

## 2020-07-10 DIAGNOSIS — E1165 Type 2 diabetes mellitus with hyperglycemia: Secondary | ICD-10-CM

## 2020-07-10 MED ORDER — FREESTYLE LIBRE 2 READER DEVI: 0 refills | Status: DC

## 2020-07-10 MED ORDER — FREESTYLE LIBRE 2 SENSOR MISC: 3 refills | Status: DC

## 2020-07-10 NOTE — Assessment & Plan Note (Signed)

## 2020-07-10 NOTE — Assessment & Plan Note (Signed)
Appreciate GI care.  

## 2020-07-10 NOTE — Assessment & Plan Note (Signed)
Stable period managed with PRN miralax.

## 2020-07-10 NOTE — Assessment & Plan Note (Addendum)
Has established with retinologist Dr Princess Bruins - receiving avastin eye injections, planning laser surgery - appreciate his care

## 2020-07-10 NOTE — Progress Notes (Signed)
Patient ID: Karen [name], female    DOB: August 30, 1955, 65 y.o.   MRN: [id number]  This visit was conducted in person.  BP 118/62   Pulse 73   Temp 97.7 F (36.5 C) (Temporal)   Ht 5' 0.5" (1.537 m)   Wt 177 lb 6 oz (80.5 kg)   SpO2 95%   BMI 34.07 kg/m    CC: welcome to medicare  Subjective:   HPI: Karen [name] is a 65 y.o. female presenting on 07/10/2020 for Welcome To Medicare Exam   Just received medicare 04/2020 (HTA).    Hearing Screening   125Hz  250Hz  500Hz  1000Hz  2000Hz  3000Hz  4000Hz  6000Hz  8000Hz   Right ear:   0 0 20  0    Left ear:   25 20 20   40    Comments: Pt states she is almost completely deaf in right ear.    Visual Acuity Screening   Right eye Left eye Both eyes  Without correction: 0 20/50 20/50  With correction:     Comments: Scheduled for laser surgery in left eye on 07/13/2020.   [place] Visit from 07/10/2020 in [facility]  PHQ-2 Total Score 0      Fall Risk  07/10/2020 10/28/2019  Falls in the past year? 0 0  Number falls in past yr: - 0  Injury with Fall? - 0  Follow up - Falls evaluation completed    Recent retina eval 06/2020 - proliferative diabetic retinopathy with macular edema bilateral, puckering of R macula, L vitreomacular adhesion, R vitreous hemorrhage and bilat hypertensive retinopathy.   Requests Rx for freestyle CGM - checks sugars 3-4 times daily, injects insulin levemir twice daily as well as novolin R with meals, as well as weekly ozempic.   Preventative: COLONOSCOPY WITH ESOPHAGOGASTRODUODENOSCOPY 04/2019 - esophageal varices II, portal hypertensive gastritis, gastric polyp, H pylori neg, 12 TAs, rpt colonoscopy in 1 yr (Armbruster) - planning to reschedule soon.  Well woman exam 01/2019 - all normal, neg HPV, told rpt 3 yrs   Mammogram 01/2019 - Birads1 @ [facility] to reschedule soon Lung cancer screening - not eligible Flu shot - yearly [place] 07/2019, 08/2019,  04/2020 Pneumovax - 2015 Tdap - 2017 Shingrix - discussed  Advanced directive discussed: Discussed with patient - she does not have this at home. She's unsure who she would designate as HCPOA - will consider this. Packet provided today.  Seat belt use discussed Sunscreen use discussed. No changing moles on skin.  Smoking - never Alcohol - none Dentist - seeing regularly undergoing dental work  Eye exam - recently established - seeing retina specialist Bowel - constipation - managed with miralax Bladder - mild stress incontinence   Caffeine: 6 diet sodas/day Lives with 2 dogs. Occupation: was CNA - now on disability Activity: walking some Diet: some water, fruits/vegetables daily      Relevant past medical, surgical, family and social history reviewed and updated as indicated. Interim medical history since our last visit reviewed. Allergies and medications reviewed and updated. Outpatient Medications Prior to Visit  Medication Sig Dispense Refill  . acetaminophen (TYLENOL) 500 MG tablet Take 1,000 mg by mouth 3 (three) times daily as needed for mild pain.    Marland Kitchen aspirin EC 81 MG EC tablet Take 1 tablet (81 mg total) by mouth daily.    Marland Kitchen dicyclomine (BENTYL) 20 MG tablet Take 1 tablet (20 mg total) by mouth 2 (two) times daily  as needed for spasms. 45 tablet 3  . DULoxetine (CYMBALTA) 20 MG capsule TAKE ONE CAPSULE BY MOUTH EVERY DAY 30 capsule 0  . furosemide (LASIX) 20 MG tablet Take 1 tablet (20 mg total) by mouth daily as needed for edema. 30 tablet 3  . gabapentin (NEURONTIN) 300 MG capsule TAKE ONE CAPSULE BY MOUTH AT BEDTIME 90 capsule 3  . insulin detemir (LEVEMIR FLEXTOUCH) 100 UNIT/ML FlexPen Inject 60 Units into the skin in the morning AND 65 Units every evening. 35 mL 0  . Insulin Pen Needle (PEN NEEDLES) 31G X 8 MM MISC Use to inject Lantus as directed twice daily. Dx: E11.40 60 each 3  . insulin regular (NOVOLIN R) 100 units/mL injection Inject 0.25 mLs (25 Units total)  into the skin 3 (three) times daily before meals. + sliding scale insulin 5u for every 50 over 200 10 mL   . KLOR-CON SPRINKLE 10 MEQ CR capsule Take 1 capsule (10 mEq total) by mouth daily as needed. When taking Lasix 30 capsule 3  . losartan (COZAAR) 25 MG tablet Take 1 tablet (25 mg total) by mouth daily. 90 tablet 1  . metFORMIN (GLUCOPHAGE-XR) 750 MG 24 hr tablet Take 2 tablets (1,500 mg total) by mouth every evening. 60 tablet 0  . nitroGLYCERIN (NITROSTAT) 0.4 MG SL tablet Place 1 tablet (0.4 mg total) under the tongue every 5 (five) minutes as needed for chest pain. 25 tablet 0  . omeprazole (PRILOSEC) 40 MG capsule TAKE ONE CAPSULE BY MOUTH TWICE A DAY 60 capsule 5  . polyethylene glycol (MIRALAX / GLYCOLAX) 17 g packet Take 17 g by mouth daily as needed.    . propranolol (INDERAL) 20 MG tablet Take 1 tablet (20 mg total) by mouth 2 (two) times daily. 180 tablet 3  . rosuvastatin (CRESTOR) 10 MG tablet Take 1 tablet (10 mg total) by mouth daily. 90 tablet 3  . Semaglutide,0.25 or 0.5MG /DOS, (OZEMPIC, 0.25 OR 0.5 MG/DOSE,) 2 MG/1.5ML SOPN Inject 0.5 mg into the skin once a week. Inject 0.5mg  weekly    . spironolactone (ALDACTONE) 25 MG tablet TAKE 1/2 TABLET BY MOUTH DAILY 45 tablet 3  . traMADol (ULTRAM) 50 MG tablet Take 1 tablet (50 mg total) by mouth daily as needed for moderate pain. 30 tablet 0  . HYDROcodone-acetaminophen (NORCO/VICODIN) 5-325 MG tablet Take 1 tablet by mouth 3 (three) times daily as needed for moderate pain. 15 tablet 0   Facility-Administered Medications Prior to Visit  Medication Dose Route Frequency Provider Last Rate Last Admin  . 0.9 %  sodium chloride infusion  500 mL Intravenous Once Armbruster, Carlota Raspberry, MD         Per HPI unless specifically indicated in ROS section below Review of Systems Objective:  BP 118/62   Pulse 73   Temp 97.7 F (36.5 C) (Temporal)   Ht 5' 0.5" (1.537 m)   Wt 177 lb 6 oz (80.5 kg)   SpO2 95%   BMI 34.07 kg/m   Wt  Readings from Last 3 Encounters:  07/10/20 177 lb 6 oz (80.5 kg)  07/03/20 175 lb (79.4 kg)  05/29/20 176 lb 9 oz (80.1 kg)      Physical Exam Vitals and nursing note reviewed.  Constitutional:      General: She is not in acute distress.    Appearance: Normal appearance. She is well-developed and well-nourished. She is not ill-appearing.  HENT:     Head: Normocephalic and atraumatic.     Right  Ear: Hearing, tympanic membrane, ear canal and external ear normal.     Left Ear: Hearing, tympanic membrane, ear canal and external ear normal.     Mouth/Throat:     Mouth: Oropharynx is clear and moist and mucous membranes are normal.     Pharynx: No posterior oropharyngeal edema.  Eyes:     General: No scleral icterus.    Extraocular Movements: Extraocular movements intact and EOM normal.     Conjunctiva/sclera: Conjunctivae normal.     Pupils: Pupils are equal, round, and reactive to light.  Neck:     Thyroid: No thyroid mass or thyromegaly.     Vascular: No carotid bruit.  Cardiovascular:     Rate and Rhythm: Normal rate and regular rhythm.     Pulses: Normal pulses and intact distal pulses.          Radial pulses are 2+ on the right side and 2+ on the left side.     Heart sounds: Normal heart sounds. No murmur heard.   Pulmonary:     Effort: Pulmonary effort is normal. No respiratory distress.     Breath sounds: Normal breath sounds. No wheezing, rhonchi or rales.  Abdominal:     General: Abdomen is flat. Bowel sounds are normal. There is no distension.     Palpations: Abdomen is soft. There is no mass.     Tenderness: There is no abdominal tenderness. There is no guarding or rebound.     Hernia: No hernia is present.  Musculoskeletal:        General: No edema. Normal range of motion.     Cervical back: Normal range of motion and neck supple.     Right lower leg: No edema.     Left lower leg: No edema.  Lymphadenopathy:     Cervical: No cervical adenopathy.  Skin:     General: Skin is warm and dry.     Findings: No rash.  Neurological:     General: No focal deficit present.     Mental Status: She is alert and oriented to person, place, and time.     Comments:  CN grossly intact, station and gait intact Recall 3/3  Calculation 5/5 DLROW  Psychiatric:        Mood and Affect: Mood and affect and mood normal.        Behavior: Behavior normal.        Thought Content: Thought content normal.        Judgment: Judgment normal.       Lab Results  Component Value Date   CREATININE 0.66 06/09/2020   BUN 16 06/09/2020   NA 138 06/09/2020   K 4.7 06/09/2020   CL 103 06/09/2020   CO2 28 06/09/2020    Lab Results  Component Value Date   CHOL 151 06/09/2020   HDL 32.40 (L) 06/09/2020   LDLCALC UNABLE TO CALCULATE IF TRIGLYCERIDE OVER 400 mg/dL 07/24/2013   LDLDIRECT 70.0 06/09/2020   TRIG 353.0 (H) 06/09/2020   CHOLHDL 5 06/09/2020    Lab Results  Component Value Date   HGBA1C 9.1 (H) 06/09/2020    Depression screen PHQ 2/9 07/10/2020  Decreased Interest 0  Down, Depressed, Hopeless 0  PHQ - 2 Score 0  Altered sleeping 1  Tired, decreased energy 0  Change in appetite 1  Feeling bad or failure about yourself  0  Trouble concentrating 0  Moving slowly or fidgety/restless 0  Suicidal thoughts 0  PHQ-9 Score 2  Some recent data might be hidden    GAD 7 : Generalized Anxiety Score 07/10/2020  Nervous, Anxious, on Edge 0  Control/stop worrying 0  Worry too much - different things 0  Trouble relaxing 0  Restless 0  Easily annoyed or irritable 0  Afraid - awful might happen 0  Total GAD 7 Score 0   EKG - NSR rate 70, normal axis, intervals, no hypertrophy or acute ST/T changes Assessment & Plan:  This visit occurred during the SARS-CoV-2 public health emergency.  Safety protocols were in place, including screening questions prior to the visit, additional usage of staff PPE, and extensive cleaning of exam room while observing appropriate  contact time as indicated for disinfecting solutions.   Problem List Items Addressed This Visit    Welcome to Medicare preventive visit - Primary    I have personally reviewed the Medicare Annual Wellness questionnaire and have noted 1. The patient's medical and social history 2. Their use of alcohol, tobacco or illicit drugs 3. Their current medications and supplements 4. The patient's functional ability including ADL's, fall risks, home safety risks and hearing or visual impairment. Cognitive function has been assessed and addressed as indicated.  5. Diet and physical activity 6. Evidence for depression or mood disorders The patients weight, height, BMI have been recorded in the chart. I have made referrals, counseling and provided education to the patient based on review of the above and I have provided the pt with a written personalized care plan for preventive services. Provider list updated.. See scanned questionairre as needed for further documentation. Reviewed preventative protocols and updated unless pt declined.       Relevant Orders   EKG 12-Lead (Completed)   Type 2 diabetes mellitus with proliferative diabetic retinopathy with macular edema, bilateral ([place])    Has established with retinologist Dr Princess Bruins - receiving avastin eye injections, planning laser surgery - appreciate his care      Thrombocytopenia ([place])    Related to liver disease.       Pain, dental    In process of dental extractions.  Continue f/u with oral surgeon.      MDD (major depressive disorder), recurrent episode, moderate ([facility])    Chronic, stable period on cymbalta 20mg  daily - continue.       HTN (hypertension)    Chronic, stable. Continue current regimen.       GERD (gastroesophageal reflux disease)    Stable period on omeprazole bid.       Dyslipidemia associated with type 2 diabetes mellitus ([place])    Chronic, on crestor, recently dose increased to 10mg  daily by cardiology in  hopes of improving triglyceride control. The 10-year ASCVD risk score Mikey Bussing DC Brooke Bonito., et al., 2013) is: 9.8%   Values used to calculate the score:     Age: 38 years     Sex: Female     Is Non-Hispanic African American: No     Diabetic: Yes     Tobacco smoker: No     Systolic Blood Pressure: 191 mmHg     Is BP treated: Yes     HDL Cholesterol: 32.4 mg/dL     Total Cholesterol: 151 mg/dL       DM (diabetes mellitus), type 2, uncontrolled with complications ([facility])    Chronic. Insulin dependent. Continue ozempic and basal insulin levemir and mealtime insulin novolin R and metformin XR. Consider increased ozempic dose.  Should qualify for CGM - freestyle libre sent to pharmacy per  pt request.       Cirrhosis of liver ([place])    Appreciate GI care.       Chronic constipation    Stable period managed with PRN miralax.       CAD S/P percutaneous coronary angioplasty    Continue aspirin, statin. Previously unable to afford DAPT - this may have changed now with insurance. Has cards f/u planned 6 mo.      Advanced directives, counseling/discussion    Discussed with patient - she does not have this at home. She's unsure who she would designate as HCPOA - will consider this. Packet provided today.           Meds ordered this encounter  Medications  . Continuous Blood Gluc Sensor (FREESTYLE LIBRE 2 SENSOR) MISC    Sig: Use as directed to check sugars 3-4 times daily    Dispense:  6 each    Refill:  3  . Continuous Blood Gluc Receiver (FREESTYLE LIBRE 2 READER) DEVI    Sig: Use as directed to check sugars 3-4 times daily    Dispense:  1 each    Refill:  0   Orders Placed This Encounter  Procedures  . EKG 12-Lead    Patient instructions: EKG today  If interested, check with pharmacy about new 2 shot shingles series (shingrix).  Good to see you today. I'm glad you're doing well today.  Return as needed or in 3-4 months for follow up visit.   Follow up plan: Return in about 4  months (around 11/07/2020) for follow up visit.  [name], MD

## 2020-07-10 NOTE — Assessment & Plan Note (Signed)
Continue aspirin, statin. Previously unable to afford DAPT - this may have changed now with insurance. Has cards f/u planned 6 mo.

## 2020-07-10 NOTE — Assessment & Plan Note (Signed)
Chronic, on crestor, recently dose increased to 10mg  daily by cardiology in hopes of improving triglyceride control. The 10-year ASCVD risk score Mikey Bussing DC Brooke Bonito., et al., 2013) is: 9.8%   Values used to calculate the score:     Age: 65 years     Sex: Female     Is Non-Hispanic African American: No     Diabetic: Yes     Tobacco smoker: No     Systolic Blood Pressure: 183 mmHg     Is BP treated: Yes     HDL Cholesterol: 32.4 mg/dL     Total Cholesterol: 151 mg/dL

## 2020-07-10 NOTE — Assessment & Plan Note (Signed)
Stable period on omeprazole bid.

## 2020-07-10 NOTE — Patient Instructions (Addendum)
EKG today  If interested, check with pharmacy about new 2 shot shingles series (shingrix).  Good to see you today. I'm glad you're doing well today.  Return as needed or in 3-4 months for follow up visit.   Health Maintenance for Postmenopausal Women Menopause is a normal process in which your ability to get pregnant comes to an end. This process happens slowly over many months or years, usually between the ages of 33 and 76. Menopause is complete when you have missed your menstrual periods for 12 months. It is important to talk with your health care provider about some of the most common conditions that affect women after menopause (postmenopausal women). These include heart disease, cancer, and bone loss (osteoporosis). Adopting a healthy lifestyle and getting preventive care can help to promote your health and wellness. The actions you take can also lower your chances of developing some of these common conditions. What should I know about menopause? During menopause, you may get a number of symptoms, such as:  Hot flashes. These can be moderate or severe.  Night sweats.  Decrease in sex drive.  Mood swings.  Headaches.  Tiredness.  Irritability.  Memory problems.  Insomnia. Choosing to treat or not to treat these symptoms is a decision that you make with your health care provider. Do I need hormone replacement therapy?  Hormone replacement therapy is effective in treating symptoms that are caused by menopause, such as hot flashes and night sweats.  Hormone replacement carries certain risks, especially as you become older. If you are thinking about using estrogen or estrogen with progestin, discuss the benefits and risks with your health care provider. What is my risk for heart disease and stroke? The risk of heart disease, heart attack, and stroke increases as you age. One of the causes may be a change in the body's hormones during menopause. This can affect how your body uses  dietary fats, triglycerides, and cholesterol. Heart attack and stroke are medical emergencies. There are many things that you can do to help prevent heart disease and stroke. Watch your blood pressure  High blood pressure causes heart disease and increases the risk of stroke. This is more likely to develop in people who have high blood pressure readings, are of African descent, or are overweight.  Have your blood pressure checked: ? Every 3-5 years if you are 84-76 years of age. ? Every year if you are 47 years old or older. Eat a healthy diet  Eat a diet that includes plenty of vegetables, fruits, low-fat dairy products, and lean protein.  Do not eat a lot of foods that are high in solid fats, added sugars, or sodium.   Get regular exercise Get regular exercise. This is one of the most important things you can do for your health. Most adults should:  Try to exercise for at least 150 minutes each week. The exercise should increase your heart rate and make you sweat (moderate-intensity exercise).  Try to do strengthening exercises at least twice each week. Do these in addition to the moderate-intensity exercise.  Spend less time sitting. Even light physical activity can be beneficial. Other tips  Work with your health care provider to achieve or maintain a healthy weight.  Do not use any products that contain nicotine or tobacco, such as cigarettes, e-cigarettes, and chewing tobacco. If you need help quitting, ask your health care provider.  Know your numbers. Ask your health care provider to check your cholesterol and your blood sugar (  glucose). Continue to have your blood tested as directed by your health care provider. Do I need screening for cancer? Depending on your health history and family history, you may need to have cancer screening at different stages of your life. This may include screening for:  Breast cancer.  Cervical cancer.  Lung cancer.  Colorectal cancer. What  is my risk for osteoporosis? After menopause, you may be at increased risk for osteoporosis. Osteoporosis is a condition in which bone destruction happens more quickly than new bone creation. To help prevent osteoporosis or the bone fractures that can happen because of osteoporosis, you may take the following actions:  If you are 36-69 years old, get at least 1,000 mg of calcium and at least 600 mg of vitamin D per day.  If you are older than age 3 but younger than age 43, get at least 1,200 mg of calcium and at least 600 mg of vitamin D per day.  If you are older than age 62, get at least 1,200 mg of calcium and at least 800 mg of vitamin D per day. Smoking and drinking excessive alcohol increase the risk of osteoporosis. Eat foods that are rich in calcium and vitamin D, and do weight-bearing exercises several times each week as directed by your health care provider. How does menopause affect my mental health? Depression may occur at any age, but it is more common as you become older. Common symptoms of depression include:  Low or sad mood.  Changes in sleep patterns.  Changes in appetite or eating patterns.  Feeling an overall lack of motivation or enjoyment of activities that you previously enjoyed.  Frequent crying spells. Talk with your health care provider if you think that you are experiencing depression. General instructions See your health care provider for regular wellness exams and vaccines. This may include:  Scheduling regular health, dental, and eye exams.  Getting and maintaining your vaccines. These include: ? Influenza vaccine. Get this vaccine each year before the flu season begins. ? Pneumonia vaccine. ? Shingles vaccine. ? Tetanus, diphtheria, and pertussis (Tdap) booster vaccine. Your health care provider may also recommend other immunizations. Tell your health care provider if you have ever been abused or do not feel safe at home. Summary  Menopause is a  normal process in which your ability to get pregnant comes to an end.  This condition causes hot flashes, night sweats, decreased interest in sex, mood swings, headaches, or lack of sleep.  Treatment for this condition may include hormone replacement therapy.  Take actions to keep yourself healthy, including exercising regularly, eating a healthy diet, watching your weight, and checking your blood pressure and blood sugar levels.  Get screened for cancer and depression. Make sure that you are up to date with all your vaccines. This information is not intended to replace advice given to you by your health care provider. Make sure you discuss any questions you have with your health care provider. Document Revised: 04/25/2018 Document Reviewed: 04/25/2018 Elsevier Patient Education  2021 [employer].

## 2020-07-10 NOTE — Assessment & Plan Note (Addendum)
Chronic. Insulin dependent. Continue ozempic and basal insulin levemir and mealtime insulin novolin R and metformin XR. Consider increased ozempic dose.  Should qualify for CGM - freestyle libre sent to pharmacy per pt request.

## 2020-07-10 NOTE — Assessment & Plan Note (Signed)
Chronic, stable period on cymbalta 20mg  daily - continue.

## 2020-07-10 NOTE — Assessment & Plan Note (Signed)
Chronic, stable. Continue current regimen. 

## 2020-07-10 NOTE — Assessment & Plan Note (Signed)
Related to liver disease.

## 2020-07-10 NOTE — Assessment & Plan Note (Signed)
In process of dental extractions.  Continue f/u with oral surgeon.

## 2020-07-10 NOTE — Assessment & Plan Note (Addendum)
Discussed with patient - she does not have this at home. She's unsure who she would designate as HCPOA - will consider this. Packet provided today.

## 2020-07-13 ENCOUNTER — Encounter: Payer: Self-pay | Admitting: Family Medicine

## 2020-07-13 DIAGNOSIS — E113512 Type 2 diabetes mellitus with proliferative diabetic retinopathy with macular edema, left eye: Secondary | ICD-10-CM | POA: Diagnosis not present

## 2020-07-24 ENCOUNTER — Other Ambulatory Visit: Payer: Self-pay | Admitting: Family Medicine

## 2020-07-24 ENCOUNTER — Encounter: Payer: Self-pay | Admitting: Gastroenterology

## 2020-07-24 NOTE — Telephone Encounter (Signed)
Inderal Last filled:  04/23/21, #180 Last OV:  07/10/20, W 2 MCR Next OV:  10/09/20, 3-4 mo f/u

## 2020-07-30 ENCOUNTER — Other Ambulatory Visit: Payer: Self-pay | Admitting: Family Medicine

## 2020-07-30 ENCOUNTER — Encounter: Payer: Self-pay | Admitting: Family Medicine

## 2020-07-30 DIAGNOSIS — I10 Essential (primary) hypertension: Secondary | ICD-10-CM

## 2020-07-30 DIAGNOSIS — E785 Hyperlipidemia, unspecified: Secondary | ICD-10-CM

## 2020-07-31 ENCOUNTER — Other Ambulatory Visit: Payer: Self-pay | Admitting: Family Medicine

## 2020-07-31 ENCOUNTER — Encounter: Payer: Self-pay | Admitting: Family Medicine

## 2020-07-31 MED ORDER — METFORMIN HCL ER 750 MG PO TB24: 1500.0000 mg | ORAL_TABLET | Freq: Every evening | ORAL | 1 refills | Status: DC

## 2020-07-31 MED ORDER — PROPRANOLOL HCL 20 MG PO TABS: 20.0000 mg | ORAL_TABLET | Freq: Two times a day (BID) | ORAL | 3 refills | Status: DC

## 2020-07-31 MED ORDER — FUROSEMIDE 20 MG PO TABS: 20.0000 mg | ORAL_TABLET | Freq: Every day | ORAL | 11 refills | Status: DC | PRN

## 2020-07-31 MED ORDER — KLOR-CON SPRINKLE 10 MEQ PO CPCR: 10.0000 meq | ORAL_CAPSULE | Freq: Every day | ORAL | 11 refills | Status: DC | PRN

## 2020-07-31 NOTE — Telephone Encounter (Signed)
Pharmacy requests refill on: Metformin 750 mg 24 hr   LAST REFILL: 07/02/2020 (Q-60, R-0) LAST OV: 07/10/2020 NEXT OV: 10/09/2020 PHARMACY: [facility], [place]  Hgb A1C (06/09/2020): 9.1

## 2020-07-31 NOTE — Telephone Encounter (Signed)
Pharmacy requests refill on: Duloxetine 20 mg   LAST REFILL: 07/02/2020 (Q-30, R-0) LAST OV: 07/10/2020 NEXT OV: 10/09/2020 PHARMACY: [place], [place]

## 2020-07-31 NOTE — Telephone Encounter (Signed)
E-scribed refill 

## 2020-07-31 NOTE — Telephone Encounter (Addendum)
Updated pt's pharmacy list.   Nitrostat Last rx:  12/22/17, #25 Last OV:  W 2 MCR, 07/10/20 Next OV:  10/09/20, 4 mo f/u

## 2020-08-01 ENCOUNTER — Other Ambulatory Visit: Payer: Self-pay | Admitting: Family Medicine

## 2020-08-01 DIAGNOSIS — Z794 Long term (current) use of insulin: Secondary | ICD-10-CM

## 2020-08-01 DIAGNOSIS — E119 Type 2 diabetes mellitus without complications: Secondary | ICD-10-CM

## 2020-08-03 ENCOUNTER — Other Ambulatory Visit: Payer: Self-pay | Admitting: Family Medicine

## 2020-08-03 MED ORDER — NITROGLYCERIN 0.4 MG SL SUBL
0.4000 mg | SUBLINGUAL_TABLET | SUBLINGUAL | 0 refills | Status: AC | PRN
Start: 2020-08-03 — End: ?

## 2020-08-03 MED ORDER — DULOXETINE HCL 20 MG PO CPEP: 20.0000 mg | ORAL_CAPSULE | Freq: Every day | ORAL | 3 refills | Status: DC

## 2020-08-18 ENCOUNTER — Encounter: Payer: Self-pay | Admitting: Family Medicine

## 2020-08-18 DIAGNOSIS — H35371 Puckering of macula, right eye: Secondary | ICD-10-CM | POA: Diagnosis not present

## 2020-08-18 DIAGNOSIS — H4311 Vitreous hemorrhage, right eye: Secondary | ICD-10-CM | POA: Diagnosis not present

## 2020-08-18 DIAGNOSIS — E113513 Type 2 diabetes mellitus with proliferative diabetic retinopathy with macular edema, bilateral: Secondary | ICD-10-CM | POA: Diagnosis not present

## 2020-08-18 DIAGNOSIS — E113511 Type 2 diabetes mellitus with proliferative diabetic retinopathy with macular edema, right eye: Secondary | ICD-10-CM | POA: Diagnosis not present

## 2020-08-18 DIAGNOSIS — H43822 Vitreomacular adhesion, left eye: Secondary | ICD-10-CM | POA: Diagnosis not present

## 2020-08-18 LAB — HM DIABETES EYE EXAM

## 2020-08-24 ENCOUNTER — Other Ambulatory Visit: Payer: Self-pay | Admitting: Family Medicine

## 2020-08-24 NOTE — Telephone Encounter (Signed)
E-scribed losartan.  Too soon for gabapentin.

## 2020-08-27 ENCOUNTER — Encounter: Payer: Self-pay | Admitting: Family Medicine

## 2020-08-31 ENCOUNTER — Encounter: Payer: Self-pay | Admitting: Family Medicine

## 2020-09-01 ENCOUNTER — Encounter: Payer: Self-pay | Admitting: Family Medicine

## 2020-09-03 NOTE — Telephone Encounter (Signed)
Noted  

## 2020-09-03 NOTE — Telephone Encounter (Signed)
Called and left vm for the patient to call and schedule DM followup. I also sent a Patient message. EM

## 2020-09-03 NOTE — Telephone Encounter (Signed)
Please schedule OV for patient. plz have her bring in sugar readings to appt

## 2020-09-05 ENCOUNTER — Encounter (HOSPITAL_COMMUNITY): Payer: Self-pay

## 2020-09-05 ENCOUNTER — Ambulatory Visit (HOSPITAL_COMMUNITY)
Admission: EM | Admit: 2020-09-05 | Discharge: 2020-09-05 | Disposition: A | Discharge status: Home / Self Care | Payer: HMO | Attending: Family Medicine | Admitting: Family Medicine

## 2020-09-05 ENCOUNTER — Other Ambulatory Visit: Payer: Self-pay

## 2020-09-05 DIAGNOSIS — Z7982 Long term (current) use of aspirin: Secondary | ICD-10-CM | POA: Insufficient documentation

## 2020-09-05 DIAGNOSIS — R5383 Other fatigue: Secondary | ICD-10-CM | POA: Insufficient documentation

## 2020-09-05 DIAGNOSIS — Z880 Allergy status to penicillin: Secondary | ICD-10-CM | POA: Insufficient documentation

## 2020-09-05 DIAGNOSIS — Z794 Long term (current) use of insulin: Secondary | ICD-10-CM | POA: Insufficient documentation

## 2020-09-05 DIAGNOSIS — R059 Cough, unspecified: Secondary | ICD-10-CM | POA: Diagnosis present

## 2020-09-05 DIAGNOSIS — E119 Type 2 diabetes mellitus without complications: Secondary | ICD-10-CM | POA: Insufficient documentation

## 2020-09-05 DIAGNOSIS — Z833 Family history of diabetes mellitus: Secondary | ICD-10-CM | POA: Insufficient documentation

## 2020-09-05 DIAGNOSIS — Z20822 Contact with and (suspected) exposure to covid-19: Secondary | ICD-10-CM | POA: Insufficient documentation

## 2020-09-05 DIAGNOSIS — J069 Acute upper respiratory infection, unspecified: Secondary | ICD-10-CM

## 2020-09-05 DIAGNOSIS — Z79899 Other long term (current) drug therapy: Secondary | ICD-10-CM | POA: Insufficient documentation

## 2020-09-05 DIAGNOSIS — Z881 Allergy status to other antibiotic agents status: Secondary | ICD-10-CM | POA: Insufficient documentation

## 2020-09-05 MED ORDER — BENZONATATE 200 MG PO CAPS: 200.0000 mg | ORAL_CAPSULE | Freq: Three times a day (TID) | ORAL | 0 refills | Status: DC | PRN

## 2020-09-05 NOTE — ED Provider Notes (Signed)
[facility]    CSN: [id number] Arrival date & time: 09/05/20  1258      History   Chief Complaint Chief Complaint  Patient presents with  . Cough  . Generalized Body Aches    HPI Karen [name] is a 65 y.o. female.   Patient presenting today with 1 week history of dry cough and now since yesterday fatigue, generalized mild body aches, scratchy throat, runny nose.  Denies known fever, chills, abdominal pain, chest pain, shortness of breath, nausea vomiting diarrhea.  So far taking Tylenol with minimal relief of symptoms.  No known sick contacts.  Does have history of seasonal allergies but typically only in the fall.     Past Medical History:  Diagnosis Date  . Body aches 03/24/2016   Resolved off simvastatin.   Marland Kitchen CAD (coronary artery disease)    STEMI 07/2013 - treated with PTCA of the RCA followed by DES to the intermediate ramus  //  Myoview 5/17 - Normal perfusion. LVEF 71% with normal wall motion. This is a low risk study.  . Chronic bronchitis   . Cirrhosis ([place])   . Depression   . Diabetes ([place])   . GERD (gastroesophageal reflux disease)   . High blood pressure   . History of chicken pox   . History of ovarian cyst   . HLD (hyperlipidemia)   . Seasonal allergies   . Seizure ([place]) 1977   off AEDs  . SVT (supraventricular tachycardia) ([place]) 10/2015   s/p catheter ablation  . Syncopal episodes   . Tachyarrhythmia    ER visits with adenosine push to control  . Tubal pregnancy   . Type 2 diabetes, uncontrolled, with neuropathy ([place])    dx 1993    Patient Active Problem List   Diagnosis Date Noted  . Welcome to Medicare preventive visit 07/10/2020  . Advanced directives, counseling/discussion 07/10/2020  . Thrombocytopenia ([place]) 11/14/2019  . Bilateral hand pain 10/28/2019  . Cirrhosis of liver ([place]) 11/05/2018  . Chronic constipation 10/10/2018  . Adhesive capsulitis of left shoulder 09/28/2017  . Complete tear of left rotator cuff 09/17/2016  .  Right arm numbness 03/24/2016  . BPPV (benign paroxysmal positional vertigo) 01/11/2016  . Cough due to ACE inhibitor 12/22/2015  . Paroxysmal SVT (supraventricular tachycardia) ([place]) 09/15/2015  . Pain, dental 05/14/2014  . CMC arthritis, thumb, degenerative 02/21/2014  . Type 2 diabetes mellitus with proliferative diabetic retinopathy with macular edema, bilateral ([place]) 12/31/2013  . Dyspnea 10/01/2013  . CAD S/P percutaneous coronary angioplasty 07/23/2013  . Health maintenance examination 06/10/2013  . Vertigo 04/25/2013  . Lower abdominal pain 03/23/2011  . Lumbar radiculopathy 03/16/2011  . MDD (major depressive disorder), recurrent episode, moderate ([place])   . DM (diabetes mellitus), type 2, uncontrolled with complications ([place])   . GERD (gastroesophageal reflux disease)   . Seasonal allergies   . HTN (hypertension)   . Dyslipidemia associated with type 2 diabetes mellitus [facility])     Past Surgical History:  Procedure Laterality Date  . ABLATION OF DYSRHYTHMIC FOCUS  11/06/2015   SVT  . CARDIAC CATHETERIZATION  07/2013   angioplasty to RCA and DES to ramus intermedius  . COLONOSCOPY WITH ESOPHAGOGASTRODUODENOSCOPY (EGD)  04/2019   esophageal varices II, portal hypertensive gastritis, gastric polyp, H pylori neg, 12 TAs, rpt colonoscopy in 1 yr ([facility])  . DILATION AND CURETTAGE OF UTERUS  1984  . ECTOPIC PREGNANCY SURGERY Right 1984   tube removal 2/2 hemorrhage  . ELECTROPHYSIOLOGIC STUDY  N/A 11/06/2015   SVT Ablation;  Evans Lance, MD;  Location: [facility]  . LEFT HEART CATHETERIZATION WITH CORONARY ANGIOGRAM N/A 07/23/2013   Procedure: LEFT HEART CATHETERIZATION WITH CORONARY ANGIOGRAM;  Surgeon: Sinclair Grooms, MD;  [facility] C[facility];  Service: Cardiovascular;  Laterality: N/A;  . OVARIAN CYST SURGERY  1998  . PERCUTANEOUS CORONARY STENT INTERVENTION (PCI-S) N/A 07/24/2013   Procedure: PERCUTANEOUS CORONARY STENT INTERVENTION (PCI-S);  Surgeon: Sinclair Grooms, MD;  Location: [facility] CATH LAB;  Service: Cardiovascular;  Laterality: N/A;  . ROTATOR CUFF REPAIR  2006   Right  . SHOULDER ARTHROSCOPY Left 02/2017   LEFT shoulder arthroscopy with limited debridement, decompression/acromioplasty, open rotator cuff repair and biceps tenodesis ([facility])  . SUBMANDIBULAR DUCT LIGATION Right 10/2014   R submandibular gland stone removed [facility])  . TONSILLECTOMY AND ADENOIDECTOMY  1965    OB History    Gravida  2   Para      Term      Preterm      AB  2   Living        SAB  1   IAB      Ectopic  1   Multiple      Live Births  0            Home Medications    Prior to Admission medications   Medication Sig Start Date End Date Taking? Authorizing Provider  benzonatate (TESSALON) 200 MG capsule Take 1 capsule (200 mg total) by mouth 3 (three) times daily as needed for cough. 09/05/20  Yes Volney American, PA-C  acetaminophen (TYLENOL) 500 MG tablet Take 1,000 mg by mouth 3 (three) times daily as needed for mild pain.    [provider]  aspirin EC 81 MG EC tablet Take 1 tablet (81 mg total) by mouth daily. 07/25/13   Sueanne Margarita, MD  Continuous Blood Gluc Receiver (FREESTYLE LIBRE 2 READER) DEVI Use as directed to check sugars 3-4 times daily 07/10/20   [name], MD  Continuous Blood Gluc Sensor (FREESTYLE LIBRE 2 SENSOR) MISC Use as directed to check sugars 3-4 times daily 07/10/20   [name], MD  dicyclomine (BENTYL) 20 MG tablet Take 1 tablet (20 mg total) by mouth 2 (two) times daily as needed for spasms. 11/05/18   [name], MD  DULoxetine (CYMBALTA) 20 MG capsule Take 1 capsule (20 mg total) by mouth daily. 08/03/20   [name], MD  furosemide (LASIX) 20 MG tablet Take 1 tablet (20 mg total) by mouth daily as needed for edema. 07/31/20   [name], MD  gabapentin (NEURONTIN) 300 MG capsule TAKE ONE CAPSULE BY MOUTH AT BEDTIME 01/15/20   [name], MD  Insulin  Pen Needle (PEN NEEDLES) 31G X 8 MM MISC Use to inject Lantus as directed twice daily. Dx: E11.40 08/18/15   [name], MD  insulin regular (NOVOLIN R) 100 units/mL injection Inject 0.25 mLs (25 Units total) into the skin 3 (three) times daily before meals. + sliding scale insulin 5u for every 50 over 200 03/11/20   [name], MD  KLOR-CON SPRINKLE 10 MEQ CR capsule Take 1 capsule (10 mEq total) by mouth daily as needed. When taking Lasix 07/31/20   [name], MD  LEVEMIR FLEXTOUCH 100 UNIT/ML FlexPen INJECT 60 units into THE SKIN EVERY MORNING AND 65 units EVERY IN THE EVENING 08/04/20   [name], MD  losartan (COZAAR) 25 MG  tablet Take 1 tablet (25 mg total) by mouth daily. 08/24/20   [name], MD  metFORMIN (GLUCOPHAGE-XR) 750 MG 24 hr tablet Take 2 tablets (1,500 mg total) by mouth every evening. 07/31/20   [name], MD  nitroGLYCERIN (NITROSTAT) 0.4 MG SL tablet Place 1 tablet (0.4 mg total) under the tongue every 5 (five) minutes as needed for chest pain. 08/03/20   [name], MD  omeprazole (PRILOSEC) 40 MG capsule TAKE ONE CAPSULE BY MOUTH TWICE A DAY 05/04/20   [name], MD  polyethylene glycol (MIRALAX / GLYCOLAX) 17 g packet Take 17 g by mouth daily as needed.    [provider]  propranolol (INDERAL) 20 MG tablet Take 1 tablet (20 mg total) by mouth 2 (two) times daily. 07/31/20   [name], MD  rosuvastatin (CRESTOR) 10 MG tablet Take 1 tablet (10 mg total) by mouth daily. 07/03/20   Richardson Dopp T, PA-C  Semaglutide,0.25 or 0.5MG /DOS, (OZEMPIC, 0.25 OR 0.5 MG/DOSE,) 2 MG/1.5ML SOPN Inject 0.5 mg into the skin once a week. Inject 0.5mg  weekly    [provider]  spironolactone (ALDACTONE) 25 MG tablet TAKE 1/2 TABLET BY MOUTH DAILY 09/11/19   Belva Crome, MD  traMADol (ULTRAM) 50 MG tablet Take 1 tablet (50 mg total) by mouth daily as needed for moderate pain. 11/06/19   [name], MD    Family  History Family History  Problem Relation Age of Onset  . Diabetes Mother   . Hyperlipidemia Mother   . Hypertension Mother   . Stroke Mother   . Coronary artery disease Mother        CABG  . Heart disease Mother   . Diabetes Father   . Hypertension Father   . Hyperlipidemia Father   . Coronary artery disease Father        CABG  . Congestive Heart Failure Father   . Pancreatic cancer Father   . Arrhythmia Brother   . Diabetes Brother   . Diabetes Maternal Grandmother   . Cirrhosis Maternal Grandmother   . Diabetes Maternal Grandfather   . Diabetes Paternal Grandmother   . Diabetes Paternal Grandfather   . Heart disease Brother   . Diabetes Brother   . Diabetes Sister   . Breast cancer Paternal Aunt   . Kidney disease Neg Hx     Social History Social History   Tobacco Use  . Smoking status: Never Smoker  . Smokeless tobacco: Never Used  Vaping Use  . Vaping Use: Never used  Substance Use Topics  . Alcohol use: No  . Drug use: No     Allergies   Clindamycin/lincomycin, Erythromycin, Lipitor [atorvastatin], and Penicillins   Review of Systems Review of Systems Per HPI Physical Exam Triage Vital Signs ED Triage Vitals  Enc Vitals Group     BP 09/05/20 1349 (!) 126/51     Pulse --      Resp 09/05/20 1349 (!) 21     Temp 09/05/20 1349 98.2 F (36.8 C)     Temp Source 09/05/20 1349 Oral     SpO2 09/05/20 1349 96 %     Weight --      Height --      Head Circumference --      Peak Flow --      Pain Score 09/05/20 1348 7     Pain Loc --      Pain Edu? --      Excl. in [place]? --  No data found.  Updated Vital Signs BP (!) 126/51 (BP Location: Right Arm)   Temp 98.2 F (36.8 C) (Oral)   Resp (!) 21   SpO2 96%   Visual Acuity Right Eye Distance:   Left Eye Distance:   Bilateral Distance:    Right Eye Near:   Left Eye Near:    Bilateral Near:     Physical Exam Vitals and nursing note reviewed.  Constitutional:      Appearance: Normal  appearance. She is not ill-appearing.  HENT:     Head: Atraumatic.     Right Ear: Tympanic membrane normal.     Left Ear: Tympanic membrane normal.     Nose: Rhinorrhea present.     Mouth/Throat:     Mouth: Mucous membranes are moist.     Pharynx: Posterior oropharyngeal erythema present. No oropharyngeal exudate.  Eyes:     Extraocular Movements: Extraocular movements intact.     Conjunctiva/sclera: Conjunctivae normal.  Cardiovascular:     Rate and Rhythm: Normal rate and regular rhythm.     Heart sounds: Normal heart sounds.  Pulmonary:     Effort: Pulmonary effort is normal. No respiratory distress.     Breath sounds: Normal breath sounds. No wheezing or rales.  Abdominal:     General: Bowel sounds are normal. There is no distension.     Palpations: Abdomen is soft.     Tenderness: There is no abdominal tenderness. There is no guarding.  Musculoskeletal:        General: Normal range of motion.     Cervical back: Normal range of motion and neck supple.  Skin:    General: Skin is warm and dry.  Neurological:     Mental Status: She is alert and oriented to person, place, and time.  Psychiatric:        Mood and Affect: Mood normal.        Thought Content: Thought content normal.        Judgment: Judgment normal.      UC Treatments / Results  Labs (all labs ordered are listed, but only abnormal results are displayed) Labs Reviewed  SARS CORONAVIRUS 2 (TAT 6-24 HRS)    EKG   Radiology No results found.  Procedures Procedures (including critical care time)  Medications Ordered in UC Medications - No data to display  Initial Impression / Assessment and Plan / UC Course  I have reviewed the triage vital signs and the nursing notes.  Pertinent labs & imaging results that were available during my care of the patient were reviewed by me and considered in my medical decision making (see chart for details).     Vitals and exam reassuring, COVID PCR pending.   Discussed isolation until results return.  Will give Tessalon Perles and recommended Coricidin HBP, Zyrtec, Flonase for symptomatic care.  Return for acutely worsening symptoms.  Final Clinical Impressions(s) / UC Diagnoses   Final diagnoses:  Viral URI with cough     Discharge Instructions     May try Coricidin HBP, Zyrtec, Flonase for symptoms    ED Prescriptions    Medication Sig Dispense Auth. Provider   benzonatate (TESSALON) 200 MG capsule Take 1 capsule (200 mg total) by mouth 3 (three) times daily as needed for cough. 21 capsule Volney American, [place]     PDMP not reviewed this encounter.   [place], [place], [place] 09/05/20 1455

## 2020-09-05 NOTE — ED Triage Notes (Signed)
Pt presents with cough x 1 week,. Scratchy throat and body aches since this morning .Denies fever, chills.

## 2020-09-05 NOTE — Discharge Instructions (Addendum)
May try Coricidin HBP, Zyrtec, Flonase for symptoms

## 2020-09-06 LAB — SARS CORONAVIRUS 2 (TAT 6-24 HRS): SARS Coronavirus 2: NEGATIVE

## 2020-09-11 ENCOUNTER — Ambulatory Visit (INDEPENDENT_AMBULATORY_CARE_PROVIDER_SITE_OTHER): Payer: HMO | Admitting: Family Medicine

## 2020-09-11 ENCOUNTER — Encounter: Payer: Self-pay | Admitting: Family Medicine

## 2020-09-11 ENCOUNTER — Other Ambulatory Visit: Payer: Self-pay

## 2020-09-11 VITALS — BP 118/62 | HR 68 | Temp 97.7°F | Ht 60.5 in | Wt 178.5 lb

## 2020-09-11 DIAGNOSIS — E1165 Type 2 diabetes mellitus with hyperglycemia: Secondary | ICD-10-CM | POA: Diagnosis not present

## 2020-09-11 DIAGNOSIS — Z794 Long term (current) use of insulin: Secondary | ICD-10-CM | POA: Diagnosis not present

## 2020-09-11 DIAGNOSIS — I358 Other nonrheumatic aortic valve disorders: Secondary | ICD-10-CM

## 2020-09-11 DIAGNOSIS — E113513 Type 2 diabetes mellitus with proliferative diabetic retinopathy with macular edema, bilateral: Secondary | ICD-10-CM

## 2020-09-11 DIAGNOSIS — IMO0002 Reserved for concepts with insufficient information to code with codable children: Secondary | ICD-10-CM

## 2020-09-11 DIAGNOSIS — E118 Type 2 diabetes mellitus with unspecified complications: Secondary | ICD-10-CM

## 2020-09-11 LAB — POCT GLYCOSYLATED HEMOGLOBIN (HGB A1C): Hemoglobin A1C: 8.1 % — AB (ref 4.0–5.6)

## 2020-09-11 MED ORDER — TRESIBA FLEXTOUCH 200 UNIT/ML ~~LOC~~ SOPN: 100.0000 [IU] | PEN_INJECTOR | Freq: Every day | SUBCUTANEOUS | 5 refills | Status: DC

## 2020-09-11 NOTE — Progress Notes (Incomplete)
Patient ID: Karen [name], female    DOB: 14-Feb-1956, 65 y.o.   MRN: [id number]  This visit was conducted in person.  BP 118/62   Pulse 68   Temp 97.7 F (36.5 C) (Temporal)   Ht 5' 0.5" (1.537 m)   Wt 178 lb 8 oz (81 kg)   SpO2 96%   BMI 34.29 kg/m    CC: DM f/u  Subjective:   HPI: Karen [name] is a 65 y.o. female presenting on 09/11/2020 for No chief complaint on file.   Seen at ER over weekend with viral URI with cough treated with tessalon perls. COVID test negative.   DM - does regularly check sugars and brings log - today am 110, yesterday 280-360 (splurged on food), prior . Compliant with antihyperglycemic regimen which includes: levemir 60/65u daily, novolin R 25u TID with meals + SSI 5u for every 50 >200, metformin XR 1500mg  nightly, ozempic 0.5mg  weekly (tolerating well). Denies low sugars or hypoglycemic symptoms. Denies paresthesias. Last diabetic eye exam 06/2020. Glucometer brand: [employer] - hasn't started using yet as had a sensor issue. DSME: will refer. Lab Results  Component Value Date   HGBA1C 8.1 (A) 09/11/2020   Diabetic Foot Exam - Simple   No data filed    Lab Results  Component Value Date   MICROALBUR 3.3 (H) 07/02/2015         Relevant past medical, surgical, family and social history reviewed and updated as indicated. Interim medical history since our last visit reviewed. Allergies and medications reviewed and updated. Outpatient Medications Prior to Visit  Medication Sig Dispense Refill  . acetaminophen (TYLENOL) 500 MG tablet Take 1,000 mg by mouth 3 (three) times daily as needed for mild pain.    Marland Kitchen aspirin EC 81 MG EC tablet Take 1 tablet (81 mg total) by mouth daily.    . benzonatate (TESSALON) 200 MG capsule Take 1 capsule (200 mg total) by mouth 3 (three) times daily as needed for cough. 21 capsule 0  . Continuous Blood Gluc Receiver (FREESTYLE LIBRE 2 READER) DEVI Use as directed to check sugars 3-4 times daily 1 each 0  .  Continuous Blood Gluc Sensor (FREESTYLE LIBRE 2 SENSOR) MISC Use as directed to check sugars 3-4 times daily 6 each 3  . dicyclomine (BENTYL) 20 MG tablet Take 1 tablet (20 mg total) by mouth 2 (two) times daily as needed for spasms. 45 tablet 3  . DULoxetine (CYMBALTA) 20 MG capsule Take 1 capsule (20 mg total) by mouth daily. 30 capsule 3  . furosemide (LASIX) 20 MG tablet Take 1 tablet (20 mg total) by mouth daily as needed for edema. 30 tablet 11  . gabapentin (NEURONTIN) 300 MG capsule TAKE ONE CAPSULE BY MOUTH AT BEDTIME 90 capsule 3  . Insulin Pen Needle (PEN NEEDLES) 31G X 8 MM MISC Use to inject Lantus as directed twice daily. Dx: E11.40 60 each 3  . insulin regular (NOVOLIN R) 100 units/mL injection Inject 0.25 mLs (25 Units total) into the skin 3 (three) times daily before meals. + sliding scale insulin 5u for every 50 over 200 10 mL   . KLOR-CON SPRINKLE 10 MEQ CR capsule Take 1 capsule (10 mEq total) by mouth daily as needed. When taking Lasix 30 capsule 11  . LEVEMIR FLEXTOUCH 100 UNIT/ML FlexPen INJECT 60 units into THE SKIN EVERY MORNING AND 65 units EVERY IN THE EVENING 15 mL 1  . losartan (COZAAR) 25 MG tablet Take  1 tablet (25 mg total) by mouth daily. 90 tablet 3  . metFORMIN (GLUCOPHAGE-XR) 750 MG 24 hr tablet Take 2 tablets (1,500 mg total) by mouth every evening. 180 tablet 1  . nitroGLYCERIN (NITROSTAT) 0.4 MG SL tablet Place 1 tablet (0.4 mg total) under the tongue every 5 (five) minutes as needed for chest pain. 25 tablet 0  . omeprazole (PRILOSEC) 40 MG capsule TAKE ONE CAPSULE BY MOUTH TWICE A DAY 60 capsule 5  . polyethylene glycol (MIRALAX / GLYCOLAX) 17 g packet Take 17 g by mouth daily as needed.    . propranolol (INDERAL) 20 MG tablet Take 1 tablet (20 mg total) by mouth 2 (two) times daily. 180 tablet 3  . rosuvastatin (CRESTOR) 10 MG tablet Take 1 tablet (10 mg total) by mouth daily. 90 tablet 3  . Semaglutide,0.25 or 0.5MG /DOS, (OZEMPIC, 0.25 OR 0.5 MG/DOSE,) 2  MG/1.5ML SOPN Inject 0.5 mg into the skin once a week. Inject 0.5mg  weekly    . spironolactone (ALDACTONE) 25 MG tablet TAKE 1/2 TABLET BY MOUTH DAILY 45 tablet 3  . traMADol (ULTRAM) 50 MG tablet Take 1 tablet (50 mg total) by mouth daily as needed for moderate pain. 30 tablet 0   Facility-Administered Medications Prior to Visit  Medication Dose Route Frequency Provider Last Rate Last Admin  . 0.9 %  sodium chloride infusion  500 mL Intravenous Once [facility], Carlota Raspberry, MD         Per HPI unless specifically indicated in ROS section below Review of Systems Objective:  BP 118/62   Pulse 68   Temp 97.7 F (36.5 C) (Temporal)   Ht 5' 0.5" (1.537 m)   Wt 178 lb 8 oz (81 kg)   SpO2 96%   BMI 34.29 kg/m   Wt Readings from Last 3 Encounters:  09/11/20 178 lb 8 oz (81 kg)  07/10/20 177 lb 6 oz (80.5 kg)  07/03/20 175 lb (79.4 kg)      Physical Exam Vitals and nursing note reviewed.  Constitutional:      Appearance: Normal appearance. She is not ill-appearing.  Eyes:     Extraocular Movements: Extraocular movements intact.     Pupils: Pupils are equal, round, and reactive to light.  Cardiovascular:     Rate and Rhythm: Normal rate and regular rhythm.     Pulses: Normal pulses.     Heart sounds: Murmur (mild 2/6 systolic) heard.    Pulmonary:     Effort: Pulmonary effort is normal. No respiratory distress.     Breath sounds: Normal breath sounds. No wheezing, rhonchi or rales.  Musculoskeletal:     Right lower leg: No edema.     Left lower leg: No edema.  Neurological:     Mental Status: She is alert.  Psychiatric:        Mood and Affect: Mood normal.        Behavior: Behavior normal.       Results for orders placed or performed in visit on 09/11/20  POCT glycosylated hemoglobin (Hb A1C)  Result Value Ref Range   Hemoglobin A1C 8.1 (A) 4.0 - 5.6 %   HbA1c POC (<> result, manual entry)     HbA1c, POC (prediabetic range)     HbA1c, POC (controlled diabetic range)      Assessment & Plan:  This visit occurred during the SARS-CoV-2 public health emergency.  Safety protocols were in place, including screening questions prior to the visit, additional usage of staff PPE, and  extensive cleaning of exam room while observing appropriate contact time as indicated for disinfecting solutions.   Problem List Items Addressed This Visit    DM (diabetes mellitus), type 2, uncontrolled with complications ([place]) - Primary   Relevant Medications   insulin degludec (TRESIBA FLEXTOUCH) 200 UNIT/ML FlexTouch Pen   Other Relevant Orders   POCT glycosylated hemoglobin (Hb A1C) (Completed)   Ambulatory referral to diabetic education       Meds ordered this encounter  Medications  . insulin degludec (TRESIBA FLEXTOUCH) 200 UNIT/ML FlexTouch Pen    Sig: Inject 100 Units into the skin daily.    Dispense:  15 mL    Refill:  5    To replace levemir   Orders Placed This Encounter  Procedures  . Ambulatory referral to diabetic education    Referral Priority:   Routine    Referral Type:   Consultation    Referral Reason:   Specialty Services Required    Number of Visits Requested:   1  . POCT glycosylated hemoglobin (Hb A1C)    Patient Instructions  Schedule nurse visit for continuous glucose meter teaching.  We will refer you to diabetes education classes.  Ok to try tresiba 100u nightly in place of levemir. Let me know how readings run with new insulin - we will likely need to continue tapering dose if fasting sugars remain high.  Let us know when running low on ozempic to increase dose to 1mg  weekly.  Keep upcoming appointment for next month.    Follow up plan: Return in about 4 weeks (around 10/09/2020) for follow up visit.  [name], MD

## 2020-09-11 NOTE — Progress Notes (Signed)
Patient ID: Karen [name], female    DOB: December 21, 1955, 65 y.o.   MRN: [id number]  This visit was conducted in person.  BP 118/62   Pulse 68   Temp 97.7 F (36.5 C) (Temporal)   Ht 5' 0.5" (1.537 m)   Wt 178 lb 8 oz (81 kg)   SpO2 96%   BMI 34.29 kg/m    CC: DM f/u  Subjective:   HPI: Karen [name] is a 65 y.o. female presenting on 09/11/2020 for No chief complaint on file.   Seen at ER over weekend with viral URI with cough treated with tessalon perls. COVID test negative.   DM - does regularly check sugars and brings log - today am 110, yesterday 280-360 (splurged on food), prior 200-300s, peak 460s. Compliant with antihyperglycemic regimen which includes: levemir 60/65u daily, novolin R 25u TID with meals + SSI 5u for every 50 >200, metformin XR 1500mg  nightly, ozempic 0.5mg  weekly (tolerating well, difficulty previously tolerating 1mg  dose). Denies low sugars or hypoglycemic symptoms. Denies paresthesias. Last diabetic eye exam 06/2020. Glucometer brand: [employer] - hasn't started using yet as had a sensor issue. DSME: will refer. Lab Results  Component Value Date   HGBA1C 8.1 (A) 09/11/2020   Diabetic Foot Exam - Simple   Simple Foot Form Diabetic Foot exam was performed with the following findings: Yes 09/11/2020 11:44 PM  Visual Inspection No deformities, no ulcerations, no other skin breakdown bilaterally: Yes Sensation Testing Intact to touch and monofilament testing bilaterally: Yes Pulse Check Posterior Tibialis and Dorsalis pulse intact bilaterally: Yes Comments    Lab Results  Component Value Date   MICROALBUR 3.3 (H) 07/02/2015         Relevant past medical, surgical, family and social history reviewed and updated as indicated. Interim medical history since our last visit reviewed. Allergies and medications reviewed and updated. Outpatient Medications Prior to Visit  Medication Sig Dispense Refill  . acetaminophen (TYLENOL) 500 MG tablet Take 1,000  mg by mouth 3 (three) times daily as needed for mild pain.    Marland Kitchen aspirin EC 81 MG EC tablet Take 1 tablet (81 mg total) by mouth daily.    . benzonatate (TESSALON) 200 MG capsule Take 1 capsule (200 mg total) by mouth 3 (three) times daily as needed for cough. 21 capsule 0  . Continuous Blood Gluc Receiver (FREESTYLE LIBRE 2 READER) DEVI Use as directed to check sugars 3-4 times daily 1 each 0  . Continuous Blood Gluc Sensor (FREESTYLE LIBRE 2 SENSOR) MISC Use as directed to check sugars 3-4 times daily 6 each 3  . dicyclomine (BENTYL) 20 MG tablet Take 1 tablet (20 mg total) by mouth 2 (two) times daily as needed for spasms. 45 tablet 3  . DULoxetine (CYMBALTA) 20 MG capsule Take 1 capsule (20 mg total) by mouth daily. 30 capsule 3  . furosemide (LASIX) 20 MG tablet Take 1 tablet (20 mg total) by mouth daily as needed for edema. 30 tablet 11  . gabapentin (NEURONTIN) 300 MG capsule TAKE ONE CAPSULE BY MOUTH AT BEDTIME 90 capsule 3  . Insulin Pen Needle (PEN NEEDLES) 31G X 8 MM MISC Use to inject Lantus as directed twice daily. Dx: E11.40 60 each 3  . insulin regular (NOVOLIN R) 100 units/mL injection Inject 0.25 mLs (25 Units total) into the skin 3 (three) times daily before meals. + sliding scale insulin 5u for every 50 over 200 10 mL   . KLOR-CON SPRINKLE  10 MEQ CR capsule Take 1 capsule (10 mEq total) by mouth daily as needed. When taking Lasix 30 capsule 11  . LEVEMIR FLEXTOUCH 100 UNIT/ML FlexPen INJECT 60 units into THE SKIN EVERY MORNING AND 65 units EVERY IN THE EVENING 15 mL 1  . losartan (COZAAR) 25 MG tablet Take 1 tablet (25 mg total) by mouth daily. 90 tablet 3  . metFORMIN (GLUCOPHAGE-XR) 750 MG 24 hr tablet Take 2 tablets (1,500 mg total) by mouth every evening. 180 tablet 1  . nitroGLYCERIN (NITROSTAT) 0.4 MG SL tablet Place 1 tablet (0.4 mg total) under the tongue every 5 (five) minutes as needed for chest pain. 25 tablet 0  . omeprazole (PRILOSEC) 40 MG capsule TAKE ONE CAPSULE BY  MOUTH TWICE A DAY 60 capsule 5  . polyethylene glycol (MIRALAX / GLYCOLAX) 17 g packet Take 17 g by mouth daily as needed.    . propranolol (INDERAL) 20 MG tablet Take 1 tablet (20 mg total) by mouth 2 (two) times daily. 180 tablet 3  . rosuvastatin (CRESTOR) 10 MG tablet Take 1 tablet (10 mg total) by mouth daily. 90 tablet 3  . Semaglutide,0.25 or 0.5MG /DOS, (OZEMPIC, 0.25 OR 0.5 MG/DOSE,) 2 MG/1.5ML SOPN Inject 0.5 mg into the skin once a week. Inject 0.5mg  weekly    . spironolactone (ALDACTONE) 25 MG tablet TAKE 1/2 TABLET BY MOUTH DAILY 45 tablet 3  . traMADol (ULTRAM) 50 MG tablet Take 1 tablet (50 mg total) by mouth daily as needed for moderate pain. 30 tablet 0   Facility-Administered Medications Prior to Visit  Medication Dose Route Frequency Provider Last Rate Last Admin  . 0.9 %  sodium chloride infusion  500 mL Intravenous Once Armbruster, Carlota Raspberry, MD         Per HPI unless specifically indicated in ROS section below Review of Systems Objective:  BP 118/62   Pulse 68   Temp 97.7 F (36.5 C) (Temporal)   Ht 5' 0.5" (1.537 m)   Wt 178 lb 8 oz (81 kg)   SpO2 96%   BMI 34.29 kg/m   Wt Readings from Last 3 Encounters:  09/11/20 178 lb 8 oz (81 kg)  07/10/20 177 lb 6 oz (80.5 kg)  07/03/20 175 lb (79.4 kg)      Physical Exam Vitals and nursing note reviewed.  Constitutional:      General: She is not in acute distress.    Appearance: Normal appearance. She is well-developed. She is not ill-appearing.  Eyes:     General: No scleral icterus.    Extraocular Movements: Extraocular movements intact.     Conjunctiva/sclera: Conjunctivae normal.     Pupils: Pupils are equal, round, and reactive to light.  Cardiovascular:     Rate and Rhythm: Normal rate and regular rhythm.     Pulses: Normal pulses.     Heart sounds: Murmur (mild 2/6 systolic) heard.    Pulmonary:     Effort: Pulmonary effort is normal. No respiratory distress.     Breath sounds: Normal breath sounds.  No wheezing, rhonchi or rales.  Musculoskeletal:     Cervical back: Normal range of motion and neck supple.     Right lower leg: No edema.     Left lower leg: No edema.     Comments: See HPI for foot exam if done  Lymphadenopathy:     Cervical: No cervical adenopathy.  Skin:    General: Skin is warm and dry.     Findings: No  rash.  Neurological:     Mental Status: She is alert.  Psychiatric:        Mood and Affect: Mood normal.        Behavior: Behavior normal.       Results for orders placed or performed in visit on 09/11/20  POCT glycosylated hemoglobin (Hb A1C)  Result Value Ref Range   Hemoglobin A1C 8.1 (A) 4.0 - 5.6 %   HbA1c POC (<> result, manual entry)     HbA1c, POC (prediabetic range)     HbA1c, POC (controlled diabetic range)     Assessment & Plan:  This visit occurred during the SARS-CoV-2 public health emergency.  Safety protocols were in place, including screening questions prior to the visit, additional usage of staff PPE, and extensive cleaning of exam room while observing appropriate contact time as indicated for disinfecting solutions.   Problem List Items Addressed This Visit    DM (diabetes mellitus), type 2, uncontrolled with complications ([place]) - Primary    Chronic, remains uncontrolled based on cbg readings from glucose monitoring program. However A1c is improving (down to 8.1). Will transition from levemir to tresiba hopeful for longer half life as well as more concentrated form (200u/mL). Will start lower dose given change (drop to about 90% of previous dose) - start 100 U daily.  Has not yet started freestyle libre use - advised to schedule nurse visit for CGM teaching through our office or to talk with pharmacist.  Will also refer to diabetes education classes now she has insurance.  Discussed possible retrial of higher ozempic dose once she runs out of current regimen. RTC 1 mo f/u visit. Pt agrees with plan.       Relevant Medications   insulin  degludec (TRESIBA FLEXTOUCH) 200 UNIT/ML FlexTouch Pen   Other Relevant Orders   POCT glycosylated hemoglobin (Hb A1C) (Completed)   Ambulatory referral to diabetic education   Type 2 diabetes mellitus with proliferative diabetic retinopathy with macular edema, bilateral ([place])    Appreciate ophthalmology care.       Relevant Medications   insulin degludec (TRESIBA FLEXTOUCH) 200 UNIT/ML FlexTouch Pen   Aortic valve sclerosis    Reviewed latest [place] with pt.          Meds ordered this encounter  Medications  . insulin degludec (TRESIBA FLEXTOUCH) 200 UNIT/ML FlexTouch Pen    Sig: Inject 100 Units into the skin daily.    Dispense:  15 mL    Refill:  5    To replace levemir   Orders Placed This Encounter  Procedures  . Ambulatory referral to diabetic education    Referral Priority:   Routine    Referral Type:   Consultation    Referral Reason:   Specialty Services Required    Number of Visits Requested:   1  . POCT glycosylated hemoglobin (Hb A1C)    Patient Instructions  Schedule nurse visit for continuous glucose meter teaching.  We will refer you to diabetes education classes.  Ok to try tresiba 100u nightly in place of levemir. Let me know how readings run with new insulin - we will likely need to continue tapering dose if fasting sugars remain high.  Let us know when running low on ozempic to increase dose to 1mg  weekly.  Keep upcoming appointment for next month.    Follow up plan: Return in about 4 weeks (around 10/09/2020) for follow up visit.  [name], MD

## 2020-09-11 NOTE — Patient Instructions (Addendum)
Schedule nurse visit for continuous glucose meter teaching.  We will refer you to diabetes education classes.  Ok to try tresiba 100u nightly in place of levemir. Let me know how readings run with new insulin - we will likely need to continue tapering dose if fasting sugars remain high.  Let us know when running low on ozempic to increase dose to 1mg  weekly.  Keep upcoming appointment for next month.

## 2020-09-12 DIAGNOSIS — I358 Other nonrheumatic aortic valve disorders: Secondary | ICD-10-CM | POA: Insufficient documentation

## 2020-09-12 NOTE — Assessment & Plan Note (Signed)
Reviewed latest [place] with pt.

## 2020-09-12 NOTE — Assessment & Plan Note (Signed)
Appreciate ophthalmology care.  

## 2020-09-12 NOTE — Assessment & Plan Note (Signed)
Chronic, remains uncontrolled based on cbg readings from glucose monitoring program. However A1c is improving (down to 8.1). Will transition from levemir to tresiba hopeful for longer half life as well as more concentrated form (200u/mL). Will start lower dose given change (drop to about 90% of previous dose) - start 100 U daily.  Has not yet started freestyle libre use - advised to schedule nurse visit for CGM teaching through our office or to talk with pharmacist.  Will also refer to diabetes education classes now she has insurance.  Discussed possible retrial of higher ozempic dose once she runs out of current regimen. RTC 1 mo f/u visit. Pt agrees with plan.

## 2020-09-15 ENCOUNTER — Ambulatory Visit: Payer: HMO | Admitting: Family Medicine

## 2020-09-20 ENCOUNTER — Encounter: Payer: Self-pay | Admitting: Family Medicine

## 2020-09-25 ENCOUNTER — Encounter: Payer: Self-pay | Admitting: Family Medicine

## 2020-09-25 ENCOUNTER — Other Ambulatory Visit: Payer: Self-pay | Admitting: Family Medicine

## 2020-09-25 MED ORDER — TRESIBA FLEXTOUCH 200 UNIT/ML ~~LOC~~ SOPN: 100.0000 [IU] | PEN_INJECTOR | Freq: Every day | SUBCUTANEOUS | 0 refills | Status: DC

## 2020-09-25 NOTE — Telephone Encounter (Signed)
E-scribed refill 

## 2020-09-25 NOTE — Telephone Encounter (Signed)
Refill request Spironolactone Last office visit 09/11/20 Last refill 09/11/19 #45/3 See drug warning with Klor-Con and Losartan

## 2020-09-30 ENCOUNTER — Other Ambulatory Visit: Payer: HMO

## 2020-10-01 ENCOUNTER — Other Ambulatory Visit: Payer: HMO

## 2020-10-01 ENCOUNTER — Ambulatory Visit: Payer: HMO | Admitting: Podiatry

## 2020-10-02 DIAGNOSIS — E113513 Type 2 diabetes mellitus with proliferative diabetic retinopathy with macular edema, bilateral: Secondary | ICD-10-CM | POA: Diagnosis not present

## 2020-10-02 DIAGNOSIS — H4311 Vitreous hemorrhage, right eye: Secondary | ICD-10-CM | POA: Diagnosis not present

## 2020-10-09 ENCOUNTER — Ambulatory Visit: Payer: HMO | Admitting: Family Medicine

## 2020-10-14 ENCOUNTER — Ambulatory Visit: Payer: HMO | Admitting: Family Medicine

## 2020-10-16 ENCOUNTER — Ambulatory Visit (AMBULATORY_SURGERY_CENTER): Payer: Self-pay | Admitting: *Deleted

## 2020-10-16 ENCOUNTER — Other Ambulatory Visit: Payer: Self-pay | Admitting: Family Medicine

## 2020-10-16 VITALS — Ht 65.0 in | Wt 179.0 lb

## 2020-10-16 DIAGNOSIS — Z86010 Personal history of colonic polyps: Secondary | ICD-10-CM

## 2020-10-16 NOTE — Progress Notes (Signed)
Patient came into pre-visit today asking about blood work that she needs and wants to repeat the EGD at time of colonoscopy. As I reviewed her last colon/EGD pathology results Dr.Armbruster states he wants to see her back for an OV. She never came back because she did not have insurance per pt. She has insurance now and wants to have the liver and cirrhosis check up. I made an office visit for her first available and canceled the colonoscopy at this time. Pt is aware. Placed patient on the wait list per her request.

## 2020-10-20 ENCOUNTER — Other Ambulatory Visit: Payer: Self-pay

## 2020-10-20 ENCOUNTER — Ambulatory Visit (INDEPENDENT_AMBULATORY_CARE_PROVIDER_SITE_OTHER): Payer: HMO | Admitting: Podiatry

## 2020-10-20 ENCOUNTER — Encounter: Payer: Self-pay | Admitting: Podiatry

## 2020-10-20 ENCOUNTER — Other Ambulatory Visit: Payer: Self-pay | Admitting: Family Medicine

## 2020-10-20 DIAGNOSIS — D2372 Other benign neoplasm of skin of left lower limb, including hip: Secondary | ICD-10-CM

## 2020-10-20 DIAGNOSIS — B351 Tinea unguium: Secondary | ICD-10-CM

## 2020-10-20 DIAGNOSIS — E1142 Type 2 diabetes mellitus with diabetic polyneuropathy: Secondary | ICD-10-CM | POA: Diagnosis not present

## 2020-10-20 DIAGNOSIS — D2371 Other benign neoplasm of skin of right lower limb, including hip: Secondary | ICD-10-CM | POA: Diagnosis not present

## 2020-10-20 DIAGNOSIS — M79676 Pain in unspecified toe(s): Secondary | ICD-10-CM | POA: Diagnosis not present

## 2020-10-20 NOTE — Progress Notes (Signed)
She presents today chief complaint of painfully elongated toenails and painful calluses plantar aspect of bilateral foot.  Objective: Vitals are stable alert oriented x3 pulses are palpable.  There is no erythema Insil history General multiple reactive hyperkeratotic lesions beneath each individual metatarsal head bilaterally.  No preulcerative lesions no open wounds.  Pain limb secondary to thick yellow dystrophic clinical mycotic nails  Assessment: Pain in limb secondary to painful onychomycosis and benign skin lesions.  Plan: Debrided benign skin lesions bilaterally and debrided toenails 1 through 5 bilateral.

## 2020-10-21 NOTE — Telephone Encounter (Signed)
Gabapentin Last filled:  08/24/20, #90 Last OV:  09/11/20, DM f/u Next OV:  10/26/20, 1 mo DM f/u

## 2020-10-26 ENCOUNTER — Encounter: Payer: Self-pay | Admitting: Family Medicine

## 2020-10-26 ENCOUNTER — Ambulatory Visit (INDEPENDENT_AMBULATORY_CARE_PROVIDER_SITE_OTHER): Payer: HMO | Admitting: Family Medicine

## 2020-10-26 ENCOUNTER — Other Ambulatory Visit: Payer: Self-pay

## 2020-10-26 VITALS — BP 118/64 | HR 82 | Temp 97.6°F | Ht 65.0 in | Wt 183.0 lb

## 2020-10-26 DIAGNOSIS — E113513 Type 2 diabetes mellitus with proliferative diabetic retinopathy with macular edema, bilateral: Secondary | ICD-10-CM

## 2020-10-26 DIAGNOSIS — K7469 Other cirrhosis of liver: Secondary | ICD-10-CM

## 2020-10-26 DIAGNOSIS — E118 Type 2 diabetes mellitus with unspecified complications: Secondary | ICD-10-CM | POA: Diagnosis not present

## 2020-10-26 DIAGNOSIS — F331 Major depressive disorder, recurrent, moderate: Secondary | ICD-10-CM

## 2020-10-26 DIAGNOSIS — Z23 Encounter for immunization: Secondary | ICD-10-CM | POA: Diagnosis not present

## 2020-10-26 DIAGNOSIS — E1165 Type 2 diabetes mellitus with hyperglycemia: Secondary | ICD-10-CM | POA: Diagnosis not present

## 2020-10-26 DIAGNOSIS — Z794 Long term (current) use of insulin: Secondary | ICD-10-CM

## 2020-10-26 DIAGNOSIS — IMO0002 Reserved for concepts with insufficient information to code with codable children: Secondary | ICD-10-CM

## 2020-10-26 MED ORDER — OMEPRAZOLE 40 MG PO CPDR: 1.0000 | DELAYED_RELEASE_CAPSULE | Freq: Two times a day (BID) | ORAL | 3 refills | Status: DC

## 2020-10-26 MED ORDER — DICYCLOMINE HCL 20 MG PO TABS: 20.0000 mg | ORAL_TABLET | Freq: Two times a day (BID) | ORAL | 3 refills | Status: DC | PRN

## 2020-10-26 MED ORDER — TRESIBA FLEXTOUCH 200 UNIT/ML ~~LOC~~ SOPN: 100.0000 [IU] | PEN_INJECTOR | Freq: Every day | SUBCUTANEOUS | 11 refills | Status: DC

## 2020-10-26 MED ORDER — DULOXETINE HCL 20 MG PO CPEP: 20.0000 mg | ORAL_CAPSULE | Freq: Every day | ORAL | 3 refills | Status: DC

## 2020-10-26 NOTE — Assessment & Plan Note (Addendum)
Chronic, uncontrolled, pt endorses deteriorated control attributed to increased sugar/sweets over the past month. She is ready to return to low sugar low carb diabetic diet. She also has upcoming nutritionist referral. Continue current regimen, I have refilled tresiba 3 month supply per pt request. 3 pens come per box.  She will return next week for nurse visit for CGM teaching. RTC 6 wks f/u visit DM

## 2020-10-26 NOTE — Assessment & Plan Note (Signed)
Had eye injection last month.

## 2020-10-26 NOTE — Assessment & Plan Note (Signed)
Stable period on cymbalta 20mg  daily - continue.

## 2020-10-26 NOTE — Progress Notes (Signed)
Patient ID: Karen [name], female    DOB: [birth year], 65 y.o.   MRN: [id number]  This visit was conducted in person.  BP 118/64   Pulse 82   Temp 97.6 F (36.4 C) (Temporal)   Ht 5\' 5"  (1.651 m)   Wt 183 lb (83 kg)   SpO2 95%   BMI 30.45 kg/m    CC: 1 mo DM f/u visit  Subjective:   HPI: Karen [name] is a 65 y.o. female presenting on 10/26/2020 for Diabetes (Here for 1 mo DM f/u.)   See prior note for details.   Upcoming GI follow up with Dr Havery Moros. Requests Rx bentyl for abdominal cramping. This works well.   DM - does regularly check sugars: high 200-300s (see below for reason). Compliant with antihyperglycemic regimen which includes: tresiba 100u daily, novolin R 25u TID + SSI 5u for every 50>200, metformin XR 1500 daily, ozempic 0.5mg  weekly (trouble tolerating higher dose). Bad month - she splurged on sweets but has finished them all. Denies low sugars or hypoglycemic symptoms. Denies paresthesias. Last diabetic eye exam 08/2020. Pneumovax: 2015. Prevnar: DUE. Glucometer brand: freestyle libre CGM - needs to schedule CGM teaching. DSME: referred last visit - appt scheduled for end of this month.  Lab Results  Component Value Date   HGBA1C 8.1 (A) 09/11/2020   Diabetic Foot Exam - Simple   No data filed    Lab Results  Component Value Date   MICROALBUR 3.3 (H) 07/02/2015         Relevant past medical, surgical, family and social history reviewed and updated as indicated. Interim medical history since our last visit reviewed. Allergies and medications reviewed and updated. Outpatient Medications Prior to Visit  Medication Sig Dispense Refill   acetaminophen (TYLENOL) 500 MG tablet Take 1,000 mg by mouth 3 (three) times daily as needed for mild pain.     aspirin EC 81 MG EC tablet Take 1 tablet (81 mg total) by mouth daily.     Continuous Blood Gluc Receiver (FREESTYLE LIBRE 2 READER) DEVI Use as directed to check sugars 3-4 times daily 1 each 0   Continuous  Blood Gluc Sensor (FREESTYLE LIBRE 2 SENSOR) MISC Use as directed to check sugars 3-4 times daily 6 each 3   furosemide (LASIX) 20 MG tablet Take 1 tablet (20 mg total) by mouth daily as needed for edema. 30 tablet 11   gabapentin (NEURONTIN) 300 MG capsule TAKE ONE CAPSULE BY MOUTH AT BEDTIME 90 capsule 1   Insulin Pen Needle (PEN NEEDLES) 31G X 8 MM MISC Use to inject Lantus as directed twice daily. Dx: E11.40 60 each 3   insulin regular (NOVOLIN R) 100 units/mL injection Inject 0.25 mLs (25 Units total) into the skin 3 (three) times daily before meals. + sliding scale insulin 5u for every 50 over 200 10 mL    KLOR-CON SPRINKLE 10 MEQ CR capsule Take 1 capsule (10 mEq total) by mouth daily as needed. When taking Lasix 30 capsule 11   losartan (COZAAR) 25 MG tablet Take 1 tablet (25 mg total) by mouth daily. 90 tablet 3   metFORMIN (GLUCOPHAGE-XR) 750 MG 24 hr tablet Take 2 tablets (1,500 mg total) by mouth every evening. 180 tablet 1   nitroGLYCERIN (NITROSTAT) 0.4 MG SL tablet Place 1 tablet (0.4 mg total) under the tongue every 5 (five) minutes as needed for chest pain. 25 tablet 0   polyethylene glycol (MIRALAX / GLYCOLAX) 17 g packet Take  17 g by mouth daily as needed.     propranolol (INDERAL) 20 MG tablet Take 1 tablet (20 mg total) by mouth 2 (two) times daily. 180 tablet 3   rosuvastatin (CRESTOR) 10 MG tablet Take 1 tablet (10 mg total) by mouth daily. 90 tablet 3   Semaglutide,0.25 or 0.5MG /DOS, (OZEMPIC, 0.25 OR 0.5 MG/DOSE,) 2 MG/1.5ML SOPN Inject 0.5 mg into the skin once a week. Inject 0.5mg  weekly     spironolactone (ALDACTONE) 25 MG tablet TAKE ONE-HALF TABLET DAILY 45 tablet 3   traMADol (ULTRAM) 50 MG tablet Take 1 tablet (50 mg total) by mouth daily as needed for moderate pain. 30 tablet 0   dicyclomine (BENTYL) 20 MG tablet Take 1 tablet (20 mg total) by mouth 2 (two) times daily as needed for spasms. 45 tablet 3   DULoxetine (CYMBALTA) 20 MG capsule Take 1 capsule (20 mg total)  by mouth daily. 30 capsule 3   insulin degludec (TRESIBA FLEXTOUCH) 200 UNIT/ML FlexTouch Pen Inject 100 Units into the skin daily. 15 mL 0   omeprazole (PRILOSEC) 40 MG capsule TAKE ONE CAPSULE BY MOUTH TWICE DAILY 180 capsule 2   benzonatate (TESSALON) 200 MG capsule Take 1 capsule (200 mg total) by mouth 3 (three) times daily as needed for cough. 21 capsule 0   LEVEMIR FLEXTOUCH 100 UNIT/ML FlexPen INJECT 60 units into THE SKIN EVERY MORNING AND 65 units EVERY IN THE EVENING 15 mL 1   Facility-Administered Medications Prior to Visit  Medication Dose Route Frequency Provider Last Rate Last Admin   0.9 %  sodium chloride infusion  500 mL Intravenous Once Armbruster, Carlota Raspberry, MD         Per HPI unless specifically indicated in ROS section below Review of Systems Objective:  BP 118/64   Pulse 82   Temp 97.6 F (36.4 C) (Temporal)   Ht 5\' 5"  (1.651 m)   Wt 183 lb (83 kg)   SpO2 95%   BMI 30.45 kg/m   Wt Readings from Last 3 Encounters:  10/26/20 183 lb (83 kg)  10/16/20 179 lb (81.2 kg)  09/11/20 178 lb 8 oz (81 kg)      Physical Exam Vitals and nursing note reviewed.  Constitutional:      Appearance: Normal appearance. She is not ill-appearing.  Eyes:     Extraocular Movements: Extraocular movements intact.     Conjunctiva/sclera: Conjunctivae normal.     Pupils: Pupils are equal, round, and reactive to light.  Cardiovascular:     Rate and Rhythm: Normal rate and regular rhythm.     Pulses: Normal pulses.     Heart sounds: Normal heart sounds. No murmur heard. Pulmonary:     Effort: Pulmonary effort is normal. No respiratory distress.     Breath sounds: Normal breath sounds. No wheezing, rhonchi or rales.  Musculoskeletal:     Right lower leg: No edema.     Left lower leg: No edema.  Skin:    General: Skin is warm and dry.     Findings: No rash.  Neurological:     Mental Status: She is alert.  Psychiatric:        Mood and Affect: Mood normal.        Behavior:  Behavior normal.      Results for orders placed or performed in visit on 09/11/20  POCT glycosylated hemoglobin (Hb A1C)  Result Value Ref Range   Hemoglobin A1C 8.1 (A) 4.0 - 5.6 %   HbA1c  POC (<> result, manual entry)     HbA1c, POC (prediabetic range)     HbA1c, POC (controlled diabetic range)     Assessment & Plan:  This visit occurred during the SARS-CoV-2 public health emergency.  Safety protocols were in place, including screening questions prior to the visit, additional usage of staff PPE, and extensive cleaning of exam room while observing appropriate contact time as indicated for disinfecting solutions.   Problem List Items Addressed This Visit     MDD (major depressive disorder), recurrent episode, moderate ([facility])    Stable period on cymbalta 20mg  daily - continue.        Relevant Medications   DULoxetine (CYMBALTA) 20 MG capsule   DM (diabetes mellitus), type 2, uncontrolled with complications ([facility]) - Primary    Chronic, uncontrolled, pt endorses deteriorated control attributed to increased sugar/sweets over the past month. She is ready to return to low sugar low carb diabetic diet. She also has upcoming nutritionist referral. Continue current regimen, I have refilled tresiba 3 month supply per pt request. 3 pens come per box.  She will return next week for nurse visit for CGM teaching. RTC 6 wks f/u visit DM        Relevant Medications   insulin degludec (TRESIBA FLEXTOUCH) 200 UNIT/ML FlexTouch Pen   Type 2 diabetes mellitus with proliferative diabetic retinopathy with macular edema, bilateral ([place])    Had eye injection last month.        Relevant Medications   insulin degludec (TRESIBA FLEXTOUCH) 200 UNIT/ML FlexTouch Pen   Cirrhosis of liver ([facility])    Planning to schedule f/u with GI.        Other Visit Diagnoses     Need for vaccination against Streptococcus pneumoniae       Relevant Orders   Pneumococcal conjugate vaccine 20-valent (Completed)         Meds ordered this encounter  Medications   DISCONTD: dicyclomine (BENTYL) 20 MG tablet    Sig: Take 1 tablet (20 mg total) by mouth 2 (two) times daily as needed for spasms.    Dispense:  45 tablet    Refill:  3   dicyclomine (BENTYL) 20 MG tablet    Sig: Take 1 tablet (20 mg total) by mouth 2 (two) times daily as needed for spasms.    Dispense:  30 tablet    Refill:  3   omeprazole (PRILOSEC) 40 MG capsule    Sig: Take 1 capsule (40 mg total) by mouth 2 (two) times daily.    Dispense:  180 capsule    Refill:  3    This prescription was filled on 10/16/2020. Any refills authorized will be placed on file.   insulin degludec (TRESIBA FLEXTOUCH) 200 UNIT/ML FlexTouch Pen    Sig: Inject 100 Units into the skin daily.    Dispense:  18 mL    Refill:  11    To replace levemir   DULoxetine (CYMBALTA) 20 MG capsule    Sig: Take 1 capsule (20 mg total) by mouth daily.    Dispense:  90 capsule    Refill:  3   Orders Placed This Encounter  Procedures   Pneumococcal conjugate vaccine 20-valent     Patient Instructions  Prevnar 20 today.  Schedule nurse visit for freestyle [place] teaching.  Return in 2 months for follow up visit.  Bentyl refilled.  Continue tresiba, work on low sugar low carb diet.   Follow up plan: Return in about 2 months (  around 12/26/2020) for follow up visit.  [name], MD

## 2020-10-26 NOTE — Assessment & Plan Note (Signed)
Planning to schedule f/u with GI.

## 2020-10-26 NOTE — Patient Instructions (Addendum)
Prevnar 20 today.  Schedule nurse visit for freestyle [place] teaching.  Return in 2 months for follow up visit.  Bentyl refilled.  Continue tresiba, work on low sugar low carb diet.

## 2020-10-28 ENCOUNTER — Other Ambulatory Visit (INDEPENDENT_AMBULATORY_CARE_PROVIDER_SITE_OTHER): Payer: HMO

## 2020-10-28 ENCOUNTER — Other Ambulatory Visit: Payer: Self-pay

## 2020-10-28 ENCOUNTER — Other Ambulatory Visit: Payer: HMO

## 2020-10-28 ENCOUNTER — Ambulatory Visit (INDEPENDENT_AMBULATORY_CARE_PROVIDER_SITE_OTHER): Payer: HMO

## 2020-10-28 DIAGNOSIS — I10 Essential (primary) hypertension: Secondary | ICD-10-CM

## 2020-10-28 DIAGNOSIS — I2510 Atherosclerotic heart disease of native coronary artery without angina pectoris: Secondary | ICD-10-CM

## 2020-10-28 DIAGNOSIS — E785 Hyperlipidemia, unspecified: Secondary | ICD-10-CM | POA: Diagnosis not present

## 2020-10-28 DIAGNOSIS — I471 Supraventricular tachycardia: Secondary | ICD-10-CM

## 2020-10-28 DIAGNOSIS — E1169 Type 2 diabetes mellitus with other specified complication: Secondary | ICD-10-CM | POA: Diagnosis not present

## 2020-10-28 DIAGNOSIS — K7460 Unspecified cirrhosis of liver: Secondary | ICD-10-CM

## 2020-10-28 DIAGNOSIS — E113513 Type 2 diabetes mellitus with proliferative diabetic retinopathy with macular edema, bilateral: Secondary | ICD-10-CM | POA: Diagnosis not present

## 2020-10-28 NOTE — Progress Notes (Signed)
Pt in today for freestyle libre 2 CBG monitor education. Pt has tried to place a sensor in the past but was unsuccessful due to her bending the needle during application. Pt replicated application process she used and it was discovered she was using an angle that was not advantageous for application and insertion of needle. Educated pt on proper placement and application. Pt placed a new sensor and was educated on reader and Elenor Legato app was downloaded on her phone. Educated on setting high and low alarms on both reader and app. Gave pt Scientist, clinical (histocompatibility and immunogenetics) from [employer]. Pt reported no further questions. Advised if anything changed or she had any further questions or difficulties to contact the office or libre's help line. Pt verbalized understanding and was very appreciative.

## 2020-10-29 LAB — HEPATIC FUNCTION PANEL
ALT: 30 IU/L (ref 0–32)
AST: 40 IU/L (ref 0–40)
Albumin: 4.6 g/dL (ref 3.8–4.8)
Alkaline Phosphatase: 61 IU/L (ref 44–121)
Bilirubin Total: 0.5 mg/dL (ref 0.0–1.2)
Bilirubin, Direct: 0.15 mg/dL (ref 0.00–0.40)
Total Protein: 7.3 g/dL (ref 6.0–8.5)

## 2020-10-29 LAB — LIPID PANEL
Chol/HDL Ratio: 4.5 ratio — ABNORMAL HIGH (ref 0.0–4.4)
Cholesterol, Total: 152 mg/dL (ref 100–199)
HDL: 34 mg/dL — ABNORMAL LOW (ref >39)
LDL Chol Calc (NIH): 73 mg/dL (ref 0–99)
Triglycerides: 277 mg/dL — ABNORMAL HIGH (ref 0–149)
VLDL Cholesterol Cal: 45 mg/dL — ABNORMAL HIGH (ref 5–40)

## 2020-10-30 ENCOUNTER — Encounter: Payer: HMO | Admitting: Gastroenterology

## 2020-11-02 ENCOUNTER — Other Ambulatory Visit: Payer: Self-pay | Admitting: Family Medicine

## 2020-11-04 DIAGNOSIS — E113593 Type 2 diabetes mellitus with proliferative diabetic retinopathy without macular edema, bilateral: Secondary | ICD-10-CM | POA: Diagnosis not present

## 2020-11-04 DIAGNOSIS — H4313 Vitreous hemorrhage, bilateral: Secondary | ICD-10-CM | POA: Diagnosis not present

## 2020-11-04 DIAGNOSIS — H2513 Age-related nuclear cataract, bilateral: Secondary | ICD-10-CM | POA: Diagnosis not present

## 2020-11-04 DIAGNOSIS — H43822 Vitreomacular adhesion, left eye: Secondary | ICD-10-CM | POA: Diagnosis not present

## 2020-11-09 ENCOUNTER — Ambulatory Visit: Payer: HMO | Admitting: Dietician

## 2020-11-18 ENCOUNTER — Encounter: Payer: Self-pay | Admitting: Family Medicine

## 2020-11-18 DIAGNOSIS — R059 Cough, unspecified: Secondary | ICD-10-CM

## 2020-11-18 NOTE — Telephone Encounter (Signed)
Recommend she do COVID test at home.  If doesn't have access to a COVID test, plz offer curbside COVID swab in our office. I have ordered.

## 2020-11-18 NOTE — Addendum Note (Signed)
Addended by: [name] on: 11/18/2020 01:58 PM   Modules accepted: Orders

## 2020-11-18 NOTE — Telephone Encounter (Signed)
Lvm asking pt to call back.    Dr. Darnell Level, ok to add pt at 4:30 for MyChart visit?

## 2020-11-18 NOTE — Telephone Encounter (Signed)
Lvm asking pt to call back.  Need to relay Dr. Synthia Innocent message and schedule curbside COVID swab accordingly.    Dr. Darnell Level also sent message to pt via MyChart.

## 2020-11-19 NOTE — Telephone Encounter (Signed)
Lvm asking pt to call back.  Need to relay Dr. Synthia Innocent message and schedule curbside COVID swab accordingly.    Dr. Darnell Level also sent message to pt via MyChart.

## 2020-11-20 ENCOUNTER — Encounter: Payer: Self-pay | Admitting: Family Medicine

## 2020-11-20 ENCOUNTER — Telehealth: Payer: Self-pay | Admitting: Family Medicine

## 2020-11-20 ENCOUNTER — Telehealth (INDEPENDENT_AMBULATORY_CARE_PROVIDER_SITE_OTHER): Payer: HMO | Admitting: Family Medicine

## 2020-11-20 VITALS — Ht 65.0 in | Wt 183.0 lb

## 2020-11-20 DIAGNOSIS — Z9861 Coronary angioplasty status: Secondary | ICD-10-CM | POA: Diagnosis not present

## 2020-11-20 DIAGNOSIS — I2510 Atherosclerotic heart disease of native coronary artery without angina pectoris: Secondary | ICD-10-CM | POA: Diagnosis not present

## 2020-11-20 DIAGNOSIS — E1165 Type 2 diabetes mellitus with hyperglycemia: Secondary | ICD-10-CM | POA: Diagnosis not present

## 2020-11-20 DIAGNOSIS — U071 COVID-19: Secondary | ICD-10-CM

## 2020-11-20 DIAGNOSIS — E118 Type 2 diabetes mellitus with unspecified complications: Secondary | ICD-10-CM | POA: Diagnosis not present

## 2020-11-20 DIAGNOSIS — K7469 Other cirrhosis of liver: Secondary | ICD-10-CM | POA: Diagnosis not present

## 2020-11-20 DIAGNOSIS — IMO0002 Reserved for concepts with insufficient information to code with codable children: Secondary | ICD-10-CM

## 2020-11-20 DIAGNOSIS — I10 Essential (primary) hypertension: Secondary | ICD-10-CM

## 2020-11-20 HISTORY — DX: COVID-19: U07.1

## 2020-11-20 MED ORDER — MOLNUPIRAVIR EUA 200MG CAPSULE: 4.0000 | ORAL_CAPSULE | Freq: Two times a day (BID) | ORAL | 0 refills | Status: AC

## 2020-11-20 NOTE — Assessment & Plan Note (Signed)
Recommend:  Molnupiravir given drug interactions to Paxlovid  Reviewed currently approved EUA treatments.  Reviewed expected course of illness, anticipated course of recovery, as well as red flags to suggested COVID pneumonia or to seek urgent in-person care.  Reviewed latest CDC isolation/quarantining guidelines.  Encouraged fluids and rest. Reviewed further supportive care measures at home including vit C, vit D, zinc, tylenol PRN.

## 2020-11-20 NOTE — Telephone Encounter (Signed)
Karen [name] called in due to she tested positive for covid on today and her symptoms of a sore throat started Monday, she is in quarantine now and wanted to know what to do.

## 2020-11-20 NOTE — Telephone Encounter (Signed)
Patient scheduled for today

## 2020-11-20 NOTE — Telephone Encounter (Signed)
Lvm asking pt to call back.  Need to relay Dr. Synthia Innocent message and schedule curbside COVID swab accordingly.    Dr. Darnell Level also sent message to pt via MyChart.

## 2020-11-20 NOTE — Telephone Encounter (Signed)
Pt returned call.  (See TE, 11/20/20)

## 2020-11-20 NOTE — Telephone Encounter (Signed)
Lvm asking pt to call back.  Need to schedule schedule MyChart visit today at 4:00.

## 2020-11-20 NOTE — Progress Notes (Signed)
Patient ID: Karen [name], female    DOB: March 30, 1956, 65 y.o.   MRN: [id number]  Virtual visit completed through [place], a video enabled telemedicine application. Due to national recommendations of social distancing due to COVID-19, a virtual visit is felt to be most appropriate for this patient at this time. Reviewed limitations, risks, security and privacy concerns of performing a virtual visit and the availability of in person appointments. I also reviewed that there may be a patient responsible charge related to this service. The patient agreed to proceed.   Patient location: at a friend's house - all 3 tested positive for COVID Provider location: [place] at [facility], office Persons participating in this virtual visit: patient, provider   If any vitals were documented, they were collected by patient at home unless specified below.    Ht 5\' 5"  (1.651 m)   Wt 183 lb (83 kg)   BMI 30.45 kg/m    CC: COVID Subjective:   HPI: Karen [name] is a 65 y.o. female presenting on 11/20/2020 for Cough (C/o cough.  Started 11/16/20 along with sneezing, ST and body aches.  Home COVID test resulted positive.   States other sxs have pretty much resolved, except cough.  Had [employer] booster, 04/15/20.  )   First day of symptoms: 11/16/20 Tested COVID positive: 11/20/20  Current symptoms: sneezing, productive cough, body aches, ST.  No: fevers/chills, abd pain, nausea, diarrhea, loss of taste/smell, dyspnea Treatments to date: corcedin HBP Risk factors include: age, diabetes, hypertension, CAD, cirrhosis   COVID vaccination status: [place] 07/2199, 08/2019, booster 04/2020      Relevant past medical, surgical, family and social history reviewed and updated as indicated. Interim medical history since our last visit reviewed. Allergies and medications reviewed and updated. Outpatient Medications Prior to Visit  Medication Sig Dispense Refill   acetaminophen (TYLENOL) 500 MG tablet Take 1,000 mg by  mouth 3 (three) times daily as needed for mild pain.     aspirin EC 81 MG EC tablet Take 1 tablet (81 mg total) by mouth daily.     Continuous Blood Gluc Receiver (FREESTYLE LIBRE 2 READER) DEVI Use as directed to check sugars 3-4 times daily 1 each 0   Continuous Blood Gluc Sensor (FREESTYLE LIBRE 2 SENSOR) MISC Use as directed to check sugars 3-4 times daily 6 each 3   dicyclomine (BENTYL) 20 MG tablet Take 1 tablet (20 mg total) by mouth 2 (two) times daily as needed for spasms. 30 tablet 3   DULoxetine (CYMBALTA) 20 MG capsule Take 1 capsule (20 mg total) by mouth daily. 90 capsule 3   furosemide (LASIX) 20 MG tablet Take 1 tablet (20 mg total) by mouth daily as needed for edema. 30 tablet 11   gabapentin (NEURONTIN) 300 MG capsule TAKE ONE CAPSULE BY MOUTH AT BEDTIME 90 capsule 1   insulin degludec (TRESIBA FLEXTOUCH) 200 UNIT/ML FlexTouch Pen Inject 100 Units into the skin daily. 18 mL 11   Insulin Pen Needle (PEN NEEDLES) 31G X 8 MM MISC Use to inject Lantus as directed twice daily. Dx: E11.40 60 each 3   insulin regular (NOVOLIN R) 100 units/mL injection Inject 0.25 mLs (25 Units total) into the skin 3 (three) times daily before meals. + sliding scale insulin 5u for every 50 over 200 10 mL    KLOR-CON SPRINKLE 10 MEQ CR capsule Take 1 capsule (10 mEq total) by mouth daily as needed. When taking Lasix 30 capsule 11   losartan (  COZAAR) 25 MG tablet Take 1 tablet (25 mg total) by mouth daily. 90 tablet 3   metFORMIN (GLUCOPHAGE-XR) 750 MG 24 hr tablet Take 2 tablets (1,500 mg total) by mouth every evening. 180 tablet 2   nitroGLYCERIN (NITROSTAT) 0.4 MG SL tablet Place 1 tablet (0.4 mg total) under the tongue every 5 (five) minutes as needed for chest pain. 25 tablet 0   omeprazole (PRILOSEC) 40 MG capsule Take 1 capsule (40 mg total) by mouth 2 (two) times daily. 180 capsule 3   polyethylene glycol (MIRALAX / GLYCOLAX) 17 g packet Take 17 g by mouth daily as needed.     propranolol (INDERAL)  20 MG tablet Take 1 tablet (20 mg total) by mouth 2 (two) times daily. 180 tablet 3   rosuvastatin (CRESTOR) 10 MG tablet Take 1 tablet (10 mg total) by mouth daily. 90 tablet 3   Semaglutide,0.25 or 0.5MG /DOS, (OZEMPIC, 0.25 OR 0.5 MG/DOSE,) 2 MG/1.5ML SOPN Inject 0.5 mg into the skin once a week. Inject 0.5mg  weekly     spironolactone (ALDACTONE) 25 MG tablet TAKE ONE-HALF TABLET DAILY 45 tablet 3   traMADol (ULTRAM) 50 MG tablet Take 1 tablet (50 mg total) by mouth daily as needed for moderate pain. 30 tablet 0   Facility-Administered Medications Prior to Visit  Medication Dose Route Frequency Provider Last Rate Last Admin   0.9 %  sodium chloride infusion  500 mL Intravenous Once Armbruster, Carlota Raspberry, MD         Per HPI unless specifically indicated in ROS section below Review of Systems Objective:  Ht 5\' 5"  (1.651 m)   Wt 183 lb (83 kg)   BMI 30.45 kg/m   Wt Readings from Last 3 Encounters:  11/20/20 183 lb (83 kg)  10/26/20 183 lb (83 kg)  10/16/20 179 lb (81.2 kg)       Physical exam: Gen: alert, NAD, not ill appearing Pulm: speaks in complete sentences without increased work of breathing Psych: normal mood, normal thought content      Assessment & Plan:   Problem List Items Addressed This Visit     DM (diabetes mellitus), type 2, uncontrolled with complications ([place])   HTN (hypertension)   CAD S/P percutaneous coronary angioplasty   Cirrhosis of liver ([place])   COVID-19 virus infection - Primary    Recommend:  Molnupiravir given drug interactions to Paxlovid  Reviewed currently approved EUA treatments.  Reviewed expected course of illness, anticipated course of recovery, as well as red flags to suggested COVID pneumonia or to seek urgent in-person care.  Reviewed latest CDC isolation/quarantining guidelines.  Encouraged fluids and rest. Reviewed further supportive care measures at home including vit C, vit D, zinc, tylenol PRN.         Relevant Medications    molnupiravir EUA 200 mg CAPS     Meds ordered this encounter  Medications   molnupiravir EUA 200 mg CAPS    Sig: Take 4 capsules (800 mg total) by mouth 2 (two) times daily for 5 days.    Dispense:  40 capsule    Refill:  0   No orders of the defined types were placed in this encounter.   I discussed the assessment and treatment plan with the patient. The patient was provided an opportunity to ask questions and all were answered. The patient agreed with the plan and demonstrated an understanding of the instructions. The patient was advised to call back or seek an in-person evaluation if the symptoms worsen  or if the condition fails to improve as anticipated.  Follow up plan: Return if symptoms worsen or fail to improve.  [name], MD

## 2020-11-23 ENCOUNTER — Telehealth: Payer: Self-pay | Admitting: Family Medicine

## 2020-11-23 NOTE — Telephone Encounter (Signed)
Don't recommend retesting.  Just let us know if not recovering as anticipated.

## 2020-11-23 NOTE — Telephone Encounter (Signed)
Patient call in requesting to know when should she be retested for Covid would like a call back.Please advise

## 2020-11-24 NOTE — Telephone Encounter (Signed)
Tried calling patient, she did not answer. LVM for her to call back.

## 2020-11-26 NOTE — Telephone Encounter (Signed)
Pt called back she did retest and was positive, I did advise her that you can still test positive for covid after having it but as long as sxs have resolved then she is okay. Pt did state sxs have resolved and understands you can still test positive but she is feeling better. Pt verbalized understanding of this information

## 2020-12-02 ENCOUNTER — Other Ambulatory Visit: Payer: Self-pay | Admitting: Family Medicine

## 2020-12-08 ENCOUNTER — Encounter: Payer: Self-pay | Admitting: Gastroenterology

## 2020-12-08 ENCOUNTER — Ambulatory Visit (INDEPENDENT_AMBULATORY_CARE_PROVIDER_SITE_OTHER): Payer: HMO | Admitting: Gastroenterology

## 2020-12-08 ENCOUNTER — Other Ambulatory Visit (INDEPENDENT_AMBULATORY_CARE_PROVIDER_SITE_OTHER): Payer: HMO

## 2020-12-08 VITALS — BP 92/50 | HR 68 | Ht 60.5 in | Wt 178.1 lb

## 2020-12-08 DIAGNOSIS — Z86010 Personal history of colonic polyps: Secondary | ICD-10-CM

## 2020-12-08 DIAGNOSIS — K7460 Unspecified cirrhosis of liver: Secondary | ICD-10-CM

## 2020-12-08 DIAGNOSIS — I8500 Esophageal varices without bleeding: Secondary | ICD-10-CM | POA: Diagnosis not present

## 2020-12-08 LAB — CBC WITH DIFFERENTIAL/PLATELET
Basophils Absolute: 0 10*3/uL (ref 0.0–0.1)
Basophils Relative: 0.5 % (ref 0.0–3.0)
Eosinophils Absolute: 0.1 10*3/uL (ref 0.0–0.7)
Eosinophils Relative: 2.4 % (ref 0.0–5.0)
HCT: 36.7 % (ref 36.0–46.0)
Hemoglobin: 12.1 g/dL (ref 12.0–15.0)
Lymphocytes Relative: 37.5 % (ref 12.0–46.0)
Lymphs Abs: 1.6 10*3/uL (ref 0.7–4.0)
MCHC: 32.9 g/dL (ref 30.0–36.0)
MCV: 86.6 fl (ref 78.0–100.0)
Monocytes Absolute: 0.3 10*3/uL (ref 0.1–1.0)
Monocytes Relative: 6.5 % (ref 3.0–12.0)
Neutro Abs: 2.2 10*3/uL (ref 1.4–7.7)
Neutrophils Relative %: 53.1 % (ref 43.0–77.0)
Platelets: 110 10*3/uL — ABNORMAL LOW (ref 150.0–400.0)
RBC: 4.24 Mil/uL (ref 3.87–5.11)
RDW: 15.8 % — ABNORMAL HIGH (ref 11.5–15.5)
WBC: 4.2 10*3/uL (ref 4.0–10.5)

## 2020-12-08 LAB — PROTIME-INR
INR: 1.1 ratio — ABNORMAL HIGH (ref 0.8–1.0)
Prothrombin Time: 11.8 s (ref 9.6–13.1)

## 2020-12-08 MED ORDER — SUTAB 1479-225-188 MG PO TABS: 1.0000 | ORAL_TABLET | Freq: Once | ORAL | 0 refills | Status: AC

## 2020-12-08 NOTE — Progress Notes (Signed)
HPI :  65 year old female here for a follow-up visit for cirrhosis and history of colon polyps.  Recall that she was seen in October 2020 with an abnormal CT scan suggesting cirrhosis.  She was referred for an endoscopy and colonoscopy at that time.  We had recommended a serologic work-up for her liver disease however she was unable to proceed with that because she was self-pay at the time and could not afford it.  The patient in June 2020 had gone to the emergency department complaining of abdominal pain had a CT scan done there showing cirrhosis with recanalization of the umbilical vein as well as rectal thickening.  She had an endoscopy and colonoscopy done with [place] as outlined below in 2020:    EGD 04/25/19  - The Z-line was regular. - Grade II varices (small to medium sized which did not flatten with insufflation) were found in the lower third of the esophagus. No high risk stigmata for bleeding noted. - The exam of the esophagus was otherwise normal. - Patchy inflammation characterized by adherent blood and friability was found in the proximal gastric body. When using water jet to clear the mucosa, it was quite friable with oozing which stopped after observation. No focal areas noted to treat. - A single 6 mm sessile polyp was found in the gastric antrum, adenomatous appearing. The polyp was removed with a hot snare. Resection and retrieval were complete. To prevent bleeding after the polypectomy given friability of the mucosa in the stomach elsewhere, one hemostatic clip was successfully placed across the lesion. - The exam of the stomach was otherwise normal. - Biopsies were taken with a cold forceps in the gastric body, at the incisura and in the gastric antrum for Helicobacter pylori testing. - The duodenal bulb and second portion of the duodenum were normal.  Colonoscopy 04/25/19  - Preparation of the colon was fair, significant time spent lavaging the colon to achieve  mostly adequate views. Right colon was most affected.. - Suspected congested mucosa in the cecum. Biopsied to rule out flat adenoma. - One 6 mm polyp at the hepatic flexure, removed with a cold snare. Resected and retrieved. - Nine 3 to 7 mm polyps in the transverse colon, removed with a cold snare. Resected and retrieved. - One 6 mm polyp in the descending colon, removed with a cold snare. Resected and retrieved. - One 8 mm polyp at the recto-sigmoid colon, removed with a hot snare. Resected and retrieved. - Significant colonic spasm. - The examination was otherwise normal. No abnormality in the rectum to correlate with CT findings.  Diagnosis 1. Surgical [P], gastric polyp - HYPERPLASTIC GASTRIC POLYP(S) WITH INFLAMMATION - NO H. PYLORI, INTESTINAL METAPLASIA OR MALIGNANCY IDENTIFIED 2. Surgical [P], gastric - MILD CHRONIC GASTRITIS WITH REACTIVE CHANGES - NO H. PYLORI OR INTESTINAL METAPLASIA IDENTIFIED - SEE COMMENT 3. Surgical [P], colon, transverse, hepatic flexure, descending, rectosigmoid, polyp (12) - MULTIPLE FRAGMENTS OF TUBULAR ADENOMA(S) - NO HIGH GRADE DYSPLASIA OR MALIGNANCY IDENTIFIED 4. Surgical [P], colon, cecum - POLYPOID FRAGMENTS OF COLONIC MUCOSA WITH LYMPHOID AGGREGATE(S) (1 OF 2 FRAGMENTS) - BENIGN COLONIC MUCOSA (1 OF 2 FRAGMENTS) - NO HIGH GRADE DYSPLASIA OR MALIGNANCY IDENTIFIED  She was placed on propranolol following her endoscopy and a repeat colonoscopy was recommended within 1 year given her polyp burden.  I have not seen her since that time.   She states she is never really drank much of any alcohol, denies any heavy alcohol use.  Her grandmother  had cirrhosis, her father had pancreatic cancer, no HCC or cirrhosis in first-degree relatives.  She has no family history of colon cancer.  She appears to have compensated cirrhosis.  She has not had any jaundice, no ascites or edema, no encephalopathy.  She does have history of varices but they have never  bled.  She denies any blood in her stools.  She has regular bowel habits typically although has some mild constipation.  She had a hard time with a bowel prep during her first colonoscopy and the prep was fair.  She had LFTs done in June which were fairly normal.  Bilirubin normal.  She has some baseline thrombocytopenia with platelets in the low 100s and an INR of 1.1, checked 6 months ago.  He has not had any follow-up imaging of her liver.  She otherwise denies complaints and states she feels pretty well.  She is tolerating propranolol well.     EGD 06/24/20 - EF 70-75%, grade IDD   CT abdomen / pelvis - 11/02/18 - IMPRESSION: 1. Nodular contour of the liver, consistent with cirrhosis. 2. Recanalization of the umbilical vein, which may be seen with portal hypertension. 3. Circumferential thickening of the rectum, significance uncertain. Correlation with colonoscopy on a nonemergent basis may be considered. 4. Normal appearance of the lumbosacral spine.    On propranolol      Past Medical History:  Diagnosis Date   Body aches 03/24/2016   Resolved off simvastatin.    CAD (coronary artery disease)    STEMI 07/2013 - treated with PTCA of the RCA followed by DES to the intermediate ramus  //  Myoview 5/17 - Normal perfusion. LVEF 71% with normal wall motion. This is a low risk study.   Chronic bronchitis    Cirrhosis (HCC)    Depression    Diabetes ([place])    GERD (gastroesophageal reflux disease)    High blood pressure    History of chicken pox    History of ovarian cyst    HLD (hyperlipidemia)    Seasonal allergies    Seizure ([place]) 1977   off AEDs   SVT (supraventricular tachycardia) ([place]) 10/2015   s/p catheter ablation   Syncopal episodes    Tachyarrhythmia    ER visits with adenosine push to control   Tubal pregnancy    Type 2 diabetes, uncontrolled, with neuropathy ([place])    dx 1993     Past Surgical History:  Procedure Laterality Date   ABLATION OF DYSRHYTHMIC  FOCUS  11/06/2015   SVT   CARDIAC CATHETERIZATION  07/2013   angioplasty to RCA and DES to ramus intermedius   COLONOSCOPY WITH ESOPHAGOGASTRODUODENOSCOPY (EGD)  04/2019   esophageal varices II, portal hypertensive gastritis, gastric polyp, H pylori neg, 12 TAs, rpt colonoscopy in 1 yr ([facility])   [place] PREGNANCY SURGERY Right 1984   tube removal 2/2 hemorrhage   ELECTROPHYSIOLOGIC STUDY N/A 11/06/2015   SVT Ablation;  Evans Lance, MD;  Location: [facility] INVASIVE CV LAB   LEFT HEART CATHETERIZATION WITH CORONARY ANGIOGRAM N/A 07/23/2013   Procedure: LEFT HEART CATHETERIZATION WITH CORONARY ANGIOGRAM;  Surgeon: Sinclair Grooms, MD;  Location: [facility] CATH LAB;  Service: Cardiovascular;  Laterality: N/A;   OVARIAN CYST SURGERY  1998   PERCUTANEOUS CORONARY STENT INTERVENTION (PCI-S) N/A 07/24/2013   Procedure: PERCUTANEOUS CORONARY STENT INTERVENTION (PCI-S);  Surgeon: Sinclair Grooms, MD;  Location: [facility] CATH LAB;  Service: Cardiovascular;  Laterality: N/A;   ROTATOR CUFF REPAIR  2006   Right   SHOULDER ARTHROSCOPY Left 02/2017   LEFT shoulder arthroscopy with limited debridement, decompression/acromioplasty, open rotator cuff repair and biceps tenodesis ([facility])   SUBMANDIBULAR DUCT LIGATION Right 10/2014   R submandibular gland stone removed ([place])   TONSILLECTOMY AND ADENOIDECTOMY  1965   Family History  Problem Relation Age of Onset   Diabetes Mother    Hyperlipidemia Mother    Hypertension Mother    Stroke Mother    Coronary artery disease Mother        CABG   Heart disease Mother    Diabetes Father    Hypertension Father    Hyperlipidemia Father    Coronary artery disease Father        CABG   Congestive Heart Failure Father    Pancreatic cancer Father    Arrhythmia Brother    Diabetes Brother    Diabetes Maternal Grandmother    Cirrhosis Maternal Grandmother    Diabetes Maternal Grandfather    Diabetes Paternal Grandmother     Diabetes Paternal Grandfather    Heart disease Brother    Diabetes Brother    Diabetes Sister    Breast cancer Paternal Aunt    Kidney disease Neg Hx    Social History   Tobacco Use   Smoking status: Never   Smokeless tobacco: Never  Vaping Use   Vaping Use: Never used  Substance Use Topics   Alcohol use: No   Drug use: No   Current Outpatient Medications  Medication Sig Dispense Refill   acetaminophen (TYLENOL) 500 MG tablet Take 1,000 mg by mouth 3 (three) times daily as needed for mild pain.     aspirin EC 81 MG EC tablet Take 1 tablet (81 mg total) by mouth daily.     Continuous Blood Gluc Receiver (FREESTYLE LIBRE 2 READER) DEVI Use as directed to check sugars 3-4 times daily 1 each 0   Continuous Blood Gluc Sensor (FREESTYLE LIBRE 2 SENSOR) MISC Use as directed to check sugars 3-4 times daily 6 each 3   dicyclomine (BENTYL) 20 MG tablet Take 1 tablet (20 mg total) by mouth 2 (two) times daily as needed for spasms. (Patient taking differently: Take 20 mg by mouth as needed for spasms.) 30 tablet 3   DULoxetine (CYMBALTA) 20 MG capsule Take 1 capsule (20 mg total) by mouth daily. 90 capsule 3   furosemide (LASIX) 20 MG tablet Take 1 tablet (20 mg total) by mouth daily as needed for edema. 30 tablet 11   gabapentin (NEURONTIN) 300 MG capsule TAKE ONE CAPSULE BY MOUTH AT BEDTIME 90 capsule 1   insulin degludec (TRESIBA FLEXTOUCH) 200 UNIT/ML FlexTouch Pen Inject 100 Units into the skin daily. 18 mL 11   Insulin Pen Needle (PEN NEEDLES) 31G X 8 MM MISC Use to inject Lantus as directed twice daily. Dx: E11.40 60 each 3   insulin regular (NOVOLIN R) 100 units/mL injection Inject 0.25 mLs (25 Units total) into the skin 3 (three) times daily before meals. + sliding scale insulin 5u for every 50 over 200 10 mL    KLOR-CON SPRINKLE 10 MEQ CR capsule Take 1 capsule (10 mEq total) by mouth daily as needed. When taking Lasix 30 capsule 11   losartan (COZAAR) 25 MG tablet Take 1 tablet (25 mg  total) by mouth daily. 90 tablet 3   metFORMIN (GLUCOPHAGE-XR) 750 MG 24 hr tablet Take 2 tablets (1,500 mg total) by mouth  every evening. 180 tablet 2   omeprazole (PRILOSEC) 40 MG capsule Take 1 capsule (40 mg total) by mouth 2 (two) times daily. 180 capsule 3   polyethylene glycol (MIRALAX / GLYCOLAX) 17 g packet Take 17 g by mouth as needed.     propranolol (INDERAL) 20 MG tablet Take 1 tablet (20 mg total) by mouth 2 (two) times daily. 180 tablet 3   rosuvastatin (CRESTOR) 10 MG tablet Take 1 tablet (10 mg total) by mouth daily. 90 tablet 3   Semaglutide,0.25 or 0.'5MG'$ /DOS, (OZEMPIC, 0.25 OR 0.5 MG/DOSE,) 2 MG/1.5ML SOPN Inject 0.5 mg into the skin once a week. Inject 0.'5mg'$  weekly     spironolactone (ALDACTONE) 25 MG tablet TAKE ONE-HALF TABLET DAILY 45 tablet 3   traMADol (ULTRAM) 50 MG tablet Take 1 tablet (50 mg total) by mouth daily as needed for moderate pain. 30 tablet 0   nitroGLYCERIN (NITROSTAT) 0.4 MG SL tablet Place 1 tablet (0.4 mg total) under the tongue every 5 (five) minutes as needed for chest pain. (Patient not taking: Reported on 12/08/2020) 25 tablet 0   Current Facility-Administered Medications  Medication Dose Route Frequency Provider Last Rate Last Admin   0.9 %  sodium chloride infusion  500 mL Intravenous Once Alexandr Oehler, Carlota Raspberry, MD       Allergies  Allergen Reactions   Clindamycin/Lincomycin Nausea And Vomiting and Other (See Comments)    Pt refuses to retry   Erythromycin Other (See Comments)    Stomach cramps   Lipitor [Atorvastatin] Nausea Only   Penicillins Rash and Other (See Comments)    Has patient had a PCN reaction causing immediate rash, facial/tongue/throat swelling, SOB or lightheadedness with hypotension: Yes Has patient had a PCN reaction causing severe rash involving mucus membranes or skin necrosis: No Has patient had a PCN reaction that required hospitalization No Has patient had a PCN reaction occurring within the last 10 years: No If all of  the above answers are "NO", then may proceed with Cephalosporin use.       Review of Systems: All systems reviewed and negative except where noted in HPI.    Lab Results  Component Value Date   WBC 5.0 06/09/2020   HGB 13.0 06/09/2020   HCT 39.0 06/09/2020   MCV 86.7 06/09/2020   PLT 132.0 (L) 06/09/2020    Lab Results  Component Value Date   INR 1.1 (H) 06/09/2020   INR 1.0 04/10/2019   INR 1.03 07/24/2013    Lab Results  Component Value Date   CREATININE 0.66 06/09/2020   BUN 16 06/09/2020   NA 138 06/09/2020   K 4.7 06/09/2020   CL 103 06/09/2020   CO2 28 06/09/2020    Lab Results  Component Value Date   ALT 30 10/28/2020   AST 40 10/28/2020   ALKPHOS 61 10/28/2020   BILITOT 0.5 10/28/2020     Physical Exam: BP (!) 92/50 (BP Location: Left Arm, Patient Position: Sitting, Cuff Size: Normal)   Pulse 68   Ht 5' 0.5" (1.537 m) Comment: height measured without shoes  Wt 178 lb 2 oz (80.8 kg)   BMI 34.22 kg/m  Constitutional: Pleasant,well-developed, female in no acute distress. Abdominal: Soft, nondistended, nontender. There are no masses palpable.  Extremities: no edema Neurological: Alert and oriented to person place and time. Skin: Skin is warm and dry. No rashes noted. Psychiatric: Normal mood and affect. Behavior is normal.   ASSESSMENT AND PLAN: 65 year old female here for reassessment of the following:  Cirrhosis Esophageal varices History of colon polyps  As above, patient with cirrhosis that appears compensated over time.  No significant alcohol use history, unclear etiology although suspect could be due to fatty liver, she has not had a prior serologic work-up and she is agreeable to do it at the time now that she has insurance.  We will send her for labs to evaluate for chronic liver disease, will assess for immunity to hepatitis a and B and immunize if needed.  She is due for [facility] screening we will order ultrasound along with AFP level.  I  discussed with her risks for decompensation long-term and risk for HCC.  She is to follow-up with [place] every 6 months.  She is tolerating propranolol well and will continue that for her varices, as long as she continues that we do not need to do another upper endoscopy.  She is otherwise overdue for surveillance colonoscopy.  I discussed risk benefits of anesthesia and colonoscopy and she wants to proceed.  We will use a double preparation for this exam given history of fair prep.  She agreed  Plan: - Labs to evaluate for chronic liver diseases - Check immunity to hep a and B and immunize if needed - Right upper quadrant ultrasound for HCC screening and AFP - Continue propranolol - Colonoscopy at the [facility], double prep - Follow-up in 6 months  Jolly Mango, MD [facility]

## 2020-12-08 NOTE — Patient Instructions (Addendum)
If you are age 63 or older, your body mass index should be between 23-30. Your Body mass index is 34.22 kg/m. If this is out of the aforementioned range listed, please consider follow up with your Primary Care Provider.  If you are age 78 or younger, your body mass index should be between 19-25. Your Body mass index is 34.22 kg/m. If this is out of the aformentioned range listed, please consider follow up with your Primary Care Provider.   __________________________________________________________  The [facility] providers would like to encourage you to use [facility] to communicate with providers for non-urgent requests or questions.  Due to long hold times on the telephone, sending your provider a message by [facility] may be a faster and more efficient way to get a response.  Please allow 48 business hours for a response.  Please remember that this is for non-urgent requests.   You have been scheduled for a colonoscopy. Please follow written instructions given to you at your visit today.  Please pick up your prep supplies at the pharmacy within the next 1-3 days. If you use inhalers (even only as needed), please bring them with you on the day of your procedure.  Please go to the lab in the basement of our building to have lab work done as you leave today. Hit "B" for basement when you get on the elevator.  When the doors open the lab is on your left.  We will call you with the results. Thank you.   You will be contacted by [facility] in the next 2 days to arrange a RUQ ultrasound.  The number on your caller ID will be [phone], please answer when they call.  If you have not heard from them in 2 days please call [phone] to schedule.     Continue propranolol.   You will be due for a follow up visit in 6 months.   Thank you for entrusting me with your care and for choosing [facility], Dr. Montvale Cellar

## 2020-12-09 LAB — IBC + FERRITIN
Ferritin: 72.4 ng/mL (ref 10.0–291.0)
Iron: 61 ug/dL (ref 42–145)
Saturation Ratios: 16.3 % — ABNORMAL LOW (ref 20.0–50.0)
Transferrin: 268 mg/dL (ref 212.0–360.0)

## 2020-12-10 ENCOUNTER — Telehealth: Payer: Self-pay

## 2020-12-10 ENCOUNTER — Emergency Department (HOSPITAL_COMMUNITY): Payer: HMO

## 2020-12-10 ENCOUNTER — Other Ambulatory Visit: Payer: Self-pay

## 2020-12-10 ENCOUNTER — Emergency Department (HOSPITAL_COMMUNITY)
Admission: EM | Admit: 2020-12-10 | Discharge: 2020-12-10 | Disposition: A | Discharge status: Home / Self Care | Payer: HMO | Attending: Emergency Medicine | Admitting: Emergency Medicine

## 2020-12-10 DIAGNOSIS — S39012A Strain of muscle, fascia and tendon of lower back, initial encounter: Secondary | ICD-10-CM | POA: Diagnosis not present

## 2020-12-10 DIAGNOSIS — E113512 Type 2 diabetes mellitus with proliferative diabetic retinopathy with macular edema, left eye: Secondary | ICD-10-CM | POA: Diagnosis not present

## 2020-12-10 DIAGNOSIS — Z794 Long term (current) use of insulin: Secondary | ICD-10-CM | POA: Diagnosis not present

## 2020-12-10 DIAGNOSIS — Z041 Encounter for examination and observation following transport accident: Secondary | ICD-10-CM | POA: Diagnosis not present

## 2020-12-10 DIAGNOSIS — Z7984 Long term (current) use of oral hypoglycemic drugs: Secondary | ICD-10-CM | POA: Insufficient documentation

## 2020-12-10 DIAGNOSIS — I2510 Atherosclerotic heart disease of native coronary artery without angina pectoris: Secondary | ICD-10-CM | POA: Insufficient documentation

## 2020-12-10 DIAGNOSIS — Z7982 Long term (current) use of aspirin: Secondary | ICD-10-CM | POA: Diagnosis not present

## 2020-12-10 DIAGNOSIS — Y92410 Unspecified street and highway as the place of occurrence of the external cause: Secondary | ICD-10-CM | POA: Insufficient documentation

## 2020-12-10 DIAGNOSIS — G319 Degenerative disease of nervous system, unspecified: Secondary | ICD-10-CM | POA: Diagnosis not present

## 2020-12-10 DIAGNOSIS — S199XXA Unspecified injury of neck, initial encounter: Secondary | ICD-10-CM | POA: Diagnosis not present

## 2020-12-10 DIAGNOSIS — Z79899 Other long term (current) drug therapy: Secondary | ICD-10-CM | POA: Insufficient documentation

## 2020-12-10 DIAGNOSIS — E1169 Type 2 diabetes mellitus with other specified complication: Secondary | ICD-10-CM | POA: Diagnosis not present

## 2020-12-10 DIAGNOSIS — M2578 Osteophyte, vertebrae: Secondary | ICD-10-CM | POA: Diagnosis not present

## 2020-12-10 DIAGNOSIS — S00431A Contusion of right ear, initial encounter: Secondary | ICD-10-CM | POA: Diagnosis not present

## 2020-12-10 DIAGNOSIS — S161XXA Strain of muscle, fascia and tendon at neck level, initial encounter: Secondary | ICD-10-CM | POA: Diagnosis not present

## 2020-12-10 DIAGNOSIS — E785 Hyperlipidemia, unspecified: Secondary | ICD-10-CM | POA: Insufficient documentation

## 2020-12-10 DIAGNOSIS — Z955 Presence of coronary angioplasty implant and graft: Secondary | ICD-10-CM | POA: Diagnosis not present

## 2020-12-10 DIAGNOSIS — M47812 Spondylosis without myelopathy or radiculopathy, cervical region: Secondary | ICD-10-CM | POA: Diagnosis not present

## 2020-12-10 DIAGNOSIS — S0990XA Unspecified injury of head, initial encounter: Secondary | ICD-10-CM | POA: Diagnosis not present

## 2020-12-10 NOTE — Progress Notes (Signed)
Order for Twinrix entered.
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
## 2020-12-10 NOTE — Discharge Instructions (Addendum)
Your x-rays all look normal today.  You do have a bruise of your right ear and you can ice it for no more than 20 minutes at a time.

## 2020-12-10 NOTE — Telephone Encounter (Signed)
See result note. Patient needs Twinrix.  Order is in. LM for patient to call back to be scheduled for nurse visit for 1st injection on a Monday, Thursday or Friday.
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
## 2020-12-12 LAB — MITOCHONDRIAL ANTIBODIES: Mitochondrial M2 Ab, IgG: 79.5 U — ABNORMAL HIGH

## 2020-12-12 LAB — ANA: Anti Nuclear Antibody (ANA): NEGATIVE

## 2020-12-12 LAB — HEPATITIS C ANTIBODY
Hepatitis C Ab: NONREACTIVE
SIGNAL TO CUT-OFF: 0.01 (ref <1.00)

## 2020-12-12 LAB — IGG: IgG (Immunoglobin G), Serum: 986 mg/dL (ref 600–1540)

## 2020-12-12 LAB — HEPATITIS B SURFACE ANTIGEN: Hepatitis B Surface Ag: NONREACTIVE

## 2020-12-12 LAB — ANTI-SMOOTH MUSCLE ANTIBODY, IGG: Actin (Smooth Muscle) Antibody (IGG): <20 U (ref <20)

## 2020-12-12 LAB — ALPHA-1-ANTITRYPSIN: A-1 Antitrypsin, Ser: 143 mg/dL (ref 83–199)

## 2020-12-12 LAB — AFP TUMOR MARKER: AFP-Tumor Marker: 1.7 ng/mL

## 2020-12-12 LAB — HEPATITIS A ANTIBODY, TOTAL: Hepatitis A AB,Total: NONREACTIVE

## 2020-12-12 LAB — HEPATITIS B SURFACE ANTIBODY,QUALITATIVE: Hep B S Ab: NONREACTIVE

## 2020-12-14 NOTE — Telephone Encounter (Signed)
Called and LM for patient to call back to get scheduled.

## 2020-12-15 NOTE — Telephone Encounter (Signed)
Patient called back and scheduled for 01/04/2021.

## 2020-12-18 ENCOUNTER — Ambulatory Visit (HOSPITAL_COMMUNITY): Payer: HMO

## 2020-12-18 ENCOUNTER — Other Ambulatory Visit: Payer: Self-pay

## 2020-12-18 DIAGNOSIS — K7460 Unspecified cirrhosis of liver: Secondary | ICD-10-CM

## 2020-12-18 DIAGNOSIS — I8500 Esophageal varices without bleeding: Secondary | ICD-10-CM

## 2020-12-18 NOTE — Progress Notes (Signed)
See 12/08/20 result notes for orders.

## 2020-12-25 ENCOUNTER — Ambulatory Visit (HOSPITAL_COMMUNITY): Admission: RE | Admit: 2020-12-25 | Payer: HMO | Source: Ambulatory Visit

## 2020-12-28 ENCOUNTER — Other Ambulatory Visit: Payer: Self-pay | Admitting: Family Medicine

## 2020-12-28 ENCOUNTER — Encounter: Payer: Self-pay | Admitting: Family Medicine

## 2020-12-28 ENCOUNTER — Other Ambulatory Visit: Payer: Self-pay

## 2020-12-28 ENCOUNTER — Ambulatory Visit (INDEPENDENT_AMBULATORY_CARE_PROVIDER_SITE_OTHER): Payer: HMO | Admitting: Family Medicine

## 2020-12-28 ENCOUNTER — Encounter: Payer: HMO | Admitting: Dietician

## 2020-12-28 VITALS — BP 116/66 | HR 71 | Temp 97.4°F | Ht 60.0 in | Wt 179.0 lb

## 2020-12-28 DIAGNOSIS — S91301D Unspecified open wound, right foot, subsequent encounter: Secondary | ICD-10-CM | POA: Insufficient documentation

## 2020-12-28 DIAGNOSIS — K7469 Other cirrhosis of liver: Secondary | ICD-10-CM | POA: Diagnosis not present

## 2020-12-28 DIAGNOSIS — I998 Other disorder of circulatory system: Secondary | ICD-10-CM | POA: Diagnosis not present

## 2020-12-28 DIAGNOSIS — R9082 White matter disease, unspecified: Secondary | ICD-10-CM | POA: Diagnosis not present

## 2020-12-28 DIAGNOSIS — E118 Type 2 diabetes mellitus with unspecified complications: Secondary | ICD-10-CM | POA: Diagnosis not present

## 2020-12-28 DIAGNOSIS — Z794 Long term (current) use of insulin: Secondary | ICD-10-CM | POA: Diagnosis not present

## 2020-12-28 DIAGNOSIS — L03119 Cellulitis of unspecified part of limb: Secondary | ICD-10-CM | POA: Diagnosis not present

## 2020-12-28 DIAGNOSIS — E1165 Type 2 diabetes mellitus with hyperglycemia: Secondary | ICD-10-CM | POA: Diagnosis not present

## 2020-12-28 DIAGNOSIS — E113513 Type 2 diabetes mellitus with proliferative diabetic retinopathy with macular edema, bilateral: Secondary | ICD-10-CM

## 2020-12-28 DIAGNOSIS — IMO0002 Reserved for concepts with insufficient information to code with codable children: Secondary | ICD-10-CM

## 2020-12-28 LAB — POCT GLYCOSYLATED HEMOGLOBIN (HGB A1C): Hemoglobin A1C: 8.1 % — AB (ref 4.0–5.6)

## 2020-12-28 MED ORDER — DOXYCYCLINE HYCLATE 100 MG PO TABS: 100.0000 mg | ORAL_TABLET | Freq: Two times a day (BID) | ORAL | 0 refills | Status: DC

## 2020-12-28 MED ORDER — MUPIROCIN 2 % EX OINT: 1.0000 "application " | TOPICAL_OINTMENT | Freq: Two times a day (BID) | CUTANEOUS | 0 refills | Status: AC

## 2020-12-28 MED ORDER — SEMAGLUTIDE (1 MG/DOSE) 4 MG/3ML ~~LOC~~ SOPN: 1.0000 mg | PEN_INJECTOR | SUBCUTANEOUS | 11 refills | Status: DC

## 2020-12-28 NOTE — Assessment & Plan Note (Addendum)
Chronic, stable, above goal. Will increase ozempic to '1mg'$  weekly. Discussed stopping eating when she feels full to try and avoid GI upset/nausea side effect of GLP1RA.  Discussed slow taper of insulin (both tresiba and novolin R) as we increase ozempic dose.  RTC 3 mo DM f/u visit.

## 2020-12-28 NOTE — Assessment & Plan Note (Addendum)
Appreciate GI care - seeing Dr Havery Moros pending updated liver imaging and biopsy.

## 2020-12-28 NOTE — Assessment & Plan Note (Signed)
R foot abrasion with surrounding erythema - will Rx doxycycline course to cover infection. Update if not improving with treatment.

## 2020-12-28 NOTE — Assessment & Plan Note (Signed)
Reassuring ER evaluation.

## 2020-12-28 NOTE — Patient Instructions (Addendum)
Trial higher ozempic dose '1mg'$  weekly - I've sent this in to your pharmacy. Monitor for low sugars and if they develop, start lowering dose of insulin (tresiba by 10 units and novolin R by 5 units each time).  For foot wound - concern may have some infection. Start doxycycline '100mg'$  twice daily. May use mupirocin sent to pharmacy.  Return in 3 months for diabetes follow up visit.

## 2020-12-28 NOTE — Assessment & Plan Note (Signed)
Now regularly sees retinologist and eye doctor. Upcoming lens surgery next week.
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
## 2020-12-29 DIAGNOSIS — H2511 Age-related nuclear cataract, right eye: Secondary | ICD-10-CM | POA: Diagnosis not present

## 2020-12-29 DIAGNOSIS — H18413 Arcus senilis, bilateral: Secondary | ICD-10-CM | POA: Diagnosis not present

## 2020-12-29 DIAGNOSIS — H25043 Posterior subcapsular polar age-related cataract, bilateral: Secondary | ICD-10-CM | POA: Diagnosis not present

## 2020-12-29 DIAGNOSIS — H4311 Vitreous hemorrhage, right eye: Secondary | ICD-10-CM | POA: Diagnosis not present

## 2020-12-29 DIAGNOSIS — H2513 Age-related nuclear cataract, bilateral: Secondary | ICD-10-CM | POA: Diagnosis not present

## 2020-12-29 DIAGNOSIS — E113593 Type 2 diabetes mellitus with proliferative diabetic retinopathy without macular edema, bilateral: Secondary | ICD-10-CM | POA: Diagnosis not present

## 2020-12-30 ENCOUNTER — Other Ambulatory Visit: Payer: Self-pay | Admitting: Radiology

## 2020-12-30 ENCOUNTER — Ambulatory Visit (INDEPENDENT_AMBULATORY_CARE_PROVIDER_SITE_OTHER): Payer: HMO | Admitting: Gastroenterology

## 2020-12-30 ENCOUNTER — Telehealth: Payer: Self-pay | Admitting: Family Medicine

## 2020-12-30 ENCOUNTER — Other Ambulatory Visit: Payer: Self-pay

## 2020-12-30 DIAGNOSIS — Z23 Encounter for immunization: Secondary | ICD-10-CM

## 2020-12-30 NOTE — Chronic Care Management (AMB) (Signed)
  Chronic Care Management   Outreach Note  12/30/2020 Name: Karen [name] MRN: [id number] DOB: [birth year]  Referred by: [name], MD Reason for referral : No chief complaint on file.   An unsuccessful telephone outreach was attempted today. The patient was referred to the pharmacist for assistance with care management and care coordination.   Follow Up Plan:   Tatjana Dellinger Upstream Scheduler

## 2020-12-31 ENCOUNTER — Telehealth: Payer: Self-pay | Admitting: Gastroenterology

## 2020-12-31 ENCOUNTER — Other Ambulatory Visit: Payer: Self-pay | Admitting: Radiology

## 2020-12-31 NOTE — Telephone Encounter (Signed)
Pt needs to r/s liver biopsy that is scheduled for tomorrow at [place]. She wants an appt in September.

## 2020-12-31 NOTE — Telephone Encounter (Signed)
Attempted to reach patient twice, her vm is full. Unable to leave a vm at this time. Patient can call radiology scheduling at [phone] to reschedule her liver biopsy.

## 2021-01-01 ENCOUNTER — Encounter (HOSPITAL_COMMUNITY): Payer: Self-pay

## 2021-01-01 ENCOUNTER — Ambulatory Visit (HOSPITAL_COMMUNITY): Admission: RE | Admit: 2021-01-01 | Payer: HMO | Source: Ambulatory Visit

## 2021-01-01 NOTE — Telephone Encounter (Signed)
Attempted to reach patient, her vm is full. Unable to leave a message at this time.

## 2021-01-04 DIAGNOSIS — H4311 Vitreous hemorrhage, right eye: Secondary | ICD-10-CM | POA: Diagnosis not present

## 2021-01-04 DIAGNOSIS — E113511 Type 2 diabetes mellitus with proliferative diabetic retinopathy with macular edema, right eye: Secondary | ICD-10-CM | POA: Diagnosis not present

## 2021-01-04 DIAGNOSIS — H2511 Age-related nuclear cataract, right eye: Secondary | ICD-10-CM | POA: Diagnosis not present

## 2021-01-04 NOTE — Telephone Encounter (Signed)
Attempted to reach patient. Her vm is full and I am unable to leave a message at this time.

## 2021-01-05 DIAGNOSIS — H31091 Other chorioretinal scars, right eye: Secondary | ICD-10-CM | POA: Diagnosis not present

## 2021-01-05 NOTE — Telephone Encounter (Signed)
No return call received, will await further response from patient.

## 2021-01-06 ENCOUNTER — Telehealth: Payer: Self-pay | Admitting: Family Medicine

## 2021-01-06 NOTE — Progress Notes (Signed)
  Chronic Care Management   Outreach Note  01/06/2021 Name: Karen [name] MRN: [id number] DOB: 11-28-1955  Referred by: [name], MD Reason for referral : No chief complaint on file.   A second unsuccessful telephone outreach was attempted today. The patient was referred to pharmacist for assistance with care management and care coordination.  Follow Up Plan:   Tatjana Dellinger Upstream Scheduler

## 2021-01-10 ENCOUNTER — Encounter: Payer: Self-pay | Admitting: Family Medicine

## 2021-01-12 ENCOUNTER — Ambulatory Visit: Payer: HMO | Admitting: Family Medicine

## 2021-01-12 ENCOUNTER — Encounter: Payer: HMO | Admitting: Gastroenterology

## 2021-01-12 DIAGNOSIS — H4312 Vitreous hemorrhage, left eye: Secondary | ICD-10-CM | POA: Diagnosis not present

## 2021-01-12 DIAGNOSIS — E113593 Type 2 diabetes mellitus with proliferative diabetic retinopathy without macular edema, bilateral: Secondary | ICD-10-CM | POA: Diagnosis not present

## 2021-01-12 NOTE — Telephone Encounter (Signed)
Pt scheduled appt for 9/2 but wanted to be worked in Architectural technologist

## 2021-01-12 NOTE — Telephone Encounter (Signed)
Plz schedule OV today at 12:30 for wound check.  Pt is not aware.

## 2021-01-12 NOTE — Telephone Encounter (Signed)
Pt has been called and scheduled for 12:30 today

## 2021-01-12 NOTE — Telephone Encounter (Signed)
Pt called to cancel her appt at 12:30 today because she has another appt

## 2021-01-12 NOTE — Telephone Encounter (Signed)
Please schedule office visit for evaluation. May place at 12:30pm today

## 2021-01-12 NOTE — Telephone Encounter (Signed)
Noted  

## 2021-01-13 ENCOUNTER — Telehealth: Payer: Self-pay | Admitting: Family Medicine

## 2021-01-13 ENCOUNTER — Ambulatory Visit: Payer: HMO | Admitting: Family Medicine

## 2021-01-13 NOTE — Progress Notes (Signed)
  Chronic Care Management   Outreach Note  01/13/2021 Name: Karen [name] MRN: [id number] DOB: Mar 09, 1956  Referred by: [name], MD Reason for referral : No chief complaint on file.   Third unsuccessful telephone outreach was attempted today. The patient was referred to the pharmacist for assistance with care management and care coordination.   Follow Up Plan:   Tatjana Dellinger Upstream Scheduler

## 2021-01-13 NOTE — Telephone Encounter (Signed)
May schedule at 12:30pm today. Thanks.

## 2021-01-13 NOTE — Progress Notes (Signed)
  Chronic Care Management   Note  01/13/2021 Name: Karen [name] MRN: [id number] DOB: [birth year]  Karen [name] is a 65 y.o. year old female who is a primary care patient of [name], MD. I reached out to [name] by phone today in response to a referral sent by Karen [name]'s PCP, [name], MD.   Ms. Ryen was given information about Chronic Care Management services today including:  CCM service includes personalized support from designated clinical staff supervised by her physician, including individualized plan of care and coordination with other care providers 24/7 contact phone numbers for assistance for urgent and routine care needs. Service will only be billed when office clinical staff spend 20 minutes or more in a month to coordinate care. Only one practitioner may furnish and bill the service in a calendar month. The patient may stop CCM services at any time (effective at the end of the month) by phone call to the office staff.   Patient agreed to services and verbal consent obtained.   Follow up plan:   Tatjana Secretary/administrator

## 2021-01-15 ENCOUNTER — Other Ambulatory Visit: Payer: Self-pay

## 2021-01-15 ENCOUNTER — Encounter: Payer: Self-pay | Admitting: Family Medicine

## 2021-01-15 ENCOUNTER — Ambulatory Visit (INDEPENDENT_AMBULATORY_CARE_PROVIDER_SITE_OTHER): Payer: HMO | Admitting: Family Medicine

## 2021-01-15 VITALS — BP 116/68 | HR 70 | Temp 97.6°F | Ht 60.0 in | Wt 178.5 lb

## 2021-01-15 DIAGNOSIS — E1165 Type 2 diabetes mellitus with hyperglycemia: Secondary | ICD-10-CM | POA: Diagnosis not present

## 2021-01-15 DIAGNOSIS — S91301D Unspecified open wound, right foot, subsequent encounter: Secondary | ICD-10-CM | POA: Diagnosis not present

## 2021-01-15 DIAGNOSIS — IMO0002 Reserved for concepts with insufficient information to code with codable children: Secondary | ICD-10-CM

## 2021-01-15 DIAGNOSIS — E118 Type 2 diabetes mellitus with unspecified complications: Secondary | ICD-10-CM | POA: Diagnosis not present

## 2021-01-15 NOTE — Patient Instructions (Signed)
Wounds looking ok - no need for antibiotic pill. Continue mupirocin as needed. Keep legs elevated as able. No more flip flops.

## 2021-01-15 NOTE — Assessment & Plan Note (Addendum)
Reassuring exam. Previous erythema has resolved, now with residual slowly healing foot wound. rec continue mupirocin ointment as needed, keep planned podiatry f/u next week.

## 2021-01-15 NOTE — Progress Notes (Signed)
Patient ID: Karen [name], female    DOB: 09/11/1955, 65 y.o.   MRN: [id number]  This visit was conducted in person.  BP 116/68   Pulse 70   Temp 97.6 F (36.4 C) (Temporal)   Ht 5' (1.524 m)   Wt 178 lb 8 oz (81 kg)   SpO2 98%   BMI 34.86 kg/m    CC: check R foot wound  Subjective:   HPI: Karen [name] is a 65 y.o. female presenting on 01/15/2021 for Wound Check (Here to f/u on right foot.  )   DOI: ~12/21/2020 Cut sustained to R foot about a month ago - attributed to wearing flip flops. No pain - chronic numbness to feet at baseline. Denies streaking redness or fever, draining of foot. Also has healed wound with small scab to anterior R leg - present for 2 months, sustained after fall a the porch. When last seen, there was mild surrounding erythema to R foot wound - Rx mupirocin ointment to wounds. Since then, erythema has resolved. Upcoming podiatry appt next week.   DM - planning to start higher ozempic dose '1mg'$  weekly next week.      Relevant past medical, surgical, family and social history reviewed and updated as indicated. Interim medical history since our last visit reviewed. Allergies and medications reviewed and updated. Outpatient Medications Prior to Visit  Medication Sig Dispense Refill   acetaminophen (TYLENOL) 500 MG tablet Take 1,000 mg by mouth 3 (three) times daily as needed for mild pain.     aspirin EC 81 MG EC tablet Take 1 tablet (81 mg total) by mouth daily.     Continuous Blood Gluc Receiver (FREESTYLE LIBRE 2 READER) DEVI Use as directed to check sugars 3-4 times daily 1 each 0   Continuous Blood Gluc Sensor (FREESTYLE LIBRE 2 SENSOR) MISC Use as directed to check sugars 3-4 times daily 6 each 3   dicyclomine (BENTYL) 20 MG tablet Take 1 tablet (20 mg total) by mouth 2 (two) times daily as needed for spasms. (Patient taking differently: Take 20 mg by mouth as needed for spasms.) 30 tablet 3   DULoxetine (CYMBALTA) 20 MG capsule Take 1 capsule (20 mg total)  by mouth daily. 90 capsule 3   furosemide (LASIX) 20 MG tablet Take 1 tablet (20 mg total) by mouth daily as needed for edema. 30 tablet 11   gabapentin (NEURONTIN) 300 MG capsule TAKE ONE CAPSULE BY MOUTH AT BEDTIME 90 capsule 1   insulin degludec (TRESIBA FLEXTOUCH) 200 UNIT/ML FlexTouch Pen Inject 100 Units into the skin daily. 18 mL 11   Insulin Pen Needle (PEN NEEDLES) 31G X 8 MM MISC Use to inject Lantus as directed twice daily. Dx: E11.40 60 each 3   insulin regular (NOVOLIN R) 100 units/mL injection Inject 0.25 mLs (25 Units total) into the skin 3 (three) times daily before meals. + sliding scale insulin 5u for every 50 over 200 10 mL    KLOR-CON SPRINKLE 10 MEQ CR capsule Take 1 capsule (10 mEq total) by mouth daily as needed. When taking Lasix 30 capsule 11   losartan (COZAAR) 25 MG tablet Take 1 tablet (25 mg total) by mouth daily. 90 tablet 3   metFORMIN (GLUCOPHAGE-XR) 750 MG 24 hr tablet Take 2 tablets (1,500 mg total) by mouth every evening. 180 tablet 2   mupirocin ointment (BACTROBAN) 2 % Apply 1 application topically 2 (two) times daily. 22 g 0   nitroGLYCERIN (NITROSTAT) 0.4 MG  SL tablet Place 1 tablet (0.4 mg total) under the tongue every 5 (five) minutes as needed for chest pain. 25 tablet 0   omeprazole (PRILOSEC) 40 MG capsule Take 1 capsule (40 mg total) by mouth 2 (two) times daily. 180 capsule 3   polyethylene glycol (MIRALAX / GLYCOLAX) 17 g packet Take 17 g by mouth as needed.     prednisoLONE acetate (PRED FORTE) 1 % ophthalmic suspension Place 1 drop into the right eye 4 (four) times daily.     propranolol (INDERAL) 20 MG tablet Take 1 tablet (20 mg total) by mouth 2 (two) times daily. 180 tablet 3   rosuvastatin (CRESTOR) 10 MG tablet Take 1 tablet (10 mg total) by mouth daily. 90 tablet 3   Semaglutide, 1 MG/DOSE, 4 MG/3ML SOPN Inject 1 mg as directed once a week. 3 mL 11   spironolactone (ALDACTONE) 25 MG tablet TAKE ONE-HALF TABLET DAILY 45 tablet 3   traMADol  (ULTRAM) 50 MG tablet Take 1 tablet (50 mg total) by mouth daily as needed for moderate pain. 30 tablet 0   doxycycline (VIBRA-TABS) 100 MG tablet Take 1 tablet (100 mg total) by mouth 2 (two) times daily. 14 tablet 0   Facility-Administered Medications Prior to Visit  Medication Dose Route Frequency Provider Last Rate Last Admin   0.9 %  sodium chloride infusion  500 mL Intravenous Once Armbruster, Carlota Raspberry, MD         Per HPI unless specifically indicated in ROS section below Review of Systems  Objective:  BP 116/68   Pulse 70   Temp 97.6 F (36.4 C) (Temporal)   Ht 5' (1.524 m)   Wt 178 lb 8 oz (81 kg)   SpO2 98%   BMI 34.86 kg/m   Wt Readings from Last 3 Encounters:  01/15/21 178 lb 8 oz (81 kg)  12/28/20 179 lb (81.2 kg)  12/10/20 177 lb (80.3 kg)      Physical Exam Vitals and nursing note reviewed.  Constitutional:      Appearance: Normal appearance. She is not ill-appearing.  Musculoskeletal:       Legs:       Feet:  Skin:    General: Skin is warm and dry.     Findings: Wound present. No rash.     Comments:  Healing wound to anterior mid R leg with central scab present, no surrounding erythema or drainage.  Healing wounds to dorsal foot near interdigital space between 1st and 2nd toes, without surrounding drainage, erythema or induration  Neurological:     Mental Status: She is alert.      Results for orders placed or performed in visit on 12/28/20  POCT glycosylated hemoglobin (Hb A1C)  Result Value Ref Range   Hemoglobin A1C 8.1 (A) 4.0 - 5.6 %   HbA1c POC (<> result, manual entry)     HbA1c, POC (prediabetic range)     HbA1c, POC (controlled diabetic range)      Assessment & Plan:  This visit occurred during the SARS-CoV-2 public health emergency.  Safety protocols were in place, including screening questions prior to the visit, additional usage of staff PPE, and extensive cleaning of exam room while observing appropriate contact time as indicated for  disinfecting solutions.   Problem List Items Addressed This Visit     DM (diabetes mellitus), type 2, uncontrolled with complications ([place])    Planning to start full dose ozempic next week, discussed watching for GI side effects on higher  dose.       Unspecified open wound, right foot, subsequent encounter - Primary    Reassuring exam. Previous erythema has resolved, now with residual slowly healing foot wound. rec continue mupirocin ointment as needed, keep planned podiatry f/u next week.         No orders of the defined types were placed in this encounter.  No orders of the defined types were placed in this encounter.   Patient Instructions  Wounds looking ok - no need for antibiotic pill. Continue mupirocin as needed. Keep legs elevated as able. No more flip flops.   Follow up plan: Return if symptoms worsen or fail to improve.  [name], MD

## 2021-01-15 NOTE — Assessment & Plan Note (Signed)
Planning to start full dose ozempic next week, discussed watching for GI side effects on higher dose.

## 2021-01-19 ENCOUNTER — Other Ambulatory Visit: Payer: Self-pay | Admitting: Family Medicine

## 2021-01-19 DIAGNOSIS — E113593 Type 2 diabetes mellitus with proliferative diabetic retinopathy without macular edema, bilateral: Secondary | ICD-10-CM | POA: Diagnosis not present

## 2021-01-19 DIAGNOSIS — H43812 Vitreous degeneration, left eye: Secondary | ICD-10-CM | POA: Diagnosis not present

## 2021-01-19 DIAGNOSIS — H35371 Puckering of macula, right eye: Secondary | ICD-10-CM | POA: Diagnosis not present

## 2021-01-21 ENCOUNTER — Other Ambulatory Visit: Payer: Self-pay

## 2021-01-21 ENCOUNTER — Encounter: Payer: Self-pay | Admitting: Podiatry

## 2021-01-21 ENCOUNTER — Encounter: Payer: Self-pay | Admitting: Family Medicine

## 2021-01-21 ENCOUNTER — Ambulatory Visit (INDEPENDENT_AMBULATORY_CARE_PROVIDER_SITE_OTHER): Payer: HMO | Admitting: Podiatry

## 2021-01-21 DIAGNOSIS — E1142 Type 2 diabetes mellitus with diabetic polyneuropathy: Secondary | ICD-10-CM | POA: Diagnosis not present

## 2021-01-21 DIAGNOSIS — D2372 Other benign neoplasm of skin of left lower limb, including hip: Secondary | ICD-10-CM

## 2021-01-21 DIAGNOSIS — B351 Tinea unguium: Secondary | ICD-10-CM | POA: Diagnosis not present

## 2021-01-21 DIAGNOSIS — D2371 Other benign neoplasm of skin of right lower limb, including hip: Secondary | ICD-10-CM | POA: Diagnosis not present

## 2021-01-21 DIAGNOSIS — M79676 Pain in unspecified toe(s): Secondary | ICD-10-CM | POA: Diagnosis not present

## 2021-01-21 NOTE — Progress Notes (Signed)
She presents today for follow-up of her painful elongated toenails bilaterally.  She states that she is got some calluses that need to be trimmed also states that she is got this 1 little spot where she wore flip-flops and it caused irritation around her first and second toe.  She denies fever chills nausea vomiting muscle aches and pains states that her primary care provider had given her a prescription for Bactroban ointment.  Objective: Vital signs are stable she is alert and oriented x3 pulses are barely palpable bilateral capillary fill time is immediate.  She has excoriations and abrasions to the first webspace.  It is not open it is not bleeding do not see any signs of infection at this point.  Otherwise benign skin lesions are noted plantar aspect of the foot as well as pain in limb secondary to onychomycosis.  Pain in limb secondary to onychomycosis some abrasions between the toes and painful benign skin lesions.  Plan: Discussed etiology pathology and surgical therapies debrided benign skin lesions debrided nails suggest that she continue to place the ointment on the wounds until completely healed.  Should she notice any redness fever chills nausea vomiting purulence from the area should notify us or primary care immediately.

## 2021-01-22 NOTE — Telephone Encounter (Signed)
Will forward to [facility] to see if there's any patient assistance option.

## 2021-01-25 ENCOUNTER — Telehealth: Payer: Self-pay

## 2021-01-25 NOTE — Chronic Care Management (AMB) (Addendum)
Chronic Care Management Pharmacy Assistant   Name: Karen [name]  MRN: [id number] DOB: 1955-09-27  Reason for Encounter: Patient Assistance- Ozempic    Medications: Outpatient Encounter Medications as of 01/25/2021  Medication Sig Note   acetaminophen (TYLENOL) 500 MG tablet Take 1,000 mg by mouth 3 (three) times daily as needed for mild pain.    aspirin EC 81 MG EC tablet Take 1 tablet (81 mg total) by mouth daily.    Continuous Blood Gluc Receiver (FREESTYLE LIBRE 2 READER) DEVI Use as directed to check sugars 3-4 times daily    Continuous Blood Gluc Sensor (FREESTYLE LIBRE 2 SENSOR) MISC Use as directed to check sugars 3-4 times daily    dicyclomine (BENTYL) 20 MG tablet Take 1 tablet (20 mg total) by mouth 2 (two) times daily as needed for spasms. (Patient taking differently: Take 20 mg by mouth as needed for spasms.)    DULoxetine (CYMBALTA) 20 MG capsule Take 1 capsule (20 mg total) by mouth daily.    furosemide (LASIX) 20 MG tablet Take 1 tablet (20 mg total) by mouth daily as needed for edema.    gabapentin (NEURONTIN) 300 MG capsule TAKE ONE CAPSULE BY MOUTH AT BEDTIME    insulin degludec (TRESIBA FLEXTOUCH) 200 UNIT/ML FlexTouch Pen Inject 100 Units into the skin daily.    Insulin Pen Needle (PEN NEEDLES) 31G X 8 MM MISC Use to inject Lantus as directed twice daily. Dx: E11.40    insulin regular (NOVOLIN R) 100 units/mL injection Inject 0.25 mLs (25 Units total) into the skin 3 (three) times daily before meals. + sliding scale insulin 5u for every 50 over 200    KLOR-CON SPRINKLE 10 MEQ CR capsule Take 1 capsule (10 mEq total) by mouth daily as needed. When taking Lasix    losartan (COZAAR) 25 MG tablet Take 1 tablet (25 mg total) by mouth daily.    metFORMIN (GLUCOPHAGE-XR) 750 MG 24 hr tablet Take 2 tablets (1,500 mg total) by mouth every evening.    mupirocin ointment (BACTROBAN) 2 % Apply 1 application topically 2 (two) times daily.    nitroGLYCERIN (NITROSTAT) 0.4 MG SL  tablet Place 1 tablet (0.4 mg total) under the tongue every 5 (five) minutes as needed for chest pain. 12/08/2020: On hand   omeprazole (PRILOSEC) 40 MG capsule Take 1 capsule (40 mg total) by mouth 2 (two) times daily.    polyethylene glycol (MIRALAX / GLYCOLAX) 17 g packet Take 17 g by mouth as needed.    prednisoLONE acetate (PRED FORTE) 1 % ophthalmic suspension Place 1 drop into the right eye 4 (four) times daily.    propranolol (INDERAL) 20 MG tablet Take 1 tablet (20 mg total) by mouth 2 (two) times daily.    rosuvastatin (CRESTOR) 10 MG tablet Take 1 tablet (10 mg total) by mouth daily.    Semaglutide, 1 MG/DOSE, 4 MG/3ML SOPN Inject 1 mg as directed once a week.    spironolactone (ALDACTONE) 25 MG tablet TAKE ONE-HALF TABLET DAILY    traMADol (ULTRAM) 50 MG tablet Take 1 tablet (50 mg total) by mouth daily as needed for moderate pain.    Facility-Administered Encounter Medications as of 01/25/2021  Medication   0.9 %  sodium chloride infusion   Patient assistance application for Ozempic has been completed on behalf of the patient. Application will be mailed to patient. Patient aware she will need to provide requested income verification to be sent the manufacturer.   Debbora Dus, CPP notified  Margaretmary Dys, [place] [phone]  I have reviewed the care management and care coordination activities outlined in this encounter and I am certifying that I agree with the content of this note. No further action required.  Debbora Dus, PharmD Clinical Pharmacist [facility] [phone]

## 2021-01-29 ENCOUNTER — Encounter: Payer: HMO | Admitting: Gastroenterology

## 2021-01-29 ENCOUNTER — Telehealth: Payer: Self-pay

## 2021-01-29 NOTE — Progress Notes (Addendum)
Patient Assistance forms for the Ozempic have been mailed to the patient.  Debbora Dus, CPP notified  Avel Sensor, [place] Assistant [phone]

## 2021-01-30 ENCOUNTER — Other Ambulatory Visit: Payer: Self-pay

## 2021-01-30 ENCOUNTER — Emergency Department (HOSPITAL_COMMUNITY)
Admission: EM | Admit: 2021-01-30 | Discharge: 2021-01-31 | Disposition: A | Discharge status: Home / Self Care | Payer: HMO | Attending: Emergency Medicine | Admitting: Emergency Medicine

## 2021-01-30 ENCOUNTER — Emergency Department (HOSPITAL_COMMUNITY): Payer: HMO

## 2021-01-30 DIAGNOSIS — Z955 Presence of coronary angioplasty implant and graft: Secondary | ICD-10-CM | POA: Insufficient documentation

## 2021-01-30 DIAGNOSIS — I10 Essential (primary) hypertension: Secondary | ICD-10-CM | POA: Diagnosis not present

## 2021-01-30 DIAGNOSIS — X501XXA Overexertion from prolonged static or awkward postures, initial encounter: Secondary | ICD-10-CM | POA: Diagnosis not present

## 2021-01-30 DIAGNOSIS — S80912A Unspecified superficial injury of left knee, initial encounter: Secondary | ICD-10-CM | POA: Diagnosis not present

## 2021-01-30 DIAGNOSIS — E113519 Type 2 diabetes mellitus with proliferative diabetic retinopathy with macular edema, unspecified eye: Secondary | ICD-10-CM | POA: Diagnosis not present

## 2021-01-30 DIAGNOSIS — Z8616 Personal history of COVID-19: Secondary | ICD-10-CM | POA: Diagnosis not present

## 2021-01-30 DIAGNOSIS — Z7984 Long term (current) use of oral hypoglycemic drugs: Secondary | ICD-10-CM | POA: Diagnosis not present

## 2021-01-30 DIAGNOSIS — I2510 Atherosclerotic heart disease of native coronary artery without angina pectoris: Secondary | ICD-10-CM | POA: Insufficient documentation

## 2021-01-30 DIAGNOSIS — S8992XA Unspecified injury of left lower leg, initial encounter: Secondary | ICD-10-CM

## 2021-01-30 DIAGNOSIS — Z79899 Other long term (current) drug therapy: Secondary | ICD-10-CM | POA: Diagnosis not present

## 2021-01-30 DIAGNOSIS — E1140 Type 2 diabetes mellitus with diabetic neuropathy, unspecified: Secondary | ICD-10-CM | POA: Insufficient documentation

## 2021-01-30 DIAGNOSIS — M25562 Pain in left knee: Secondary | ICD-10-CM | POA: Insufficient documentation

## 2021-01-30 DIAGNOSIS — Z794 Long term (current) use of insulin: Secondary | ICD-10-CM | POA: Insufficient documentation

## 2021-01-30 DIAGNOSIS — Z7982 Long term (current) use of aspirin: Secondary | ICD-10-CM | POA: Diagnosis not present

## 2021-01-30 DIAGNOSIS — M25462 Effusion, left knee: Secondary | ICD-10-CM | POA: Diagnosis not present

## 2021-01-30 DIAGNOSIS — M1712 Unilateral primary osteoarthritis, left knee: Secondary | ICD-10-CM | POA: Diagnosis not present

## 2021-01-30 MED ORDER — NAPROXEN 375 MG PO TABS: 375.0000 mg | ORAL_TABLET | Freq: Two times a day (BID) | ORAL | 0 refills | Status: DC

## 2021-01-30 NOTE — Discharge Instructions (Signed)
Get help right away if: You cannot use your injured knee to support any of your body weight (cannot bear weight). You cannot move the injured joint. You cannot walk more than a few steps without pain or without your knee buckling. You have significant pain, swelling, or numbness in the leg below the cast, brace, or splint. Your foot or toes are numb, cold, or blue after loosening your splint or brace.

## 2021-01-30 NOTE — ED Triage Notes (Signed)
Pt came in with c/o L knee pain after [place]. Pt states that she can barely bear weight and it hurts to walk on it.
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
## 2021-01-31 NOTE — ED Provider Notes (Signed)
Discussed results of CT scan with patient.  She is discharged with a knee immobilizer, crutches, and referral to orthopedics.   Montine Circle, PA-C 01/31/21 0022    Molpus, Jenny Reichmann, MD 01/31/21 [phone]

## 2021-02-01 ENCOUNTER — Encounter: Payer: HMO | Admitting: Gastroenterology

## 2021-02-01 ENCOUNTER — Other Ambulatory Visit: Payer: Self-pay

## 2021-02-01 NOTE — Progress Notes (Signed)
Patient had 1st Twinrix on 12-30-20 but we are no longer able to get Twinrix vaccines.  Therefore patient rec'd 2nd (and final) Hep B on 9-19 and will be due for 2nd (and final) Hep A in 6 months from first Twinrix which will be 07-04-21.

## 2021-02-02 DIAGNOSIS — S83412A Sprain of medial collateral ligament of left knee, initial encounter: Secondary | ICD-10-CM | POA: Diagnosis not present

## 2021-02-09 DIAGNOSIS — H2512 Age-related nuclear cataract, left eye: Secondary | ICD-10-CM | POA: Diagnosis not present

## 2021-02-10 ENCOUNTER — Telehealth: Payer: Self-pay

## 2021-02-10 NOTE — Progress Notes (Addendum)
    Chronic Care Management Pharmacy Assistant   Name: DEANDREA VANPELT  MRN: [id number] DOB: 02-11-1956  Reason for Encounter: Patient Assistance Form Follow-Up ([place])   03/03/2021 - Called patient to verify she received PAP forms for [employer]). No answer. Left message.  02/22/2021 - Called patient to verify she received PAP forms for [employer]). No answer. Left message.  09/30./2022 - Called patient to verify she received PAP forms for [employer]). No answer. Left message.  02/11/2021 - Called patient to verify she received PAP forms for [employer]). No answer. Left message.  02/10/2021 - Called patient to verify she received PAP forms for [employer]). No answer. Left message.  01/28/2021 - Patient Assistance Forms for Ozempic through [employer] were mailed to patient on   01/26/2021 - Patient assistance forms for Ozempic were completed and uploaded.   Debbora Dus, CPP notified  Marijean Niemann, [place] Clinical Pharmacy Assistant [phone]   Time Spent: 10 Minutes

## 2021-02-22 ENCOUNTER — Ambulatory Visit: Payer: HMO | Admitting: Dietician

## 2021-03-01 ENCOUNTER — Other Ambulatory Visit: Payer: Self-pay

## 2021-03-01 ENCOUNTER — Encounter: Payer: Self-pay | Admitting: Family Medicine

## 2021-03-01 ENCOUNTER — Telehealth: Payer: HMO | Admitting: Family Medicine

## 2021-03-01 VITALS — Ht 60.0 in

## 2021-03-01 DIAGNOSIS — J029 Acute pharyngitis, unspecified: Secondary | ICD-10-CM

## 2021-03-01 DIAGNOSIS — R52 Pain, unspecified: Secondary | ICD-10-CM

## 2021-03-01 NOTE — Progress Notes (Signed)
      Karen Bernick T. Maher Shon, MD Primary Care and Sports Medicine [facility] [place], [address] Phone: [phone]  FAX: [name]  MRN [id number]  Date of Birth: 09-07-1955  Visit Date: 03/01/2021  PCP: [name], MD  Referred by: [name], MD  Virtual Visit via Video Note:  I connected with  Karen [name] on 03/01/2021  3:40 PM EDT by a video enabled telemedicine application and verified that I am speaking with the correct person using two identifiers.   Location patient: home computer, tablet, or smartphone Location provider: work or home office Consent: Verbal consent directly obtained from Karen [name]. Persons participating in the virtual visit: patient, provider  I discussed the limitations of evaluation and management by telemedicine and the availability of in person appointments. The patient expressed understanding and agreed to proceed.  Chief Complaint  Patient presents with   Cough   Sore Throat   Otalgia   Generalized Body Aches    History of Present Illness:  Did not answer Caregility or Doximity prompts approx 5 times yesterday and twice this morning on 03/02/2021.   Additional calls by [name] yesterday, too, without connection.  Calls by me this morning are also not answered.  We have left messages on her voicemail.    Review of Systems as above: See pertinent positives and pertinent negatives per HPI No acute distress verbally   Observations/Objective/Exam:  An attempt was made to discern vital signs over the phone and per patient if applicable and possible.   General:    Alert, Oriented, appears well and in no acute distress  Pulmonary:     On inspection no signs of respiratory distress.  Psych / Neurological:     Pleasant and cooperative.  Assessment and Plan:    ICD-10-CM   1. Body aches  R52     2. Sore throat  J02.9       Unable to reach patient despite man attempts.  I have asked her to call and let us know how she is doing.  At any point, I am happy to speak to her.  N/c  I discussed the assessment and treatment plan with the patient. The patient was provided an opportunity to ask questions and all were answered. The patient agreed with the plan and demonstrated an understanding of the instructions.   The patient was advised to call back or seek an in-person evaluation if the symptoms worsen or if the condition fails to improve as anticipated.  Follow-up: prn unless noted otherwise below No follow-ups on file.  No orders of the defined types were placed in this encounter.  No orders of the defined types were placed in this encounter.   Signed,  Maud Deed. Jacinto Keil, MD

## 2021-03-02 ENCOUNTER — Telehealth: Payer: HMO | Admitting: Family Medicine

## 2021-03-02 ENCOUNTER — Encounter: Payer: Self-pay | Admitting: Family Medicine

## 2021-03-02 ENCOUNTER — Other Ambulatory Visit: Payer: Self-pay

## 2021-03-03 ENCOUNTER — Telehealth (INDEPENDENT_AMBULATORY_CARE_PROVIDER_SITE_OTHER): Payer: HMO | Admitting: Nurse Practitioner

## 2021-03-03 DIAGNOSIS — R52 Pain, unspecified: Secondary | ICD-10-CM

## 2021-03-03 DIAGNOSIS — R051 Acute cough: Secondary | ICD-10-CM | POA: Insufficient documentation

## 2021-03-03 DIAGNOSIS — J029 Acute pharyngitis, unspecified: Secondary | ICD-10-CM | POA: Diagnosis not present

## 2021-03-03 MED ORDER — PEN NEEDLES 31G X 8 MM MISC: 3 refills | Status: DC

## 2021-03-03 MED ORDER — INSULIN REGULAR HUMAN 100 UNIT/ML IJ SOLN: 25.0000 [IU] | Freq: Three times a day (TID) | INTRAMUSCULAR | 6 refills | Status: DC

## 2021-03-03 NOTE — Assessment & Plan Note (Signed)
Sore throat does have history of strep has also had a tonsillectomy.  Pending strep test

## 2021-03-03 NOTE — Assessment & Plan Note (Signed)
Pending strep and flu test.

## 2021-03-03 NOTE — Progress Notes (Signed)
Pt cancelled appt

## 2021-03-03 NOTE — Progress Notes (Signed)
Patient ID: Karen [name], female    DOB: 1955/07/08, 65 y.o.   MRN: [id number]  Virtual visit completed through [employer], a video enabled telemedicine application. Due to national recommendations of social distancing due to COVID-19, a virtual visit is felt to be most appropriate for this patient at this time. Reviewed limitations, risks, security and privacy concerns of performing a virtual visit and the availability of in person appointments. I also reviewed that there may be a patient responsible charge related to this service. The patient agreed to proceed.   Patient location: home Provider location: [place] [facility], office Persons participating in this virtual visit: patient, provider   If any vitals were documented, they were collected by patient at home unless specified below.    There were no vitals taken for this visit.   CC: Body Aches, Yeast infection Subjective:   HPI: Karen [name] is a 65 y.o. female presenting on 03/03/2021 for Generalized Body Aches (Sx started around 02/20/21. Cough, sneezing, ear pain, head congestion at first but better now, scratchy sore throat, achy all over, chills. No fever. Did not take Covid test-had Covid in July) and Rash (Started around 02/27/21-Yeast infection under breasts area and belly area-she is diabetic-she had to stop taking Ozempic due to the cost recently. Has used Monistat mixed with eucerin cream-helping some.)  Symptoms started approx 1.5 weeks ago. Generalized body aches Has been vaccinated against covid Recent infection of coivd in July 2022, feels different. Has had strep in the past and experiencing a sore throat Taking cordicin and tylenol helps with body aches.   Relevant past medical, surgical, family and social history reviewed and updated as indicated. Interim medical history since our last visit reviewed. Allergies and medications reviewed and updated. Outpatient Medications Prior to Visit  Medication Sig  Dispense Refill   acetaminophen (TYLENOL) 500 MG tablet Take 1,000 mg by mouth 3 (three) times daily as needed for mild pain.     aspirin EC 81 MG EC tablet Take 1 tablet (81 mg total) by mouth daily.     Continuous Blood Gluc Receiver (FREESTYLE LIBRE 2 READER) DEVI Use as directed to check sugars 3-4 times daily 1 each 0   Continuous Blood Gluc Sensor (FREESTYLE LIBRE 2 SENSOR) MISC Use as directed to check sugars 3-4 times daily 6 each 3   dicyclomine (BENTYL) 20 MG tablet Take 1 tablet (20 mg total) by mouth 2 (two) times daily as needed for spasms. (Patient taking differently: Take 20 mg by mouth as needed for spasms.) 30 tablet 3   DULoxetine (CYMBALTA) 20 MG capsule Take 1 capsule (20 mg total) by mouth daily. 90 capsule 3   furosemide (LASIX) 20 MG tablet Take 1 tablet (20 mg total) by mouth daily as needed for edema. 30 tablet 11   gabapentin (NEURONTIN) 300 MG capsule TAKE ONE CAPSULE BY MOUTH AT BEDTIME 90 capsule 1   insulin degludec (TRESIBA FLEXTOUCH) 200 UNIT/ML FlexTouch Pen Inject 100 Units into the skin daily. 18 mL 11   Insulin Pen Needle (PEN NEEDLES) 31G X 8 MM MISC Use to inject Lantus as directed twice daily. Dx: E11.40 60 each 3   insulin regular (NOVOLIN R) 100 units/mL injection Inject 0.25 mLs (25 Units total) into the skin 3 (three) times daily before meals. + sliding scale insulin 5u for every 50 over 200 10 mL    KLOR-CON SPRINKLE 10 MEQ CR capsule Take 1 capsule (10 mEq total) by mouth daily as  needed. When taking Lasix 30 capsule 11   losartan (COZAAR) 25 MG tablet Take 1 tablet (25 mg total) by mouth daily. 90 tablet 3   metFORMIN (GLUCOPHAGE-XR) 750 MG 24 hr tablet Take 2 tablets (1,500 mg total) by mouth every evening. 180 tablet 2   mupirocin ointment (BACTROBAN) 2 % Apply 1 application topically 2 (two) times daily. 22 g 0   nitroGLYCERIN (NITROSTAT) 0.4 MG SL tablet Place 1 tablet (0.4 mg total) under the tongue every 5 (five) minutes as needed for chest pain. 25  tablet 0   omeprazole (PRILOSEC) 40 MG capsule Take 1 capsule (40 mg total) by mouth 2 (two) times daily. 180 capsule 3   polyethylene glycol (MIRALAX / GLYCOLAX) 17 g packet Take 17 g by mouth as needed.     propranolol (INDERAL) 20 MG tablet Take 1 tablet (20 mg total) by mouth 2 (two) times daily. 180 tablet 3   rosuvastatin (CRESTOR) 10 MG tablet Take 1 tablet (10 mg total) by mouth daily. 90 tablet 3   spironolactone (ALDACTONE) 25 MG tablet TAKE ONE-HALF TABLET DAILY 45 tablet 3   traMADol (ULTRAM) 50 MG tablet Take 1 tablet (50 mg total) by mouth daily as needed for moderate pain. 30 tablet 0   Semaglutide, 1 MG/DOSE, 4 MG/3ML SOPN Inject 1 mg as directed once a week. (Patient not taking: Reported on 03/03/2021) 3 mL 11   prednisoLONE acetate (PRED FORTE) 1 % ophthalmic suspension Place 1 drop into the right eye 4 (four) times daily.     No facility-administered medications prior to visit.     Per HPI unless specifically indicated in ROS section below Review of Systems  Constitutional:  Positive for chills and fatigue. Negative for fever.  HENT:  Positive for congestion, ear pain, sneezing and sore throat.   Respiratory:  Positive for cough. Negative for shortness of breath.   Cardiovascular:  Negative for chest pain.  Gastrointestinal:  Negative for diarrhea, nausea and vomiting.  Musculoskeletal:  Positive for myalgias.  Neurological:  Positive for headaches.  Objective:  There were no vitals taken for this visit.  Wt Readings from Last 3 Encounters:  01/15/21 178 lb 8 oz (81 kg)  12/28/20 179 lb (81.2 kg)  12/10/20 177 lb (80.3 kg)      Physical exam: Gen: alert, NAD, not ill appearing Pulm: speaks in complete sentences without increased work of breathing Psych: normal mood, normal thought content      Results for orders placed or performed in visit on 12/28/20  POCT glycosylated hemoglobin (Hb A1C)  Result Value Ref Range   Hemoglobin A1C 8.1 (A) 4.0 - 5.6 %    HbA1c POC (<> result, manual entry)     HbA1c, POC (prediabetic range)     HbA1c, POC (controlled diabetic range)     Assessment & Plan:   Problem List Items Addressed This Visit   None    No orders of the defined types were placed in this encounter.  No orders of the defined types were placed in this encounter.   I discussed the assessment and treatment plan with the patient. The patient was provided an opportunity to ask questions and all were answered. The patient agreed with the plan and demonstrated an understanding of the instructions. The patient was advised to call back or seek an in-person evaluation if the symptoms worsen or if the condition fails to improve as anticipated.  Follow up plan: No follow-ups on file.  Romilda Garret, NP

## 2021-03-03 NOTE — Telephone Encounter (Signed)
E-scribed refills.  

## 2021-03-03 NOTE — Assessment & Plan Note (Signed)
Acute onset for approximately 1/2 weeks.  Patient states while he was getting better and then got worse does not feel like COVID she recently was infected.  No change in urine color or urinary symptoms.  Pending flu and strep test.

## 2021-03-04 ENCOUNTER — Other Ambulatory Visit (INDEPENDENT_AMBULATORY_CARE_PROVIDER_SITE_OTHER): Payer: HMO

## 2021-03-04 ENCOUNTER — Other Ambulatory Visit: Payer: Self-pay | Admitting: Nurse Practitioner

## 2021-03-04 DIAGNOSIS — R051 Acute cough: Secondary | ICD-10-CM

## 2021-03-04 DIAGNOSIS — J020 Streptococcal pharyngitis: Secondary | ICD-10-CM

## 2021-03-04 DIAGNOSIS — J029 Acute pharyngitis, unspecified: Secondary | ICD-10-CM | POA: Diagnosis not present

## 2021-03-04 DIAGNOSIS — B372 Candidiasis of skin and nail: Secondary | ICD-10-CM

## 2021-03-04 DIAGNOSIS — R52 Pain, unspecified: Secondary | ICD-10-CM

## 2021-03-04 LAB — POCT INFLUENZA A/B
Influenza A, POC: NEGATIVE
Influenza B, POC: NEGATIVE

## 2021-03-04 LAB — POCT RAPID STREP A (OFFICE): Rapid Strep A Screen: POSITIVE — AB

## 2021-03-04 MED ORDER — AZITHROMYCIN 250 MG PO TABS: ORAL_TABLET | ORAL | 0 refills | Status: AC

## 2021-03-04 MED ORDER — FLUCONAZOLE 150 MG PO TABS: 150.0000 mg | ORAL_TABLET | Freq: Once | ORAL | 0 refills | Status: AC

## 2021-03-04 NOTE — Progress Notes (Signed)
Did talk to patient about strep results. Has PCN allergy and that involved hives. States that she does not want to try anything related to PCN. Has tolerated azithromycin well.

## 2021-03-09 ENCOUNTER — Telehealth: Payer: Self-pay

## 2021-03-09 NOTE — Progress Notes (Addendum)
    Chronic Care Management Pharmacy Assistant   Name: Karen [name]  MRN: [id number] DOB: December 20, 1955   Reason for Encounter: CCM (Patient [place])  Attempted contact with patient multiple times as stated below. Unsuccessful outreach. Patient may contact team if still interested in patient assistance.   03/09/2021 - Called patient to verify she received PAP forms for [employer]). No answer. Left Message.  Time Spent: 5 Minutes  03/03/2021 - Called patient to verify she received PAP forms for [employer]). No answer. Left message.   02/22/2021 - Called patient to verify she received PAP forms for [employer]). No answer. Left message.  09/30./2022 - Called patient to verify she received PAP forms for [employer]). No answer. Left message.   02/11/2021 - Called patient to verify she received PAP forms for [employer]). No answer. Left message.   02/10/2021 - Called patient to verify she received PAP forms for [employer]). No answer. Left message.   01/28/2021 - Patient Assistance Forms for Ozempic through [employer] were mailed to patient on    01/26/2021 - Patient assistance forms for Ozempic were completed and uploaded.   Debbora Dus, CPP notified   Marijean Niemann, [place] Clinical Pharmacy Assistant [phone]

## 2021-03-10 DIAGNOSIS — E113591 Type 2 diabetes mellitus with proliferative diabetic retinopathy without macular edema, right eye: Secondary | ICD-10-CM | POA: Diagnosis not present

## 2021-03-10 DIAGNOSIS — H4312 Vitreous hemorrhage, left eye: Secondary | ICD-10-CM | POA: Diagnosis not present

## 2021-03-10 DIAGNOSIS — E113512 Type 2 diabetes mellitus with proliferative diabetic retinopathy with macular edema, left eye: Secondary | ICD-10-CM | POA: Diagnosis not present

## 2021-03-10 LAB — HM DIABETES EYE EXAM

## 2021-03-16 ENCOUNTER — Telehealth: Payer: Self-pay
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
## 2021-03-17 ENCOUNTER — Ambulatory Visit (INDEPENDENT_AMBULATORY_CARE_PROVIDER_SITE_OTHER): Payer: HMO | Admitting: Pharmacist

## 2021-03-17 ENCOUNTER — Other Ambulatory Visit: Payer: Self-pay

## 2021-03-17 ENCOUNTER — Ambulatory Visit: Payer: HMO

## 2021-03-17 DIAGNOSIS — E113513 Type 2 diabetes mellitus with proliferative diabetic retinopathy with macular edema, bilateral: Secondary | ICD-10-CM

## 2021-03-17 DIAGNOSIS — I2510 Atherosclerotic heart disease of native coronary artery without angina pectoris: Secondary | ICD-10-CM

## 2021-03-17 DIAGNOSIS — E1169 Type 2 diabetes mellitus with other specified complication: Secondary | ICD-10-CM

## 2021-03-17 DIAGNOSIS — E785 Hyperlipidemia, unspecified: Secondary | ICD-10-CM

## 2021-03-17 DIAGNOSIS — K7469 Other cirrhosis of liver: Secondary | ICD-10-CM

## 2021-03-17 DIAGNOSIS — F331 Major depressive disorder, recurrent, moderate: Secondary | ICD-10-CM

## 2021-03-17 DIAGNOSIS — I10 Essential (primary) hypertension: Secondary | ICD-10-CM

## 2021-03-17 DIAGNOSIS — Z9861 Coronary angioplasty status: Secondary | ICD-10-CM

## 2021-03-17 DIAGNOSIS — R52 Pain, unspecified: Secondary | ICD-10-CM
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
## 2021-03-19 ENCOUNTER — Encounter: Payer: Self-pay | Admitting: Family Medicine

## 2021-03-19 DIAGNOSIS — F331 Major depressive disorder, recurrent, moderate: Secondary | ICD-10-CM

## 2021-03-19 MED ORDER — VENLAFAXINE HCL ER 37.5 MG PO CP24: 37.5000 mg | ORAL_CAPSULE | Freq: Every day | ORAL | 3 refills | Status: DC

## 2021-03-19 NOTE — Patient Instructions (Signed)
Visit Information  Phone number for Pharmacist: [phone]  Thank you for meeting with me to discuss your medications! I look forward to working with you to achieve your health care goals. Below is a summary of what we talked about during the visit:   Goals Addressed   None     Care Plan : Karen [name]  Updates made by Charlton Haws, RPH since 03/19/2021 12:00 AM     Problem: Hypertension, Hyperlipidemia, Diabetes, Coronary Artery Disease, Depression, and Cirrhosis   Priority: High     Long-Range Goal: Disease management   Start Date: 03/19/2021  Expected End Date: 03/19/2022  This Visit's Progress: On track  Priority: High  Note:   Current Barriers:  Unable to independently afford treatment regimen Unable to independently monitor therapeutic efficacy Suboptimal therapeutic regimen for depression  Pharmacist Clinical Goal(s):  Patient will verbalize ability to afford treatment regimen achieve adherence to monitoring guidelines and medication adherence to achieve therapeutic efficacy adhere to plan to optimize therapeutic regimen for depression as evidenced by report of adherence to recommended medication management changes through collaboration with PharmD and provider.   Interventions: 1:1 collaboration with [name], MD regarding development and update of comprehensive plan of care as evidenced by provider attestation and co-signature Inter-disciplinary care team collaboration (see longitudinal plan of care) Comprehensive medication review performed; medication list updated in electronic medical record  Hypertension (BP goal <130/80) -Controlled - pt is not checking BP at home; BP in recent office visits have been at goal; pt endorses compliance with meds as presribed -hx paroxysmal SVT -Current treatment: Furosemide 20 mg daily PRN Losartan 25 mg daily HS Propranolol 20 mg BID Spironolactone 25 mg - 1/2 tab daily AM Klor Con 10 mEq daily w/  lasix -Denies hypotensive/hypertensive symptoms -Educated on BP goals and benefits of medications for prevention of heart attack, stroke and kidney damage; Daily salt intake goal < 2300 mg; Exercise goal of 150 minutes per week; Importance of home blood pressure monitoring; -Counseled to monitor BP at home periodically -Recommended to continue current medication  Hyperlipidemia / CAD: (LDL goal < 70) -Controlled - LDL is at goal; pt endorses compliance with statin as prescribed -Hx CAD - PCI w/ DES 2014; ideally indefinite DAPT, previously unable to afford -Current treatment: Rosuvastatin 10 mg daily HS Nitroglycerin 0.4 mg SL prn Aspirin 81 mg daily HS -Medications previously tried: clopidogrel, Brilinta, atorvastatin -Educated on Cholesterol goals; Benefits of statin for ASCVD risk reduction; Exercise goal of 150 minutes per week; -Recommended to continue current medication  Diabetes (A1c goal <7%) -Uncontrolled - A1c is above goal; pt report sugar readings via CGM consistently in 200-300s; she has not had Ozempic since ~Sept 2022 due to donut hole cost; she has received PAP paperwork but has not filled it out yet -Dx 1993 -Current home glucose readings (via CGM)  AVG glucose: 217 (7-day)  AM: 264, 138,  PM: 357, 337, 240, 163, 300, 333, 339, 278, 258 -Current medications: Tresiba 200 unit/mL - 100 units daily Novolin R - 25-50 units TID w/ meals (sliding scale) Metformin ER 750 mg - 2 tab PM Ozempic 1 mg weekly Freestyle Libre 2 -Denies hypoglycemic/hyperglycemic symptoms -Current meal patterns: she has been advised previously to limit carbs - 45 g w meals, 15  w/ snacks; she goes out to eat a lot; she tries to avoid starches; she tries to buy sugar-free or no-sugar-added or keto options; -Current exercise: walking the dog -Educated on A1c and blood sugar  goals; Complications of diabetes including kidney damage, retinal damage, and cardiovascular disease; Exercise goal of 150  minutes per week; Carbohydrate counting and/or plate method -Advised pt to complete Ozempic paperwork and fax to PharmD once complete -Recommend to restart Ozempic at 1 mg once PAP complete/approved  Depression (Goal: manage symptoms) -Not ideally controlled - pt reports mood is "erratic" and pt wants to increase duloxetine dose; of note she previously did well with sertraline but was switched to duloxetine for possible fibromyalgia; pt does report duloxetine helps with her pain -Current treatment: Duloxetine 20 mg daily -Medications previously tried/failed: sertraline, citalopram -PHQ9: 2 (06/2020) -Connected with PCP for mental health support -counseled on risk for hepatotoxicity with duloxetine in cirrhosis; it is generally recommended to avoid duloxetine in cirrhosis due to risk for acute hepatic failure -no need for tapering due to low dose duloxetine -Recommend to stop duloxetine and restart sertraline 50 mg daily  GERD (Goal: manage symtpoms) -Controlled - per pt omeprazole is the only PPI that works for her -Current treatment  Omeprazole 40 mg BID -Medications previously tried: pantoprazole  -Recommended to continue current medication  Pain / Neuropathy (Goal: manage symptoms) -Controlled - pt reports pain is relatively controlled with current regimen -Lumbar radiculopathy; arthritis;  -Current treatment  Gabapentin 300 mg HS Duloxetine 20 mg daily Tylenol 500 mg Prn Tramadol 50 mg PRN - not using -See "depression" section re: duloxetine issues in cirrhosis -Recommended to continue current medication  Cirrhosis (Goal: maintain compensation) -Not ideally controlled -Follows with GI (Dr Havery Moros); Dx 10/2018 via CT, delayed further testing d/t self-pay at the time; 11/2020 ordered liver US and biopsy to determine source of cirrhosis, pt has not had these done yet due to scheduling conflicts; GI considers her cirrhosis compensated currently -Received Hep A/B vaccine #1  12/30/20, Hep B vaccine 02/01/21, Hep A vaccine due 07/04/21 -Child-Pugh A (5 points), MELD 7- score calculated using labs from June-July 2022:  -Current treatment  Propranolol 20 mg BID (varices) -Discussed risk of hepatotoxicity with duloxetine; it is generally recommended to avoid duloxetine in cirrhosis -Advised to reschedule Liver US and biopsy  Health Maintenance -Vaccine gaps: Shingrix, Covid booster, Flu,  -Twinrix given 12/30/20, Hep B given 9/19, Hep A due 07/04/21 -Reschedule colonoscopy, nutrition consult -Current therapy:  Elderberry (Sambucol) PRN -Patient is satisfied with current therapy and denies issues -Advised pt to get Flu shot first, wait 2 weeks and get Covid booster, wait 2 weeks and get Shingrix  Patient Goals/Self-Care Activities Patient will:  - take medications as prescribed as evidenced by patient report and record review focus on medication adherence by pill box check glucose via [employer] collaborate with provider on medication access solutions (complete Ozempic PAP) target a minimum of 150 minutes of moderate intensity exercise weekly engage in dietary modifications by reducing carbs      Karen [name] was given information about Chronic Care Management services today including:  CCM service includes personalized support from designated clinical staff supervised by her physician, including individualized plan of care and coordination with other care providers 24/7 contact phone numbers for assistance for urgent and routine care needs. Standard insurance, coinsurance, copays and deductibles apply for chronic care management only during months in which we provide at least 20 minutes of these services. Most insurances cover these services at 100%, however patients may be responsible for any copay, coinsurance and/or deductible if applicable. This service may help you avoid the need for more expensive face-to-face services. Only one practitioner may furnish and  bill the service in a calendar month. The patient may stop CCM services at any time (effective at the end of the month) by phone call to the office staff.  Patient agreed to services and verbal consent obtained.   Patient verbalizes understanding of instructions provided today and agrees to view in [place].  Telephone follow up appointment with pharmacy team member scheduled for: 2 months  [name], PharmD, [place], CPP Clinical Pharmacist [facility] [phone]

## 2021-03-25 ENCOUNTER — Encounter: Payer: Self-pay | Admitting: Family Medicine

## 2021-03-30 ENCOUNTER — Telehealth: Payer: Self-pay | Admitting: Family Medicine

## 2021-03-30 ENCOUNTER — Ambulatory Visit: Payer: HMO | Admitting: Family Medicine

## 2021-03-30 NOTE — Telephone Encounter (Signed)
Pt called in stating she need an update on her ozempic medication request. Pt blood sugar is running high

## 2021-04-01 ENCOUNTER — Telehealth: Payer: Self-pay

## 2021-04-01 NOTE — Telephone Encounter (Signed)
Addressed. See telephone note from 11/15.

## 2021-04-01 NOTE — Progress Notes (Signed)
    Chronic Care Management Pharmacy Assistant   Name: KAHLYN SHIPPEY  MRN: [id number] DOB: 03-18-1956  Reason for Encounter: CCM (Ozempic Approved)  04/01/2021 - Spoke with [employer] in regards to patient assistance for Ozempic. Patient was approved through 04/14/2021. Medication is in the process of being shipped and should arrive at Dr. Bosie Clos office in 7-10 business days. Called patient to inform her; No answer on either phone number; left patient a voicemail on both numbers letting her know she was approved and it is being sent.  Debbora Dus, CPP notified  Marijean Niemann, [place] Clinical Pharmacy Assistant [phone]  Time Spent:  20 Minutes

## 2021-04-05 ENCOUNTER — Ambulatory Visit: Payer: HMO | Admitting: Family Medicine

## 2021-04-05 ENCOUNTER — Telehealth: Payer: Self-pay | Admitting: Family Medicine

## 2021-04-05 NOTE — Telephone Encounter (Signed)
Do you know the reason why she cancelled today?

## 2021-04-05 NOTE — Telephone Encounter (Signed)
Pt late cancelled 8am appt today. What was reason for late cancellation?

## 2021-04-06 ENCOUNTER — Telehealth: Payer: HMO

## 2021-04-07 ENCOUNTER — Ambulatory Visit: Payer: HMO | Admitting: Family Medicine

## 2021-04-12 ENCOUNTER — Other Ambulatory Visit: Payer: Self-pay | Admitting: Family Medicine

## 2021-04-13 ENCOUNTER — Ambulatory Visit: Payer: HMO | Admitting: Family Medicine

## 2021-04-13 NOTE — Telephone Encounter (Signed)
Refill request Gabapentin Last refill 10/22/20 #90/1 Last visit video acute 10/196/22 Upcoming appointment 04/21/21

## 2021-04-14 DIAGNOSIS — Z7984 Long term (current) use of oral hypoglycemic drugs: Secondary | ICD-10-CM

## 2021-04-14 DIAGNOSIS — I2510 Atherosclerotic heart disease of native coronary artery without angina pectoris: Secondary | ICD-10-CM | POA: Diagnosis not present

## 2021-04-14 DIAGNOSIS — E785 Hyperlipidemia, unspecified: Secondary | ICD-10-CM

## 2021-04-14 DIAGNOSIS — Z9861 Coronary angioplasty status: Secondary | ICD-10-CM | POA: Diagnosis not present

## 2021-04-14 DIAGNOSIS — I10 Essential (primary) hypertension: Secondary | ICD-10-CM | POA: Diagnosis not present

## 2021-04-14 DIAGNOSIS — E1159 Type 2 diabetes mellitus with other circulatory complications: Secondary | ICD-10-CM

## 2021-04-14 DIAGNOSIS — Z794 Long term (current) use of insulin: Secondary | ICD-10-CM

## 2021-04-14 DIAGNOSIS — F331 Major depressive disorder, recurrent, moderate: Secondary | ICD-10-CM

## 2021-04-14 DIAGNOSIS — E113513 Type 2 diabetes mellitus with proliferative diabetic retinopathy with macular edema, bilateral: Secondary | ICD-10-CM | POA: Diagnosis not present

## 2021-04-14 NOTE — Telephone Encounter (Signed)
ERx 

## 2021-04-21 ENCOUNTER — Ambulatory Visit: Payer: HMO | Admitting: Family Medicine

## 2021-04-23 ENCOUNTER — Telehealth: Payer: Self-pay

## 2021-04-23 NOTE — Chronic Care Management (AMB) (Signed)
Chronic Care Management Pharmacy Assistant   Name: Karen [name]  MRN: [id number] DOB: Nov 09, 1955   Reason for Encounter: Reminder Call   Conditions to be addressed/monitored: CAD, HTN, HLD, and DMII  Medications: Outpatient Encounter Medications as of 04/23/2021  Medication Sig Note   acetaminophen (TYLENOL) 500 MG tablet Take 1,000 mg by mouth 3 (three) times daily as needed for mild pain.    aspirin EC 81 MG EC tablet Take 1 tablet (81 mg total) by mouth daily.    Continuous Blood Gluc Receiver (FREESTYLE LIBRE 2 READER) DEVI Use as directed to check sugars 3-4 times daily    Continuous Blood Gluc Sensor (FREESTYLE LIBRE 2 SENSOR) MISC Use as directed to check sugars 3-4 times daily    dicyclomine (BENTYL) 20 MG tablet Take 1 tablet (20 mg total) by mouth 2 (two) times daily as needed for spasms. (Patient taking differently: Take 20 mg by mouth as needed for spasms.)    furosemide (LASIX) 20 MG tablet Take 1 tablet (20 mg total) by mouth daily as needed for edema.    gabapentin (NEURONTIN) 300 MG capsule TAKE ONE CAPSULE BY MOUTH AT BEDTIME    insulin degludec (TRESIBA FLEXTOUCH) 200 UNIT/ML FlexTouch Pen Inject 100 Units into the skin daily.    Insulin Pen Needle (PEN NEEDLES) 31G X 8 MM MISC Use to inject Lantus as directed twice daily. Dx: E11.40    insulin regular (NOVOLIN R) 100 units/mL injection Inject 0.25 mLs (25 Units total) into the skin 3 (three) times daily before meals. + sliding scale insulin 5u for every 50 over 200    KLOR-CON SPRINKLE 10 MEQ CR capsule Take 1 capsule (10 mEq total) by mouth daily as needed. When taking Lasix    losartan (COZAAR) 25 MG tablet Take 1 tablet (25 mg total) by mouth daily.    metFORMIN (GLUCOPHAGE-XR) 750 MG 24 hr tablet Take 2 tablets (1,500 mg total) by mouth every evening.    mupirocin ointment (BACTROBAN) 2 % Apply 1 application topically 2 (two) times daily.    nitroGLYCERIN (NITROSTAT) 0.4 MG SL tablet Place 1 tablet (0.4 mg  total) under the tongue every 5 (five) minutes as needed for chest pain. 12/08/2020: On hand   omeprazole (PRILOSEC) 40 MG capsule Take 1 capsule (40 mg total) by mouth 2 (two) times daily.    polyethylene glycol (MIRALAX / GLYCOLAX) 17 g packet Take 17 g by mouth as needed.    propranolol (INDERAL) 20 MG tablet Take 1 tablet (20 mg total) by mouth 2 (two) times daily.    rosuvastatin (CRESTOR) 10 MG tablet Take 1 tablet (10 mg total) by mouth daily.    Semaglutide, 1 MG/DOSE, 4 MG/3ML SOPN Inject 1 mg as directed once a week. (Patient not taking: Reported on 03/17/2021)    spironolactone (ALDACTONE) 25 MG tablet TAKE ONE-HALF TABLET DAILY    traMADol (ULTRAM) 50 MG tablet Take 1 tablet (50 mg total) by mouth daily as needed for moderate pain.    venlafaxine XR (EFFEXOR XR) 37.5 MG 24 hr capsule Take 1 capsule (37.5 mg total) by mouth daily with breakfast.    No facility-administered encounter medications on file as of 04/23/2021.    Karen [name] did not answer the telephone  to remind her of her upcoming telephone visit with Karen [name] on 04/29/21 at 1:00pm. Patient was reminded to have all medications, supplements and any blood glucose and blood pressure readings available for review at appointment. Mailbox was  full    Star Rating Drugs: Medication:  Last Fill: Day Supply Tresiba  04/01/21 36 NovolinR  04/12/21 13 Losartan 25mg  04/12/21 90 Rosuvastatin 10mg  02/18/21 90 Metformin 750mg  02/09/21 [place], CPP notified  Avel Sensor, [place] Assistant [phone]  Total time spent for month CPA: 10 min

## 2021-04-27 ENCOUNTER — Ambulatory Visit: Payer: HMO | Admitting: Podiatry

## 2021-04-29 ENCOUNTER — Telehealth: Payer: Self-pay | Admitting: Pharmacist

## 2021-04-29 ENCOUNTER — Telehealth: Payer: HMO
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
## 2021-04-29 NOTE — Telephone Encounter (Signed)
°  Chronic Care Management   Outreach Note  04/29/2021 Name: Karen [name] MRN: [id number] DOB: 1955/11/29  Referred by: [name], MD  Patient had a phone appointment scheduled with clinical pharmacist today.  An unsuccessful telephone outreach was attempted today. The patient was referred to the pharmacist for assistance with medications, care management and care coordination.   Patient will NOT be penalized in any way for missing a CCM appointment. The no-show fee does not apply.  If possible, a message was left to return call to: [phone] or to [facility].  [name], PharmD, BCACP Clinical Pharmacist [facility] at [facility] [phone]

## 2021-04-30 ENCOUNTER — Telehealth: Payer: Self-pay

## 2021-04-30 ENCOUNTER — Other Ambulatory Visit: Payer: Self-pay

## 2021-04-30 ENCOUNTER — Ambulatory Visit (INDEPENDENT_AMBULATORY_CARE_PROVIDER_SITE_OTHER): Payer: HMO | Admitting: Family Medicine

## 2021-04-30 ENCOUNTER — Encounter: Payer: Self-pay | Admitting: Family Medicine

## 2021-04-30 VITALS — BP 118/64 | HR 66 | Temp 97.5°F | Ht 60.0 in | Wt 192.2 lb

## 2021-04-30 DIAGNOSIS — E113513 Type 2 diabetes mellitus with proliferative diabetic retinopathy with macular edema, bilateral: Secondary | ICD-10-CM

## 2021-04-30 DIAGNOSIS — Z794 Long term (current) use of insulin: Secondary | ICD-10-CM | POA: Diagnosis not present

## 2021-04-30 DIAGNOSIS — E118 Type 2 diabetes mellitus with unspecified complications: Secondary | ICD-10-CM

## 2021-04-30 DIAGNOSIS — R600 Localized edema: Secondary | ICD-10-CM | POA: Insufficient documentation

## 2021-04-30 DIAGNOSIS — K7469 Other cirrhosis of liver: Secondary | ICD-10-CM

## 2021-04-30 LAB — POCT GLYCOSYLATED HEMOGLOBIN (HGB A1C): Hemoglobin A1C: 9.4 % — AB (ref 4.0–5.6)

## 2021-04-30 NOTE — Telephone Encounter (Signed)
Actually we found her ozempic in the refrigerator and gave to patient.  I did ask her to call Mendel Ryder for f/u however.

## 2021-04-30 NOTE — Assessment & Plan Note (Addendum)
Regularly sees eye doctor, s/p recent eye surgeries.

## 2021-04-30 NOTE — Progress Notes (Signed)
Patient ID: Karen [name], female    DOB: 11/17/1955, 65 y.o.   MRN: [id number]  This visit was conducted in person.  BP 118/64    Pulse 66    Temp (!) 97.5 F (36.4 C) (Temporal)    Ht 5' (1.524 m)    Wt 192 lb 3 oz (87.2 kg)    SpO2 95%    BMI 37.53 kg/m    CC: DM f/u visit  Subjective:   HPI: Karen [name] is a 65 y.o. female presenting on 04/30/2021 for Diabetes (Here for 3 mo f/u.)   5 late cancellations in the past month.  14 lb weight gain in the past few months. She attributes partly to poor diet.   Hepatic cirrhosis - undergoing hepatitis A/B vaccination series through GI. It seems she missed 01/2021 appt with Dr Enis Gash and has cancelled several in interim, also cancelled liver biopsy scheduled for 12/220. # provided to call and reschedule.   Notes some pedal edema despite lasix 20mg  daily, BID PRN, spironolactone 12.5mg  and potassium 85mEq with lasix.  DM - does regularly check sugars has continuous glucose montior. Sugars have been running high - due to poor diet and stressors (stressed due to SO she's currently seeing). Compliant with antihyperglycemic regimen which includes: metformin XR 750mg  2 tab nightly, ozempic 1mg  weekly (ran out several months ago), tresiba 100u daily, novolin R 25u TID + SSI (5u for every 50 over 200). Denies low sugars or hypoglycemic symptoms. + paresthesias to fingers and feet, blurry vision. Last diabetic eye exam: 08/2020, since has had lens implant . Glucometer brand: FreeStyle Libre 2. Last foot exam: 01/2021 with podiatry. DSME: has not completed - declines. Lab Results  Component Value Date   HGBA1C 9.4 (A) 04/30/2021   Diabetic Foot Exam - Simple   No data filed    Lab Results  Component Value Date   MICROALBUR 3.3 (H) 07/02/2015         Relevant past medical, surgical, family and social history reviewed and updated as indicated. Interim medical history since our last visit reviewed. Allergies and medications reviewed and  updated. Outpatient Medications Prior to Visit  Medication Sig Dispense Refill   acetaminophen (TYLENOL) 500 MG tablet Take 1,000 mg by mouth 3 (three) times daily as needed for mild pain.     aspirin EC 81 MG EC tablet Take 1 tablet (81 mg total) by mouth daily.     Continuous Blood Gluc Receiver (FREESTYLE LIBRE 2 READER) DEVI Use as directed to check sugars 3-4 times daily 1 each 0   Continuous Blood Gluc Sensor (FREESTYLE LIBRE 2 SENSOR) MISC Use as directed to check sugars 3-4 times daily 6 each 3   dicyclomine (BENTYL) 20 MG tablet Take 1 tablet (20 mg total) by mouth 2 (two) times daily as needed for spasms. (Patient taking differently: Take 20 mg by mouth as needed for spasms.) 30 tablet 3   furosemide (LASIX) 20 MG tablet Take 1 tablet (20 mg total) by mouth daily as needed for edema. 30 tablet 11   gabapentin (NEURONTIN) 300 MG capsule TAKE ONE CAPSULE BY MOUTH AT BEDTIME 90 capsule 0   insulin degludec (TRESIBA FLEXTOUCH) 200 UNIT/ML FlexTouch Pen Inject 100 Units into the skin daily. 18 mL 11   Insulin Pen Needle (PEN NEEDLES) 31G X 8 MM MISC Use to inject Lantus as directed twice daily. Dx: E11.40 100 each 3   insulin regular (NOVOLIN R) 100 units/mL injection Inject 0.25  mLs (25 Units total) into the skin 3 (three) times daily before meals. + sliding scale insulin 5u for every 50 over 200 10 mL 6   KLOR-CON SPRINKLE 10 MEQ CR capsule Take 1 capsule (10 mEq total) by mouth daily as needed. When taking Lasix 30 capsule 11   losartan (COZAAR) 25 MG tablet Take 1 tablet (25 mg total) by mouth daily. 90 tablet 3   metFORMIN (GLUCOPHAGE-XR) 750 MG 24 hr tablet Take 2 tablets (1,500 mg total) by mouth every evening. 180 tablet 2   mupirocin ointment (BACTROBAN) 2 % Apply 1 application topically 2 (two) times daily. 22 g 0   nitroGLYCERIN (NITROSTAT) 0.4 MG SL tablet Place 1 tablet (0.4 mg total) under the tongue every 5 (five) minutes as needed for chest pain. 25 tablet 0   omeprazole  (PRILOSEC) 40 MG capsule Take 1 capsule (40 mg total) by mouth 2 (two) times daily. 180 capsule 3   polyethylene glycol (MIRALAX / GLYCOLAX) 17 g packet Take 17 g by mouth as needed.     propranolol (INDERAL) 20 MG tablet Take 1 tablet (20 mg total) by mouth 2 (two) times daily. 180 tablet 3   rosuvastatin (CRESTOR) 10 MG tablet Take 1 tablet (10 mg total) by mouth daily. 90 tablet 3   Semaglutide, 1 MG/DOSE, 4 MG/3ML SOPN Inject 1 mg as directed once a week. 3 mL 11   spironolactone (ALDACTONE) 25 MG tablet TAKE ONE-HALF TABLET DAILY 45 tablet 3   traMADol (ULTRAM) 50 MG tablet Take 1 tablet (50 mg total) by mouth daily as needed for moderate pain. 30 tablet 0   venlafaxine XR (EFFEXOR XR) 37.5 MG 24 hr capsule Take 1 capsule (37.5 mg total) by mouth daily with breakfast. 30 capsule 3   No facility-administered medications prior to visit.     Per HPI unless specifically indicated in ROS section below Review of Systems  Objective:  BP 118/64    Pulse 66    Temp (!) 97.5 F (36.4 C) (Temporal)    Ht 5' (1.524 m)    Wt 192 lb 3 oz (87.2 kg)    SpO2 95%    BMI 37.53 kg/m   Wt Readings from Last 3 Encounters:  04/30/21 192 lb 3 oz (87.2 kg)  01/15/21 178 lb 8 oz (81 kg)  12/28/20 179 lb (81.2 kg)      Physical Exam Vitals and nursing note reviewed.  Constitutional:      Appearance: Normal appearance. She is not ill-appearing.  Eyes:     Extraocular Movements: Extraocular movements intact.     Conjunctiva/sclera: Conjunctivae normal.     Pupils: Pupils are equal, round, and reactive to light.  Cardiovascular:     Rate and Rhythm: Normal rate and regular rhythm.     Pulses: Normal pulses.     Heart sounds: Normal heart sounds. No murmur heard. Pulmonary:     Effort: Pulmonary effort is normal. No respiratory distress.     Breath sounds: Normal breath sounds. No wheezing, rhonchi or rales.  Musculoskeletal:     Right lower leg: Edema (1+) present.     Left lower leg: Edema (1+)  present.  Skin:    General: Skin is warm and dry.     Findings: No rash.  Neurological:     Mental Status: She is alert.  Psychiatric:        Mood and Affect: Mood normal.        Behavior: Behavior normal.  Results for orders placed or performed in visit on 04/30/21  POCT glycosylated hemoglobin (Hb A1C)  Result Value Ref Range   Hemoglobin A1C 9.4 (A) 4.0 - 5.6 %   HbA1c POC (<> result, manual entry)     HbA1c, POC (prediabetic range)     HbA1c, POC (controlled diabetic range)      Assessment & Plan:  This visit occurred during the SARS-CoV-2 public health emergency.  Safety protocols were in place, including screening questions prior to the visit, additional usage of staff PPE, and extensive cleaning of exam room while observing appropriate contact time as indicated for disinfecting solutions.   Problem List Items Addressed This Visit     Diabetes mellitus type 2 with complications ([place]) - Primary    Chronic, deteriorated. She has been out of ozempic for months.  We do have ozempic for patient, 4 boxes provided today (should be a 4 month supply).  No documentation of when it was received - will forward to my Environmental education officer.  Recommend she call and schedule diabetes education classes as she's never completed      Relevant Orders   POCT glycosylated hemoglobin (Hb A1C) (Completed)   Comprehensive metabolic panel   CBC with Differential/Platelet   Protime-INR   Type 2 diabetes mellitus with proliferative diabetic retinopathy with macular edema, bilateral ([place])    Regularly sees eye doctor, s/p recent eye surgeries.       Cirrhosis of liver ([place])    Overdue for GI f/u. Will check CMP, CBC, INR today.  # provided to call and schedule liver US and biopsy, # provided to schedule GI f/u afterwards.       Pedal edema    Noted today along with weight gain.  She has not been taking lasix daily -rec start daily for the next week, with 2nd dose if needed. Update with  effect.         No orders of the defined types were placed in this encounter.  Orders Placed This Encounter  Procedures   Comprehensive metabolic panel   CBC with Differential/Platelet   Protime-INR   POCT glycosylated hemoglobin (Hb A1C)     Patient Instructions  Please call radiology scheduling at [phone] to reschedule your liver biopsy. You may also call [facility] to reschedule appointment with Dr Havery Moros at [phone].   Call Mendel Ryder [phone] to touch base about ozempic.  Call to schedule diabetes classes.  Restart ozempic.  Take lasix daily due to leg swelling Return in 3 months for wellness visit/physical.   Follow up plan: Return in about 3 months (around 07/29/2021), or if symptoms worsen or fail to improve, for annual exam, prior fasting for blood work, medicare wellness visit.  [name], MD

## 2021-04-30 NOTE — Assessment & Plan Note (Signed)
Noted today along with weight gain.  She has not been taking lasix daily -rec start daily for the next week, with 2nd dose if needed. Update with effect.

## 2021-04-30 NOTE — Telephone Encounter (Signed)
I'm seeing pt today. She's been out of ozempic for several months, it's supposed to arrive through patient assistance but I don't know that we've received any ozempic - will forward to [place] for an update.

## 2021-04-30 NOTE — Assessment & Plan Note (Signed)
Overdue for GI f/u. Will check CMP, CBC, INR today.  # provided to call and schedule liver US and biopsy, # provided to schedule GI f/u afterwards.

## 2021-04-30 NOTE — Patient Instructions (Addendum)
Please call radiology scheduling at [phone] to reschedule your liver biopsy. You may also call [facility] to reschedule appointment with Dr Havery Moros at [phone].   Call Mendel Ryder [phone] to touch base about ozempic.  Call to schedule diabetes classes.  Restart ozempic.  Take lasix daily due to leg swelling Return in 3 months for wellness visit/physical.

## 2021-04-30 NOTE — Assessment & Plan Note (Addendum)
Chronic, deteriorated. She has been out of ozempic for months.  We do have ozempic for patient, 4 boxes provided today (should be a 4 month supply).  No documentation of when it was received - will forward to my Environmental education officer.  Recommend she call and schedule diabetes education classes as she's never completed

## 2021-04-30 NOTE — Telephone Encounter (Addendum)
Received shipment today from [employer] for pt's Ozempic 1 mg inj (2 boxes total).   Notified pt via MyChart of delivery.   [Placed med in refrigerator (1st refrigerator, 3rd shelf).  Sent shipping invoice to be scanned.]

## 2021-05-02 ENCOUNTER — Encounter: Payer: Self-pay | Admitting: Family Medicine

## 2021-05-03 ENCOUNTER — Telehealth: Payer: Self-pay | Admitting: Radiology

## 2021-05-03 MED ORDER — PROMETHAZINE HCL 25 MG PO TABS: 12.5000 mg | ORAL_TABLET | Freq: Two times a day (BID) | ORAL | 0 refills | Status: DC | PRN

## 2021-05-03 NOTE — Addendum Note (Signed)
Addended by: Ellamae Sia on: 05/03/2021 08:07 AM   Modules accepted: Orders

## 2021-05-03 NOTE — Telephone Encounter (Signed)
Is there any way to get patient a pen of 0.25/0.5mg  dose (ozempic) for taper as she's been off ozempic for months and having trouble restarting with 1mg  dose which is what she has through patient assistance. She cannot afford a pen through the pharmacy. Thank you!

## 2021-05-03 NOTE — Telephone Encounter (Signed)
I believe we could complete the [employer] refill form and change her to the lower dose pen. Mendel Ryder, do you know of any other options?

## 2021-05-03 NOTE — Telephone Encounter (Signed)
She should be able to just dial the pen up half-way for 0.5 mg - that would be easier than trying to get a new pen for her, with all the supply issues [employer] has been having. I don't think any other dose is labeled on the pen itself besides 1 mg, but there should be notches - I believe it would be 36 notches for 0.5 mg, 18 notches for 0.25 mg. I can go through this with her.

## 2021-05-03 NOTE — Telephone Encounter (Signed)
I feel like she just needs 1-2 months/pens of lower dose to titrate more tolerably. She previously was on 1mg  dose and I believe doing well.

## 2021-05-03 NOTE — Telephone Encounter (Signed)
Patient was seen Friday, 12.16.22, left without having lab work done. LVM for patient to come in for lab work

## 2021-05-03 NOTE — Telephone Encounter (Signed)
See other mychart message.

## 2021-05-03 NOTE — Addendum Note (Signed)
Addended by: [name] on: 05/03/2021 11:07 AM   Modules accepted: Orders

## 2021-05-04 NOTE — Telephone Encounter (Signed)
I think that's a great option - that would be awesome if you could review with her?  Thank you!

## 2021-05-15 ENCOUNTER — Encounter: Payer: Self-pay | Admitting: Family Medicine

## 2021-05-17 NOTE — Telephone Encounter (Signed)
Pt tolerating ozempic 0.25mg  weekly - will increase to 0.5mg  weekly dose.  Fyi to [place].

## 2021-05-19 ENCOUNTER — Other Ambulatory Visit: Payer: Self-pay | Admitting: Physician Assistant

## 2021-05-21 ENCOUNTER — Telehealth: Payer: HMO

## 2021-05-24 ENCOUNTER — Telehealth: Payer: Self-pay

## 2021-05-24 DIAGNOSIS — S83412A Sprain of medial collateral ligament of left knee, initial encounter: Secondary | ICD-10-CM | POA: Diagnosis not present

## 2021-05-24 NOTE — Progress Notes (Signed)
Contacted the patient. Dr.G has increased the Ozempic  to 0.5mg . The patient started this past  Friday.She states she is tolerating this dose well. Her BG's have been:  05/24/21  @ 9:00am -116             05/23/21     10:00am- [place] CPP notified  Avel Sensor, [place] Assistant [phone]  Total time spent for month CPA: 20 min.

## 2021-05-24 NOTE — Telephone Encounter (Signed)
-----   Message from Charlton Haws, [facility] sent at 05/06/2021  4:26 PM EST ----- Regarding: Call patient Please call patient and see how she is doing with Ozempic - I sent her a mychart message on how to lower dose of Ozempic using the 1 mg pen on 12/22. She should be injecting 0.25 mg weekly.   You may have better luck reaching her by text.

## 2021-06-02 ENCOUNTER — Encounter: Payer: Self-pay | Admitting: Gastroenterology

## 2021-06-08 ENCOUNTER — Encounter: Payer: Self-pay | Admitting: Family Medicine

## 2021-06-08 ENCOUNTER — Telehealth: Payer: Self-pay

## 2021-06-08 NOTE — Progress Notes (Signed)
° ° °  Chronic Care Management Pharmacy Assistant   Name: DAVAN HARK  MRN: [id number] DOB: Oct 02, 1955  Reason for Encounter: CCM (Ozempic 2023 Approval)   Patient has been approved for Ozempic through [employer] for the year 2023. Assistance will end on 04/14/2022.  [name], CPP notified  Marijean Niemann, [place] Clinical Pharmacy Assistant [phone]

## 2021-06-10 ENCOUNTER — Telehealth: Payer: Self-pay

## 2021-06-10 NOTE — Telephone Encounter (Signed)
Called and Lm for patient to call back to get scheduled for Hep Injection- nurse visit.  She has only had her 1st Twinrix in August 2022. Per Dr. Havery Moros he would like her to have Hep B (2 pediatric doses) and 1 Hep A dose.  Orders are in

## 2021-06-10 NOTE — Telephone Encounter (Signed)
-----   Message from Yetta Flock, MD sent at 06/10/2021  3:36 PM EST ----- Regarding: RE: incomplete Twinrix series Maybe just give second dose separately?? ----- Message ----- From: Roetta Sessions, CMA Sent: 06/10/2021  12:55 PM EST To: Yetta Flock, MD Subject: incomplete Twinrix series                      Patient had 1st Twinrix in August of 2022. I can't find that she had the 2nd injection in September. ? At this point, should we start over and do Hep A and Hep B separate since we can't get Twinrix anymore?  Please advise. Thanks, Jan

## 2021-06-17 NOTE — Telephone Encounter (Signed)
Patient called to schedule for Hep Injection. Patient was scheduled for 2/17 at 11.

## 2021-06-19 ENCOUNTER — Other Ambulatory Visit: Payer: Self-pay | Admitting: Family Medicine

## 2021-06-29 ENCOUNTER — Other Ambulatory Visit: Payer: Self-pay | Admitting: Family Medicine

## 2021-07-05 ENCOUNTER — Ambulatory Visit (INDEPENDENT_AMBULATORY_CARE_PROVIDER_SITE_OTHER): Payer: HMO | Admitting: Gastroenterology

## 2021-07-05 DIAGNOSIS — Z23 Encounter for immunization: Secondary | ICD-10-CM | POA: Diagnosis not present

## 2021-07-06 ENCOUNTER — Telehealth: Payer: Self-pay

## 2021-07-06 ENCOUNTER — Encounter: Payer: Self-pay | Admitting: Podiatry

## 2021-07-06 ENCOUNTER — Other Ambulatory Visit: Payer: Self-pay

## 2021-07-06 ENCOUNTER — Ambulatory Visit (INDEPENDENT_AMBULATORY_CARE_PROVIDER_SITE_OTHER): Payer: HMO | Admitting: Podiatry

## 2021-07-06 DIAGNOSIS — L84 Corns and callosities: Secondary | ICD-10-CM | POA: Diagnosis not present

## 2021-07-06 DIAGNOSIS — E1142 Type 2 diabetes mellitus with diabetic polyneuropathy: Secondary | ICD-10-CM

## 2021-07-06 DIAGNOSIS — M2041 Other hammer toe(s) (acquired), right foot: Secondary | ICD-10-CM | POA: Diagnosis not present

## 2021-07-06 DIAGNOSIS — M2011 Hallux valgus (acquired), right foot: Secondary | ICD-10-CM

## 2021-07-06 DIAGNOSIS — B351 Tinea unguium: Secondary | ICD-10-CM

## 2021-07-06 DIAGNOSIS — M2012 Hallux valgus (acquired), left foot: Secondary | ICD-10-CM

## 2021-07-06 DIAGNOSIS — M79676 Pain in unspecified toe(s): Secondary | ICD-10-CM | POA: Diagnosis not present

## 2021-07-06 DIAGNOSIS — M2042 Other hammer toe(s) (acquired), left foot: Secondary | ICD-10-CM | POA: Diagnosis not present

## 2021-07-06 NOTE — Telephone Encounter (Signed)
-----   Message from Yetta Flock, MD sent at 07/06/2021  7:51 AM EST ----- Regarding: RE: advise She really doesn't need to wait long at all, if she wants to do it in 1-2 weeks that is fine.  ----- Message ----- From: Roetta Sessions, CMA Sent: 07/05/2021  11:26 AM EST To: Yetta Flock, MD Subject: advise                                         Dr. Havery Moros,  patient would like to know how long she should wait after getting a Hep A and Hep B vaccine to get her Covid Booster.  Thanks, Jan

## 2021-07-06 NOTE — Telephone Encounter (Signed)
Called patient and let her know Dr. Doyne Keel recommendations

## 2021-07-06 NOTE — Telephone Encounter (Signed)
Received shipment of Ozempic 1 mg (4 bxs total) from [employer], order # [id number].  [Placed shipment in 4th refrigerator- near break room door, 4th shelf.]  Pt also still has previous shipment (2 bxs total) in refrigerator.  Notified pt, via MyChart, that both shipments are ready for pick up.

## 2021-07-12 NOTE — Progress Notes (Signed)
°  Subjective:  Patient ID: Karen [name], female    DOB: 16-Dec-1955,  MRN: [id number]  SARIN COMUNALE presents to clinic today for at risk foot care with history of diabetic neuropathy and painful elongated mycotic toenails 1-5 bilaterally which are tender when wearing enclosed shoe gear. Pain is relieved with periodic professional debridement.  Patient relates 30 year h/o diabetes. She denies any h/o diabetic wounds. Patient states blood glucose was 91 mg/dl today.    New problem(s): None.   PCP is [name], MD , and last visit was April 30, 2021.  Allergies  Allergen Reactions   Clindamycin/Lincomycin Nausea And Vomiting and Other (See Comments)    Pt refuses to retry   Erythromycin Other (See Comments)    Stomach cramps   Lipitor [Atorvastatin] Nausea Only   Penicillins Rash and Other (See Comments)    Has patient had a PCN reaction causing immediate rash, facial/tongue/throat swelling, SOB or lightheadedness with hypotension: Yes Has patient had a PCN reaction causing severe rash involving mucus membranes or skin necrosis: No Has patient had a PCN reaction that required hospitalization No Has patient had a PCN reaction occurring within the last 10 years: No If all of the above answers are "NO", then may proceed with Cephalosporin use.      Review of Systems: Negative except as noted in the HPI. Objective:   Constitutional BLEU MOISAN is a pleasant 66 y.o. Caucasian female, in NAD. AAO x 3.   Vascular CFT immediate b/l LE. Faintly palpable DP/PT pulses b/l LE. Digital hair absent b/l. Skin temperature gradient WNL b/l. No pain with calf compression b/l. No edema noted b/l. No cyanosis or clubbing noted b/l LE.  Neurologic Normal speech. Oriented to person, place, and time. Pt has subjective symptoms of neuropathy. Protective sensation decreased with 10 gram monofilament b/l.  Dermatologic Pedal integument with normal turgor, texture and tone b/l LE. No open wounds  b/l. No interdigital macerations b/l. Toenails 1-5 b/l elongated, thickened, discolored with subungual debris. +Tenderness with dorsal palpation of nailplates. Hyperkeratotic lesion(s) noted submet head 5 left foot.  Orthopedic: Muscle strength 5/5 to all lower extremity muscle groups bilaterally. Mild hammertoe deformity 2-5 bilaterally. HAV with bunion deformity noted b/l LE.   Radiographs: None  Last A1c:  Hemoglobin A1C Latest Ref Rng & Units 04/30/2021 12/28/2020 09/11/2020  HGBA1C 4.0 - 5.6 % 9.4(A) 8.1(A) 8.1(A)  Some recent data might be hidden   Assessment:   1. Pain due to onychomycosis of toenail   2. Callus   3. Hallux valgus, acquired, bilateral   4. Acquired hammertoes of both feet   5. Diabetic polyneuropathy associated with type 2 diabetes mellitus ([place])    Plan:  Patient was evaluated and treated and all questions answered. Consent given for treatment as described below: -We discussed diabetic shoe program and she is interested in utilizing this benefit. Will forward chart to diabetic shoe department as prior authorization may be needed in order for her to obtain her diabetic shoes. -Patient to continue soft, supportive shoe gear daily. Start procedure for diabetic shoes. Patient qualifies based on diagnoses. -Mycotic toenails 1-5 bilaterally were debrided in length and girth with sterile nail nippers and dremel without incident. -Callus(es) submet head 5 left foot pared utilizing sterile scalpel blade without complication or incident. Total number debrided =1. -Patient/POA to call should there be question/concern in the interim.  Return in about 3 months (around 10/03/2021).  Marzetta Board, DPM

## 2021-07-16 ENCOUNTER — Encounter: Payer: Self-pay | Admitting: Family Medicine

## 2021-07-16 ENCOUNTER — Ambulatory Visit: Payer: HMO | Admitting: Nurse Practitioner

## 2021-07-19 NOTE — Telephone Encounter (Signed)
Lvm asking pt to call back.  Need to get update on sxs and relay Dr. Synthia Innocent message.   ? ?I also sent a MyChart message.  ?

## 2021-07-19 NOTE — Telephone Encounter (Signed)
Plz call for an update on cold symptoms.  ?She may take OTC coricidin HBP for cold symptoms and/or antihistamine like allegra/claritin for runny nose.  ?

## 2021-07-20 ENCOUNTER — Other Ambulatory Visit: Payer: Self-pay

## 2021-07-20 ENCOUNTER — Telehealth: Payer: HMO | Admitting: Family

## 2021-07-28 ENCOUNTER — Telehealth: Payer: Self-pay | Admitting: Family Medicine

## 2021-07-28 NOTE — Telephone Encounter (Signed)
Heather with [employer] called stating that she hasn't been able to get in contact with pt since September. ?

## 2021-07-29 NOTE — Telephone Encounter (Signed)
Attempted to contact pt.  No answer.  No vm.  Need to relay message from [place]. ? ?I sent Heather's message to pt via MyChart.  ?

## 2021-08-05 ENCOUNTER — Other Ambulatory Visit: Payer: Self-pay | Admitting: Family Medicine

## 2021-08-08 ENCOUNTER — Other Ambulatory Visit: Payer: Self-pay | Admitting: Family Medicine

## 2021-08-08 DIAGNOSIS — K7469 Other cirrhosis of liver: Secondary | ICD-10-CM

## 2021-08-08 DIAGNOSIS — E1169 Type 2 diabetes mellitus with other specified complication: Secondary | ICD-10-CM

## 2021-08-08 DIAGNOSIS — E118 Type 2 diabetes mellitus with unspecified complications: Secondary | ICD-10-CM

## 2021-08-09 ENCOUNTER — Other Ambulatory Visit: Payer: HMO

## 2021-08-11 ENCOUNTER — Other Ambulatory Visit: Payer: Self-pay

## 2021-08-11 ENCOUNTER — Ambulatory Visit: Payer: HMO

## 2021-08-12 ENCOUNTER — Other Ambulatory Visit: Payer: Self-pay | Admitting: Family Medicine

## 2021-08-12 ENCOUNTER — Other Ambulatory Visit (INDEPENDENT_AMBULATORY_CARE_PROVIDER_SITE_OTHER): Payer: HMO

## 2021-08-12 ENCOUNTER — Other Ambulatory Visit: Payer: HMO

## 2021-08-12 ENCOUNTER — Encounter: Payer: Self-pay | Admitting: Family Medicine

## 2021-08-12 DIAGNOSIS — E1169 Type 2 diabetes mellitus with other specified complication: Secondary | ICD-10-CM

## 2021-08-12 DIAGNOSIS — E118 Type 2 diabetes mellitus with unspecified complications: Secondary | ICD-10-CM

## 2021-08-12 DIAGNOSIS — K7469 Other cirrhosis of liver: Secondary | ICD-10-CM | POA: Diagnosis not present

## 2021-08-12 DIAGNOSIS — E785 Hyperlipidemia, unspecified: Secondary | ICD-10-CM

## 2021-08-12 LAB — CBC WITH DIFFERENTIAL/PLATELET
Basophils Absolute: 0 10*3/uL (ref 0.0–0.1)
Basophils Relative: 0.8 % (ref 0.0–3.0)
Eosinophils Absolute: 0.1 10*3/uL (ref 0.0–0.7)
Eosinophils Relative: 2.1 % (ref 0.0–5.0)
HCT: 37.7 % (ref 36.0–46.0)
Hemoglobin: 12.2 g/dL (ref 12.0–15.0)
Lymphocytes Relative: 33.3 % (ref 12.0–46.0)
Lymphs Abs: 0.9 10*3/uL (ref 0.7–4.0)
MCHC: 32.3 g/dL (ref 30.0–36.0)
MCV: 87.8 fl (ref 78.0–100.0)
Monocytes Absolute: 0.2 10*3/uL (ref 0.1–1.0)
Monocytes Relative: 8.3 % (ref 3.0–12.0)
Neutro Abs: 1.5 10*3/uL (ref 1.4–7.7)
Neutrophils Relative %: 55.5 % (ref 43.0–77.0)
Platelets: 110 10*3/uL — ABNORMAL LOW (ref 150.0–400.0)
RBC: 4.29 Mil/uL (ref 3.87–5.11)
RDW: 16.3 % — ABNORMAL HIGH (ref 11.5–15.5)
WBC: 2.7 10*3/uL — ABNORMAL LOW (ref 4.0–10.5)

## 2021-08-12 LAB — COMPREHENSIVE METABOLIC PANEL
ALT: 23 U/L (ref 0–35)
AST: 29 U/L (ref 0–37)
Albumin: 4 g/dL (ref 3.5–5.2)
Alkaline Phosphatase: 102 U/L (ref 39–117)
BUN: 15 mg/dL (ref 6–23)
CO2: 29 mEq/L (ref 19–32)
Calcium: 9.8 mg/dL (ref 8.4–10.5)
Chloride: 103 mEq/L (ref 96–112)
Creatinine, Ser: 0.75 mg/dL (ref 0.40–1.20)
GFR: 84.41 mL/min (ref >60.00)
Glucose, Bld: 248 mg/dL — ABNORMAL HIGH (ref 70–99)
Potassium: 4.9 mEq/L (ref 3.5–5.1)
Sodium: 138 mEq/L (ref 135–145)
Total Bilirubin: 0.6 mg/dL (ref 0.2–1.2)
Total Protein: 6.9 g/dL (ref 6.0–8.3)

## 2021-08-12 LAB — LIPID PANEL
Cholesterol: 127 mg/dL (ref 0–200)
HDL: 40.6 mg/dL (ref >39.00)
LDL Cholesterol: 47 mg/dL (ref 0–99)
NonHDL: 86.44
Total CHOL/HDL Ratio: 3
Triglycerides: 195 mg/dL — ABNORMAL HIGH (ref 0.0–149.0)
VLDL: 39 mg/dL (ref 0.0–40.0)

## 2021-08-12 LAB — MICROALBUMIN / CREATININE URINE RATIO
Creatinine,U: 79.8 mg/dL
Microalb Creat Ratio: 5.1 mg/g (ref 0.0–30.0)
Microalb, Ur: 4.1 mg/dL — ABNORMAL HIGH (ref 0.0–1.9)

## 2021-08-12 LAB — PROTIME-INR
INR: 1 ratio (ref 0.8–1.0)
Prothrombin Time: 11.5 s (ref 9.6–13.1)

## 2021-08-12 LAB — HEMOGLOBIN A1C: Hgb A1c MFr Bld: 8.5 % — ABNORMAL HIGH (ref 4.6–6.5)

## 2021-08-12 MED ORDER — FREESTYLE LIBRE 2 SENSOR MISC: 3 refills | Status: DC

## 2021-08-12 NOTE — Telephone Encounter (Signed)
Propanolol ?Last rx:  07/31/20, #180 ?Last OV: 04/30/21, 3 mo DM f/u ?Next OV:  08/16/21, AWV ?

## 2021-08-16 ENCOUNTER — Ambulatory Visit (INDEPENDENT_AMBULATORY_CARE_PROVIDER_SITE_OTHER): Payer: HMO | Admitting: Family Medicine

## 2021-08-16 ENCOUNTER — Encounter: Payer: Self-pay | Admitting: Family Medicine

## 2021-08-16 VITALS — BP 128/82 | HR 74 | Temp 97.3°F | Ht 61.0 in | Wt 179.0 lb

## 2021-08-16 DIAGNOSIS — I358 Other nonrheumatic aortic valve disorders: Secondary | ICD-10-CM

## 2021-08-16 DIAGNOSIS — I2510 Atherosclerotic heart disease of native coronary artery without angina pectoris: Secondary | ICD-10-CM

## 2021-08-16 DIAGNOSIS — D72819 Decreased white blood cell count, unspecified: Secondary | ICD-10-CM

## 2021-08-16 DIAGNOSIS — Z7189 Other specified counseling: Secondary | ICD-10-CM

## 2021-08-16 DIAGNOSIS — K219 Gastro-esophageal reflux disease without esophagitis: Secondary | ICD-10-CM

## 2021-08-16 DIAGNOSIS — E1169 Type 2 diabetes mellitus with other specified complication: Secondary | ICD-10-CM

## 2021-08-16 DIAGNOSIS — Z0000 Encounter for general adult medical examination without abnormal findings: Secondary | ICD-10-CM

## 2021-08-16 DIAGNOSIS — R0989 Other specified symptoms and signs involving the circulatory and respiratory systems: Secondary | ICD-10-CM

## 2021-08-16 DIAGNOSIS — F331 Major depressive disorder, recurrent, moderate: Secondary | ICD-10-CM

## 2021-08-16 DIAGNOSIS — K7469 Other cirrhosis of liver: Secondary | ICD-10-CM

## 2021-08-16 DIAGNOSIS — E113513 Type 2 diabetes mellitus with proliferative diabetic retinopathy with macular edema, bilateral: Secondary | ICD-10-CM

## 2021-08-16 DIAGNOSIS — D696 Thrombocytopenia, unspecified: Secondary | ICD-10-CM

## 2021-08-16 DIAGNOSIS — E118 Type 2 diabetes mellitus with unspecified complications: Secondary | ICD-10-CM

## 2021-08-16 DIAGNOSIS — I10 Essential (primary) hypertension: Secondary | ICD-10-CM

## 2021-08-16 LAB — CBC WITH DIFFERENTIAL/PLATELET
Basophils Absolute: 0 10*3/uL (ref 0.0–0.1)
Basophils Relative: 0.7 % (ref 0.0–3.0)
Eosinophils Absolute: 0.1 10*3/uL (ref 0.0–0.7)
Eosinophils Relative: 1.7 % (ref 0.0–5.0)
HCT: 37.7 % (ref 36.0–46.0)
Hemoglobin: 12.3 g/dL (ref 12.0–15.0)
Lymphocytes Relative: 33 % (ref 12.0–46.0)
Lymphs Abs: 1.1 10*3/uL (ref 0.7–4.0)
MCHC: 32.7 g/dL (ref 30.0–36.0)
MCV: 86.8 fl (ref 78.0–100.0)
Monocytes Absolute: 0.3 10*3/uL (ref 0.1–1.0)
Monocytes Relative: 9.2 % (ref 3.0–12.0)
Neutro Abs: 1.9 10*3/uL (ref 1.4–7.7)
Neutrophils Relative %: 55.4 % (ref 43.0–77.0)
Platelets: 98 10*3/uL — ABNORMAL LOW (ref 150.0–400.0)
RBC: 4.34 Mil/uL (ref 3.87–5.11)
RDW: 16 % — ABNORMAL HIGH (ref 11.5–15.5)
WBC: 3.4 10*3/uL — ABNORMAL LOW (ref 4.0–10.5)

## 2021-08-16 MED ORDER — DULOXETINE HCL 20 MG PO CPEP: 40.0000 mg | ORAL_CAPSULE | Freq: Every day | ORAL | 1 refills | Status: DC

## 2021-08-16 NOTE — Progress Notes (Signed)
? ? Patient ID: Karen [name], female    DOB: Jul 22, 1955, 66 y.o.   MRN: [id number] ? ?This visit was conducted in person. ? ?BP 128/82   Pulse 74   Temp (!) 97.3 ?F (36.3 ?C) (Temporal)   Ht '5\' 1"'$  (1.549 m)   Wt 179 lb (81.2 kg)   SpO2 97%   BMI 33.82 kg/m?   ? ?CC: initial AMW visit/CPE ?Subjective:  ? ?HPI: ?Karen [name] is a 66 y.o. female presenting on 08/16/2021 for Medicare Wellness ? ? ?Did not see health advisor this year.  ? ?Hearing Screening  ? '500Hz'$  '1000Hz'$  '2000Hz'$  '4000Hz'$   ?Right ear 0 0 0 0  ?Left ear '20 20 25 '$ 0  ?Comments: Heard practice tone on 40 dBHL in R ear.  Pt states she is deaf in R ear.  ? ?Vision Screening - Comments:: Last eye exam, 02/2021.  ?[place] Office Visit from 08/16/2021 in Karen [name] at [place]  ?PHQ-2 Total Score 1  ? ?  ?  ? ?  08/16/2021  ? 10:38 AM 07/10/2020  ?  3:00 PM 10/28/2019  ? 12:20 PM  ?Fall Risk   ?Falls in the past year? 1 0 0  ?Comment Tripped    ?Number falls in past yr: 0  0  ?Injury with Fall? 0  0  ?Follow up   Falls evaluation completed  ?Tripped on friend's porch while wearing sneakers.  ? ?DM - continues ozempic '1mg'$  weekly along with tresiba 100u daily, metformin XR '1500mg'$  daily, and novolin R TID AC per SSI. Notes some low sugars on this regimen.  ?Lab Results  ?Component Value Date  ? HGBA1C 8.5 (H) 08/12/2021  ? ?Notes ongoing difficulty with mood. She switched back from effexor to cymbalta '20mg'$  because effexor wasn't too effective either.  ? ?Preventative: ?COLONOSCOPY WITH ESOPHAGOGASTRODUODENOSCOPY 04/2019 - esophageal varices II, portal hypertensive gastritis, gastric polyp, H pylori neg, 12 TAs, rpt colonoscopy in 1 yr ([facility]) - planning to reschedule soon.  ?Well woman exam 01/2019 - all normal, neg HPV, told rpt 3 yrs  - will need to reschedule.  ?[facility]  ?Lung [name] screening - not eligible  ?Flu shot - yearly ?[place] 07/2019, 08/2019, booster 04/2020 ?Pneumovax-23 - 2015 ?Tdap -  2017 ?Shingrix - discussed  ?Advanced directive discussed: Discussed with patient - she does not have this at home. She's unsure who she would designate as HCPOA - possibly brother Karen [name]. Packet provided today.  ?Seat belt use discussed ?Sunscreen use discussed. No changing moles on skin.  ?Smoking - never ?Alcohol - none ?Dentist - seeing regularly undergoing dental work  ?Eye exam - yearly Karen [name] - seeing retinologist Karen [name] s/p lens to right eye ?Bowel - constipation - managed with miralax ?Bladder - mild stress incontinence  ?  ?Caffeine: 6 diet sodas/day ?Lives with 2 dogs. ?Occupation: was CNA - now on disability ?Activity: walking some ?Diet: some water, fruits/vegetables daily  ? ?   ? ?Relevant past medical, surgical, family and social history reviewed and updated as indicated. Interim medical history since our last visit reviewed. ?Allergies and medications reviewed and updated. ?Outpatient Medications Prior to Visit  ?Medication Sig Dispense Refill  ? acetaminophen (TYLENOL) 500 MG tablet Take 1,000 mg by mouth 3 (three) times daily as needed for mild pain.    ? aspirin EC 81 MG EC tablet Take 1 tablet (81 mg total) by mouth daily.    ? Continuous Blood  Gluc Receiver (FREESTYLE LIBRE 2 READER) DEVI Use as directed to check sugars 3-4 times daily 1 each 0  ? Continuous Blood Gluc Sensor (FREESTYLE LIBRE 2 SENSOR) MISC Use as directed to check blood sugars 3-4 times daily 6 each 3  ? dicyclomine (BENTYL) 20 MG tablet Take 1 tablet (20 mg total) by mouth 2 (two) times daily as needed for spasms. (Patient taking differently: Take 20 mg by mouth as needed for spasms.) 30 tablet 3  ? furosemide (LASIX) 20 MG tablet Take 1 tablet (20 mg total) by mouth daily as needed for edema. 30 tablet 11  ? gabapentin (NEURONTIN) 300 MG capsule TAKE ONE CAPSULE BY MOUTH AT BEDTIME 90 capsule 0  ? insulin degludec (TRESIBA FLEXTOUCH) 200 UNIT/ML FlexTouch Pen Inject 100 Units into the skin daily. 18 mL 11  ? Insulin  Pen Needle (PEN NEEDLES) 31G X 8 MM MISC Use to inject Lantus as directed twice daily. Dx: E11.40 100 each 3  ? KLOR-CON SPRINKLE 10 MEQ CR capsule Take 1 capsule (10 mEq total) by mouth daily as needed. When taking Lasix 30 capsule 11  ? losartan (COZAAR) 25 MG tablet Take 1 tablet (25 mg total) by mouth daily. 90 tablet 3  ? metFORMIN (GLUCOPHAGE-XR) 750 MG 24 hr tablet Take 2 tablets (1,500 mg total) by mouth every evening. 180 tablet 0  ? mupirocin ointment (BACTROBAN) 2 % Apply 1 application topically 2 (two) times daily. 22 g 0  ? nitroGLYCERIN (NITROSTAT) 0.4 MG SL tablet Place 1 tablet (0.4 mg total) under the tongue every 5 (five) minutes as needed for chest pain. 25 tablet 0  ? NOVOLIN R 100 UNIT/ML injection Inject 0.25 mLs (25 Units total) into the skin 3 (three) times daily before meals. + sliding scale insulin 5u for every 50 over 200 10 mL 6  ? omeprazole (PRILOSEC) 40 MG capsule Take 1 capsule (40 mg total) by mouth 2 (two) times daily. 180 capsule 3  ? PENTIPS 31G X 6 MM MISC Use to inject Lantus as directed twice daily. Dx E11.40    ? polyethylene glycol (MIRALAX / GLYCOLAX) 17 g packet Take 17 g by mouth as needed.    ? promethazine (PHENERGAN) 25 MG tablet Take 0.5-1 tablets (12.5-25 mg total) by mouth 2 (two) times daily as needed for nausea. 20 tablet 0  ? propranolol (INDERAL) 20 MG tablet TAKE ONE TABLET BY MOUTH TWICE DAILY 180 tablet 0  ? rosuvastatin (CRESTOR) 10 MG tablet Take 1 tablet (10 mg total) by mouth daily. 90 tablet 0  ? Semaglutide, 1 MG/DOSE, 4 MG/3ML SOPN Inject 1 mg as directed once a week. 3 mL 11  ? spironolactone (ALDACTONE) 25 MG tablet TAKE ONE-HALF TABLET DAILY 45 tablet 3  ? traMADol (ULTRAM) 50 MG tablet Take 1 tablet (50 mg total) by mouth daily as needed for moderate pain. 30 tablet 0  ? venlafaxine XR (EFFEXOR XR) 37.5 MG 24 hr capsule Take 1 capsule (37.5 mg total) by mouth daily with breakfast. 30 capsule 3  ? ?No facility-administered medications prior to visit.   ?  ? ?Per HPI unless specifically indicated in ROS section below ?Review of Systems  ?Constitutional:  Negative for activity change, appetite change, chills, fatigue, fever and unexpected weight change.  ?HENT:  Negative for hearing loss.   ?Eyes:  Negative for visual disturbance.  ?Respiratory:  Negative for cough, chest tightness, shortness of breath and wheezing.   ?Cardiovascular:  Positive for leg swelling (occ). Negative for  chest pain and palpitations.  ?Gastrointestinal:  Negative for abdominal distention, abdominal pain, blood in stool, constipation, diarrhea, nausea and vomiting.  ?Genitourinary:  Negative for difficulty urinating and hematuria.  ?Musculoskeletal:  Negative for arthralgias, myalgias and neck pain.  ?Skin:  Negative for rash.  ?Neurological:  Negative for dizziness, seizures, syncope and headaches.  ?Hematological:  Negative for adenopathy. Does not bruise/bleed easily.  ?Psychiatric/Behavioral:  Positive for dysphoric mood. The patient is nervous/anxious.   ? ?Objective:  ?BP 128/82   Pulse 74   Temp (!) 97.3 ?F (36.3 ?C) (Temporal)   Ht '5\' 1"'$  (1.549 m)   Wt 179 lb (81.2 kg)   SpO2 97%   BMI 33.82 kg/m?   ?Wt Readings from Last 3 Encounters:  ?08/16/21 179 lb (81.2 kg)  ?04/30/21 192 lb 3 oz (87.2 kg)  ?01/15/21 178 lb 8 oz (81 kg)  ?  ?  ?Physical Exam ?Vitals and nursing note reviewed.  ?Constitutional:   ?   Appearance: Normal appearance. She is not ill-appearing.  ?HENT:  ?   Head: Normocephalic and atraumatic.  ?   Right Ear: Tympanic membrane, ear canal and external ear normal. There is no impacted cerumen.  ?   Left Ear: Tympanic membrane, ear canal and external ear normal. There is no impacted cerumen.  ?Eyes:  ?   General:     ?   Right eye: No discharge.     ?   Left eye: No discharge.  ?   Extraocular Movements: Extraocular movements intact.  ?   Conjunctiva/sclera: Conjunctivae normal.  ?   Pupils: Pupils are equal, round, and reactive to light.  ?Neck:  ?   Thyroid: No  thyroid mass or thyromegaly.  ?   Vascular: Carotid bruit (R sided) present.  ?Cardiovascular:  ?   Rate and Rhythm: Normal rate and regular rhythm.  ?   Pulses: Normal pulses.  ?   Heart sounds: Normal heart sou

## 2021-08-16 NOTE — Telephone Encounter (Signed)
This was refilled last week

## 2021-08-16 NOTE — Patient Instructions (Addendum)
Call GI to schedule follow up as you're overdue [phone].  ?Call to schedule mammogram at your convenience: ?[facility] [phone] ?If interested, check with pharmacy about new 2 shot shingles series (shingrix).  ?Work on Scientist, physiological. Bring me copy when complete.  ?Continue current medicines except increase cymbalta to '40mg'$  daily - new dose at pharmacy.  ?Return in 3-4 months for diabetes follow up visit  ? ?Health Maintenance for Postmenopausal Women ?Menopause is a normal process in which your ability to get pregnant comes to an end. This process happens slowly over many months or years, usually between the ages of 34 and 89. Menopause is complete when you have missed your menstrual period for 12 months. ?It is important to talk with your health care provider about some of the most common conditions that affect women after menopause (postmenopausal women). These include heart disease, cancer, and bone loss (osteoporosis). Adopting a healthy lifestyle and getting preventive care can help to promote your health and wellness. The actions you take can also lower your chances of developing some of these common conditions. ?What are the signs and symptoms of menopause? ?During menopause, you may have the following symptoms: ?Hot flashes. These can be moderate or severe. ?Night sweats. ?Decrease in sex drive. ?Mood swings. ?Headaches. ?Tiredness (fatigue). ?Irritability. ?Memory problems. ?Problems falling asleep or staying asleep. ?Talk with your health care provider about treatment options for your symptoms. ?Do I need hormone replacement therapy? ?Hormone replacement therapy is effective in treating symptoms that are caused by menopause, such as hot flashes and night sweats. ?Hormone replacement carries certain risks, especially as you become older. If you are thinking about using estrogen or estrogen with progestin, discuss the benefits and risks with your health care provider. ?How  can I reduce my risk for heart disease and stroke? ?The risk of heart disease, heart attack, and stroke increases as you age. One of the causes may be a change in the body's hormones during menopause. This can affect how your body uses dietary fats, triglycerides, and cholesterol. Heart attack and stroke are medical emergencies. There are many things that you can do to help prevent heart disease and stroke. ?Watch your blood pressure ?High blood pressure causes heart disease and increases the risk of stroke. This is more likely to develop in people who have high blood pressure readings or are overweight. ?Have your blood pressure checked: ?Every 3-5 years if you are 73-2 years of age. ?Every year if you are 58 years old or older. ?Eat a healthy diet ? ?Eat a diet that includes plenty of vegetables, fruits, low-fat dairy products, and lean protein. ?Do not eat a lot of foods that are high in solid fats, added sugars, or sodium. ?Get regular exercise ?Get regular exercise. This is one of the most important things you can do for your health. Most adults should: ?Try to exercise for at least 150 minutes each week. The exercise should increase your heart rate and make you sweat (moderate-intensity exercise). ?Try to do strengthening exercises at least twice each week. Do these in addition to the moderate-intensity exercise. ?Spend less time sitting. Even light physical activity can be beneficial. ?Other tips ?Work with your health care provider to achieve or maintain a healthy weight. ?Do not use any products that contain nicotine or tobacco. These products include cigarettes, chewing tobacco, and vaping devices, such as e-cigarettes. If you need help quitting, ask your health care provider. ?Know your numbers. Ask your health care  provider to check your cholesterol and your blood sugar (glucose). Continue to have your blood tested as directed by your health care provider. ?Do I need screening for cancer? ?Depending on  your health history and family history, you may need to have cancer screenings at different stages of your life. This may include screening for: ?Breast cancer. ?Cervical cancer. ?Lung cancer. ?Colorectal cancer. ?What is my risk for osteoporosis? ?After menopause, you may be at increased risk for osteoporosis. Osteoporosis is a condition in which bone destruction happens more quickly than new bone creation. To help prevent osteoporosis or the bone fractures that can happen because of osteoporosis, you may take the following actions: ?If you are 52-92 years old, get at least 1,000 mg of calcium and at least 600 international units (IU) of vitamin D per day. ?If you are older than age 85 but younger than age 22, get at least 1,200 mg of calcium and at least 600 international units (IU) of vitamin D per day. ?If you are older than age 45, get at least 1,200 mg of calcium and at least 800 international units (IU) of vitamin D per day. ?Smoking and drinking excessive alcohol increase the risk of osteoporosis. Eat foods that are rich in calcium and vitamin D, and do weight-bearing exercises several times each week as directed by your health care provider. ?How does menopause affect my mental health? ?Depression may occur at any age, but it is more common as you become older. Common symptoms of depression include: ?Feeling depressed. ?Changes in sleep patterns. ?Changes in appetite or eating patterns. ?Feeling an overall lack of motivation or enjoyment of activities that you previously enjoyed. ?Frequent crying spells. ?Talk with your health care provider if you think that you are experiencing any of these symptoms. ?General instructions ?See your health care provider for regular wellness exams and vaccines. This may include: ?Scheduling regular health, dental, and eye exams. ?Getting and maintaining your vaccines. These include: ?Influenza vaccine. Get this vaccine each year before the flu season begins. ?Pneumonia  vaccine. ?Shingles vaccine. ?Tetanus, diphtheria, and pertussis (Tdap) booster vaccine. ?Your health care provider may also recommend other immunizations. ?Tell your health care provider if you have ever been abused or do not feel safe at home. ?Summary ?Menopause is a normal process in which your ability to get pregnant comes to an end. ?This condition causes hot flashes, night sweats, decreased interest in sex, mood swings, headaches, or lack of sleep. ?Treatment for this condition may include hormone replacement therapy. ?Take actions to keep yourself healthy, including exercising regularly, eating a healthy diet, watching your weight, and checking your blood pressure and blood sugar levels. ?Get screened for cancer and depression. Make sure that you are up to date with all your vaccines. ?This information is not intended to replace advice given to you by your health care provider. Make sure you discuss any questions you have with your health care provider. ?Document Revised: 09/21/2020 Document Reviewed: 09/21/2020 ?Elsevier Patient Education ? [place]. ? ?

## 2021-08-17 ENCOUNTER — Encounter: Payer: Self-pay | Admitting: Family Medicine

## 2021-08-17 ENCOUNTER — Telehealth: Payer: Self-pay | Admitting: Family Medicine

## 2021-08-17 DIAGNOSIS — D72819 Decreased white blood cell count, unspecified: Secondary | ICD-10-CM | POA: Insufficient documentation

## 2021-08-17 DIAGNOSIS — R0989 Other specified symptoms and signs involving the circulatory and respiratory systems: Secondary | ICD-10-CM | POA: Insufficient documentation

## 2021-08-17 DIAGNOSIS — I771 Stricture of artery: Secondary | ICD-10-CM | POA: Insufficient documentation

## 2021-08-17 LAB — PATHOLOGIST SMEAR REVIEW

## 2021-08-17 MED ORDER — VENLAFAXINE HCL ER 75 MG PO CP24: 75.0000 mg | ORAL_CAPSULE | Freq: Every day | ORAL | 11 refills | Status: DC

## 2021-08-17 NOTE — Assessment & Plan Note (Signed)
Continues omeprazole 40mg bid  

## 2021-08-17 NOTE — Assessment & Plan Note (Signed)
Chronic, stable on current regimen.  

## 2021-08-17 NOTE — Assessment & Plan Note (Signed)
Check carotid US. 

## 2021-08-17 NOTE — Telephone Encounter (Signed)
Plz call patient - given her liver disease, recommend we not increase cymbalta but instead go back to effexor but at higher dose.  ?I've sent effexor XR '75mg'$  daily to take in place of cymbalta '40mg'$  daily.  ?Update [place] with effect after 3-4 wks, we may continue increasing effexor dose as tolerated/needed. Effexor is more difficult to come off of so if she decides to stop it, needs to let us know to go over tapering schedule.  ?

## 2021-08-17 NOTE — Assessment & Plan Note (Signed)
Regularly seeing eye doctors Tommy Rainwater, [place]) ?

## 2021-08-17 NOTE — Assessment & Plan Note (Signed)
Chronic, improving but still uncontrolled. Now on ozempic '1mg'$  weekly, tresiba 100u daily metformin XR '1500mg'$  daily and novolin R TID AC per SSI. Reassess at 3-4 mo DM f\u visit  ?

## 2021-08-17 NOTE — Assessment & Plan Note (Signed)

## 2021-08-17 NOTE — Assessment & Plan Note (Signed)
Advanced directive discussed: Discussed with patient - she does not have this at home. She's unsure who she would designate as HCPOA - possibly brother Arliss Journey. Packet provided today.  ?

## 2021-08-17 NOTE — Assessment & Plan Note (Signed)
New. Update CBC, periph smear.  ?

## 2021-08-17 NOTE — Assessment & Plan Note (Signed)
Preventative protocols reviewed and updated unless pt declined. Discussed healthy diet and lifestyle.  

## 2021-08-17 NOTE — Assessment & Plan Note (Signed)
Chronic, continue crestor. Anticipate improved triglycerides as glycemic control improves.  ?The ASCVD Risk score (Arnett DK, et al., 2019) failed to calculate for the following reasons: ?  The valid total cholesterol range is 130 to 320 mg/dL  ?

## 2021-08-17 NOTE — Assessment & Plan Note (Signed)
Liver disease related.  

## 2021-08-17 NOTE — Telephone Encounter (Signed)
Lvm asking pt to call back.  Need to relay Dr. Synthia Innocent message.   ? ?I also sent a MyChart message. ?

## 2021-08-17 NOTE — Assessment & Plan Note (Signed)
H/o this by echo 2022.  ?

## 2021-08-17 NOTE — Assessment & Plan Note (Signed)
She switched off effexor and back onto cymbalta - didn't feel low dose effexor was beneficial.  ?Initially discussed increasing cymbalta to '40mg'$  daily however given liver disease do recommend trial higher effexor dose, update with effect. See telephone encounter.  ?

## 2021-08-17 NOTE — Assessment & Plan Note (Addendum)
Appreciate GI care.  ?May be overdue for f/u biopsy/imaging - rec call GI for f/u plan. ?

## 2021-08-23 ENCOUNTER — Other Ambulatory Visit: Payer: Self-pay | Admitting: Family Medicine

## 2021-08-31 ENCOUNTER — Inpatient Hospital Stay (HOSPITAL_COMMUNITY): Admission: RE | Admit: 2021-08-31 | Payer: HMO | Source: Ambulatory Visit

## 2021-09-01 ENCOUNTER — Ambulatory Visit (HOSPITAL_COMMUNITY): Payer: HMO

## 2021-09-15 ENCOUNTER — Ambulatory Visit (HOSPITAL_COMMUNITY): Payer: HMO

## 2021-09-21 ENCOUNTER — Other Ambulatory Visit (INDEPENDENT_AMBULATORY_CARE_PROVIDER_SITE_OTHER): Payer: HMO | Admitting: Podiatry

## 2021-09-21 DIAGNOSIS — M2042 Other hammer toe(s) (acquired), left foot: Secondary | ICD-10-CM

## 2021-09-21 DIAGNOSIS — M2041 Other hammer toe(s) (acquired), right foot: Secondary | ICD-10-CM

## 2021-09-21 DIAGNOSIS — M2011 Hallux valgus (acquired), right foot: Secondary | ICD-10-CM

## 2021-09-21 DIAGNOSIS — M2012 Hallux valgus (acquired), left foot: Secondary | ICD-10-CM

## 2021-09-21 DIAGNOSIS — L84 Corns and callosities: Secondary | ICD-10-CM

## 2021-09-21 DIAGNOSIS — E1142 Type 2 diabetes mellitus with diabetic polyneuropathy: Secondary | ICD-10-CM

## 2021-09-21 NOTE — Progress Notes (Signed)
Order entered for diabetic shoes: ?Dispense one pair extra depth shoes with 3 pair custom insoles. Offload submet head 5 left foot. ? ?

## 2021-09-23 ENCOUNTER — Ambulatory Visit (HOSPITAL_COMMUNITY): Payer: HMO

## 2021-09-24 ENCOUNTER — Telehealth: Payer: Self-pay

## 2021-09-24 NOTE — Telephone Encounter (Signed)
Received pt's shipment on 09/23/21 of Ozempic 1 mg (4 bxs total).  Notified pt via [place].  ? ?[Placed Ozempic in 3rd refrigerator, 2nd shelf.] ?

## 2021-09-26 ENCOUNTER — Emergency Department (HOSPITAL_COMMUNITY)
Admission: EM | Admit: 2021-09-26 | Discharge: 2021-09-26 | Disposition: A | Discharge status: Home / Self Care | Payer: HMO | Attending: Emergency Medicine | Admitting: Emergency Medicine

## 2021-09-26 ENCOUNTER — Encounter (HOSPITAL_COMMUNITY): Payer: Self-pay | Admitting: *Deleted

## 2021-09-26 ENCOUNTER — Other Ambulatory Visit: Payer: Self-pay

## 2021-09-26 ENCOUNTER — Emergency Department (HOSPITAL_COMMUNITY): Payer: HMO

## 2021-09-26 DIAGNOSIS — Z794 Long term (current) use of insulin: Secondary | ICD-10-CM | POA: Diagnosis not present

## 2021-09-26 DIAGNOSIS — Z7984 Long term (current) use of oral hypoglycemic drugs: Secondary | ICD-10-CM | POA: Insufficient documentation

## 2021-09-26 DIAGNOSIS — M546 Pain in thoracic spine: Secondary | ICD-10-CM | POA: Insufficient documentation

## 2021-09-26 DIAGNOSIS — E119 Type 2 diabetes mellitus without complications: Secondary | ICD-10-CM | POA: Insufficient documentation

## 2021-09-26 DIAGNOSIS — R519 Headache, unspecified: Secondary | ICD-10-CM | POA: Diagnosis not present

## 2021-09-26 DIAGNOSIS — I2510 Atherosclerotic heart disease of native coronary artery without angina pectoris: Secondary | ICD-10-CM | POA: Insufficient documentation

## 2021-09-26 DIAGNOSIS — M542 Cervicalgia: Secondary | ICD-10-CM | POA: Insufficient documentation

## 2021-09-26 DIAGNOSIS — Z79899 Other long term (current) drug therapy: Secondary | ICD-10-CM | POA: Insufficient documentation

## 2021-09-26 DIAGNOSIS — R0789 Other chest pain: Secondary | ICD-10-CM | POA: Insufficient documentation

## 2021-09-26 DIAGNOSIS — Z7982 Long term (current) use of aspirin: Secondary | ICD-10-CM | POA: Insufficient documentation

## 2021-09-26 LAB — CBG MONITORING, ED
Glucose-Capillary: 57 mg/dL — ABNORMAL LOW (ref 70–99)
Glucose-Capillary: 69 mg/dL — ABNORMAL LOW (ref 70–99)
Glucose-Capillary: 86 mg/dL (ref 70–99)
Glucose-Capillary: 112 mg/dL — ABNORMAL HIGH (ref 70–99)

## 2021-09-26 MED ORDER — LIDOCAINE 4 % EX PTCH: 1.0000 | MEDICATED_PATCH | Freq: Two times a day (BID) | CUTANEOUS | 0 refills | Status: DC | PRN

## 2021-09-26 MED ORDER — OXYCODONE HCL 5 MG PO TABS: 5.0000 mg | ORAL_TABLET | Freq: Four times a day (QID) | ORAL | 0 refills | Status: AC | PRN
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
## 2021-09-26 NOTE — ED Notes (Signed)
Patient transported to X-ray 
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
## 2021-09-26 NOTE — ED Notes (Addendum)
Pt blood sugar 57. Pt given one pack of graham crackers, peanut butter, and one cup orange juice with two sugar packs to improve blood sugar.  ?
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
## 2021-10-05 ENCOUNTER — Ambulatory Visit: Payer: HMO | Admitting: Podiatry

## 2021-10-06 ENCOUNTER — Other Ambulatory Visit: Payer: Self-pay | Admitting: Family Medicine

## 2021-10-12 ENCOUNTER — Encounter (HOSPITAL_COMMUNITY): Payer: HMO

## 2021-10-18 ENCOUNTER — Other Ambulatory Visit: Payer: Self-pay | Admitting: Family Medicine

## 2021-10-20 ENCOUNTER — Encounter (HOSPITAL_COMMUNITY): Payer: HMO

## 2021-10-21 ENCOUNTER — Ambulatory Visit (HOSPITAL_COMMUNITY)
Admission: RE | Admit: 2021-10-21 | Discharge: 2021-10-21 | Disposition: A | Discharge status: Home / Self Care | Payer: HMO | Source: Ambulatory Visit | Attending: Family Medicine | Admitting: Family Medicine

## 2021-10-21 DIAGNOSIS — R0989 Other specified symptoms and signs involving the circulatory and respiratory systems: Secondary | ICD-10-CM | POA: Insufficient documentation

## 2021-10-22 ENCOUNTER — Other Ambulatory Visit: Payer: Self-pay | Admitting: Family Medicine

## 2021-10-25 ENCOUNTER — Encounter: Payer: Self-pay | Admitting: Family Medicine

## 2021-10-26 ENCOUNTER — Ambulatory Visit: Payer: HMO

## 2021-10-26 ENCOUNTER — Ambulatory Visit: Payer: HMO | Admitting: Podiatry

## 2021-10-26 DIAGNOSIS — M2011 Hallux valgus (acquired), right foot: Secondary | ICD-10-CM

## 2021-10-26 DIAGNOSIS — M2041 Other hammer toe(s) (acquired), right foot: Secondary | ICD-10-CM

## 2021-10-26 DIAGNOSIS — E1142 Type 2 diabetes mellitus with diabetic polyneuropathy: Secondary | ICD-10-CM

## 2021-10-26 NOTE — Progress Notes (Signed)
SITUATION Reason for Consult: Evaluation for Prefabricated Diabetic Shoes and Custom Diabetic Inserts. Patient / Caregiver Report: Patient would like well fitting shoes  OBJECTIVE DATA: Patient History / Diagnosis:    ICD-10-CM   1. Diabetic polyneuropathy associated with type 2 diabetes mellitus (HCC)  E11.42     2. Hallux valgus, acquired, bilateral  M20.11    M20.12     3. Acquired hammertoes of both feet  M20.41    M20.42       Physician Treating Diabetes:  [name] md  Current or Previous Devices:   None and no history  In-Person Foot Examination: Ulcers & Callousing:   None Deformities:    Hammertoes, hallux valgus Sensation:    Compromised  Shoe Size:     8W  ORTHOTIC RECOMMENDATION Recommended Devices: - 1x pair prefabricated PDAC approved diabetic shoes; Patient Selected X801W Size 8W - 3x pair custom-to-patient PDAC approved vacuum formed diabetic insoles.  GOALS OF SHOES AND INSOLES - Reduce shear and pressure - Reduce / Prevent callus formation - Reduce / Prevent ulceration - Protect the fragile healing compromised diabetic foot.  Patient would benefit from diabetic shoes and inserts as patient has diabetes mellitus and the patient has one or more of the following conditions: - History of partial or complete amputation of the foot - History of previous foot ulceration. - History of pre-ulcerative callus - Peripheral neuropathy with evidence of callus formation - Foot deformity - Poor circulation  ACTIONS PERFORMED Potential out of pocket cost was communicated to patient. Patient understood and consented to measurement and casting. Patient was casted for insoles via crush box and measured for shoes via brannock device. Procedure was explained and patient tolerated procedure well. All questions were answered and concerns addressed. Casts were shipped to central fabrication. Shoes ordered.  PLAN Patient is to be contacted and scheduled for fitting  once shoes and insoles have been fabricated and received.

## 2021-10-27 ENCOUNTER — Other Ambulatory Visit: Payer: Self-pay | Admitting: Family Medicine

## 2021-10-28 NOTE — Telephone Encounter (Signed)
Tyler Aas Last filled:  09/27/21, #18 mL Last OV:  08/16/21, CPE Next OV:  11/15/21, DM f/u

## 2021-10-29 NOTE — Telephone Encounter (Signed)
ERx 

## 2021-11-03 ENCOUNTER — Telehealth: Payer: Self-pay | Admitting: Family Medicine

## 2021-11-03 NOTE — Telephone Encounter (Signed)
[facility] called and needed clinical info on venlafaxine for a PA, I let Lattie Haw know via teams and she said she would call them back at [phone] option 2

## 2021-11-05 NOTE — Telephone Encounter (Signed)
Received faxed PA approval for venlafaxine ER 75 mg cap, valid 11/04/2021- 05/15/2022.

## 2021-11-15 ENCOUNTER — Ambulatory Visit: Payer: HMO | Admitting: Family Medicine

## 2021-11-19 ENCOUNTER — Other Ambulatory Visit: Payer: Self-pay | Admitting: Family Medicine

## 2021-11-19 ENCOUNTER — Ambulatory Visit: Payer: HMO | Admitting: Family Medicine

## 2021-11-30 ENCOUNTER — Encounter: Payer: Self-pay | Admitting: Family Medicine

## 2021-11-30 ENCOUNTER — Ambulatory Visit (INDEPENDENT_AMBULATORY_CARE_PROVIDER_SITE_OTHER): Payer: PPO | Admitting: Family Medicine

## 2021-11-30 VITALS — BP 116/64 | HR 72 | Temp 97.6°F | Ht 61.0 in | Wt 180.0 lb

## 2021-11-30 DIAGNOSIS — D696 Thrombocytopenia, unspecified: Secondary | ICD-10-CM

## 2021-11-30 DIAGNOSIS — E113513 Type 2 diabetes mellitus with proliferative diabetic retinopathy with macular edema, bilateral: Secondary | ICD-10-CM | POA: Diagnosis not present

## 2021-11-30 DIAGNOSIS — Z794 Long term (current) use of insulin: Secondary | ICD-10-CM | POA: Diagnosis not present

## 2021-11-30 DIAGNOSIS — E118 Type 2 diabetes mellitus with unspecified complications: Secondary | ICD-10-CM

## 2021-11-30 DIAGNOSIS — K7469 Other cirrhosis of liver: Secondary | ICD-10-CM | POA: Diagnosis not present

## 2021-11-30 DIAGNOSIS — M7989 Other specified soft tissue disorders: Secondary | ICD-10-CM | POA: Diagnosis not present

## 2021-11-30 LAB — COMPREHENSIVE METABOLIC PANEL
ALT: 23 U/L (ref 0–35)
AST: 34 U/L (ref 0–37)
Albumin: 4.2 g/dL (ref 3.5–5.2)
Alkaline Phosphatase: 64 U/L (ref 39–117)
BUN: 12 mg/dL (ref 6–23)
CO2: 28 mEq/L (ref 19–32)
Calcium: 9.3 mg/dL (ref 8.4–10.5)
Chloride: 100 mEq/L (ref 96–112)
Creatinine, Ser: 0.75 mg/dL (ref 0.40–1.20)
GFR: 84.23 mL/min (ref >60.00)
Glucose, Bld: 140 mg/dL — ABNORMAL HIGH (ref 70–99)
Potassium: 4 mEq/L (ref 3.5–5.1)
Sodium: 136 mEq/L (ref 135–145)
Total Bilirubin: 0.7 mg/dL (ref 0.2–1.2)
Total Protein: 7 g/dL (ref 6.0–8.3)

## 2021-11-30 LAB — CBC WITH DIFFERENTIAL/PLATELET
Basophils Absolute: 0 10*3/uL (ref 0.0–0.1)
Basophils Relative: 0.3 % (ref 0.0–3.0)
Eosinophils Absolute: 0.1 10*3/uL (ref 0.0–0.7)
Eosinophils Relative: 3.2 % (ref 0.0–5.0)
HCT: 38.1 % (ref 36.0–46.0)
Hemoglobin: 12.5 g/dL (ref 12.0–15.0)
Lymphocytes Relative: 34.3 % (ref 12.0–46.0)
Lymphs Abs: 1.2 10*3/uL (ref 0.7–4.0)
MCHC: 32.8 g/dL (ref 30.0–36.0)
MCV: 87.3 fl (ref 78.0–100.0)
Monocytes Absolute: 0.2 10*3/uL (ref 0.1–1.0)
Monocytes Relative: 6.3 % (ref 3.0–12.0)
Neutro Abs: 2 10*3/uL (ref 1.4–7.7)
Neutrophils Relative %: 55.9 % (ref 43.0–77.0)
Platelets: 97 10*3/uL — ABNORMAL LOW (ref 150.0–400.0)
RBC: 4.37 Mil/uL (ref 3.87–5.11)
RDW: 15.1 % (ref 11.5–15.5)
WBC: 3.5 10*3/uL — ABNORMAL LOW (ref 4.0–10.5)

## 2021-11-30 LAB — POCT GLYCOSYLATED HEMOGLOBIN (HGB A1C): Hemoglobin A1C: 7.8 % — AB (ref 4.0–5.6)

## 2021-11-30 NOTE — Assessment & Plan Note (Signed)
Last saw GI a year ago, AMA elevated, recommended colonoscopy endoscopy and ultrasound which were never completed. We will order AFP, right upper quadrant ultrasound, encourage GI follow-up as overdue.

## 2021-11-30 NOTE — Assessment & Plan Note (Signed)
Cirrhosis related - update CBC.  

## 2021-11-30 NOTE — Assessment & Plan Note (Signed)
Endorses 2-week history of left leg and foot swelling without obvious cellulitis.  Left calf circumference 2 cm larger.  We will check D-dimer and if abnormal proceed with left leg venous ultrasound.

## 2021-11-30 NOTE — Assessment & Plan Note (Addendum)
Chronic, continued improvement noted. She is tolerating ozempic well - and picked up a supply from our office through [facility]. Freestyle libre 2 logs reviewed. Some hypoglycemia predominantly between 9pm and 9am - will change metformin XR to '750mg'$  BID (was taking 1500 at night). RTC 3 mo DM f/u visit.  Foot exam today - evidence of interdigital maceration - recommend OTC lotrimin. Has upcoming podiatry appt next week.

## 2021-11-30 NOTE — Patient Instructions (Addendum)
Change metformin XR to '750mg'$  1 tablet twice daily instead of all at night.  One lab test today.  We will refer you for liver ultrasound as well.  Return in 3 months for diabetes follow up visit.  Follow up with GI as you're overdue.

## 2021-11-30 NOTE — Progress Notes (Signed)
Patient ID: Karen [name], female    DOB: 1956/03/05, 66 y.o.   MRN: [id number]  This visit was conducted in person.  BP 116/64   Pulse 72   Temp 97.6 F (36.4 C) (Temporal)   Ht '5\' 1"'$  (1.549 m)   Wt 180 lb (81.6 kg)   SpO2 95%   BMI 34.01 kg/m    CC: DM f/u visit  Subjective:   HPI: Karen [name] is a 66 y.o. female presenting on 11/30/2021 for Diabetes (Here for f/u.)   Multiple late cancellations and 1 missed visit recently.   Notes left leg swelling over the past 2 weeks without recent injury or fall, no redness warmth or pain of left leg.   DM - does regularly check sugars and brings log which was reviewed: time in target 68% in the past week, 6 low glucose events in the past 2 wks, 1 in the past week, average sugar 157 in hte past week. . Compliant with antihyperglycemic regimen which includes: tresiba 100u daily, novolin R 25u TID AC + SSI, metformin XR '1500mg'$  daily, ozempic '1mg'$  weekly. She buys [place]. Rare low sugars or hypoglycemic symptoms. Denies paresthesias. Occ blurry vision. Last diabetic eye exam 02/2021. Glucometer brand: Freestyle Libre 2. Last foot exam: 12/2020 - DUE. DSME: declines. She states she does know how to count carbs. Lab Results  Component Value Date   HGBA1C 7.8 (A) 11/30/2021   Diabetic Foot Exam - Simple   Simple Foot Form Diabetic Foot exam was performed with the following findings: Yes 11/30/2021  1:02 PM  Visual Inspection See comments: Yes Sensation Testing See comments: Yes Pulse Check See comments: Yes Comments Diminished pulses Maceration between 4th/5th toes on left Callus formation to lateral sole of L foot Swelling to L foot present Diminished sensation to light touch and monofilament testing    Lab Results  Component Value Date   MICROALBUR 4.1 (H) 08/12/2021        Relevant past medical, surgical, family and social history reviewed and updated as indicated. Interim medical history since our last visit  reviewed. Allergies and medications reviewed and updated. Outpatient Medications Prior to Visit  Medication Sig Dispense Refill   acetaminophen (TYLENOL) 500 MG tablet Take 1,000 mg by mouth 3 (three) times daily as needed for mild pain.     aspirin EC 81 MG EC tablet Take 1 tablet (81 mg total) by mouth daily. (Patient taking differently: Take 81 mg by mouth at bedtime.)     Continuous Blood Gluc Receiver (FREESTYLE LIBRE 2 READER) DEVI Use as directed to check sugars 3-4 times daily 1 each 0   Continuous Blood Gluc Sensor (FREESTYLE LIBRE 2 SENSOR) MISC Use as directed to check blood sugars 3-4 times daily 6 each 3   dicyclomine (BENTYL) 20 MG tablet Take 1 tablet (20 mg total) by mouth 2 (two) times daily as needed for spasms. (Patient taking differently: Take 20 mg by mouth as needed for spasms.) 30 tablet 3   furosemide (LASIX) 20 MG tablet Take 1 tablet (20 mg total) by mouth daily as needed for edema. 30 tablet 11   gabapentin (NEURONTIN) 300 MG capsule TAKE ONE CAPSULE BY MOUTH AT BEDTIME 90 capsule 0   Insulin Pen Needle (PEN NEEDLES) 31G X 8 MM MISC Use to inject Lantus as directed twice daily. Dx: E11.40 100 each 3   KLOR-CON SPRINKLE 10 MEQ CR capsule Take 1 capsule (10 mEq total) by mouth daily as needed. When  taking Lasix (Patient taking differently: Take 10 mEq by mouth daily as needed (when taking furosemide).) 30 capsule 11   lidocaine (HM LIDOCAINE PATCH) 4 % Place 1 patch onto the skin every 12 (twelve) hours as needed. 30 patch 0   losartan (COZAAR) 25 MG tablet Take 1 tablet (25 mg total) by mouth daily. 90 tablet 0   mupirocin ointment (BACTROBAN) 2 % Apply 1 application topically 2 (two) times daily. 22 g 0   nitroGLYCERIN (NITROSTAT) 0.4 MG SL tablet Place 1 tablet (0.4 mg total) under the tongue every 5 (five) minutes as needed for chest pain. 25 tablet 0   NOVOLIN R 100 UNIT/ML injection INJECT 0.25 mls (25 units_ into THE SKIN THREE TIMES DAILY BEFORE MEALS plus sliding  scale FIVE units FOR every 50 over 200 10 mL 0   omeprazole (PRILOSEC) 40 MG capsule Take 1 capsule (40 mg total) by mouth 2 (two) times daily. 180 capsule 2   PENTIPS 31G X 6 MM MISC Use to inject Lantus as directed twice daily. Dx E11.40     polyethylene glycol (MIRALAX / GLYCOLAX) 17 g packet Take 17 g by mouth daily as needed for mild constipation.     promethazine (PHENERGAN) 25 MG tablet Take 0.5-1 tablets (12.5-25 mg total) by mouth 2 (two) times daily as needed for nausea. 20 tablet 0   propranolol (INDERAL) 20 MG tablet TAKE ONE TABLET BY MOUTH TWICE DAILY 180 tablet 2   rosuvastatin (CRESTOR) 10 MG tablet TAKE ONE TABLET BY MOUTH EVERY DAY 90 tablet 2   Semaglutide, 1 MG/DOSE, 4 MG/3ML SOPN Inject 1 mg as directed once a week. (Patient taking differently: Inject 1 mg as directed every Sunday.) 3 mL 11   spironolactone (ALDACTONE) 25 MG tablet TAKE ONE-HALF TABLET DAILY (Patient taking differently: Take 12.5 mg by mouth daily.) 45 tablet 3   TRESIBA FLEXTOUCH 200 UNIT/ML FlexTouch Pen INJECT 100 units into THE SKIN daily 18 mL 3   venlafaxine XR (EFFEXOR XR) 75 MG 24 hr capsule Take 1 capsule (75 mg total) by mouth daily with breakfast. 30 capsule 11   metFORMIN (GLUCOPHAGE-XR) 750 MG 24 hr tablet Take 2 tablets (1,500 mg total) by mouth every evening. 180 tablet 0   metFORMIN (GLUCOPHAGE-XR) 750 MG 24 hr tablet Take 1 tablet (750 mg total) by mouth in the morning and at bedtime.     No facility-administered medications prior to visit.     Per HPI unless specifically indicated in ROS section below Review of Systems  Objective:  BP 116/64   Pulse 72   Temp 97.6 F (36.4 C) (Temporal)   Ht '5\' 1"'$  (1.549 m)   Wt 180 lb (81.6 kg)   SpO2 95%   BMI 34.01 kg/m   Wt Readings from Last 3 Encounters:  11/30/21 180 lb (81.6 kg)  09/26/21 179 lb 0.2 oz (81.2 kg)  08/16/21 179 lb (81.2 kg)      Physical Exam Vitals and nursing note reviewed.  Constitutional:      Appearance: Normal  appearance. She is not ill-appearing.  Eyes:     Extraocular Movements: Extraocular movements intact.     Conjunctiva/sclera: Conjunctivae normal.     Pupils: Pupils are equal, round, and reactive to light.  Cardiovascular:     Rate and Rhythm: Normal rate and regular rhythm.     Pulses: Normal pulses.     Heart sounds: Normal heart sounds. No murmur heard. Pulmonary:     Effort: Pulmonary effort  is normal. No respiratory distress.     Breath sounds: Normal breath sounds. No wheezing, rhonchi or rales.  Musculoskeletal:        General: No tenderness.     Right lower leg: Edema (tr) present.     Left lower leg: Edema (1+ predominantly to foot) present.     Comments:  R calf circ 37cm L calf circ 39cm  Skin:    General: Skin is warm and dry.     Findings: No rash.  Neurological:     Mental Status: She is alert.  Psychiatric:        Mood and Affect: Mood normal.        Behavior: Behavior normal.       Results for orders placed or performed in visit on 11/30/21  POCT glycosylated hemoglobin (Hb A1C)  Result Value Ref Range   Hemoglobin A1C 7.8 (A) 4.0 - 5.6 %   HbA1c POC (<> result, manual entry)     HbA1c, POC (prediabetic range)     HbA1c, POC (controlled diabetic range)      Assessment & Plan:   Problem List Items Addressed This Visit     Diabetes mellitus type 2 with complications ([place]) - Primary    Chronic, continued improvement noted. She is tolerating ozempic well - and picked up a supply from our office through [facility]. Freestyle libre 2 logs reviewed. Some hypoglycemia predominantly between 9pm and 9am - will change metformin XR to '750mg'$  BID (was taking 1500 at night). RTC 3 mo DM f/u visit.  Foot exam today - evidence of interdigital maceration - recommend OTC lotrimin. Has upcoming podiatry appt next week.       Relevant Medications   metFORMIN (GLUCOPHAGE-XR) 750 MG 24 hr tablet   Other Relevant Orders   POCT glycosylated hemoglobin (Hb A1C) (Completed)    Cirrhosis of liver ([place])    Last saw GI a year ago, AMA elevated, recommended colonoscopy endoscopy and ultrasound which were never completed. We will order AFP, right upper quadrant ultrasound, encourage GI follow-up as overdue.      Relevant Orders   AFP tumor marker   Comprehensive metabolic panel   CBC with Differential/Platelet   US ABDOMEN LIMITED RUQ (LIVER/GB)   Thrombocytopenia (HCC)    Cirrhosis related-update CBC      Relevant Orders   CBC with Differential/Platelet   Left leg swelling    Endorses 2-week history of left leg and foot swelling without obvious cellulitis.  Left calf circumference 2 cm larger.  We will check D-dimer and if abnormal proceed with left leg venous ultrasound.      Relevant Orders   D-dimer, quantitative     No orders of the defined types were placed in this encounter.  Orders Placed This Encounter  Procedures   US ABDOMEN LIMITED RUQ (LIVER/GB)    Standing Status:   Future    Standing Expiration Date:   12/01/2022    Order Specific Question:   Reason for Exam (SYMPTOM  OR DIAGNOSIS REQUIRED)    Answer:   liver cirrhosis monitoring    Order Specific Question:   Preferred imaging location?    Answer:   [facility]   D-dimer, quantitative   AFP tumor marker   Comprehensive metabolic panel   CBC with Differential/Platelet   POCT glycosylated hemoglobin (Hb A1C)     Patient Instructions  Change metformin XR to '750mg'$  1 tablet twice daily instead of all at night.  One lab test  today.  We will refer you for liver ultrasound as well.  Return in 3 months for diabetes follow up visit.  Follow up with GI as you're overdue.   Follow up plan: Return if symptoms worsen or fail to improve.  [name], MD

## 2021-12-01 ENCOUNTER — Telehealth: Payer: Self-pay | Admitting: Internal Medicine

## 2021-12-01 ENCOUNTER — Encounter: Payer: Self-pay | Admitting: Family Medicine

## 2021-12-01 ENCOUNTER — Other Ambulatory Visit: Payer: Self-pay | Admitting: Family Medicine

## 2021-12-01 DIAGNOSIS — M7989 Other specified soft tissue disorders: Secondary | ICD-10-CM

## 2021-12-01 LAB — AFP TUMOR MARKER: AFP-Tumor Marker: 1.7 ng/mL

## 2021-12-01 LAB — D-DIMER, QUANTITATIVE: D-Dimer, Quant: 0.72 ug{FEU}/mL — ABNORMAL HIGH

## 2021-12-01 NOTE — Telephone Encounter (Signed)
[place] - Client TELEPHONE ADVICE RECORD [facility] Patient Name: Karen [name] CB Gender: Female DOB: 1955/10/18 Age: 66 Y 3 M 7 D Return Phone Number: [phone] (Primary), [phone] (Secondary) Address: City/ State/ Zip: [place]  [address] Client [place] Night - Client Client Site [place] Provider [name] - MD Contact Type Call Who Is Calling Lab / Radiology Lab Name [facility] Phone Number [phone] Lab Tech Name Ethlyn Gallery Reference Number [id number] Chief Complaint Lab Result (Critical or Stat) Call Type Lab Send to RN Reason for Call Report lab results Initial Comment Critical lab result. Translation No Nurse Assessment Nurse: Tamala Julian, RN, Karis Date/Time (Eastern Time): 12/01/2021 4:58:12 AM Is there an on-call provider listed? ---Yes Please list name of person reporting value (Lab Employee) and a contact number. ---Margit Banda [phone] Lelon Frohlich) Please document the following items: Lab name Lab value (read back to lab to verify) Reference range for lab value Date and time blood was drawn Collect time of birth for bilirubin results ---D-dimer quantitative critical high 0.72 (reference range less than 0.50) collected 11/30/2021 at 1315. No Previous lab Please collect the patient contact information from the lab. (name, phone number and address) ---Pauletta Browns [phone] Disp. Time Eilene Ghazi Time) Disposition Final User 12/01/2021 5:08:07 AM Called On-Call Provider Tamala Julian, RN, Karis 12/01/2021 5:08:46 AM Clinical Call Yes Tamala Julian RN, Karis Final Disposition 12/01/2021 5:08:46 AM Clinical Call Yes Tamala Julian, RN, Celso Sickle PLEASE NOTE: All timestamps contained within this report are represented as [place] Standard Time. CONFIDENTIALTY NOTICE: This fax transmission is intended only for the addressee. It contains information that is legally privileged, confidential  or otherwise protected from use or disclosure. If you are not the intended recipient, you are strictly prohibited from reviewing, disclosing, copying using or disseminating any of this information or taking any action in reliance on or regarding this information. If you have received this fax in error, please notify us immediately by telephone so that we can arrange for its return to [place]. Phone: [phone], Toll-Free: [phone], Fax: [phone] Page: 2 of 2 Call Id: [id number] Paging DoctorName Phone DateTime Result/ Outcome Message Type Notes Queen Slough [phone] 12/01/2021 5:08:07 AM Called On Call Provider - Reached Doctor Paged Queen Slough 12/01/2021 5:08:35 AM Spoke with On Call - General Message Result Spoke to [facility] Pricilla Holm- MD and advise of critical lab result. No further action require

## 2021-12-01 NOTE — Telephone Encounter (Signed)
See note below access nurse note; DR G already ordered US of lower lt leg (DVT). Sending note to Dr Darnell Level and Lattie Haw CMA.

## 2021-12-01 NOTE — Telephone Encounter (Signed)
Notified critical result high d-dimer at 0500. PCP has already ordered appropriate [place] leg. No further action taken.

## 2021-12-02 ENCOUNTER — Other Ambulatory Visit: Payer: Self-pay | Admitting: Family Medicine

## 2021-12-03 ENCOUNTER — Ambulatory Visit
Admission: RE | Admit: 2021-12-03 | Discharge: 2021-12-03 | Disposition: A | Discharge status: Home / Self Care | Payer: PPO | Source: Ambulatory Visit | Attending: Family Medicine | Admitting: Family Medicine

## 2021-12-03 DIAGNOSIS — K7469 Other cirrhosis of liver: Secondary | ICD-10-CM

## 2021-12-03 DIAGNOSIS — M7989 Other specified soft tissue disorders: Secondary | ICD-10-CM | POA: Diagnosis not present

## 2021-12-03 DIAGNOSIS — K7460 Unspecified cirrhosis of liver: Secondary | ICD-10-CM | POA: Diagnosis not present

## 2021-12-07 ENCOUNTER — Ambulatory Visit (INDEPENDENT_AMBULATORY_CARE_PROVIDER_SITE_OTHER): Payer: PPO | Admitting: Podiatry

## 2021-12-07 ENCOUNTER — Ambulatory Visit: Payer: HMO | Admitting: Podiatry

## 2021-12-07 ENCOUNTER — Encounter: Payer: Self-pay | Admitting: Podiatry

## 2021-12-07 DIAGNOSIS — M2041 Other hammer toe(s) (acquired), right foot: Secondary | ICD-10-CM | POA: Diagnosis not present

## 2021-12-07 DIAGNOSIS — M2012 Hallux valgus (acquired), left foot: Secondary | ICD-10-CM

## 2021-12-07 DIAGNOSIS — M2011 Hallux valgus (acquired), right foot: Secondary | ICD-10-CM

## 2021-12-07 DIAGNOSIS — B351 Tinea unguium: Secondary | ICD-10-CM

## 2021-12-07 DIAGNOSIS — D2372 Other benign neoplasm of skin of left lower limb, including hip: Secondary | ICD-10-CM

## 2021-12-07 DIAGNOSIS — M79676 Pain in unspecified toe(s): Secondary | ICD-10-CM

## 2021-12-07 DIAGNOSIS — E1142 Type 2 diabetes mellitus with diabetic polyneuropathy: Secondary | ICD-10-CM

## 2021-12-07 DIAGNOSIS — D2371 Other benign neoplasm of skin of right lower limb, including hip: Secondary | ICD-10-CM

## 2021-12-07 DIAGNOSIS — L84 Corns and callosities: Secondary | ICD-10-CM | POA: Diagnosis not present

## 2021-12-07 DIAGNOSIS — M2042 Other hammer toe(s) (acquired), left foot: Secondary | ICD-10-CM | POA: Diagnosis not present

## 2021-12-08 NOTE — Progress Notes (Signed)
She presents today chief complaint of painful elongated toenails with corns and calluses.  She also like to pick up her diabetic shoes.  Objective: Vital signs are stable she is alert and oriented x3 she has strong pulses bilateral capillary fill time is immediate neurologic sensorium is diminished per Semmes Weinstein monofilament hammertoes are noted bilateral toenails are long thick yellow dystrophic clinical mycotic multiple benign skin lesions plantar aspect of the bilateral forefoot and toes.  Assessment: Diabetic with diabetic peripheral neuropathy pain in limb secondary to onychomycosis as well as benign skin lesions.  Plan: Dispensed diabetic shoes today and debrided nails and benign skin lesions.  Follow-up with her in 6 months.  She will watch for any irritation with these new shoes she is to notify us if there is any.

## 2021-12-23 ENCOUNTER — Other Ambulatory Visit: Payer: Self-pay | Admitting: Family Medicine

## 2022-01-01 ENCOUNTER — Other Ambulatory Visit: Payer: Self-pay | Admitting: Family Medicine

## 2022-01-04 ENCOUNTER — Telehealth: Payer: Self-pay

## 2022-01-04 NOTE — Telephone Encounter (Signed)
Received pt's med shipment of Ozempic 1 mg (4 boxes total).    Notified pt via MyChart message.  [Placed Ozempic in 2nd refrigerator, 1st shelf.]

## 2022-01-04 NOTE — Progress Notes (Signed)
Reason for Encounter: CCM (Eye Exam)   Contacted patient in regard to updated eye exam information. Patient is due for annual eye exam on 03/10/22. I called to offer eye exam at [facility] on November 15. No answer; left message.  [name], CPP notified  Marijean Niemann, [place] Clinical Pharmacy Assistant [phone]

## 2022-01-25 ENCOUNTER — Telehealth: Payer: Self-pay | Admitting: Family Medicine

## 2022-01-25 NOTE — Telephone Encounter (Signed)
Left message on voicemail for patient to call the office back. The reason for the call is because a mychart message was sent to patient regarding receiving her medication for her to pickup. Received a notice that mychart message has not been read.

## 2022-01-25 NOTE — Telephone Encounter (Signed)
Pt returned call stated message can be left on her MyChart as well

## 2022-02-16 ENCOUNTER — Telehealth: Payer: Self-pay

## 2022-02-16 MED ORDER — FUROSEMIDE 20 MG PO TABS: 20.0000 mg | ORAL_TABLET | Freq: Every day | ORAL | 0 refills | Status: DC | PRN

## 2022-02-16 NOTE — Telephone Encounter (Signed)
E-scribed refill 

## 2022-02-21 ENCOUNTER — Other Ambulatory Visit: Payer: Self-pay | Admitting: Family Medicine

## 2022-03-02 ENCOUNTER — Ambulatory Visit: Payer: HMO | Admitting: Family Medicine

## 2022-03-07 ENCOUNTER — Telehealth: Payer: Self-pay

## 2022-03-07 ENCOUNTER — Other Ambulatory Visit: Payer: Self-pay | Admitting: Family Medicine

## 2022-03-07 DIAGNOSIS — E113513 Type 2 diabetes mellitus with proliferative diabetic retinopathy with macular edema, bilateral: Secondary | ICD-10-CM

## 2022-03-07 NOTE — Telephone Encounter (Signed)
Left message about ozempic at office and pt. Needs to call and schedule her screening mammogram

## 2022-03-07 NOTE — Chronic Care Management (AMB) (Deleted)
Attempted to contact the patient for a friendly reminder to schedule her screening mammogram. Left message to contact me with any questions .  [name], CPP notified  Avel Sensor, [place]  [phone]

## 2022-03-08 NOTE — Chronic Care Management (AMB) (Signed)
Called again and left messages to schedule screening mammogram and informed patient that medication was at office in refrigerator for her.  [name], CPP notified  Karen [name], [place]  [phone]

## 2022-03-08 NOTE — Telephone Encounter (Signed)
error 

## 2022-03-08 NOTE — Telephone Encounter (Signed)
Left another message for the patient to return call .   Avel Sensor, [place]  [phone]

## 2022-03-11 ENCOUNTER — Ambulatory Visit: Payer: HMO | Admitting: Family Medicine

## 2022-03-18 ENCOUNTER — Ambulatory Visit: Payer: HMO | Admitting: Family Medicine

## 2022-03-22 ENCOUNTER — Ambulatory Visit: Payer: HMO | Admitting: Podiatry

## 2022-03-25 ENCOUNTER — Ambulatory Visit: Payer: HMO | Admitting: Family Medicine

## 2022-03-29 ENCOUNTER — Ambulatory Visit (INDEPENDENT_AMBULATORY_CARE_PROVIDER_SITE_OTHER): Payer: HMO | Admitting: Podiatry

## 2022-03-29 ENCOUNTER — Encounter: Payer: Self-pay | Admitting: Podiatry

## 2022-03-29 DIAGNOSIS — M79676 Pain in unspecified toe(s): Secondary | ICD-10-CM

## 2022-03-29 DIAGNOSIS — D2371 Other benign neoplasm of skin of right lower limb, including hip: Secondary | ICD-10-CM

## 2022-03-29 DIAGNOSIS — B351 Tinea unguium: Secondary | ICD-10-CM

## 2022-03-29 DIAGNOSIS — E1142 Type 2 diabetes mellitus with diabetic polyneuropathy: Secondary | ICD-10-CM

## 2022-03-29 DIAGNOSIS — D2372 Other benign neoplasm of skin of left lower limb, including hip: Secondary | ICD-10-CM

## 2022-03-29 NOTE — Progress Notes (Signed)
She presents today for chief complaint of painful elongated toenails.  Objective: Vital signs are stable alert oriented x3.  Pulses are palpable.  She has no change in her diabetic peripheral neuropathy.  Toenails are long thick yellow dystrophic-like mycotic painful palpation as well as debridement.  Assessment: Pain in limb secondary to onychomycosis diabetes mellitus diabetic peripheral neuropathy.  Plan: Debrided toenails 1 through 5 bilateral.

## 2022-04-01 ENCOUNTER — Other Ambulatory Visit: Payer: Self-pay | Admitting: Family Medicine

## 2022-04-01 NOTE — Telephone Encounter (Signed)
Too early. Last filled on 04/01/22.

## 2022-04-06 ENCOUNTER — Ambulatory Visit: Payer: HMO | Admitting: Family Medicine

## 2022-04-13 ENCOUNTER — Encounter: Payer: Self-pay | Admitting: Family Medicine

## 2022-04-13 ENCOUNTER — Ambulatory Visit (INDEPENDENT_AMBULATORY_CARE_PROVIDER_SITE_OTHER): Payer: HMO | Admitting: Family Medicine

## 2022-04-13 VITALS — BP 122/68 | HR 75 | Temp 97.4°F | Ht 61.0 in | Wt 175.5 lb

## 2022-04-13 DIAGNOSIS — B372 Candidiasis of skin and nail: Secondary | ICD-10-CM

## 2022-04-13 DIAGNOSIS — E113513 Type 2 diabetes mellitus with proliferative diabetic retinopathy with macular edema, bilateral: Secondary | ICD-10-CM | POA: Diagnosis not present

## 2022-04-13 DIAGNOSIS — E118 Type 2 diabetes mellitus with unspecified complications: Secondary | ICD-10-CM | POA: Diagnosis not present

## 2022-04-13 DIAGNOSIS — K7469 Other cirrhosis of liver: Secondary | ICD-10-CM

## 2022-04-13 DIAGNOSIS — Z23 Encounter for immunization: Secondary | ICD-10-CM | POA: Diagnosis not present

## 2022-04-13 DIAGNOSIS — Z794 Long term (current) use of insulin: Secondary | ICD-10-CM

## 2022-04-13 LAB — POCT GLYCOSYLATED HEMOGLOBIN (HGB A1C): Hemoglobin A1C: 7.4 % — AB (ref 4.0–5.6)

## 2022-04-13 MED ORDER — NOVOLIN R FLEXPEN 100 UNIT/ML IJ SOPN: 50.0000 [IU] | PEN_INJECTOR | Freq: Three times a day (TID) | INTRAMUSCULAR | 11 refills | Status: DC

## 2022-04-13 MED ORDER — NYSTATIN 100000 UNIT/GM EX CREA: 1.0000 | TOPICAL_CREAM | Freq: Two times a day (BID) | CUTANEOUS | 1 refills | Status: DC

## 2022-04-13 NOTE — Assessment & Plan Note (Signed)
Exam most consistent with this - Rx nystatin cream. Update if not improving with treatment.

## 2022-04-13 NOTE — Assessment & Plan Note (Signed)
Due for eye exam - advised call and schedule appointment.

## 2022-04-13 NOTE — Assessment & Plan Note (Signed)
Overdue for GI f/u - encouraged she call and schedule appt.  New referral placed.

## 2022-04-13 NOTE — Progress Notes (Signed)
Patient ID: Karen [name], female    DOB: [birth year], 66 y.o.   MRN: [id number]  This visit was conducted in person.  BP 122/68   Pulse 75   Temp (!) 97.4 F (36.3 C) (Temporal)   Ht '5\' 1"'$  (1.549 m)   Wt 175 lb 8 oz (79.6 kg)   SpO2 97%   BMI 33.16 kg/m    CC: DM 3 mo f/u visit Subjective:   HPI: Karen [name] is a 66 y.o. female presenting on 04/13/2022 for Diabetes (Here for f/u.)   Several missed appointments the past 4 months.   Notes rash under breasts and in groin over the past 3-4 weeks. Itchy and irritated rash. She tried monistat and cortizone 10 without benefit.   Liver cirrhosis latest RUQ [place] 11/2021, also showed incidental mid cholelithiasis and mild gallbladder wall thickening. Overdue for GI f/u visit.   DM - does regularly check sugars and brings Freestyle Libre 2 data which was reviewed: 30d average 173, 56% in range, 42% above range. Compliant with antihyperglycemic regimen which includes: metformin XR '750mg'$  BID, ozempic '1mg'$  weekly, tresiba 100u daily, novolin R 25u TID with meals + SSI. 5 episodes of low sugars the past 30 days. Denies paresthesias, blurry vision. Last diabetic eye exam 02/2021 - DUE. Glucometer brand: Freestyle Libre 2 CGM. Last foot exam: 11/2021, also sees podiatry most recently 03/29/2022, has diabetic shoes. DSME: has declined.  Lab Results  Component Value Date   HGBA1C 7.4 (A) 04/13/2022   Diabetic Foot Exam - Simple   No data filed    Lab Results  Component Value Date   MICROALBUR 4.1 (H) 08/12/2021         Relevant past medical, surgical, family and social history reviewed and updated as indicated. Interim medical history since our last visit reviewed. Allergies and medications reviewed and updated. Outpatient Medications Prior to Visit  Medication Sig Dispense Refill   acetaminophen (TYLENOL) 500 MG tablet Take 1,000 mg by mouth 3 (three) times daily as needed for mild pain.     aspirin EC 81 MG EC tablet Take 1 tablet (81  mg total) by mouth daily. (Patient taking differently: Take 81 mg by mouth at bedtime.)     Continuous Blood Gluc Receiver (FREESTYLE LIBRE 2 READER) DEVI Use as directed to check sugars 3-4 times daily 1 each 0   Continuous Blood Gluc Sensor (FREESTYLE LIBRE 2 SENSOR) MISC Use as directed to check blood sugars 3-4 times daily 6 each 3   dicyclomine (BENTYL) 20 MG tablet Take 1 tablet (20 mg total) by mouth 2 (two) times daily as needed for spasms. (Patient taking differently: Take 20 mg by mouth as needed for spasms.) 30 tablet 3   furosemide (LASIX) 20 MG tablet Take 1 tablet (20 mg total) by mouth daily as needed for edema. 30 tablet 0   gabapentin (NEURONTIN) 300 MG capsule TAKE ONE CAPSULE BY MOUTH AT BEDTIME 90 capsule 1   Insulin Pen Needle (PEN NEEDLES) 31G X 8 MM MISC Use to inject Lantus as directed twice daily. Dx: E11.40 100 each 3   KLOR-CON SPRINKLE 10 MEQ CR capsule Take 1 capsule (10 mEq total) by mouth daily as needed. When taking Lasix (Patient taking differently: Take 10 mEq by mouth daily as needed (when taking furosemide).) 30 capsule 11   lidocaine (HM LIDOCAINE PATCH) 4 % Place 1 patch onto the skin every 12 (twelve) hours as needed. 30 patch 0   losartan (COZAAR)  25 MG tablet TAKE ONE TABLET BY MOUTH EVERY DAY 90 tablet 1   metFORMIN (GLUCOPHAGE-XR) 750 MG 24 hr tablet Take 1 tablet (750 mg total) by mouth in the morning and at bedtime. 180 tablet 0   mupirocin ointment (BACTROBAN) 2 % Apply 1 application topically 2 (two) times daily. 22 g 0   nitroGLYCERIN (NITROSTAT) 0.4 MG SL tablet Place 1 tablet (0.4 mg total) under the tongue every 5 (five) minutes as needed for chest pain. 25 tablet 0   NOVOLIN R 100 UNIT/ML injection INJECT 0.25ML (25 UNITS INTO THE SKIN THREE TIMES DAILY BEFORE MEALS PLUS SLIDING SCALE FOR EVERY 50 OVER 200 10 mL 0   omeprazole (PRILOSEC) 40 MG capsule Take 1 capsule (40 mg total) by mouth 2 (two) times daily. 180 capsule 2   PENTIPS 31G X 6 MM MISC  Use to inject Lantus as directed twice daily. Dx E11.40     polyethylene glycol (MIRALAX / GLYCOLAX) 17 g packet Take 17 g by mouth daily as needed for mild constipation.     promethazine (PHENERGAN) 25 MG tablet Take 0.5-1 tablets (12.5-25 mg total) by mouth 2 (two) times daily as needed for nausea. 20 tablet 0   propranolol (INDERAL) 20 MG tablet TAKE ONE TABLET BY MOUTH TWICE DAILY 180 tablet 2   rosuvastatin (CRESTOR) 10 MG tablet TAKE ONE TABLET BY MOUTH EVERY DAY 90 tablet 2   Semaglutide, 1 MG/DOSE, 4 MG/3ML SOPN Inject 1 mg as directed once a week. (Patient taking differently: Inject 1 mg as directed every Sunday.) 3 mL 11   spironolactone (ALDACTONE) 25 MG tablet TAKE ONE-HALF TABLET DAILY (Patient taking differently: Take 12.5 mg by mouth daily.) 45 tablet 3   TRESIBA FLEXTOUCH 200 UNIT/ML FlexTouch Pen INJECT 100 units into THE SKIN EVERY DAY 18 mL 0   venlafaxine XR (EFFEXOR XR) 75 MG 24 hr capsule Take 1 capsule (75 mg total) by mouth daily with breakfast. 30 capsule 11   No facility-administered medications prior to visit.     Per HPI unless specifically indicated in ROS section below Review of Systems  Objective:  BP 122/68   Pulse 75   Temp (!) 97.4 F (36.3 C) (Temporal)   Ht '5\' 1"'$  (1.549 m)   Wt 175 lb 8 oz (79.6 kg)   SpO2 97%   BMI 33.16 kg/m   Wt Readings from Last 3 Encounters:  04/13/22 175 lb 8 oz (79.6 kg)  11/30/21 180 lb (81.6 kg)  09/26/21 179 lb 0.2 oz (81.2 kg)      Physical Exam Vitals and nursing note reviewed.  Constitutional:      Appearance: Normal appearance. She is not ill-appearing.  Eyes:     Extraocular Movements: Extraocular movements intact.     Conjunctiva/sclera: Conjunctivae normal.     Pupils: Pupils are equal, round, and reactive to light.  Cardiovascular:     Rate and Rhythm: Normal rate and regular rhythm.     Pulses: Normal pulses.     Heart sounds: Murmur (2/6 systolic) heard.  Pulmonary:     Effort: Pulmonary effort is  normal. No respiratory distress.     Breath sounds: Normal breath sounds. No wheezing, rhonchi or rales.  Musculoskeletal:     Right lower leg: No edema.     Left lower leg: No edema.  Skin:    General: Skin is warm and dry.     Findings: Rash present.     Comments: Maceration of skin crease below  bilateral breasts and in groin, evidence of satellite lesions to groin, no significant erythema at breast rash  Neurological:     Mental Status: She is alert.  Psychiatric:        Mood and Affect: Mood normal.        Behavior: Behavior normal.       Results for orders placed or performed in visit on 04/13/22  POCT glycosylated hemoglobin (Hb A1C)  Result Value Ref Range   Hemoglobin A1C 7.4 (A) 4.0 - 5.6 %   HbA1c POC (<> result, manual entry)     HbA1c, POC (prediabetic range)     HbA1c, POC (controlled diabetic range)      Assessment & Plan:   Problem List Items Addressed This Visit       Unprioritized   Diabetes mellitus type 2 with complications ([place]) - Primary    Chronic, continued improvement noted. No med changes at this time. I did change novolin R from vials to pen per pt preference. She regularly sees podiatrist. Recommend schedule eye exam as due.  Reassess control at CPE.       Relevant Medications   Insulin Regular Human (NOVOLIN R FLEXPEN RELION) 100 UNIT/ML KwikPen   Other Relevant Orders   POCT glycosylated hemoglobin (Hb A1C) (Completed)   Type 2 diabetes mellitus with proliferative diabetic retinopathy with macular edema, bilateral ([place])    Due for eye exam - advised call and schedule appointment.       Relevant Medications   Insulin Regular Human (NOVOLIN R FLEXPEN RELION) 100 UNIT/ML KwikPen   Cirrhosis of liver ([place])    Overdue for GI f/u - encouraged she call and schedule appt.  New referral placed.       Relevant Orders   Ambulatory referral to Gastroenterology   Candidal intertrigo    Exam most consistent with this - Rx nystatin cream. Update  if not improving with treatment.       Relevant Medications   nystatin cream (MYCOSTATIN)   Other Visit Diagnoses     Need for influenza vaccination       Relevant Orders   Flu Vaccine QUAD 58moIM (Fluarix, Fluzone & Alfiuria Quad PF) (Completed)        Meds ordered this encounter  Medications   nystatin cream (MYCOSTATIN)    Sig: Apply 1 Application topically 2 (two) times daily.    Dispense:  30 g    Refill:  1   Insulin Regular Human (NOVOLIN R FLEXPEN RELION) 100 UNIT/ML KwikPen    Sig: Inject 50 Units as directed 3 (three) times daily before meals. + SSI 5 units for every 50 units over 250    Dispense:  45 mL    Refill:  11    Fill pens in place of vials   Orders Placed This Encounter  Procedures   Flu Vaccine QUAD 656moM (Fluarix, Fluzone & Alfiuria Quad PF)   Ambulatory referral to Gastroenterology    Referral Priority:   Routine    Referral Type:   Consultation    Referral Reason:   Specialty Services Required    Number of Visits Requested:   1   POCT glycosylated hemoglobin (Hb A1C)     Patient Instructions  Flu shot today  You are doing well today.  Call to schedule eye exam.  Call [facility] to schedule an appointment at [phone] New referral placed. For itchy rash - try nystatin cream sent to pharmacy or over the counter lotrimin  antifungal cream.  Return in 4 months for physical/wellness visit   Follow up plan: Return in about 4 months (around 08/18/2022) for annual exam, prior fasting for blood work, medicare wellness visit.  [name], MD

## 2022-04-13 NOTE — Patient Instructions (Addendum)
Flu shot today  You are doing well today.  Call to schedule eye exam.  Call [facility] to schedule an appointment at [phone].  New referral placed. For itchy rash - try nystatin cream sent to pharmacy or over the counter lotrimin antifungal cream.  Return in 4 months for physical/wellness visit

## 2022-04-13 NOTE — Assessment & Plan Note (Signed)
Chronic, continued improvement noted. No med changes at this time. I did change novolin R from vials to pen per pt preference. She regularly sees podiatrist. Recommend schedule eye exam as due.  Reassess control at CPE.

## 2022-04-18 ENCOUNTER — Other Ambulatory Visit: Payer: Self-pay | Admitting: Family Medicine

## 2022-04-18 DIAGNOSIS — E118 Type 2 diabetes mellitus with unspecified complications: Secondary | ICD-10-CM

## 2022-04-23 ENCOUNTER — Encounter: Payer: Self-pay | Admitting: Family Medicine

## 2022-04-27 NOTE — Telephone Encounter (Signed)
Unable to reach pt by phone per DPR only to speak with pt. Left v/m requesting pt to cb [facility] for additional info and triage and after 5 PM can call and a nurse will triage pt; also my charted pt to call our office. Sending note to [place] triage and Danise Mina pool.

## 2022-04-27 NOTE — Telephone Encounter (Signed)
Plz triage patient

## 2022-04-28 ENCOUNTER — Encounter: Payer: Self-pay | Admitting: Family Medicine

## 2022-04-28 NOTE — Telephone Encounter (Signed)
See 04/23/22 phn note.

## 2022-04-28 NOTE — Telephone Encounter (Signed)
Unable to speak with pt on phone and left v/m requesting to pt cb to [facility] for additional info.

## 2022-04-29 NOTE — Telephone Encounter (Signed)
See my chart message 04/28/22. Dr. Danise Mina has been corresponding with her.

## 2022-05-12 ENCOUNTER — Other Ambulatory Visit: Payer: Self-pay | Admitting: Family Medicine

## 2022-05-12 NOTE — Telephone Encounter (Signed)
Too soon. Last filled 05/12/22.

## 2022-06-13 ENCOUNTER — Other Ambulatory Visit: Payer: Self-pay | Admitting: Family Medicine

## 2022-06-13 DIAGNOSIS — I10 Essential (primary) hypertension: Secondary | ICD-10-CM

## 2022-06-13 DIAGNOSIS — E1169 Type 2 diabetes mellitus with other specified complication: Secondary | ICD-10-CM

## 2022-06-13 DIAGNOSIS — E118 Type 2 diabetes mellitus with unspecified complications: Secondary | ICD-10-CM

## 2022-06-15 ENCOUNTER — Other Ambulatory Visit (HOSPITAL_COMMUNITY): Payer: Self-pay

## 2022-06-17 ENCOUNTER — Other Ambulatory Visit: Payer: Self-pay | Admitting: Family Medicine

## 2022-06-17 NOTE — Telephone Encounter (Signed)
Refill request Spironolactone Last refill 12/24/21 #90/1 Last office visit 04/13/22 See drug warning with Losartan and Spironolactone

## 2022-06-22 ENCOUNTER — Other Ambulatory Visit: Payer: Self-pay

## 2022-06-22 DIAGNOSIS — E113513 Type 2 diabetes mellitus with proliferative diabetic retinopathy with macular edema, bilateral: Secondary | ICD-10-CM

## 2022-06-22 MED ORDER — METFORMIN HCL ER 750 MG PO TB24: 750.0000 mg | ORAL_TABLET | Freq: Two times a day (BID) | ORAL | 0 refills | Status: DC

## 2022-06-22 NOTE — Telephone Encounter (Signed)
E-scribed refill 

## 2022-06-30 ENCOUNTER — Ambulatory Visit: Payer: HMO | Admitting: Podiatry

## 2022-07-06 ENCOUNTER — Ambulatory Visit: Payer: HMO | Admitting: Family Medicine

## 2022-07-07 ENCOUNTER — Ambulatory Visit: Payer: HMO | Admitting: Podiatry

## 2022-07-12 ENCOUNTER — Other Ambulatory Visit: Payer: Self-pay | Admitting: Family Medicine

## 2022-07-12 DIAGNOSIS — K219 Gastro-esophageal reflux disease without esophagitis: Secondary | ICD-10-CM

## 2022-07-12 NOTE — Telephone Encounter (Signed)
Attempted to call in refill. However, pharmacist states pt has refills available and will fill for pt.   Request denied.

## 2022-07-13 NOTE — Telephone Encounter (Signed)
Spoke with [facility] to confirm pt has refill available since we received another request. Told refill is ready for pick.   Duplicate denied.

## 2022-07-20 ENCOUNTER — Telehealth: Payer: Self-pay | Admitting: Family Medicine

## 2022-07-20 NOTE — Telephone Encounter (Signed)
Contacted [name] to schedule their annual wellness visit. Appointment made for 08/25/2022.  [facility] Direct Dial: [phone]

## 2022-08-02 ENCOUNTER — Ambulatory Visit: Payer: HMO | Admitting: Physician Assistant

## 2022-08-04 ENCOUNTER — Ambulatory Visit (INDEPENDENT_AMBULATORY_CARE_PROVIDER_SITE_OTHER): Payer: PPO | Admitting: Podiatry

## 2022-08-04 ENCOUNTER — Encounter: Payer: Self-pay | Admitting: Podiatry

## 2022-08-04 DIAGNOSIS — B351 Tinea unguium: Secondary | ICD-10-CM | POA: Diagnosis not present

## 2022-08-04 DIAGNOSIS — D2372 Other benign neoplasm of skin of left lower limb, including hip: Secondary | ICD-10-CM

## 2022-08-04 DIAGNOSIS — E1142 Type 2 diabetes mellitus with diabetic polyneuropathy: Secondary | ICD-10-CM | POA: Diagnosis not present

## 2022-08-04 DIAGNOSIS — M79676 Pain in unspecified toe(s): Secondary | ICD-10-CM

## 2022-08-04 DIAGNOSIS — D2371 Other benign neoplasm of skin of right lower limb, including hip: Secondary | ICD-10-CM

## 2022-08-08 NOTE — Progress Notes (Signed)
Presents today chief complaint of painful elongated toenails.  Objective: Pulses are palpable toenails are long thick yellow dystrophic with mycotic neurologic sensorium is diminished mild hammertoe deformities are noted.  Assessment: Diabetic peripheral neuropathy with hammertoe deformities pain and secondary to onychomycosis.  Plan: Debridement of toenails 1 through 5 bilateral.

## 2022-08-13 ENCOUNTER — Other Ambulatory Visit: Payer: Self-pay | Admitting: Family Medicine

## 2022-08-13 ENCOUNTER — Telehealth: Payer: Self-pay | Admitting: Family Medicine

## 2022-08-13 DIAGNOSIS — K7469 Other cirrhosis of liver: Secondary | ICD-10-CM

## 2022-08-13 DIAGNOSIS — E118 Type 2 diabetes mellitus with unspecified complications: Secondary | ICD-10-CM

## 2022-08-13 DIAGNOSIS — E1169 Type 2 diabetes mellitus with other specified complication: Secondary | ICD-10-CM

## 2022-08-13 NOTE — Telephone Encounter (Signed)
Please schedule physical with me which had to be rescheduled

## 2022-08-15 ENCOUNTER — Other Ambulatory Visit: Payer: HMO

## 2022-08-15 NOTE — Telephone Encounter (Signed)
Lvmtcb

## 2022-08-22 ENCOUNTER — Encounter: Payer: HMO | Admitting: Family Medicine

## 2022-08-24 ENCOUNTER — Other Ambulatory Visit (HOSPITAL_COMMUNITY): Payer: Self-pay

## 2022-08-24 ENCOUNTER — Other Ambulatory Visit: Payer: Self-pay | Admitting: Family Medicine

## 2022-08-24 DIAGNOSIS — E118 Type 2 diabetes mellitus with unspecified complications: Secondary | ICD-10-CM

## 2022-08-24 DIAGNOSIS — I10 Essential (primary) hypertension: Secondary | ICD-10-CM

## 2022-08-24 DIAGNOSIS — R600 Localized edema: Secondary | ICD-10-CM

## 2022-08-24 DIAGNOSIS — E113513 Type 2 diabetes mellitus with proliferative diabetic retinopathy with macular edema, bilateral: Secondary | ICD-10-CM

## 2022-08-24 DIAGNOSIS — Z794 Long term (current) use of insulin: Secondary | ICD-10-CM

## 2022-08-24 DIAGNOSIS — K219 Gastro-esophageal reflux disease without esophagitis: Secondary | ICD-10-CM

## 2022-08-24 DIAGNOSIS — E1169 Type 2 diabetes mellitus with other specified complication: Secondary | ICD-10-CM

## 2022-08-24 DIAGNOSIS — F331 Major depressive disorder, recurrent, moderate: Secondary | ICD-10-CM

## 2022-08-25 ENCOUNTER — Ambulatory Visit (INDEPENDENT_AMBULATORY_CARE_PROVIDER_SITE_OTHER): Payer: PPO

## 2022-08-25 VITALS — Ht 61.0 in | Wt 175.0 lb

## 2022-08-25 DIAGNOSIS — Z0000 Encounter for general adult medical examination without abnormal findings: Secondary | ICD-10-CM | POA: Diagnosis not present

## 2022-08-25 MED ORDER — LOSARTAN POTASSIUM 25 MG PO TABS
25.0000 mg | ORAL_TABLET | Freq: Every day | ORAL | 0 refills | Status: DC
  Filled 2022-08-25 – 2022-09-09 (×2): qty 90, 90d supply, fill #0

## 2022-08-25 MED ORDER — OMEPRAZOLE 40 MG PO CPDR
40.0000 mg | DELAYED_RELEASE_CAPSULE | Freq: Two times a day (BID) | ORAL | 0 refills | Status: DC
  Filled 2022-08-25 – 2022-09-09 (×2): qty 180, 90d supply, fill #0

## 2022-08-25 MED ORDER — SPIRONOLACTONE 25 MG PO TABS
12.5000 mg | ORAL_TABLET | Freq: Every day | ORAL | 0 refills | Status: DC
Start: 2022-08-25 — End: 2022-12-16
  Filled 2022-08-25 – 2022-11-19 (×4): qty 45, 90d supply, fill #0

## 2022-08-25 MED ORDER — PROPRANOLOL HCL 20 MG PO TABS
20.0000 mg | ORAL_TABLET | Freq: Two times a day (BID) | ORAL | 0 refills | Status: DC
Start: 2022-08-25 — End: 2022-11-19
  Filled 2022-08-25 – 2022-10-05 (×6): qty 180, 90d supply, fill #0

## 2022-08-25 MED ORDER — NOVOLIN R FLEXPEN 100 UNIT/ML IJ SOPN
50.0000 [IU] | PEN_INJECTOR | Freq: Three times a day (TID) | INTRAMUSCULAR | 1 refills | Status: DC
Start: 2022-08-25 — End: 2022-12-16
  Filled 2022-08-25: qty 45, 30d supply, fill #0
  Filled 2022-12-13: qty 45, 30d supply, fill #1

## 2022-08-25 MED ORDER — FREESTYLE LIBRE 2 SENSOR MISC
0 refills | Status: DC
Start: 2022-08-25 — End: 2022-10-05
  Filled 2022-08-25 – 2022-09-09 (×2): qty 6, 84d supply, fill #0

## 2022-08-25 MED ORDER — VENLAFAXINE HCL ER 75 MG PO CP24
75.0000 mg | ORAL_CAPSULE | Freq: Every day | ORAL | 11 refills | Status: DC
  Filled 2022-08-25 – 2022-09-09 (×2): qty 30, 30d supply, fill #0
  Filled 2022-10-17 (×2): qty 30, 30d supply, fill #1

## 2022-08-25 MED ORDER — ROSUVASTATIN CALCIUM 10 MG PO TABS
10.0000 mg | ORAL_TABLET | Freq: Every day | ORAL | 0 refills | Status: DC
Start: 2022-08-25 — End: 2022-12-16
  Filled 2022-08-25 – 2022-11-02 (×5): qty 90, 90d supply, fill #0

## 2022-08-25 MED ORDER — KLOR-CON SPRINKLE 10 MEQ PO CPCR
10.0000 meq | ORAL_CAPSULE | Freq: Every day | ORAL | 1 refills | Status: DC | PRN
  Filled 2022-08-25 – 2022-09-23 (×2): qty 30, 30d supply, fill #0

## 2022-08-25 MED ORDER — TRESIBA FLEXTOUCH 200 UNIT/ML ~~LOC~~ SOPN
100.0000 [IU] | PEN_INJECTOR | Freq: Every day | SUBCUTANEOUS | 1 refills | Status: DC
Start: 2022-08-25 — End: 2022-10-05
  Filled 2022-08-25: qty 18, 36d supply, fill #0

## 2022-08-25 MED ORDER — METFORMIN HCL ER 750 MG PO TB24
750.0000 mg | ORAL_TABLET | Freq: Two times a day (BID) | ORAL | 0 refills | Status: DC
Start: 2022-08-25 — End: 2022-10-05
  Filled 2022-08-25 – 2022-09-09 (×2): qty 180, 90d supply, fill #0

## 2022-08-25 MED ORDER — FUROSEMIDE 20 MG PO TABS
20.0000 mg | ORAL_TABLET | Freq: Every day | ORAL | 1 refills | Status: DC | PRN
  Filled 2022-08-25 – 2022-09-19 (×2): qty 30, 30d supply, fill #0

## 2022-08-25 NOTE — Patient Instructions (Addendum)
Karen [name] , Thank you for taking time to come for your Medicare Wellness Visit. I appreciate your ongoing commitment to your health goals. Please review the following plan we discussed and let me know if I can assist you in the future.   These are the goals we discussed:  Goals      Patient Stated     Maintain health.        This is a list of the screening recommended for you and due dates:  Health Maintenance  Topic Date Due   Zoster (Shingles) Vaccine (1 of 2) Never done   Colon Cancer Screening  04/24/2020   Mammogram  02/06/2021   COVID-19 Vaccine (4 - 2023-24 season) 01/14/2022   Pap Smear  02/06/2022   Eye exam for diabetics  03/10/2022   Yearly kidney health urinalysis for diabetes  08/13/2022   Hemoglobin A1C  10/12/2022   Yearly kidney function blood test for diabetes  12/01/2022   Complete foot exam   12/01/2022   Flu Shot  12/15/2022   Medicare Annual Wellness Visit  08/25/2023   DTaP/Tdap/Td vaccine (2 - Td or Tdap) 07/27/2025   Hepatitis C Screening: USPSTF Recommendation to screen - Ages 73-79 yo.  Completed   HIV Screening  Completed   HPV Vaccine  Aged Out    Advanced directives: Advance directive discussed with you today. Even though you declined this today, please call our office should you change your mind, and we can give you the proper paperwork for you to fill out.   Conditions/risks identified: Aim for 30 minutes of exercise or brisk walking, 6-8 glasses of water, and 5 servings of fruits and vegetables each day.   Next appointment: Follow up in one year for your annual wellness visit. 08/29/23 @ 1:30  Preventive Care 40-64 Years, Female Preventive care refers to lifestyle choices and visits with your health care provider that can promote health and wellness. What does preventive care include? A yearly physical exam. This is also called an annual well check. Dental exams once or twice a year. Routine eye exams. Ask your health care provider how often  you should have your eyes checked. Personal lifestyle choices, including: Daily care of your teeth and gums. Regular physical activity. Eating a healthy diet. Avoiding tobacco and drug use. Limiting alcohol use. Practicing safe sex. Taking low-dose aspirin daily starting at age 48. Taking vitamin and mineral supplements as recommended by your health care provider. What happens during an annual well check? The services and screenings done by your health care provider during your annual well check will depend on your age, overall health, lifestyle risk factors, and family history of disease. Counseling  Your health care provider may ask you questions about your: Alcohol use. Tobacco use. Drug use. Emotional well-being. Home and relationship well-being. Sexual activity. Eating habits. Work and work Astron[employer]. Method of birth control. Menstrual cycle. Pregnancy history. Screening  You may have the following tests or measurements: Height, weight, and BMI. Blood pressure. Lipid and cholesterol levels. These may be checked every 5 years, or more frequently if you are over 87 years old. Skin check. Lung cancer screening. You may have this screening every year starting at age 68 if you have a 30-pack-year history of smoking and currently smoke or have quit within the past 15 years. Fecal occult blood test (FOBT) of the stool. You may have this test every year starting at age 48. Flexible sigmoidoscopy or colonoscopy. You may have a sigmoidoscopy every  5 years or a colonoscopy every 10 years starting at age 25. Hepatitis C blood test. Hepatitis B blood test. Sexually transmitted disease (STD) testing. Diabetes screening. This is done by checking your blood sugar (glucose) after you have not eaten for a while (fasting). You may have this done every 1-3 years. Mammogram. This may be done every 1-2 years. Talk to your health care provider about when you should start having regular  mammograms. This may depend on whether you have a family history of breast cancer. BRCA-related cancer screening. This may be done if you have a family history of breast, ovarian, tubal, or peritoneal cancers. Pelvic exam and Pap test. This may be done every 3 years starting at age 23. Starting at age 56, this may be done every 5 years if you have a Pap test in combination with an HPV test. Bone density scan. This is done to screen for osteoporosis. You may have this scan if you are at high risk for osteoporosis. Discuss your test results, treatment options, and if necessary, the need for more tests with your health care provider. Vaccines  Your health care provider may recommend certain vaccines, such as: Influenza vaccine. This is recommended every year. Tetanus, diphtheria, and acellular pertussis (Tdap, Td) vaccine. You may need a Td booster every 10 years. Zoster vaccine. You may need this after age 46. Pneumococcal 13-valent conjugate (PCV13) vaccine. You may need this if you have certain conditions and were not previously vaccinated. Pneumococcal polysaccharide (PPSV23) vaccine. You may need one or two doses if you smoke cigarettes or if you have certain conditions. Talk to your health care provider about which screenings and vaccines you need and how often you need them. This information is not intended to replace advice given to you by your health care provider. Make sure you discuss any questions you have with your health care provider. Document Released: 05/29/2015 Document Revised: 01/20/2016 Document Reviewed: 03/03/2015 Elsevier Interactive Patient Education  2017 [employer].    Fall Prevention in the Home Falls can cause injuries. They can happen to people of all ages. There are many things you can do to make your home safe and to help prevent falls. What can I do on the outside of my home? Regularly fix the edges of walkways and driveways and fix any cracks. Remove anything  that might make you trip as you walk through a door, such as a raised step or threshold. Trim any bushes or trees on the path to your home. Use bright outdoor lighting. Clear any walking paths of anything that might make someone trip, such as rocks or tools. Regularly check to see if handrails are loose or broken. Make sure that both sides of any steps have handrails. Any raised decks and porches should have guardrails on the edges. Have any leaves, snow, or ice cleared regularly. Use sand or salt on walking paths during winter. Clean up any spills in your garage right away. This includes oil or grease spills. What can I do in the bathroom? Use night lights. Install grab bars by the toilet and in the tub and shower. Do not use towel bars as grab bars. Use non-skid mats or decals in the tub or shower. If you need to sit down in the shower, use a plastic, non-slip stool. Keep the floor dry. Clean up any water that spills on the floor as soon as it happens. Remove soap buildup in the tub or shower regularly. Attach bath mats securely with  double-sided non-slip rug tape. Do not have throw rugs and other things on the floor that can make you trip. What can I do in the bedroom? Use night lights. Make sure that you have a light by your bed that is easy to reach. Do not use any sheets or blankets that are too big for your bed. They should not hang down onto the floor. Have a firm chair that has side arms. You can use this for support while you get dressed. Do not have throw rugs and other things on the floor that can make you trip. What can I do in the kitchen? Clean up any spills right away. Avoid walking on wet floors. Keep items that you use a lot in easy-to-reach places. If you need to reach something above you, use a strong step stool that has a grab bar. Keep electrical cords out of the way. Do not use floor polish or wax that makes floors slippery. If you must use wax, use non-skid floor  wax. Do not have throw rugs and other things on the floor that can make you trip. What can I do with my stairs? Do not leave any items on the stairs. Make sure that there are handrails on both sides of the stairs and use them. Fix handrails that are broken or loose. Make sure that handrails are as long as the stairways. Check any carpeting to make sure that it is firmly attached to the stairs. Fix any carpet that is loose or worn. Avoid having throw rugs at the top or bottom of the stairs. If you do have throw rugs, attach them to the floor with carpet tape. Make sure that you have a light switch at the top of the stairs and the bottom of the stairs. If you do not have them, ask someone to add them for you. What else can I do to help prevent falls? Wear shoes that: Do not have high heels. Have rubber bottoms. Are comfortable and fit you well. Are closed at the toe. Do not wear sandals. If you use a stepladder: Make sure that it is fully opened. Do not climb a closed stepladder. Make sure that both sides of the stepladder are locked into place. Ask someone to hold it for you, if possible. Clearly mark and make sure that you can see: Any grab bars or handrails. First and last steps. Where the edge of each step is. Use tools that help you move around (mobility aids) if they are needed. These include: Canes. Walkers. Scooters. Crutches. Turn on the lights when you go into a dark area. Replace any light bulbs as soon as they burn out. Set up your furniture so you have a clear path. Avoid moving your furniture around. If any of your floors are uneven, fix them. If there are any pets around you, be aware of where they are. Review your medicines with your doctor. Some medicines can make you feel dizzy. This can increase your chance of falling. Ask your doctor what other things that you can do to help prevent falls. This information is not intended to replace advice given to you by your  health care provider. Make sure you discuss any questions you have with your health care provider. Document Released: 02/26/2009 Document Revised: 10/08/2015 Document Reviewed: 06/06/2014 Elsevier Interactive Patient Education  2017 [employer].

## 2022-08-25 NOTE — Progress Notes (Signed)
Subjective:   Karen [name] is a 67 y.o. female who presents for Medicare Annual (Subsequent) preventive examination.  Review of Systems      Cardiac Risk Factors include: advanced age (>11men, >12 women);hypertension;diabetes mellitus;sedentary lifestyle     Objective:    Today's Vitals   08/25/22 1313  Weight: 175 lb (79.4 kg)  Height: 5\' 1"  (1.549 m)   Body mass index is 33.07 kg/m.     08/25/2022    1:31 PM 09/26/2021    1:58 AM 01/30/2021    9:44 PM 12/10/2020    8:16 PM 11/02/2018    9:06 PM 01/10/2018    7:04 PM 02/07/2017    7:42 AM  Advanced Directives  Does Patient Have a Medical Advance Directive? No No No No No No No  Would patient like information on creating a medical advance directive? No - Patient declined   No - Patient declined No - Patient declined No - Patient declined     Current Medications (verified) Outpatient Encounter Medications as of 08/25/2022  Medication Sig   acetaminophen (TYLENOL) 500 MG tablet Take 1,000 mg by mouth 3 (three) times daily as needed for mild pain.   aspirin EC 81 MG EC tablet Take 1 tablet (81 mg total) by mouth daily. (Patient taking differently: Take 81 mg by mouth at bedtime.)   Continuous Blood Gluc Receiver (FREESTYLE LIBRE 2 READER) DEVI Use as directed to check sugars 3-4 times daily   Continuous Blood Gluc Sensor (FREESTYLE LIBRE 2 SENSOR) MISC Use as directed to check blood sugars 3-4 times daily   dicyclomine (BENTYL) 20 MG tablet Take 1 tablet (20 mg total) by mouth 2 (two) times daily as needed for spasms. (Patient taking differently: Take 20 mg by mouth as needed for spasms.)   furosemide (LASIX) 20 MG tablet Take 1 tablet (20 mg total) by mouth daily as needed for edema.   gabapentin (NEURONTIN) 300 MG capsule TAKE ONE CAPSULE BY MOUTH AT BEDTIME   Insulin Pen Needle (PEN NEEDLES) 31G X 8 MM MISC Use to inject Lantus as directed twice daily. Dx: E11.40   Insulin Regular Human (NOVOLIN R FLEXPEN RELION) 100 UNIT/ML  KwikPen Inject 50 Units as directed 3 (three) times daily before meals. + SSI 5 units for every 50 units over 250   KLOR-CON SPRINKLE 10 MEQ CR capsule Take 1 capsule (10 mEq total) by mouth daily as needed. When taking Lasix (Patient taking differently: Take 10 mEq by mouth daily as needed (when taking furosemide).)   losartan (COZAAR) 25 MG tablet TAKE ONE TABLET BY MOUTH EVERY DAY   metFORMIN (GLUCOPHAGE-XR) 750 MG 24 hr tablet Take 1 tablet (750 mg total) by mouth in the morning and at bedtime.   nystatin cream (MYCOSTATIN) Apply 1 Application topically 2 (two) times daily.   omeprazole (PRILOSEC) 40 MG capsule Take 1 capsule (40 mg total) by mouth 2 (two) times daily.   PENTIPS 31G X 6 MM MISC Use to inject Lantus as directed twice daily. Dx E11.40   polyethylene glycol (MIRALAX / GLYCOLAX) 17 g packet Take 17 g by mouth daily as needed for mild constipation.   propranolol (INDERAL) 20 MG tablet TAKE ONE TABLET BY MOUTH TWICE DAILY   rosuvastatin (CRESTOR) 10 MG tablet TAKE ONE TABLET BY MOUTH EVERY DAY   Semaglutide, 1 MG/DOSE, 4 MG/3ML SOPN Inject 1 mg as directed once a week. (Patient taking differently: Inject 1 mg as directed every Sunday.)   spironolactone (ALDACTONE) 25  MG tablet TAKE 1/2 TABLET BY MOUTH EVERY DAY   TRESIBA FLEXTOUCH 200 UNIT/ML FlexTouch Pen INJECT 100 units into THE SKIN EVERY DAY   venlafaxine XR (EFFEXOR XR) 75 MG 24 hr capsule Take 1 capsule (75 mg total) by mouth daily with breakfast.   lidocaine (HM LIDOCAINE PATCH) 4 % Place 1 patch onto the skin every 12 (twelve) hours as needed. (Patient not taking: Reported on 08/25/2022)   mupirocin ointment (BACTROBAN) 2 % Apply 1 application topically 2 (two) times daily. (Patient not taking: Reported on 08/25/2022)   nitroGLYCERIN (NITROSTAT) 0.4 MG SL tablet Place 1 tablet (0.4 mg total) under the tongue every 5 (five) minutes as needed for chest pain. (Patient not taking: Reported on 08/25/2022)   NOVOLIN R 100 UNIT/ML  injection INJECT 0.25ML (25 UNITS INTO THE SKIN THREE TIMES DAILY BEFORE MEALS PLUS SLIDING SCALE FOR EVERY 50 OVER 200   promethazine (PHENERGAN) 25 MG tablet Take 0.5-1 tablets (12.5-25 mg total) by mouth 2 (two) times daily as needed for nausea. (Patient not taking: Reported on 08/25/2022)   No facility-administered encounter medications on file as of 08/25/2022.    Allergies (verified) Clindamycin/lincomycin, Erythromycin, Lipitor [atorvastatin], and Penicillins   History: Past Medical History:  Diagnosis Date   Body aches 03/24/2016   Resolved off simvastatin.    CAD (coronary artery disease)    STEMI 07/2013 - treated with PTCA of the RCA followed by DES to the intermediate ramus  //  Myoview 5/17 - Normal perfusion. LVEF 71% with normal wall motion. This is a low risk study.   Chronic bronchitis    Cirrhosis    COVID-19 virus infection 11/20/2020   Depression    Diabetes    GERD (gastroesophageal reflux disease)    High blood pressure    History of chicken pox    History of ovarian cyst    HLD (hyperlipidemia)    Seasonal allergies    Seizure 1977   off AEDs   SVT (supraventricular tachycardia) 10/2015   s/p catheter ablation   Syncopal episodes    Tachyarrhythmia    ER visits with adenosine push to control   Tubal pregnancy    Type 2 diabetes, uncontrolled, with neuropathy    dx 1993   Past Surgical History:  Procedure Laterality Date   ABLATION OF DYSRHYTHMIC FOCUS  11/06/2015   SVT   CARDIAC CATHETERIZATION  07/2013   angioplasty to RCA and DES to ramus intermedius   COLONOSCOPY WITH ESOPHAGOGASTRODUODENOSCOPY (EGD)  04/2019   esophageal varices II, portal hypertensive gastritis, gastric polyp, H pylori neg, 12 TAs, rpt colonoscopy in 1 yr ([facility])   DILATION AND CURETTAGE OF UTERUS  1984   ECTOPIC PREGNANCY SURGERY Right 1984   tube removal 2/2 hemorrhage   ELECTROPHYSIOLOGIC STUDY N/A 11/06/2015   SVT Ablation;  Marinus MawGregg W Taylor, MD;  [facility]  LAB   LEFT HEART CATHETERIZATION WITH CORONARY ANGIOGRAM N/A 07/23/2013   Procedure: LEFT HEART CATHETERIZATION WITH CORONARY ANGIOGRAM;  Surgeon: Lesleigh NoeHenry W Smith III, MD;  [facility];  Service: Cardiovascular;  Laterality: N/A;   OVARIAN CYST SURGERY  1998   PERCUTANEOUS CORONARY STENT INTERVENTION (PCI-S) N/A 07/24/2013   Procedure: PERCUTANEOUS CORONARY STENT INTERVENTION (PCI-S);  Surgeon: Lesleigh NoeHenry W Smith III, MD;  [facility];  Service: Cardiovascular;  Laterality: N/A;   ROTATOR CUFF REPAIR  2006   Right   SHOULDER ARTHROSCOPY Left 02/2017   LEFT shoulder arthroscopy with limited debridement, decompression/acromioplasty, open rotator  cuff repair and biceps tenodesis ([facility])   SUBMANDIBULAR DUCT LIGATION Right 10/2014   R submandibular gland stone removed [facility])   TONSILLECTOMY AND ADENOIDECTOMY  1965   Family History  Problem Relation Age of Onset   Diabetes Mother    Hyperlipidemia Mother    Hypertension Mother    Stroke Mother    Coronary artery disease Mother        CABG   Heart disease Mother    Diabetes Father    Hypertension Father    Hyperlipidemia Father    Coronary artery disease Father        CABG   Congestive Heart Failure Father    Pancreatic cancer Father    Arrhythmia Brother    Diabetes Brother    Diabetes Maternal Grandmother    Cirrhosis Maternal Grandmother    Diabetes Maternal Grandfather    Diabetes Paternal Grandmother    Diabetes Paternal Grandfather    Heart disease Brother    Diabetes Brother    Diabetes Sister    Breast cancer Paternal Aunt    Kidney disease Neg Hx    Social History   Socioeconomic History   Marital status: Widowed    Spouse name: Not on file   Number of children: 0   Years of education: 12th grade   Highest education level: Not on file  Occupational History   Occupation: ALF, [employer], med tech/PCA    Employer: [employer]  Tobacco Use   Smoking status: Never   Smokeless tobacco: Never   Vaping Use   Vaping Use: Never used  Substance and Sexual Activity   Alcohol use: No   Drug use: No   Sexual activity: Not Currently  Other Topics Concern   Not on file  Social History Narrative   Caffeine: 6 diet sodas/day   Lives with 2 dogs.   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   Activity: no regular exercise   Diet: some water, fruits/vegetables daily    Social Determinants of Health   Financial Resource Strain: Low Risk  (08/25/2022)   Overall Financial Resource Strain (CARDIA)    Difficulty of Paying Living Expenses: Not hard at all  Food Insecurity: No Food Insecurity (08/25/2022)   Hunger Vital Sign    Worried About Running Out of Food in the Last Year: Never true    Ran Out of Food in the Last Year: Never true  Transportation Needs: No Transportation Needs (08/25/2022)   PRAPARE - Administrator, Civil Service (Medical): No    Lack of Transportation (Non-Medical): No  Physical Activity: Inactive (08/25/2022)   Exercise Vital Sign    Days of Exercise per Week: 0 days    Minutes of Exercise per Session: 0 min  Stress: No Stress Concern Present (08/25/2022)   [employer] of Occupational Health - Occupational Stress Questionnaire    Feeling of Stress : Not at all  Social Connections: Moderately Isolated (08/25/2022)   Social Connection and Isolation Panel [NHANES]    Frequency of Communication with Friends and Family: More than three times a week    Frequency of Social Gatherings with Friends and Family: More than three times a week    Attends Religious Services: More than 4 times per year    Active Member of [employer] or Organizations: No    Attends Banker Meetings: Never    Marital Status: Widowed    Tobacco Counseling Counseling given: Not Answered  Clinical Intake:  Pre-visit preparation completed: Yes  Pain : No/denies pain     Nutritional Risks: None Diabetes: Yes CBG done?: Yes CBG resulted in Enter/  Edit results?: No Did pt. bring in CBG monitor from home?: No  How often do you need to have someone help you when you read instructions, pamphlets, or other written materials from your doctor or pharmacy?: 1 - Never  Diabetic?Nutrition Risk Assessment:  Has the patient had any N/V/D within the last 2 months?  No  Does the patient have any non-healing wounds?  No  Has the patient had any unintentional weight loss or weight gain?  No   Diabetes:  Is the patient diabetic?  Yes  If diabetic, was a CBG obtained today?  Yes , 96 per pt. Did the patient bring in their glucometer from home?  No  How often do you monitor your CBG's? Continuous monitor.   Financial Strains and Diabetes Management:  Are you having any financial strains with the device, your supplies or your medication? No .  Does the patient want to be seen by Chronic Care Management for management of their diabetes?  No  Would the patient like to be referred to a Nutritionist or for Diabetic Management?  No   Diabetic Exams:  Diabetic Eye Exam: Completed [facility]  03/10/21 pt will call for appointment. Diabetic Foot Exam: Completed 11/30/21  Podiatrist  Interpreter Needed?: No  Information entered by :: C.Kylea Berrong LPN   Activities of Daily Living    08/25/2022    1:32 PM  In your present state of health, do you have any difficulty performing the following activities:  Hearing? 0  Vision? 1  Comment Left eye  Difficulty concentrating or making decisions? 0  Walking or climbing stairs? 1  Comment Hard to climb but difficult with knee pain  Dressing or bathing? 0  Doing errands, shopping? 0  Preparing Food and eating ? N  Using the Toilet? N  In the past six months, have you accidently leaked urine? N  Do you have problems with loss of bowel control? N  Managing your Medications? N  Managing your Finances? N  Housekeeping or managing your Housekeeping? N    Patient Care Team: Eustaquio Boyden, MD  as PCP - General (Family Medicine) Lyn Records, MD (Inactive) as PCP - Cardiology (Cardiology) Kathyrn Sheriff, [facility] as Pharmacist (Pharmacist)  Indicate any recent Medical Services you may have received from other than [facility] providers in the past year (date may be approximate).     Assessment:   This is a routine wellness examination for Karen [name].  Hearing/Vision screen Hearing Screening - Comments:: No aids Vision Screening - Comments:: Glasses - [facility]  Dietary issues and exercise activities discussed: Current Exercise Habits: The patient does not participate in regular exercise at present, Exercise limited by: None identified   Goals Addressed             This Visit's Progress    Patient Stated       Maintain health.       Depression Screen    08/25/2022    1:31 PM 08/16/2021   11:47 AM 07/10/2020    3:19 PM  PHQ 2/9 Scores  PHQ - 2 Score 0 1 0  PHQ- 9 Score  4 2    Fall Risk    08/25/2022    1:32 PM 08/16/2021   10:38 AM 07/10/2020    3:00 PM 10/28/2019   12:20 PM  Fall Risk   Falls in the past year? 0 1 0 0  Comment  Tripped    Number falls in past yr: 0 0  0  Injury with Fall? 0 0  0  Risk for fall due to : No Fall Risks     Follow up Falls prevention discussed;Falls evaluation completed   Falls evaluation completed    FALL RISK PREVENTION PERTAINING TO THE HOME:  Any stairs in or around the home? No  If so, are there any without handrails? No  Home free of loose throw rugs in walkways, pet beds, electrical cords, etc? Yes  Adequate lighting in your home to reduce risk of falls? Yes   ASSISTIVE DEVICES UTILIZED TO PREVENT FALLS:  Life alert? No  Use of a cane, walker or w/c? No  Grab bars in the bathroom? Yes  Shower chair or bench in shower? No  Elevated toilet seat or a handicapped toilet? No    Cognitive Function:        08/25/2022    1:34 PM  6CIT Screen  What Year? 0 points  What month? 0 points  What time? 0 points   Count back from 20 0 points  Months in reverse 0 points  Repeat phrase 0 points  Total Score 0 points    Immunizations Immunization History  Administered Date(s) Administered   Hep A / Hep B 12/30/2020   Hepatitis A, Adult 07/05/2021   Hepatitis B, PED/ADOLESCENT 07/05/2021, 07/05/2021   Influenza,inj,Quad PF,6+ Mos 06/10/2013, 02/06/2014, 07/02/2015, 03/24/2016, 05/17/2017, 05/31/2018, 04/10/2019, 03/11/2020, 02/23/2021, 04/13/2022   PFIZER(Purple Top)SARS-COV-2 Vaccination 08/13/2019, 09/04/2019, 04/15/2020   PNEUMOCOCCAL CONJUGATE-20 10/26/2020   PPD Test 08/30/2010, 10/05/2010, 11/18/2011   Pneumococcal Polysaccharide-23 06/10/2013   Rabies, IM 07/28/2015, 07/31/2015, 08/04/2015, 08/11/2015   Tdap 07/28/2015    TDAP status: Up to date  Flu Vaccine status: Up to date  Pneumococcal vaccine status: Up to date  Covid-19 vaccine status: Information provided on how to obtain vaccines.   Qualifies for Shingles Vaccine? Yes   Zostavax completed No   Shingrix Completed?: No.    Education has been provided regarding the importance of this vaccine. Patient has been advised to call insurance company to determine out of pocket expense if they have not yet received this vaccine. Advised may also receive vaccine at local pharmacy or Health Dept. Verbalized acceptance and understanding.  Screening Tests Health Maintenance  Topic Date Due   Zoster Vaccines- Shingrix (1 of 2) Never done   COLONOSCOPY (Pts 45-86yrs Insurance coverage will need to be confirmed)  04/24/2020   MAMMOGRAM  02/06/2021   COVID-19 Vaccine (4 - 2023-24 season) 01/14/2022   PAP SMEAR-Modifier  02/06/2022   OPHTHALMOLOGY EXAM  03/10/2022   Diabetic kidney evaluation - Urine ACR  08/13/2022   HEMOGLOBIN A1C  10/12/2022   Diabetic kidney evaluation - eGFR measurement  12/01/2022   FOOT EXAM  12/01/2022   INFLUENZA VACCINE  12/15/2022   Medicare Annual Wellness (AWV)  08/25/2023   DTaP/Tdap/Td (2 - Td or Tdap)  07/27/2025   Hepatitis C Screening  Completed   HIV Screening  Completed   HPV VACCINES  Aged Out    Health Maintenance  Health Maintenance Due  Topic Date Due   Zoster Vaccines- Shingrix (1 of 2) Never done   COLONOSCOPY (Pts 45-48yrs Insurance coverage will need to be confirmed)  04/24/2020   MAMMOGRAM  02/06/2021   COVID-19 Vaccine (4 - 2023-24 season) 01/14/2022   PAP SMEAR-Modifier  02/06/2022  OPHTHALMOLOGY EXAM  03/10/2022   Diabetic kidney evaluation - Urine ACR  08/13/2022    Colorectal cancer screening: Type of screening: Colonoscopy. Completed 04/25/19. Repeat every 1 years Has appointment 09/12/22  Mammogram status: Completed 02/07/19. Repeat every year Pt will schedule.    Lung Cancer Screening: (Low Dose CT Chest recommended if Age 10-80 years, 30 pack-year currently smoking OR have quit w/in 15years.) does not qualify.   Lung Cancer Screening Referral: no  Additional Screening:  Hepatitis C Screening: does qualify; Completed 12/08/20  Vision Screening: Recommended annual ophthalmology exams for early detection of glaucoma and other disorders of the eye. Is the patient up to date with their annual eye exam?  No  Who is the provider or what is the name of the office in which the patient attends annual eye exams? [facility] If pt is not established with a provider, would they like to be referred to a provider to establish care? No .   Dental Screening: Recommended annual dental exams for proper oral hygiene  Community Resource Referral / Chronic Care Management: CRR required this visit?  No   CCM required this visit?  No      Plan:     I have personally reviewed and noted the following in the patient's chart:   Medical and social history Use of alcohol, tobacco or illicit drugs  Current medications and supplements including opioid prescriptions. Patient is not currently taking opioid prescriptions. Functional ability and status Nutritional  status Physical activity Advanced directives List of other physicians Hospitalizations, surgeries, and ER visits in previous 12 months Vitals Screenings to include cognitive, depression, and falls Referrals and appointments  In addition, I have reviewed and discussed with patient certain preventive protocols, quality metrics, and best practice recommendations. A written personalized care plan for preventive services as well as general preventive health recommendations were provided to patient.     Maryan Puls, LPN   6/72/0947   Nurse Notes: none

## 2022-08-25 NOTE — Telephone Encounter (Signed)
Message from pharmacy:  Pt would like to switch pharmacies and is wanting to transfer to [facility]. Pt's current pharmacy has refused to transfer rx's in the past and she asked [place] to request new rx's   Spoke with pt asking about change in pharmacies. Pt confirms [name] is no longer a preferred pharmacy with her ins co. She now uses [facility]. Requests [name] be removed from chart and new rxs selected below be sent in.   Last OV: 04/13/22, DM f/u Next OV:  10/10/22, CPE

## 2022-08-26 ENCOUNTER — Other Ambulatory Visit (HOSPITAL_COMMUNITY): Payer: Self-pay

## 2022-08-26 ENCOUNTER — Other Ambulatory Visit: Payer: Self-pay

## 2022-08-26 MED ORDER — GABAPENTIN 300 MG PO CAPS
300.0000 mg | ORAL_CAPSULE | Freq: Every day | ORAL | 1 refills | Status: DC
  Filled 2022-08-26 – 2022-09-19 (×2): qty 90, 90d supply, fill #0
  Filled 2022-12-13: qty 90, 90d supply, fill #1

## 2022-08-26 NOTE — Telephone Encounter (Signed)
ERx 

## 2022-08-29 ENCOUNTER — Other Ambulatory Visit: Payer: Self-pay

## 2022-08-29 NOTE — Progress Notes (Signed)
I connected with  Antony Contras on 08/29/22 by a audio enabled telemedicine application and verified that I am speaking with the correct person using two identifiers.  Patient Location: Home  Provider Location: Office/Clinic  I discussed the limitations of evaluation and management by telemedicine. The patient expressed understanding and agreed to proceed.  Subjective:   Karen [name] is a 67 y.o. female who presents for Medicare Annual (Subsequent) preventive examination.  Review of Systems      Cardiac Risk Factors include: advanced age (>43men, >78 women);hypertension;diabetes mellitus;sedentary lifestyle     Objective:    Today's Vitals   08/25/22 1313  Weight: 175 lb (79.4 kg)  Height: 5\' 1"  (1.549 m)   Body mass index is 33.07 kg/m.     08/25/2022    1:31 PM 09/26/2021    1:58 AM 01/30/2021    9:44 PM 12/10/2020    8:16 PM 11/02/2018    9:06 PM 01/10/2018    7:04 PM 02/07/2017    7:42 AM  Advanced Directives  Does Patient Have a Medical Advance Directive? No No No No No No No  Would patient like information on creating a medical advance directive? No - Patient declined   No - Patient declined No - Patient declined No - Patient declined     Current Medications (verified) Outpatient Encounter Medications as of 08/25/2022  Medication Sig   acetaminophen (TYLENOL) 500 MG tablet Take 1,000 mg by mouth 3 (three) times daily as needed for mild pain.   aspirin EC 81 MG EC tablet Take 1 tablet (81 mg total) by mouth daily. (Patient taking differently: Take 81 mg by mouth at bedtime.)   Continuous Blood Gluc Receiver (FREESTYLE LIBRE 2 READER) DEVI Use as directed to check sugars 3-4 times daily   dicyclomine (BENTYL) 20 MG tablet Take 1 tablet (20 mg total) by mouth 2 (two) times daily as needed for spasms. (Patient taking differently: Take 20 mg by mouth as needed for spasms.)   Insulin Pen Needle (PEN NEEDLES) 31G X 8 MM MISC Use to inject Lantus as directed twice daily. Dx:  E11.40   nystatin cream (MYCOSTATIN) Apply 1 Application topically 2 (two) times daily.   PENTIPS 31G X 6 MM MISC Use to inject Lantus as directed twice daily. Dx E11.40   polyethylene glycol (MIRALAX / GLYCOLAX) 17 g packet Take 17 g by mouth daily as needed for mild constipation.   Semaglutide, 1 MG/DOSE, 4 MG/3ML SOPN Inject 1 mg as directed once a week. (Patient taking differently: Inject 1 mg as directed every Sunday.)   [DISCONTINUED] Continuous Blood Gluc Sensor (FREESTYLE LIBRE 2 SENSOR) MISC Use as directed to check blood sugars 3-4 times daily   [DISCONTINUED] furosemide (LASIX) 20 MG tablet Take 1 tablet (20 mg total) by mouth daily as needed for edema.   [DISCONTINUED] gabapentin (NEURONTIN) 300 MG capsule TAKE ONE CAPSULE BY MOUTH AT BEDTIME   [DISCONTINUED] Insulin Regular Human (NOVOLIN R FLEXPEN RELION) 100 UNIT/ML KwikPen Inject 50 Units as directed 3 (three) times daily before meals. + SSI 5 units for every 50 units over 250   [DISCONTINUED] KLOR-CON SPRINKLE 10 MEQ CR capsule Take 1 capsule (10 mEq total) by mouth daily as needed. When taking Lasix (Patient taking differently: Take 10 mEq by mouth daily as needed (when taking furosemide).)   [DISCONTINUED] losartan (COZAAR) 25 MG tablet TAKE ONE TABLET BY MOUTH EVERY DAY   [DISCONTINUED] metFORMIN (GLUCOPHAGE-XR) 750 MG 24 hr tablet Take 1 tablet (750  mg total) by mouth in the morning and at bedtime.   [DISCONTINUED] omeprazole (PRILOSEC) 40 MG capsule Take 1 capsule (40 mg total) by mouth 2 (two) times daily.   [DISCONTINUED] propranolol (INDERAL) 20 MG tablet TAKE ONE TABLET BY MOUTH TWICE DAILY   [DISCONTINUED] rosuvastatin (CRESTOR) 10 MG tablet TAKE ONE TABLET BY MOUTH EVERY DAY   [DISCONTINUED] spironolactone (ALDACTONE) 25 MG tablet TAKE 1/2 TABLET BY MOUTH EVERY DAY   [DISCONTINUED] TRESIBA FLEXTOUCH 200 UNIT/ML FlexTouch Pen INJECT 100 units into THE SKIN EVERY DAY   [DISCONTINUED] venlafaxine XR (EFFEXOR XR) 75 MG 24 hr  capsule Take 1 capsule (75 mg total) by mouth daily with breakfast.   lidocaine (HM LIDOCAINE PATCH) 4 % Place 1 patch onto the skin every 12 (twelve) hours as needed. (Patient not taking: Reported on 08/25/2022)   mupirocin ointment (BACTROBAN) 2 % Apply 1 application topically 2 (two) times daily. (Patient not taking: Reported on 08/25/2022)   nitroGLYCERIN (NITROSTAT) 0.4 MG SL tablet Place 1 tablet (0.4 mg total) under the tongue every 5 (five) minutes as needed for chest pain. (Patient not taking: Reported on 08/25/2022)   NOVOLIN R 100 UNIT/ML injection INJECT 0.25ML (25 UNITS INTO THE SKIN THREE TIMES DAILY BEFORE MEALS PLUS SLIDING SCALE FOR EVERY 50 OVER 200   promethazine (PHENERGAN) 25 MG tablet Take 0.5-1 tablets (12.5-25 mg total) by mouth 2 (two) times daily as needed for nausea. (Patient not taking: Reported on 08/25/2022)   No facility-administered encounter medications on file as of 08/25/2022.    Allergies (verified) Clindamycin/lincomycin, Erythromycin, Lipitor [atorvastatin], and Penicillins   History: Past Medical History:  Diagnosis Date   Body aches 03/24/2016   Resolved off simvastatin.    CAD (coronary artery disease)    STEMI 07/2013 - treated with PTCA of the RCA followed by DES to the intermediate ramus  //  Myoview 5/17 - Normal perfusion. LVEF 71% with normal wall motion. This is a low risk study.   Chronic bronchitis    Cirrhosis    COVID-19 virus infection 11/20/2020   Depression    Diabetes    GERD (gastroesophageal reflux disease)    High blood pressure    History of chicken pox    History of ovarian cyst    HLD (hyperlipidemia)    Seasonal allergies    Seizure 1977   off AEDs   SVT (supraventricular tachycardia) 10/2015   s/p catheter ablation   Syncopal episodes    Tachyarrhythmia    ER visits with adenosine push to control   Tubal pregnancy    Type 2 diabetes, uncontrolled, with neuropathy    dx 1993   Past Surgical History:  Procedure  Laterality Date   ABLATION OF DYSRHYTHMIC FOCUS  11/06/2015   SVT   CARDIAC CATHETERIZATION  07/2013   angioplasty to RCA and DES to ramus intermedius   COLONOSCOPY WITH ESOPHAGOGASTRODUODENOSCOPY (EGD)  04/2019   esophageal varices II, portal hypertensive gastritis, gastric polyp, H pylori neg, 12 TAs, rpt colonoscopy in 1 yr ([facility])   DILATION AND CURETTAGE OF UTERUS  1984   ECTOPIC PREGNANCY SURGERY Right 1984   tube removal 2/2 hemorrhage   ELECTROPHYSIOLOGIC STUDY N/A 11/06/2015   SVT Ablation;  Marinus Maw, MD;  Location: [facility] INVASIVE CV LAB   LEFT HEART CATHETERIZATION WITH CORONARY ANGIOGRAM N/A 07/23/2013   Procedure: LEFT HEART CATHETERIZATION WITH CORONARY ANGIOGRAM;  Surgeon: Lesleigh Noe, MD;  Location: [facility] CATH LAB;  Service: Cardiovascular;  Laterality: N/A;  OVARIAN CYST SURGERY  1998   PERCUTANEOUS CORONARY STENT INTERVENTION (PCI-S) N/A 07/24/2013   Procedure: PERCUTANEOUS CORONARY STENT INTERVENTION (PCI-S);  Surgeon: Lesleigh Noe, MD;  Location: [facility] CATH LAB;  Service: Cardiovascular;  Laterality: N/A;   ROTATOR CUFF REPAIR  2006   Right   SHOULDER ARTHROSCOPY Left 02/2017   LEFT shoulder arthroscopy with limited debridement, decompression/acromioplasty, open rotator cuff repair and biceps tenodesis ([facility])   SUBMANDIBULAR DUCT LIGATION Right 10/2014   R submandibular gland stone removed [facility])   TONSILLECTOMY AND ADENOIDECTOMY  1965   Family History  Problem Relation Age of Onset   Diabetes Mother    Hyperlipidemia Mother    Hypertension Mother    Stroke Mother    Coronary artery disease Mother        CABG   Heart disease Mother    Diabetes Father    Hypertension Father    Hyperlipidemia Father    Coronary artery disease Father        CABG   Congestive Heart Failure Father    Pancreatic cancer Father    Arrhythmia Brother    Diabetes Brother    Diabetes Maternal Grandmother    Cirrhosis Maternal Grandmother    Diabetes Maternal  Grandfather    Diabetes Paternal Grandmother    Diabetes Paternal Grandfather    Heart disease Brother    Diabetes Brother    Diabetes Sister    Breast cancer Paternal Aunt    Kidney disease Neg Hx    Social History   Socioeconomic History   Marital status: Widowed    Spouse name: Not on file   Number of children: 0   Years of education: 12th grade   Highest education level: Not on file  Occupational History   Occupation: ALF, [employer], med tech/PCA    Employer: [employer]  Tobacco Use   Smoking status: Never   Smokeless tobacco: Never  Vaping Use   Vaping Use: Never used  Substance and Sexual Activity   Alcohol use: No   Drug use: No   Sexual activity: Not Currently  Other Topics Concern   Not on file  Social History Narrative   Caffeine: 6 diet sodas/day   Lives with 2 dogs.   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   Activity: no regular exercise   Diet: some water, fruits/vegetables daily    Social Determinants of Health   Financial Resource Strain: Low Risk  (08/25/2022)   Overall Financial Resource Strain (CARDIA)    Difficulty of Paying Living Expenses: Not hard at all  Food Insecurity: No Food Insecurity (08/25/2022)   Hunger Vital Sign    Worried About Running Out of Food in the Last Year: Never true    Ran Out of Food in the Last Year: Never true  Transportation Needs: No Transportation Needs (08/25/2022)   PRAPARE - Administrator, Civil Service (Medical): No    Lack of Transportation (Non-Medical): No  Physical Activity: Inactive (08/25/2022)   Exercise Vital Sign    Days of Exercise per Week: 0 days    Minutes of Exercise per Session: 0 min  Stress: No Stress Concern Present (08/25/2022)   [employer] of Occupational Health - Occupational Stress Questionnaire    Feeling of Stress : Not at all  Social Connections: Moderately Isolated (08/25/2022)   Social Connection and Isolation Panel [NHANES]    Frequency  of Communication with Friends and Family: More than  three times a week    Frequency of Social Gatherings with Friends and Family: More than three times a week    Attends Religious Services: More than 4 times per year    Active Member of Clubs or Organizations: No    Attends Banker Meetings: Never    Marital Status: Widowed    Tobacco Counseling Counseling given: Not Answered   Clinical Intake:  Pre-visit preparation completed: Yes  Pain : No/denies pain     Nutritional Risks: None Diabetes: Yes CBG done?: Yes CBG resulted in Enter/ Edit results?: No Did pt. bring in CBG monitor from home?: No  How often do you need to have someone help you when you read instructions, pamphlets, or other written materials from your doctor or pharmacy?: 1 - Never  Diabetic?Nutrition Risk Assessment:  Has the patient had any N/V/D within the last 2 months?  No  Does the patient have any non-healing wounds?  No  Has the patient had any unintentional weight loss or weight gain?  No   Diabetes:  Is the patient diabetic?  Yes  If diabetic, was a CBG obtained today?  Yes , 96 per pt. Did the patient bring in their glucometer from home?  No  How often do you monitor your CBG's? Continuous monitor.   Financial Strains and Diabetes Management:  Are you having any financial strains with the device, your supplies or your medication? No .  Does the patient want to be seen by Chronic Care Management for management of their diabetes?  No  Would the patient like to be referred to a Nutritionist or for Diabetic Management?  No   Diabetic Exams:  Diabetic Eye Exam: Completed [facility]  03/10/21 pt will call for appointment. Diabetic Foot Exam: Completed 11/30/21  Podiatrist  Interpreter Needed?: No  Information entered by :: C.Yaeko Fazekas LPN   Activities of Daily Living    08/25/2022    1:32 PM  In your present state of health, do you have any difficulty performing the  following activities:  Hearing? 0  Vision? 1  Comment Left eye  Difficulty concentrating or making decisions? 0  Walking or climbing stairs? 1  Comment Hard to climb but difficult with knee pain  Dressing or bathing? 0  Doing errands, shopping? 0  Preparing Food and eating ? N  Using the Toilet? N  In the past six months, have you accidently leaked urine? N  Do you have problems with loss of bowel control? N  Managing your Medications? N  Managing your Finances? N  Housekeeping or managing your Housekeeping? N    Patient Care Team: Eustaquio Boyden, MD as PCP - General (Family Medicine) Lyn Records, MD (Inactive) as PCP - Cardiology (Cardiology) Kathyrn Sheriff, [facility] as Pharmacist (Pharmacist)  Indicate any recent Medical Services you may have received from other than [facility] providers in the past year (date may be approximate).     Assessment:   This is a routine wellness examination for Karen [name].  Hearing/Vision screen Hearing Screening - Comments:: No aids Vision Screening - Comments:: Glasses - [facility]  Dietary issues and exercise activities discussed: Current Exercise Habits: The patient does not participate in regular exercise at present, Exercise limited by: None identified   Goals Addressed             This Visit's Progress    Patient Stated       Maintain health.      Depression Screen  08/25/2022    1:31 PM 08/16/2021   11:47 AM 07/10/2020    3:19 PM  PHQ 2/9 Scores  PHQ - 2 Score 0 1 0  PHQ- 9 Score  4 2    Fall Risk    08/25/2022    1:32 PM 08/16/2021   10:38 AM 07/10/2020    3:00 PM 10/28/2019   12:20 PM  Fall Risk   Falls in the past year? 0 1 0 0  Comment  Tripped    Number falls in past yr: 0 0  0  Injury with Fall? 0 0  0  Risk for fall due to : No Fall Risks     Follow up Falls prevention discussed;Falls evaluation completed   Falls evaluation completed    FALL RISK PREVENTION PERTAINING TO THE HOME:  Any stairs in or  around the home? No  If so, are there any without handrails? No  Home free of loose throw rugs in walkways, pet beds, electrical cords, etc? Yes  Adequate lighting in your home to reduce risk of falls? Yes   ASSISTIVE DEVICES UTILIZED TO PREVENT FALLS:  Life alert? No  Use of a cane, walker or w/c? No  Grab bars in the bathroom? Yes  Shower chair or bench in shower? No  Elevated toilet seat or a handicapped toilet? No    Cognitive Function:        08/25/2022    1:34 PM  6CIT Screen  What Year? 0 points  What month? 0 points  What time? 0 points  Count back from 20 0 points  Months in reverse 0 points  Repeat phrase 0 points  Total Score 0 points    Immunizations Immunization History  Administered Date(s) Administered   Hep A / Hep B 12/30/2020   Hepatitis A, Adult 07/05/2021   Hepatitis B, PED/ADOLESCENT 07/05/2021, 07/05/2021   Influenza,inj,Quad PF,6+ Mos 06/10/2013, 02/06/2014, 07/02/2015, 03/24/2016, 05/17/2017, 05/31/2018, 04/10/2019, 03/11/2020, 02/23/2021, 04/13/2022   PFIZER(Purple Top)SARS-COV-2 Vaccination 08/13/2019, 09/04/2019, 04/15/2020   PNEUMOCOCCAL CONJUGATE-20 10/26/2020   PPD Test 08/30/2010, 10/05/2010, 11/18/2011   Pneumococcal Polysaccharide-23 06/10/2013   Rabies, IM 07/28/2015, 07/31/2015, 08/04/2015, 08/11/2015   Tdap 07/28/2015    TDAP status: Up to date  Flu Vaccine status: Up to date  Pneumococcal vaccine status: Up to date  Covid-19 vaccine status: Information provided on how to obtain vaccines.   Qualifies for Shingles Vaccine? Yes   Zostavax completed No   Shingrix Completed?: No.    Education has been provided regarding the importance of this vaccine. Patient has been advised to call insurance company to determine out of pocket expense if they have not yet received this vaccine. Advised may also receive vaccine at local pharmacy or Health Dept. Verbalized acceptance and understanding.  Screening Tests Health Maintenance  Topic  Date Due   Zoster Vaccines- Shingrix (1 of 2) Never done   COLONOSCOPY (Pts 45-53yrs Insurance coverage will need to be confirmed)  04/24/2020   MAMMOGRAM  02/06/2021   COVID-19 Vaccine (4 - 2023-24 season) 01/14/2022   PAP SMEAR-Modifier  02/06/2022   OPHTHALMOLOGY EXAM  03/10/2022   Diabetic kidney evaluation - Urine ACR  08/13/2022   DEXA SCAN  Never done   HEMOGLOBIN A1C  10/12/2022   Diabetic kidney evaluation - eGFR measurement  12/01/2022   FOOT EXAM  12/01/2022   INFLUENZA VACCINE  12/15/2022   Medicare Annual Wellness (AWV)  08/25/2023   DTaP/Tdap/Td (2 - Td or Tdap) 07/27/2025   Pneumonia Vaccine 65+  Years old  Completed   Hepatitis C Screening  Completed   HIV Screening  Completed   HPV VACCINES  Aged Out    Health Maintenance  Health Maintenance Due  Topic Date Due   Zoster Vaccines- Shingrix (1 of 2) Never done   COLONOSCOPY (Pts 45-81yrs Insurance coverage will need to be confirmed)  04/24/2020   MAMMOGRAM  02/06/2021   COVID-19 Vaccine (4 - 2023-24 season) 01/14/2022   PAP SMEAR-Modifier  02/06/2022   OPHTHALMOLOGY EXAM  03/10/2022   Diabetic kidney evaluation - Urine ACR  08/13/2022   DEXA SCAN  Never done    Colorectal cancer screening: Type of screening: Colonoscopy. Completed 04/25/19. Repeat every 1 years Has appointment 09/12/22  Mammogram status: Completed 02/07/19. Repeat every year Pt will schedule.    Lung Cancer Screening: (Low Dose CT Chest recommended if Age 64-80 years, 30 pack-year currently smoking OR have quit w/in 15years.) does not qualify.   Lung Cancer Screening Referral: no  Additional Screening:  Hepatitis C Screening: does qualify; Completed 12/08/20  Vision Screening: Recommended annual ophthalmology exams for early detection of glaucoma and other disorders of the eye. Is the patient up to date with their annual eye exam?  No  Who is the provider or what is the name of the office in which the patient attends annual eye exams?  [facility] If pt is not established with a provider, would they like to be referred to a provider to establish care? No .   Dental Screening: Recommended annual dental exams for proper oral hygiene  Community Resource Referral / Chronic Care Management: CRR required this visit?  No   CCM required this visit?  No      Plan:     I have personally reviewed and noted the following in the patient's chart:   Medical and social history Use of alcohol, tobacco or illicit drugs  Current medications and supplements including opioid prescriptions. Patient is not currently taking opioid prescriptions. Functional ability and status Nutritional status Physical activity Advanced directives List of other physicians Hospitalizations, surgeries, and ER visits in previous 12 months Vitals Screenings to include cognitive, depression, and falls Referrals and appointments  In addition, I have reviewed and discussed with patient certain preventive protocols, quality metrics, and best practice recommendations. A written personalized care plan for preventive services as well as general preventive health recommendations were provided to patient.     Maryan Puls, LPN   5/57/3220   Nurse Notes: none

## 2022-09-02 ENCOUNTER — Other Ambulatory Visit: Payer: Self-pay

## 2022-09-09 ENCOUNTER — Other Ambulatory Visit (HOSPITAL_COMMUNITY): Payer: Self-pay

## 2022-09-12 ENCOUNTER — Ambulatory Visit: Payer: HMO | Admitting: Physician Assistant

## 2022-09-12 ENCOUNTER — Other Ambulatory Visit (HOSPITAL_COMMUNITY): Payer: Self-pay

## 2022-09-19 ENCOUNTER — Other Ambulatory Visit: Payer: Self-pay

## 2022-09-23 ENCOUNTER — Telehealth: Payer: Self-pay | Admitting: Family Medicine

## 2022-09-23 ENCOUNTER — Other Ambulatory Visit: Payer: Self-pay

## 2022-09-23 ENCOUNTER — Other Ambulatory Visit: Payer: Self-pay | Admitting: Family Medicine

## 2022-09-23 ENCOUNTER — Other Ambulatory Visit (HOSPITAL_COMMUNITY): Payer: Self-pay

## 2022-09-23 MED ORDER — PROMETHAZINE HCL 25 MG PO TABS
12.5000 mg | ORAL_TABLET | Freq: Two times a day (BID) | ORAL | 0 refills | Status: AC | PRN
  Filled 2022-09-23: qty 20, 10d supply, fill #0

## 2022-09-23 MED ORDER — POTASSIUM CHLORIDE ER 10 MEQ PO TBCR
10.0000 meq | EXTENDED_RELEASE_TABLET | Freq: Every day | ORAL | 3 refills | Status: DC | PRN
  Filled 2022-09-23: qty 30, 30d supply, fill #0

## 2022-09-23 NOTE — Telephone Encounter (Signed)
Promethazine Last rx:  05/03/21, #20 Last OV:  04/13/22, DM f/u Next OV:  10/10/22, CPE

## 2022-09-23 NOTE — Telephone Encounter (Signed)
K+ tablets sent to pharmacy.

## 2022-09-23 NOTE — Telephone Encounter (Signed)
[facility] called in to make PCP aware the they can't get RX KLOR-CON SPRINKLE 10 MEQ CR capsule  # [phone]

## 2022-09-24 ENCOUNTER — Other Ambulatory Visit (HOSPITAL_COMMUNITY): Payer: Self-pay

## 2022-09-26 ENCOUNTER — Other Ambulatory Visit: Payer: Self-pay

## 2022-09-30 ENCOUNTER — Telehealth: Payer: Self-pay

## 2022-09-30 NOTE — Progress Notes (Signed)
Care Management & Coordination Services Pharmacy Team  Reason for Encounter: DM Eye Exam Care Gap  Per insurance records, patient is at risk for failing diabetes standards of care recommendations for annual diabetic eye exam. Reviewed chart for diabetes diagnosis and last documented eye exam.  Patient has a diabetes diagnosis.  The last documented eye exam was 2022  Action taken: Unsuccessful outreach. Left voicemail for patient to return call. Offered appointment at [facility] in July 2024 for the upcoming  DM EYE clinic.    Al Corpus, PharmD notified  Burt Knack, [facility] Clinical Pharmacy Assistant [phone]

## 2022-10-03 ENCOUNTER — Other Ambulatory Visit: Payer: PPO

## 2022-10-03 ENCOUNTER — Other Ambulatory Visit (HOSPITAL_COMMUNITY): Payer: Self-pay

## 2022-10-04 ENCOUNTER — Other Ambulatory Visit: Payer: Self-pay

## 2022-10-05 ENCOUNTER — Other Ambulatory Visit: Payer: Self-pay | Admitting: Family Medicine

## 2022-10-05 ENCOUNTER — Other Ambulatory Visit (HOSPITAL_COMMUNITY): Payer: Self-pay

## 2022-10-05 ENCOUNTER — Other Ambulatory Visit: Payer: Self-pay

## 2022-10-05 DIAGNOSIS — E113513 Type 2 diabetes mellitus with proliferative diabetic retinopathy with macular edema, bilateral: Secondary | ICD-10-CM

## 2022-10-07 ENCOUNTER — Other Ambulatory Visit: Payer: PPO

## 2022-10-10 ENCOUNTER — Encounter: Payer: PPO | Admitting: Family Medicine

## 2022-10-11 ENCOUNTER — Encounter: Payer: PPO | Admitting: Family Medicine

## 2022-10-17 ENCOUNTER — Other Ambulatory Visit: Payer: Self-pay | Admitting: Family Medicine

## 2022-10-17 ENCOUNTER — Other Ambulatory Visit: Payer: Self-pay

## 2022-10-17 ENCOUNTER — Other Ambulatory Visit (HOSPITAL_COMMUNITY): Payer: Self-pay

## 2022-10-17 DIAGNOSIS — F331 Major depressive disorder, recurrent, moderate: Secondary | ICD-10-CM

## 2022-10-17 MED ORDER — VENLAFAXINE HCL ER 75 MG PO CP24
75.0000 mg | ORAL_CAPSULE | Freq: Every day | ORAL | 3 refills | Status: DC
Start: 2022-10-17 — End: 2022-12-16
  Filled 2022-10-17: qty 90, 90d supply, fill #0

## 2022-11-02 ENCOUNTER — Other Ambulatory Visit: Payer: Self-pay | Admitting: Family Medicine

## 2022-11-02 ENCOUNTER — Other Ambulatory Visit (HOSPITAL_COMMUNITY): Payer: Self-pay

## 2022-11-02 ENCOUNTER — Other Ambulatory Visit: Payer: Self-pay

## 2022-11-02 DIAGNOSIS — Z794 Long term (current) use of insulin: Secondary | ICD-10-CM

## 2022-11-02 NOTE — Telephone Encounter (Signed)
Karen [name] Last filled:  08/29/22, 18 mL Last OV:  04/13/22, DM f/u Next OV:  12/16/22, CPE

## 2022-11-03 ENCOUNTER — Other Ambulatory Visit (HOSPITAL_COMMUNITY): Payer: Self-pay

## 2022-11-03 MED ORDER — TRESIBA FLEXTOUCH 200 UNIT/ML ~~LOC~~ SOPN
100.0000 [IU] | PEN_INJECTOR | Freq: Every day | SUBCUTANEOUS | 1 refills | Status: DC
Start: 2022-11-03 — End: 2022-12-16
  Filled 2022-11-03: qty 45, 90d supply, fill #0

## 2022-11-03 NOTE — Telephone Encounter (Signed)
ERx 

## 2022-11-04 ENCOUNTER — Other Ambulatory Visit: Payer: Self-pay

## 2022-11-05 ENCOUNTER — Other Ambulatory Visit (HOSPITAL_COMMUNITY): Payer: Self-pay

## 2022-11-08 ENCOUNTER — Other Ambulatory Visit (HOSPITAL_COMMUNITY): Payer: Self-pay

## 2022-11-15 ENCOUNTER — Ambulatory Visit: Payer: PPO | Admitting: Physician Assistant

## 2022-11-15 ENCOUNTER — Ambulatory Visit (INDEPENDENT_AMBULATORY_CARE_PROVIDER_SITE_OTHER): Payer: PPO | Admitting: Podiatry

## 2022-11-15 ENCOUNTER — Encounter: Payer: Self-pay | Admitting: Podiatry

## 2022-11-15 DIAGNOSIS — D2372 Other benign neoplasm of skin of left lower limb, including hip: Secondary | ICD-10-CM

## 2022-11-15 DIAGNOSIS — E1142 Type 2 diabetes mellitus with diabetic polyneuropathy: Secondary | ICD-10-CM | POA: Diagnosis not present

## 2022-11-15 DIAGNOSIS — D2371 Other benign neoplasm of skin of right lower limb, including hip: Secondary | ICD-10-CM | POA: Diagnosis not present

## 2022-11-15 DIAGNOSIS — M79676 Pain in unspecified toe(s): Secondary | ICD-10-CM | POA: Diagnosis not present

## 2022-11-15 DIAGNOSIS — B351 Tinea unguium: Secondary | ICD-10-CM | POA: Diagnosis not present

## 2022-11-15 NOTE — Progress Notes (Signed)
He presents today chief complaint of painful elongated toenails and calluses bilateral.  Objective: Vital signs stable oriented x 3 pulses are palpable.  Benign skin lesions forefoot bilateral.  Toenails are long thick yellow dystrophic onychomycotic.  Assessment: Pain in limb secondary to onychomycosis and benign skin lesions.  Plan: Debridement of benign skin lesion debridement of onychomycotic nails 1 through 5 bilateral covered service pain.

## 2022-11-19 ENCOUNTER — Other Ambulatory Visit: Payer: Self-pay | Admitting: Family Medicine

## 2022-11-19 DIAGNOSIS — Z794 Long term (current) use of insulin: Secondary | ICD-10-CM

## 2022-11-19 DIAGNOSIS — I10 Essential (primary) hypertension: Secondary | ICD-10-CM

## 2022-11-21 ENCOUNTER — Other Ambulatory Visit (HOSPITAL_COMMUNITY): Payer: Self-pay

## 2022-11-21 ENCOUNTER — Other Ambulatory Visit: Payer: Self-pay

## 2022-11-21 MED ORDER — PROPRANOLOL HCL 20 MG PO TABS
20.0000 mg | ORAL_TABLET | Freq: Two times a day (BID) | ORAL | 0 refills | Status: DC
Start: 2022-11-21 — End: 2022-12-16
  Filled 2022-11-21: qty 180, 90d supply, fill #0

## 2022-11-21 MED ORDER — FREESTYLE LIBRE 2 SENSOR MISC
0 refills | Status: DC
Start: 2022-11-21 — End: 2023-01-18
  Filled 2022-11-21: qty 6, 84d supply, fill #0

## 2022-12-02 ENCOUNTER — Telehealth: Payer: Self-pay | Admitting: Physician Assistant

## 2022-12-02 NOTE — Telephone Encounter (Signed)
  Patient c/o Palpitations:  STAT if patient reporting lightheadedness, shortness of breath, or chest pain  How long have you had palpitations/irregular HR/ Afib? Are you having the symptoms now? Yes   Are you currently experiencing lightheadedness, SOB or CP? During episode and feeling tired   Do you have a history of afib (atrial fibrillation) or irregular heart rhythm? Yes   Have you checked your BP or HR? (document readings if available): no   Are you experiencing any other symptoms?    The patient reported experiencing an episode of arrhythmia last week and another one this morning. She described this morning's episode as similar to the sensation she had prior to her ablation in 2017. She experiences shortness of breath and fatigue during these episodes. She would like to know if she should schedule an office visit with her gen cards or need to refer back to EP.

## 2022-12-02 NOTE — Telephone Encounter (Signed)
Lm to call back ./cy 

## 2022-12-08 NOTE — Telephone Encounter (Signed)
Spoke with the patient who states that she is feeling good today. She has not had anymore episodes. She does not monitor her heart rate or blood pressure. Advised her to monitor these. Also advised to make sure she is staying hydrated. She reports taking her medications has prescribed. Her last appointment with [place] was a virtual visit in 2022. I have scheduled her an office visit. Advised her to call back in the meantime if she has any further symptoms.

## 2022-12-09 ENCOUNTER — Other Ambulatory Visit (INDEPENDENT_AMBULATORY_CARE_PROVIDER_SITE_OTHER): Payer: PPO

## 2022-12-09 DIAGNOSIS — E1169 Type 2 diabetes mellitus with other specified complication: Secondary | ICD-10-CM | POA: Diagnosis not present

## 2022-12-09 DIAGNOSIS — E785 Hyperlipidemia, unspecified: Secondary | ICD-10-CM | POA: Diagnosis not present

## 2022-12-09 DIAGNOSIS — E118 Type 2 diabetes mellitus with unspecified complications: Secondary | ICD-10-CM

## 2022-12-09 DIAGNOSIS — K7469 Other cirrhosis of liver: Secondary | ICD-10-CM | POA: Diagnosis not present

## 2022-12-09 LAB — CBC WITH DIFFERENTIAL/PLATELET
Basophils Absolute: 0 10*3/uL (ref 0.0–0.1)
Basophils Relative: 0.5 % (ref 0.0–3.0)
Eosinophils Absolute: 0.1 10*3/uL (ref 0.0–0.7)
Eosinophils Relative: 3.3 % (ref 0.0–5.0)
HCT: 38.7 % (ref 36.0–46.0)
Hemoglobin: 12.4 g/dL (ref 12.0–15.0)
Lymphocytes Relative: 38.3 % (ref 12.0–46.0)
Lymphs Abs: 1.3 10*3/uL (ref 0.7–4.0)
MCHC: 32.1 g/dL (ref 30.0–36.0)
MCV: 88.1 fl (ref 78.0–100.0)
Monocytes Absolute: 0.3 10*3/uL (ref 0.1–1.0)
Monocytes Relative: 8 % (ref 3.0–12.0)
Neutro Abs: 1.7 10*3/uL (ref 1.4–7.7)
Neutrophils Relative %: 49.9 % (ref 43.0–77.0)
Platelets: 99 10*3/uL — ABNORMAL LOW (ref 150.0–400.0)
RBC: 4.39 Mil/uL (ref 3.87–5.11)
RDW: 15.5 % (ref 11.5–15.5)
WBC: 3.4 10*3/uL — ABNORMAL LOW (ref 4.0–10.5)

## 2022-12-09 LAB — MICROALBUMIN / CREATININE URINE RATIO
Creatinine,U: 97.6 mg/dL
Microalb Creat Ratio: 2.6 mg/g (ref 0.0–30.0)
Microalb, Ur: 2.6 mg/dL — ABNORMAL HIGH (ref 0.0–1.9)

## 2022-12-09 LAB — COMPREHENSIVE METABOLIC PANEL
ALT: 18 U/L (ref 0–35)
AST: 27 U/L (ref 0–37)
Albumin: 3.9 g/dL (ref 3.5–5.2)
Alkaline Phosphatase: 71 U/L (ref 39–117)
BUN: 12 mg/dL (ref 6–23)
CO2: 25 mEq/L (ref 19–32)
Calcium: 9.6 mg/dL (ref 8.4–10.5)
Chloride: 104 mEq/L (ref 96–112)
Creatinine, Ser: 0.84 mg/dL (ref 0.40–1.20)
GFR: 72.99 mL/min (ref >60.00)
Glucose, Bld: 86 mg/dL (ref 70–99)
Potassium: 4.5 mEq/L (ref 3.5–5.1)
Sodium: 139 mEq/L (ref 135–145)
Total Bilirubin: 0.7 mg/dL (ref 0.2–1.2)
Total Protein: 7 g/dL (ref 6.0–8.3)

## 2022-12-09 LAB — HEMOGLOBIN A1C: Hgb A1c MFr Bld: 6.9 % — ABNORMAL HIGH (ref 4.6–6.5)

## 2022-12-09 LAB — LIPID PANEL
Cholesterol: 134 mg/dL (ref 0–200)
HDL: 37.5 mg/dL — ABNORMAL LOW (ref >39.00)
NonHDL: 96.85
Total CHOL/HDL Ratio: 4
Triglycerides: 271 mg/dL — ABNORMAL HIGH (ref 0.0–149.0)
VLDL: 54.2 mg/dL — ABNORMAL HIGH (ref 0.0–40.0)

## 2022-12-09 LAB — PROTIME-INR
INR: 1.1 ratio — ABNORMAL HIGH (ref 0.8–1.0)
Prothrombin Time: 11.5 s (ref 9.6–13.1)

## 2022-12-09 LAB — LDL CHOLESTEROL, DIRECT: Direct LDL: 61 mg/dL

## 2022-12-11 ENCOUNTER — Other Ambulatory Visit: Payer: Self-pay | Admitting: Family Medicine

## 2022-12-11 DIAGNOSIS — K219 Gastro-esophageal reflux disease without esophagitis: Secondary | ICD-10-CM

## 2022-12-11 DIAGNOSIS — I10 Essential (primary) hypertension: Secondary | ICD-10-CM

## 2022-12-12 ENCOUNTER — Other Ambulatory Visit (HOSPITAL_COMMUNITY): Payer: Self-pay

## 2022-12-12 MED ORDER — LOSARTAN POTASSIUM 25 MG PO TABS
25.0000 mg | ORAL_TABLET | Freq: Every day | ORAL | 0 refills | Status: DC
Start: 2022-12-12 — End: 2022-12-16
  Filled 2022-12-12: qty 90, 90d supply, fill #0

## 2022-12-12 MED ORDER — OMEPRAZOLE 40 MG PO CPDR
40.0000 mg | DELAYED_RELEASE_CAPSULE | Freq: Two times a day (BID) | ORAL | 0 refills | Status: DC
Start: 2022-12-12 — End: 2022-12-16
  Filled 2022-12-12: qty 180, 90d supply, fill #0

## 2022-12-13 ENCOUNTER — Other Ambulatory Visit (HOSPITAL_COMMUNITY): Payer: Self-pay

## 2022-12-13 ENCOUNTER — Other Ambulatory Visit: Payer: Self-pay

## 2022-12-16 ENCOUNTER — Encounter: Payer: Self-pay | Admitting: Family Medicine

## 2022-12-16 ENCOUNTER — Other Ambulatory Visit (HOSPITAL_COMMUNITY): Payer: Self-pay

## 2022-12-16 ENCOUNTER — Ambulatory Visit (INDEPENDENT_AMBULATORY_CARE_PROVIDER_SITE_OTHER): Payer: PPO | Admitting: Family Medicine

## 2022-12-16 VITALS — BP 132/68 | HR 73 | Temp 97.5°F | Ht 60.5 in | Wt 173.1 lb

## 2022-12-16 DIAGNOSIS — Z794 Long term (current) use of insulin: Secondary | ICD-10-CM

## 2022-12-16 DIAGNOSIS — F331 Major depressive disorder, recurrent, moderate: Secondary | ICD-10-CM

## 2022-12-16 DIAGNOSIS — K219 Gastro-esophageal reflux disease without esophagitis: Secondary | ICD-10-CM

## 2022-12-16 DIAGNOSIS — K7469 Other cirrhosis of liver: Secondary | ICD-10-CM

## 2022-12-16 DIAGNOSIS — E1169 Type 2 diabetes mellitus with other specified complication: Secondary | ICD-10-CM | POA: Diagnosis not present

## 2022-12-16 DIAGNOSIS — I2510 Atherosclerotic heart disease of native coronary artery without angina pectoris: Secondary | ICD-10-CM

## 2022-12-16 DIAGNOSIS — Z7189 Other specified counseling: Secondary | ICD-10-CM

## 2022-12-16 DIAGNOSIS — I10 Essential (primary) hypertension: Secondary | ICD-10-CM | POA: Diagnosis not present

## 2022-12-16 DIAGNOSIS — Z0000 Encounter for general adult medical examination without abnormal findings: Secondary | ICD-10-CM | POA: Diagnosis not present

## 2022-12-16 DIAGNOSIS — D696 Thrombocytopenia, unspecified: Secondary | ICD-10-CM

## 2022-12-16 DIAGNOSIS — K089 Disorder of teeth and supporting structures, unspecified: Secondary | ICD-10-CM

## 2022-12-16 DIAGNOSIS — E113513 Type 2 diabetes mellitus with proliferative diabetic retinopathy with macular edema, bilateral: Secondary | ICD-10-CM | POA: Diagnosis not present

## 2022-12-16 MED ORDER — ROSUVASTATIN CALCIUM 10 MG PO TABS
10.0000 mg | ORAL_TABLET | Freq: Every day | ORAL | 4 refills | Status: DC
Start: 2022-12-16 — End: 2024-01-27
  Filled 2022-12-16 – 2023-01-26 (×2): qty 90, 90d supply, fill #0
  Filled 2023-03-23 – 2023-05-06 (×2): qty 90, 90d supply, fill #1
  Filled 2023-07-31: qty 90, 90d supply, fill #2
  Filled 2023-10-30: qty 90, 90d supply, fill #3

## 2022-12-16 MED ORDER — SPIRONOLACTONE 25 MG PO TABS
12.5000 mg | ORAL_TABLET | Freq: Every day | ORAL | 4 refills | Status: DC
Start: 2022-12-16 — End: 2024-03-03
  Filled 2022-12-16 – 2023-03-23 (×2): qty 45, 90d supply, fill #0
  Filled 2023-06-14: qty 45, 90d supply, fill #1
  Filled 2023-09-12: qty 45, 90d supply, fill #2
  Filled 2023-12-11: qty 45, 90d supply, fill #3

## 2022-12-16 MED ORDER — LOSARTAN POTASSIUM 25 MG PO TABS
25.0000 mg | ORAL_TABLET | Freq: Every day | ORAL | 4 refills | Status: DC
Start: 2022-12-16 — End: 2024-03-03
  Filled 2022-12-16 – 2023-03-23 (×2): qty 90, 90d supply, fill #0
  Filled 2023-06-14: qty 90, 90d supply, fill #1
  Filled 2023-09-12: qty 90, 90d supply, fill #2
  Filled 2023-12-11: qty 90, 90d supply, fill #3

## 2022-12-16 MED ORDER — TRESIBA FLEXTOUCH 200 UNIT/ML ~~LOC~~ SOPN
90.0000 [IU] | PEN_INJECTOR | Freq: Every day | SUBCUTANEOUS | 1 refills | Status: DC
  Filled 2022-12-16: qty 45, fill #0
  Filled 2023-01-18: qty 45, 100d supply, fill #0
  Filled 2023-05-06: qty 45, 100d supply, fill #1

## 2022-12-16 MED ORDER — METFORMIN HCL ER 750 MG PO TB24
750.0000 mg | ORAL_TABLET | Freq: Two times a day (BID) | ORAL | 4 refills | Status: DC
Start: 2022-12-16 — End: 2024-03-03
  Filled 2022-12-16: qty 180, 90d supply, fill #0
  Filled 2023-03-23: qty 180, 90d supply, fill #1
  Filled 2023-06-14: qty 180, 90d supply, fill #2
  Filled 2023-09-12: qty 180, 90d supply, fill #3
  Filled 2023-12-11: qty 180, 90d supply, fill #4

## 2022-12-16 MED ORDER — PROPRANOLOL HCL 20 MG PO TABS
20.0000 mg | ORAL_TABLET | Freq: Two times a day (BID) | ORAL | 4 refills | Status: DC
Start: 2022-12-16 — End: 2024-01-06
  Filled 2022-12-16: qty 180, 90d supply, fill #0
  Filled 2023-04-17: qty 180, 90d supply, fill #1
  Filled 2023-07-10: qty 180, 90d supply, fill #2
  Filled 2023-10-09: qty 180, 90d supply, fill #3

## 2022-12-16 MED ORDER — OMEPRAZOLE 40 MG PO CPDR
40.0000 mg | DELAYED_RELEASE_CAPSULE | Freq: Two times a day (BID) | ORAL | 4 refills | Status: DC
Start: 2022-12-16 — End: 2024-03-03
  Filled 2022-12-16 – 2023-03-23 (×2): qty 180, 90d supply, fill #0
  Filled 2023-06-14: qty 180, 90d supply, fill #1
  Filled 2023-09-12: qty 180, 90d supply, fill #2
  Filled 2023-12-11: qty 180, 90d supply, fill #3

## 2022-12-16 MED ORDER — VENLAFAXINE HCL ER 75 MG PO CP24
75.0000 mg | ORAL_CAPSULE | Freq: Every day | ORAL | 4 refills | Status: DC
Start: 2022-12-16 — End: 2024-01-06
  Filled 2022-12-16 – 2023-01-23 (×3): qty 90, 90d supply, fill #0
  Filled 2023-04-17: qty 90, 90d supply, fill #1
  Filled 2023-07-15: qty 90, 90d supply, fill #2
  Filled 2023-10-13: qty 90, 90d supply, fill #3

## 2022-12-16 MED ORDER — PEN NEEDLES 31G X 8 MM MISC
3 refills | Status: DC
Start: 2022-12-16 — End: 2023-07-31
  Filled 2022-12-16: qty 100, 30d supply, fill #0
  Filled 2023-05-06 – 2023-05-08 (×2): qty 100, 30d supply, fill #1
  Filled 2023-05-31 (×2): qty 100, 30d supply, fill #2
  Filled 2023-06-30 (×2): qty 100, 30d supply, fill #3

## 2022-12-16 MED ORDER — GABAPENTIN 300 MG PO CAPS
300.0000 mg | ORAL_CAPSULE | Freq: Every day | ORAL | 4 refills | Status: DC
  Filled 2022-12-16: qty 90, 90d supply, fill #0

## 2022-12-16 NOTE — Patient Instructions (Addendum)
Pass by our mobile mammography unit today for mammogram.  Keep appointment with [facility] to discuss repeat colonoscopy.  If interested, check with pharmacy about new 2 shot shingles series (shingrix).  At your convenience schedule diabetic eye exam.  Drop tresiba to 90 units nightly.  Return in 4-6 months for diabetes follow up visit   The 15-15 rule for low sugars: If sugar reading below 70, have 15 grams of carbohydrate to raise your blood sugar and check it after 15 minutes. If it's still below 70 mg/dL, have another serving. 15 grams of carbs may be: -Glucose tablets (see instructions) -Gel tube (see instructions) -4 ounces (1/2 cup) of juice or regular soda (not diet) -1 tablespoon of sugar, honey, or corn syrup -Hard candies, jellybeans or gumdrops--see food label for how many to consume  Repeat these steps until your blood sugar is at least 70 mg/dL. Once your blood sugar is back to normal, eat a meal or snack to make sure it doesn't lower again.

## 2022-12-16 NOTE — Progress Notes (Unsigned)
Ph: [phone] Fax: [phone]   Patient ID: Karen [name], female    DOB: 1955-12-26, 67 y.o.   MRN: [id number]  This visit was conducted in person.  BP 132/68   Pulse 73   Temp (!) 97.5 F (36.4 C) (Temporal)   Ht 5' 0.5" (1.537 m)   Wt 173 lb 2 oz (78.5 kg)   SpO2 98%   BMI 33.25 kg/m    CC: CPE Subjective:   HPI: Karen [name] is a 67 y.o. female presenting on 12/16/2022 for Annual Exam (MCR prt 2 [AWV- 08/25/22].)   Saw health advisor 08/2022 for medicare wellness visit. Note reviewed.   No results found.  Flowsheet Row Clinical Support from 08/25/2022 in [facility] [place]  PHQ-2 Total Score 0          08/25/2022    1:32 PM 08/16/2021   10:38 AM 07/10/2020    3:00 PM 10/28/2019   12:20 PM  Fall Risk   Falls in the past year? 0 1 0 0  Comment  Tripped    Number falls in past yr: 0 0  0  Injury with Fall? 0 0  0  Risk for fall due to : No Fall Risks     Follow up Falls prevention discussed;Falls evaluation completed   Falls evaluation completed   DM - continues ozempic 1mg  weekly, metformin XR 750mg  bid, tresiba 100u at bedtime, and sliding scale Novolin R 25u TID + sliding scale - she notes she's not regularly using Novolin R. Regularly sees podiatrist for foot care.   CGM data: Freestyle Libre 2 30 day average sugar: 136, time in range 76%, hypoglycemia 5%, stage 1 hyperglycemia 16%, stage 2 hyperglycemia 3%.  21 hypoglycemic events in the past 30 days. Declines Gvoke glucagon pen Rx. 10 hypoglycemia between 12am-6am.  Known cirrhosis by imaging. Last RUQ [place] 11/2021 with gallstones noted.  Denies claudication symptoms.   She is now on effexor in place of cymbalta due to liver disease.   Preventative: COLONOSCOPY WITH ESOPHAGOGASTRODUODENOSCOPY 04/2019 - esophageal varices II, portal hypertensive gastritis, gastric polyp, H pylori neg, 12 TAs, rpt colonoscopy in 1 yr ([facility]) - never rescheduled.  Well woman exam 01/2019 - h/o  abnormal but not recently, neg HPV, told rpt 3 yrs  - DUE for this. Requests we defer to next time.  [facility] - DUE for this.  Lung cancer screening - not eligible  Flu shot - yearly COVID vaccine Pfizer 07/2019, 08/2019, booster 04/2020 Pneumovax-23 - 2015, prevnar-20 10/2020 Tdap - 2017 Shingrix - discussed  Advanced directive discussed: does not have. She's unsure who she would designate as HCPOA - possibly brother Allen Derry. Packet previously provided.  Seat belt use discussed Sunscreen use discussed. No changing moles on skin.  Smoking - never Alcohol - none Dentist - due for dental work - trouble finding dentist  Eye exam - yearly Darel Hong - also sees Community education officer s/p lens to right eye Bowel - no constipation  Bladder - mild stress incontinence   Caffeine: 2-3 diet sodas/day Lives alone  Occupation: was CNA - now on disability Activity: walking some Diet: some water, fruits/vegetables daily      Relevant past medical, surgical, family and social history reviewed and updated as indicated. Interim medical history since our last visit reviewed. Allergies and medications reviewed and updated. Outpatient Medications Prior to Visit  Medication Sig Dispense Refill   acetaminophen (TYLENOL) 500  MG tablet Take 1,000 mg by mouth 3 (three) times daily as needed for mild pain.     aspirin EC 81 MG EC tablet Take 1 tablet (81 mg total) by mouth daily. (Patient taking differently: Take 81 mg by mouth at bedtime.)     Continuous Blood Gluc Receiver (FREESTYLE LIBRE 2 READER) DEVI Use as directed to check sugars 3-4 times daily 1 each 0   Continuous Glucose Sensor (FREESTYLE LIBRE 2 SENSOR) MISC Use as directed to check blood sugars 3-4 times daily 6 each 0   dicyclomine (BENTYL) 20 MG tablet Take 1 tablet (20 mg total) by mouth 2 (two) times daily as needed for spasms. (Patient taking differently: Take 20 mg by mouth as needed for spasms.) 30 tablet 3    furosemide (LASIX) 20 MG tablet Take 1 tablet (20 mg total) by mouth daily as needed for edema. 30 tablet 1   lidocaine (HM LIDOCAINE PATCH) 4 % Place 1 patch onto the skin every 12 (twelve) hours as needed. 30 patch 0   mupirocin ointment (BACTROBAN) 2 % Apply 1 application topically 2 (two) times daily. 22 g 0   nitroGLYCERIN (NITROSTAT) 0.4 MG SL tablet Place 1 tablet (0.4 mg total) under the tongue every 5 (five) minutes as needed for chest pain. 25 tablet 0   NOVOLIN R 100 UNIT/ML injection INJECT 0.25ML (25 UNITS INTO THE SKIN THREE TIMES DAILY BEFORE MEALS PLUS SLIDING SCALE FOR EVERY 50 OVER 200 10 mL 0   polyethylene glycol (MIRALAX / GLYCOLAX) 17 g packet Take 17 g by mouth daily as needed for mild constipation.     potassium chloride (KLOR-CON) 10 MEQ tablet Take 1 tablet (10 mEq total) by mouth daily as needed (with lasix). 30 tablet 3   promethazine (PHENERGAN) 25 MG tablet Take 0.5-1 tablets (12.5-25 mg total) by mouth 2 (two) times daily as needed for nausea. 20 tablet 0   Semaglutide, 1 MG/DOSE, 4 MG/3ML SOPN Inject 1 mg as directed once a week. (Patient taking differently: Inject 1 mg as directed every Sunday.) 3 mL 11   gabapentin (NEURONTIN) 300 MG capsule Take 1 capsule (300 mg total) by mouth at bedtime. 90 capsule 1   insulin degludec (TRESIBA FLEXTOUCH) 200 UNIT/ML FlexTouch Pen Inject 100 Units into the skin daily. 45 mL 1   Insulin Pen Needle (PEN NEEDLES) 31G X 8 MM MISC Use to inject Lantus as directed twice daily. Dx: E11.40 100 each 3   losartan (COZAAR) 25 MG tablet Take 1 tablet (25 mg total) by mouth daily. 90 tablet 0   metFORMIN (GLUCOPHAGE-XR) 750 MG 24 hr tablet Take 1 tablet (750 mg total) by mouth in the morning and at bedtime. 180 tablet 0   omeprazole (PRILOSEC) 40 MG capsule Take 1 capsule (40 mg total) by mouth 2 (two) times daily. 180 capsule 0   propranolol (INDERAL) 20 MG tablet Take 1 tablet (20 mg total) by mouth 2 (two) times daily. 180 tablet 0    rosuvastatin (CRESTOR) 10 MG tablet Take 1 tablet (10 mg total) by mouth daily. 90 tablet 0   spironolactone (ALDACTONE) 25 MG tablet Take 0.5 tablets (12.5 mg total) by mouth daily. 45 tablet 0   venlafaxine XR (EFFEXOR XR) 75 MG 24 hr capsule Take 1 capsule (75 mg total) by mouth daily with breakfast. 90 capsule 3   Insulin Regular Human (NOVOLIN R FLEXPEN RELION) 100 UNIT/ML KwikPen Inject 50 Units as directed 3 (three) times daily before meals. + SSI  5 units for every 50 units over 250 45 mL 1   nystatin cream (MYCOSTATIN) Apply 1 Application topically 2 (two) times daily. 30 g 1   PENTIPS 31G X 6 MM MISC Use to inject Lantus as directed twice daily. Dx E11.40     No facility-administered medications prior to visit.     Per HPI unless specifically indicated in ROS section below Review of Systems  Constitutional:  Negative for activity change, appetite change, chills, fatigue, fever and unexpected weight change.  HENT:  Negative for hearing loss.   Eyes:  Negative for visual disturbance.  Respiratory:  Negative for cough, chest tightness, shortness of breath and wheezing.   Cardiovascular:  Positive for palpitations and leg swelling. Negative for chest pain.  Gastrointestinal:  Negative for abdominal distention, abdominal pain, blood in stool, constipation, diarrhea, nausea and vomiting.  Genitourinary:  Negative for difficulty urinating and hematuria.  Musculoskeletal:  Negative for arthralgias, myalgias and neck pain.  Skin:  Negative for rash.  Neurological:  Positive for dizziness. Negative for seizures, syncope and headaches.  Hematological:  Negative for adenopathy. Does not bruise/bleed easily.  Psychiatric/Behavioral:  Negative for dysphoric mood. The patient is not nervous/anxious.     Objective:  BP 132/68   Pulse 73   Temp (!) 97.5 F (36.4 C) (Temporal)   Ht 5' 0.5" (1.537 m)   Wt 173 lb 2 oz (78.5 kg)   SpO2 98%   BMI 33.25 kg/m   Wt Readings from Last 3  Encounters:  12/16/22 173 lb 2 oz (78.5 kg)  08/25/22 175 lb (79.4 kg)  04/13/22 175 lb 8 oz (79.6 kg)      Physical Exam Vitals and nursing note reviewed.  Constitutional:      Appearance: Normal appearance. She is not ill-appearing.  HENT:     Head: Normocephalic and atraumatic.     Right Ear: Tympanic membrane, ear canal and external ear normal. There is no impacted cerumen.     Left Ear: Tympanic membrane, ear canal and external ear normal. There is no impacted cerumen.     Nose: Nose normal.     Mouth/Throat:     Mouth: Mucous membranes are moist.     Pharynx: Oropharynx is clear. No oropharyngeal exudate or posterior oropharyngeal erythema.  Eyes:     General:        Right eye: No discharge.        Left eye: No discharge.     Extraocular Movements: Extraocular movements intact.     Conjunctiva/sclera: Conjunctivae normal.     Pupils: Pupils are equal, round, and reactive to light.  Neck:     Thyroid: No thyroid mass or thyromegaly.     Vascular: No carotid bruit.  Cardiovascular:     Rate and Rhythm: Normal rate and regular rhythm.     Pulses: Normal pulses.     Heart sounds: Normal heart sounds. No murmur heard. Pulmonary:     Effort: Pulmonary effort is normal. No respiratory distress.     Breath sounds: Normal breath sounds. No wheezing, rhonchi or rales.  Abdominal:     General: Bowel sounds are normal. There is no distension.     Palpations: Abdomen is soft. There is no mass.     Tenderness: There is no abdominal tenderness. There is no guarding or rebound.     Hernia: No hernia is present.  Musculoskeletal:     Cervical back: Normal range of motion and neck supple. No rigidity.  Right lower leg: No edema.     Left lower leg: No edema.  Lymphadenopathy:     Cervical: No cervical adenopathy.  Skin:    General: Skin is warm and dry.     Findings: No rash.  Neurological:     General: No focal deficit present.     Mental Status: She is alert. Mental status  is at baseline.  Psychiatric:        Mood and Affect: Mood normal.        Behavior: Behavior normal.       Results for orders placed or performed in visit on 12/09/22  Hemoglobin A1c  Result Value Ref Range   Hgb A1c MFr Bld 6.9 (H) 4.6 - 6.5 %  Microalbumin / creatinine urine ratio  Result Value Ref Range   Microalb, Ur 2.6 (H) 0.0 - 1.9 mg/dL   Creatinine,U 16.1 mg/dL   Microalb Creat Ratio 2.6 0.0 - 30.0 mg/g  Lipid panel  Result Value Ref Range   Cholesterol 134 0 - 200 mg/dL   Triglycerides 096.0 (H) 0.0 - 149.0 mg/dL   HDL 45.40 (L) >98.11 mg/dL   VLDL 91.4 (H) 0.0 - 78.2 mg/dL   Total CHOL/HDL Ratio 4    NonHDL 96.85   Comprehensive metabolic panel  Result Value Ref Range   Sodium 139 135 - 145 mEq/L   Potassium 4.5 3.5 - 5.1 mEq/L   Chloride 104 96 - 112 mEq/L   CO2 25 19 - 32 mEq/L   Glucose, Bld 86 70 - 99 mg/dL   BUN 12 6 - 23 mg/dL   Creatinine, Ser 9.56 0.40 - 1.20 mg/dL   Total Bilirubin 0.7 0.2 - 1.2 mg/dL   Alkaline Phosphatase 71 39 - 117 U/L   AST 27 0 - 37 U/L   ALT 18 0 - 35 U/L   Total Protein 7.0 6.0 - 8.3 g/dL   Albumin 3.9 3.5 - 5.2 g/dL   GFR 21.30 >86.57 mL/min   Calcium 9.6 8.4 - 10.5 mg/dL  CBC with Differential/Platelet  Result Value Ref Range   WBC 3.4 (L) 4.0 - 10.5 K/uL   RBC 4.39 3.87 - 5.11 Mil/uL   Hemoglobin 12.4 12.0 - 15.0 g/dL   HCT 84.6 96.2 - 95.2 %   MCV 88.1 78.0 - 100.0 fl   MCHC 32.1 30.0 - 36.0 g/dL   RDW 84.1 32.4 - 40.1 %   Platelets 99.0 Repeated and verified X2. (L) 150.0 - 400.0 K/uL   Neutrophils Relative % 49.9 43.0 - 77.0 %   Lymphocytes Relative 38.3 12.0 - 46.0 %   Monocytes Relative 8.0 3.0 - 12.0 %   Eosinophils Relative 3.3 0.0 - 5.0 %   Basophils Relative 0.5 0.0 - 3.0 %   Neutro Abs 1.7 1.4 - 7.7 K/uL   Lymphs Abs 1.3 0.7 - 4.0 K/uL   Monocytes Absolute 0.3 0.1 - 1.0 K/uL   Eosinophils Absolute 0.1 0.0 - 0.7 K/uL   Basophils Absolute 0.0 0.0 - 0.1 K/uL  Protime-INR  Result Value Ref Range   INR  1.1 (H) 0.8 - 1.0 ratio   Prothrombin Time 11.5 9.6 - 13.1 sec  LDL cholesterol, direct  Result Value Ref Range   Direct LDL 61.0 mg/dL    Assessment & Plan:   Problem List Items Addressed This Visit     Health maintenance examination - Primary (Chronic)    Preventative protocols reviewed and updated unless pt declined. Discussed healthy diet and lifestyle.  Encouraged mobile mammogram today however they had left by the time pt was available.       Advanced directives, counseling/discussion (Chronic)    Previously discussed, packet previously provided      MDD (major depressive disorder), recurrent episode, moderate (HCC)    Continues effexor XR 75mg  daily in place of cymbalta (in liver disease).       Relevant Medications   venlafaxine XR (EFFEXOR XR) 75 MG 24 hr capsule   Type 2 diabetes mellitus with other specified complication (HCC)    Chronic, stable on current regimen including ozempic and metformin and insulin however she does note hypoglycemia.  15-15 hypoglycemia rule provided.  Recommended Gvoke glucagon pen - she declined.  Will drop tresiba to 90u nightly to decrease am lows. Also reviewed Novolin R use - she only uses if sugar >200, not taking mealtime dose regularly.  Reassess at 15mo DM f/u visit       Relevant Medications   Insulin Pen Needle (PEN NEEDLES) 31G X 8 MM MISC   losartan (COZAAR) 25 MG tablet   rosuvastatin (CRESTOR) 10 MG tablet   insulin degludec (TRESIBA FLEXTOUCH) 200 UNIT/ML FlexTouch Pen   metFORMIN (GLUCOPHAGE-XR) 750 MG 24 hr tablet   GERD (gastroesophageal reflux disease)    Managed with omeprazole 40mg  BID.       Relevant Medications   omeprazole (PRILOSEC) 40 MG capsule   HTN (hypertension)    Chronic, stable on current regimen - continue.       Relevant Medications   losartan (COZAAR) 25 MG tablet   propranolol (INDERAL) 20 MG tablet   rosuvastatin (CRESTOR) 10 MG tablet   spironolactone (ALDACTONE) 25 MG tablet    Dyslipidemia associated with type 2 diabetes mellitus (HCC)    Chronic on rosuvastatin 10mg  daily - continue. Reviewed diet choices to improve triglyceride levels.  The 10-year ASCVD risk score (Arnett DK, et al., 2019) is: 14.1%   Values used to calculate the score:     Age: 75 years     Sex: Female     Is Non-Hispanic African American: No     Diabetic: Yes     Tobacco smoker: No     Systolic Blood Pressure: 132 mmHg     Is BP treated: Yes     HDL Cholesterol: 37.5 mg/dL     Total Cholesterol: 134 mg/dL       Relevant Medications   losartan (COZAAR) 25 MG tablet   rosuvastatin (CRESTOR) 10 MG tablet   insulin degludec (TRESIBA FLEXTOUCH) 200 UNIT/ML FlexTouch Pen   metFORMIN (GLUCOPHAGE-XR) 750 MG 24 hr tablet   CAD S/P percutaneous coronary angioplasty    Continue aspirin, statin.       Relevant Medications   losartan (COZAAR) 25 MG tablet   propranolol (INDERAL) 20 MG tablet   rosuvastatin (CRESTOR) 10 MG tablet   spironolactone (ALDACTONE) 25 MG tablet   Type 2 diabetes mellitus with proliferative diabetic retinopathy with macular edema, bilateral (HCC)    Encouraged she schedule diabetic eye exam as due.       Relevant Medications   losartan (COZAAR) 25 MG tablet   rosuvastatin (CRESTOR) 10 MG tablet   insulin degludec (TRESIBA FLEXTOUCH) 200 UNIT/ML FlexTouch Pen   metFORMIN (GLUCOPHAGE-XR) 750 MG 24 hr tablet   Poor dentition    Overdue for dental work but having trouble finding dentist that takes insurance.       Cirrhosis of liver (HCC)    Noted on CT scan 10/2018,  with thrombocytopenia and esophageal varices with portal hypertension/gastritis on latest EGD 2020.  Presumed NAFLD however AMA levels have previously been elevated ?autoimmune hepatitis.  Last saw GI 2022, never completed recommended evaluation. Has f/u planned next month.  We also discussed she's overdue for colonoscopy.   MELD 3.0: 8 at 12/09/2022  9:02 AM MELD-Na: 7 at 12/09/2022  9:02  AM Calculated from: Serum Creatinine: 0.84 mg/dL (Using min of 1 mg/dL) at 3/66/4403  4:74 AM Serum Sodium: 139 mEq/L (Using max of 137 mEq/L) at 12/09/2022  9:02 AM Total Bilirubin: 0.7 mg/dL (Using min of 1 mg/dL) at 2/59/5638  7:56 AM Serum Albumin: 3.9 g/dL (Using max of 3.5 g/dL) at 4/33/2951  8:84 AM INR(ratio): 1.1 ratio at 12/09/2022  9:02 AM Age at listing (hypothetical): 67 years Sex: Female at 12/09/2022  9:02 AM  Fib4 score (NAFLD) = 4.18 - advanced fibrosis likely.       Thrombocytopenia (HCC)    Related to liver disease        Meds ordered this encounter  Medications   Insulin Pen Needle (PEN NEEDLES) 31G X 8 MM MISC    Sig: Use to inject Lantus as directed twice daily. Dx: E11.40    Dispense:  100 each    Refill:  3    Please fill whichever brand is covered by insurance. Thanks!   losartan (COZAAR) 25 MG tablet    Sig: Take 1 tablet (25 mg total) by mouth daily.    Dispense:  90 tablet    Refill:  4   omeprazole (PRILOSEC) 40 MG capsule    Sig: Take 1 capsule (40 mg total) by mouth 2 (two) times daily.    Dispense:  180 capsule    Refill:  4   propranolol (INDERAL) 20 MG tablet    Sig: Take 1 tablet (20 mg total) by mouth 2 (two) times daily.    Dispense:  180 tablet    Refill:  4   rosuvastatin (CRESTOR) 10 MG tablet    Sig: Take 1 tablet (10 mg total) by mouth daily.    Dispense:  90 tablet    Refill:  4   spironolactone (ALDACTONE) 25 MG tablet    Sig: Take 0.5 tablets (12.5 mg total) by mouth daily.    Dispense:  45 tablet    Refill:  4   venlafaxine XR (EFFEXOR XR) 75 MG 24 hr capsule    Sig: Take 1 capsule (75 mg total) by mouth daily with breakfast.    Dispense:  90 capsule    Refill:  4   insulin degludec (TRESIBA FLEXTOUCH) 200 UNIT/ML FlexTouch Pen    Sig: Inject 90 Units into the skin daily.    Dispense:  45 mL    Refill:  1   gabapentin (NEURONTIN) 300 MG capsule    Sig: Take 1 capsule (300 mg total) by mouth at bedtime.    Dispense:  90  capsule    Refill:  4   metFORMIN (GLUCOPHAGE-XR) 750 MG 24 hr tablet    Sig: Take 1 tablet (750 mg total) by mouth in the morning and at bedtime.    Dispense:  180 tablet    Refill:  4    No orders of the defined types were placed in this encounter.   Patient Instructions  Pass by our mobile mammography unit today for mammogram.  Keep appointment with [facility] to discuss repeat colonoscopy.  If interested, check with pharmacy about new 2 shot shingles  series (shingrix).  At your convenience schedule diabetic eye exam.  Drop tresiba to 90 units nightly.  Return in 4-6 months for diabetes follow up visit   The 15-15 rule for low sugars: If sugar reading below 70, have 15 grams of carbohydrate to raise your blood sugar and check it after 15 minutes. If it's still below 70 mg/dL, have another serving. 15 grams of carbs may be: -Glucose tablets (see instructions) -Gel tube (see instructions) -4 ounces (1/2 cup) of juice or regular soda (not diet) -1 tablespoon of sugar, honey, or corn syrup -Hard candies, jellybeans or gumdrops--see food label for how many to consume  Repeat these steps until your blood sugar is at least 70 mg/dL. Once your blood sugar is back to normal, eat a meal or snack to make sure it doesn't lower again.    Follow up plan: Return in about 4 months (around 04/17/2023), or if symptoms worsen or fail to improve, for follow up visit.  Eustaquio Boyden, MD

## 2022-12-17 NOTE — Assessment & Plan Note (Signed)
Previously discussed, packet previously provided

## 2022-12-17 NOTE — Assessment & Plan Note (Signed)
Related to liver disease.

## 2022-12-17 NOTE — Assessment & Plan Note (Signed)
Chronic, stable on current regimen - continue. 

## 2022-12-17 NOTE — Assessment & Plan Note (Signed)
Continue aspirin, statin.  

## 2022-12-17 NOTE — Assessment & Plan Note (Addendum)
Encouraged she schedule diabetic eye exam as due.

## 2022-12-17 NOTE — Assessment & Plan Note (Addendum)
Noted on CT scan 10/2018, with thrombocytopenia and esophageal varices with portal hypertension/gastritis on latest EGD 2020.  Presumed NAFLD however AMA levels have previously been elevated ?autoimmune hepatitis.  Last saw GI 2022, never completed recommended evaluation. Has f/u planned next month.  We also discussed she's overdue for colonoscopy.   MELD 3.0: 8 at 12/09/2022  9:02 AM MELD-Na: 7 at 12/09/2022  9:02 AM Calculated from: Serum Creatinine: 0.84 mg/dL (Using min of 1 mg/dL) at 7/82/9562  1:30 AM Serum Sodium: 139 mEq/L (Using max of 137 mEq/L) at 12/09/2022  9:02 AM Total Bilirubin: 0.7 mg/dL (Using min of 1 mg/dL) at 8/65/7846  9:62 AM Serum Albumin: 3.9 g/dL (Using max of 3.5 g/dL) at 9/52/8413  2:44 AM INR(ratio): 1.1 ratio at 12/09/2022  9:02 AM Age at listing (hypothetical): 67 years Sex: Female at 12/09/2022  9:02 AM  Fib4 score (NAFLD) = 4.18 - advanced fibrosis likely.

## 2022-12-17 NOTE — Assessment & Plan Note (Addendum)
Preventative protocols reviewed and updated unless pt declined. Discussed healthy diet and lifestyle.  Encouraged mobile mammogram today however they had left by the time pt was available.

## 2022-12-17 NOTE — Assessment & Plan Note (Signed)
Overdue for dental work but having trouble finding dentist that takes insurance.

## 2022-12-17 NOTE — Assessment & Plan Note (Addendum)
Chronic on rosuvastatin 10mg  daily - continue. Reviewed diet choices to improve triglyceride levels.  The 10-year ASCVD risk score (Arnett DK, et al., 2019) is: 14.1%   Values used to calculate the score:     Age: 67 years     Sex: Female     Is Non-Hispanic African American: No     Diabetic: Yes     Tobacco smoker: No     Systolic Blood Pressure: 132 mmHg     Is BP treated: Yes     HDL Cholesterol: 37.5 mg/dL     Total Cholesterol: 134 mg/dL

## 2022-12-17 NOTE — Assessment & Plan Note (Signed)
Chronic, stable on current regimen including ozempic and metformin and insulin however she does note hypoglycemia.  15-15 hypoglycemia rule provided.  Recommended Gvoke glucagon pen - she declined.  Will drop tresiba to 90u nightly to decrease am lows. Also reviewed Novolin R use - she only uses if sugar >200, not taking mealtime dose regularly.  Reassess at 21mo DM f/u visit

## 2022-12-17 NOTE — Assessment & Plan Note (Signed)
Continues effexor XR 75mg  daily in place of cymbalta (in liver disease).

## 2022-12-17 NOTE — Assessment & Plan Note (Signed)
Managed with omeprazole 40mg  BID.

## 2022-12-19 ENCOUNTER — Other Ambulatory Visit (HOSPITAL_COMMUNITY): Payer: Self-pay

## 2022-12-19 ENCOUNTER — Other Ambulatory Visit: Payer: Self-pay

## 2022-12-20 ENCOUNTER — Other Ambulatory Visit: Payer: Self-pay

## 2023-01-04 ENCOUNTER — Encounter: Payer: Self-pay | Admitting: Physician Assistant

## 2023-01-04 ENCOUNTER — Ambulatory Visit: Payer: PPO | Attending: Physician Assistant | Admitting: Physician Assistant

## 2023-01-04 VITALS — BP 98/42 | HR 67 | Ht 60.5 in | Wt 176.8 lb

## 2023-01-04 DIAGNOSIS — R002 Palpitations: Secondary | ICD-10-CM

## 2023-01-04 DIAGNOSIS — I10 Essential (primary) hypertension: Secondary | ICD-10-CM | POA: Diagnosis not present

## 2023-01-04 DIAGNOSIS — E7800 Pure hypercholesterolemia, unspecified: Secondary | ICD-10-CM | POA: Diagnosis not present

## 2023-01-04 DIAGNOSIS — Z9861 Coronary angioplasty status: Secondary | ICD-10-CM | POA: Diagnosis not present

## 2023-01-04 DIAGNOSIS — R600 Localized edema: Secondary | ICD-10-CM

## 2023-01-04 DIAGNOSIS — I471 Supraventricular tachycardia, unspecified: Secondary | ICD-10-CM | POA: Diagnosis not present

## 2023-01-04 DIAGNOSIS — I2510 Atherosclerotic heart disease of native coronary artery without angina pectoris: Secondary | ICD-10-CM

## 2023-01-04 NOTE — Assessment & Plan Note (Signed)
LDL close to optimal at 61, but triglycerides elevated at 271. Initially considered increasing Crestor dose, but decided against it due to history of cirrhosis, portal hypertension. -Continue Crestor 10mg  daily. -Advise dietary modifications to lower triglycerides.

## 2023-01-04 NOTE — Progress Notes (Signed)
Cardiology Office Note:    Date:  01/04/2023  ID:  Karen [name], DOB 05/13/1956, MRN [id number] PCP: Karen Boyden, MD  [facility] Providers Cardiologist:  Karen Constant, MD Cardiology APP:  Karen Lecher, PA-C       Patient Profile:      Coronary artery disease  STEMI in 2015 >> s/p POBA to RCA and DES to RI LHC 07/2013: Ostial RI 95; ostial OM1 30-40; mid RCA 100 Myoview 5/17: low risk  TTE 06/24/2020: EF 70-75, no RWMA, Gr 1 DD, normal RVSF, mild LAE, mod MAC, mild AV sclerosis, no AS  Hypertension  Hyperlipidemia with elevated triglycerides Diabetes mellitus  PSVT S/p RFA 6/17 Seizure d/o Cirrhosis  GERD Thrombocytopenia Carotid artery disease Carotid US 10/21/2021: R ICA 1-39; right subclavian stenosis          Discussed the use of AI scribe software for clinical note transcription with the patient, who gave verbal consent to proceed.  History of Present Illness   Karen [name], a 67 year old patient with a history of CAD, hypertension, hyperlipidemia, and cirrhosis, presents with recent heart palpitations. The patient had a ST elevation myocardial infarction in 2015, treated with angioplasty to the RCA and a drug-eluting stent to the RI. The patient also underwent RF ablation in 2017 for SVT. The palpitations have been ongoing since the ablation but have recently become more severe, similar to the pre-ablation state. The episode of palpitations occurred last month and lasted for about 5-10 minutes. The patient was able to manage the episode with deep breathing and bearing down (Valsalva). The patient has not experienced any more episodes since then. The patient also reports leg swelling, which is managed with Lasix and Spironolactone. The swelling resolves when the legs are propped up. The patient also reports a recent fall while trying to pick up one of her dogs. The patient did not feel dizzy or lightheaded during the fall. She has not had chest pain,  significant shortness of breath.    ROS:  See HPI No melena, hematochezia.    Studies Reviewed:   EKG Interpretation Date/Time:  Wednesday January 04 2023 13:55:06 EDT Ventricular Rate:  67 PR Interval:  140 QRS Duration:  80 QT Interval:  390 QTC Calculation: 412 R Axis:   22  Text Interpretation: Normal sinus rhythm Low voltage QRS Cannot rule out Anterior infarct , age undetermined Nonspecific ST and T wave abnormality No significant change since last tracing Confirmed by Karen [name] [phone]) on 01/04/2023 2:04:57 PM    Risk Assessment/Calculations:             Physical Exam:   VS:  BP (!) 98/42   Pulse 67   Ht 5' 0.5" (1.537 m)   Wt 176 lb 12.8 oz (80.2 kg)   SpO2 97%   BMI 33.96 kg/m    Wt Readings from Last 3 Encounters:  01/04/23 176 lb 12.8 oz (80.2 kg)  12/16/22 173 lb 2 oz (78.5 kg)  08/25/22 175 lb (79.4 kg)    Constitutional:      Appearance: Healthy appearance. Not in distress.  Neck:     Vascular: JVD normal.  Pulmonary:     Breath sounds: Normal breath sounds. No wheezing. No rales.  Cardiovascular:     Normal rate. Regular rhythm.     Murmurs: There is no murmur.  Edema:    Peripheral edema present.    Pretibial: bilateral trace edema of the pretibial area. Abdominal:  General: There is no distension.     Palpations: Abdomen is soft.        Assessment and Plan:  CAD S/P percutaneous coronary angioplasty Stable with no angina symptoms. Last stress test in 2017 was low risk. Last echo in 2022 showed EF 70-75%, normal wall motion, and mild diastolic dysfunction. LDL close to optimal at 61. -Continue Aspirin 81mg  daily and Crestor 10mg  daily. -Follow up 6 mos  HTN (hypertension) Controlled on current regimen. -Continue Losartan 25mg  daily, Inderal 20mg  twice daily, and Spironolactone 12.5mg  daily.  Paroxysmal SVT (supraventricular tachycardia) (HCC) One episode of palpitations last month, lasting 5-10 minutes, resolved with vagal  maneuvers. No recurrence since. History of cardiac ablation in 2017. -Check thyroid function (TSH) today. -Advise reduction in caffeine intake. -Consider cardiac monitoring if palpitations recur.  Pure hypercholesterolemia LDL close to optimal at 61, but triglycerides elevated at 271. Initially considered increasing Crestor dose, but decided against it due to history of cirrhosis, portal hypertension. -Continue Crestor 10mg  daily. -Advise dietary modifications to lower triglycerides.  Lower extremity edema Resolves with leg elevation and Spironolactone.  Likely due to venous insufficiency. -Continue Spironolactone. -Recommend use of compression stockings.           Dispo:  Return in about 6 months (around 07/07/2023) for Routine Follow Up w/ Dr. Izora [name], or Karen Newcomer, PA-C.  Signed, Karen Newcomer, PA-C

## 2023-01-04 NOTE — Assessment & Plan Note (Addendum)
Stable with no angina symptoms. Last stress test in 2017 was low risk. Last echo in 2022 showed EF 70-75%, normal wall motion, and mild diastolic dysfunction. LDL close to optimal at 61. -Continue Aspirin 81mg  daily and Crestor 10mg  daily. -Follow up 6 mos

## 2023-01-04 NOTE — Patient Instructions (Signed)
Medication Instructions:  Your physician recommends that you continue on your current medications as directed. Please refer to the Current Medication list given to you today.  *If you need a refill on your cardiac medications before your next appointment, please call your pharmacy*   Lab Work: TODAY:  TSH  If you have labs (blood work) drawn today and your tests are completely normal, you will receive your results only by: MyChart Message (if you have MyChart) OR A paper copy in the mail If you have any lab test that is abnormal or we need to change your treatment, we will call you to review the results.   Testing/Procedures: None ordered   Follow-Up: At [facility], you and your health needs are our priority.  As part of our continuing mission to provide you with exceptional heart care, we have created designated Provider Care Teams.  These Care Teams include your primary Cardiologist (physician) and Advanced Practice Providers (APPs -  Physician Assistants and Nurse Practitioners) who all work together to provide you with the care you need, when you need it.  We recommend signing up for the patient portal called "MyChart".  Sign up information is provided on this After Visit Summary.  MyChart is used to connect with patients for Virtual Visits (Telemedicine).  Patients are able to view lab/test results, encounter notes, upcoming appointments, etc.  Non-urgent messages can be sent to your provider as well.   To learn more about what you can do with MyChart, go to [email or web address].    Your next appointment:   6 month(s)  Provider:   Lesleigh Noe, MD (Inactive)  or Tereso Newcomer, PA-C         Other Instructions

## 2023-01-04 NOTE — Assessment & Plan Note (Signed)
Resolves with leg elevation and Spironolactone.  Likely due to venous insufficiency. -Continue Spironolactone. -Recommend use of compression stockings.

## 2023-01-04 NOTE — Assessment & Plan Note (Signed)
One episode of palpitations last month, lasting 5-10 minutes, resolved with vagal maneuvers. No recurrence since. History of cardiac ablation in 2017. -Check thyroid function (TSH) today. -Advise reduction in caffeine intake. -Consider cardiac monitoring if palpitations recur.

## 2023-01-04 NOTE — Assessment & Plan Note (Signed)
Controlled on current regimen. -Continue Losartan 25mg  daily, Inderal 20mg  twice daily, and Spironolactone 12.5mg  daily.

## 2023-01-05 LAB — TSH: TSH: 1.93 u[IU]/mL (ref 0.450–4.500)

## 2023-01-18 ENCOUNTER — Other Ambulatory Visit (HOSPITAL_COMMUNITY): Payer: Self-pay

## 2023-01-18 ENCOUNTER — Other Ambulatory Visit: Payer: Self-pay | Admitting: Family Medicine

## 2023-01-18 DIAGNOSIS — Z794 Long term (current) use of insulin: Secondary | ICD-10-CM

## 2023-01-19 ENCOUNTER — Other Ambulatory Visit (HOSPITAL_COMMUNITY): Payer: Self-pay

## 2023-01-19 ENCOUNTER — Other Ambulatory Visit: Payer: Self-pay

## 2023-01-19 MED ORDER — FREESTYLE LIBRE 2 SENSOR MISC
0 refills | Status: DC
Start: 2023-01-19 — End: 2023-03-23
  Filled 2023-01-19: qty 6, 90d supply, fill #0
  Filled 2023-01-23: qty 6, 84d supply, fill #0

## 2023-01-20 ENCOUNTER — Other Ambulatory Visit: Payer: Self-pay

## 2023-01-20 ENCOUNTER — Other Ambulatory Visit (HOSPITAL_COMMUNITY): Payer: Self-pay

## 2023-01-23 ENCOUNTER — Other Ambulatory Visit: Payer: Self-pay

## 2023-01-23 ENCOUNTER — Other Ambulatory Visit (HOSPITAL_COMMUNITY): Payer: Self-pay

## 2023-01-25 ENCOUNTER — Ambulatory Visit: Payer: PPO | Admitting: Physician Assistant

## 2023-01-26 ENCOUNTER — Other Ambulatory Visit: Payer: Self-pay

## 2023-02-04 ENCOUNTER — Encounter: Payer: Self-pay | Admitting: Family Medicine

## 2023-02-06 ENCOUNTER — Other Ambulatory Visit (HOSPITAL_COMMUNITY): Payer: Self-pay

## 2023-02-06 ENCOUNTER — Ambulatory Visit (INDEPENDENT_AMBULATORY_CARE_PROVIDER_SITE_OTHER): Payer: PPO | Admitting: Family Medicine

## 2023-02-06 VITALS — BP 110/68 | HR 88 | Temp 97.9°F | Ht 60.5 in | Wt 172.0 lb

## 2023-02-06 DIAGNOSIS — B029 Zoster without complications: Secondary | ICD-10-CM | POA: Diagnosis not present

## 2023-02-06 MED ORDER — GABAPENTIN 300 MG PO CAPS
300.0000 mg | ORAL_CAPSULE | Freq: Three times a day (TID) | ORAL | 4 refills | Status: DC
  Filled 2023-02-06 – 2023-02-07 (×2): qty 90, 30d supply, fill #0

## 2023-02-06 NOTE — Progress Notes (Unsigned)
Carlota Philley T. Nautica Hotz, MD, CAQ Sports Medicine [facility] [address] [place], [address]  Phone: [phone]  FAX: [phone]  Karen [name] - 67 y.o. female  MRN [id number]  Date of Birth: [birth year]  Date: 02/06/2023  PCP: Eustaquio Boyden, MD  Referral: Eustaquio Boyden, MD  Chief Complaint  Patient presents with   Rash    Rash on right side of face since Friday   Subjective:   Karen [name] is a 67 y.o. very pleasant female patient with Body mass index is 33.04 kg/m. who presents with the following:  Shingles - r head and neck  Review of Systems is noted in the HPI, as appropriate  Objective:   BP 110/68   Pulse 88   Temp 97.9 F (36.6 C) (Temporal)   Ht 5' 0.5" (1.537 m)   Wt 172 lb (78 kg)   SpO2 94%   BMI 33.04 kg/m   GEN: No acute distress; alert,appropriate. PULM: Breathing comfortably in no respiratory distress PSYCH: Normally interactive.   Laboratory and Imaging Data:  Assessment and Plan:   ***

## 2023-02-06 NOTE — Patient Instructions (Signed)
For shingles pain:   Add one extra gabapentin capsule in the morning.  If you are able to take it without getting too sleepy, then take another one during the daytime.   If you are able to tolerate it, then increase to 3 capsules a day.

## 2023-02-07 ENCOUNTER — Encounter: Payer: Self-pay | Admitting: Family Medicine

## 2023-02-07 ENCOUNTER — Other Ambulatory Visit (HOSPITAL_COMMUNITY): Payer: Self-pay

## 2023-02-08 NOTE — Telephone Encounter (Signed)
Seen by Dr Patsy Lager dx shingles, gabapentin dose increased.

## 2023-02-10 ENCOUNTER — Other Ambulatory Visit: Payer: Self-pay

## 2023-02-10 MED ORDER — LIDOCAINE 5 % EX OINT
1.0000 | TOPICAL_OINTMENT | CUTANEOUS | 0 refills | Status: DC | PRN
  Filled 2023-02-10: qty 35.44, 30d supply, fill #0

## 2023-02-10 NOTE — Addendum Note (Signed)
Addended by: Eustaquio Boyden on: 02/10/2023 09:20 AM   Modules accepted: Orders

## 2023-02-10 NOTE — Telephone Encounter (Signed)
Would offer topical lidocaine ointment vs patch - ointment sent to pharmacy.  She can use cool compresses as well.

## 2023-02-11 ENCOUNTER — Encounter: Payer: Self-pay | Admitting: Family Medicine

## 2023-02-14 ENCOUNTER — Ambulatory Visit: Payer: PPO | Admitting: Podiatry

## 2023-02-22 ENCOUNTER — Encounter: Payer: Self-pay | Admitting: Family Medicine

## 2023-02-23 ENCOUNTER — Other Ambulatory Visit (HOSPITAL_COMMUNITY): Payer: Self-pay

## 2023-02-25 ENCOUNTER — Encounter: Payer: Self-pay | Admitting: Family Medicine

## 2023-02-27 ENCOUNTER — Other Ambulatory Visit (HOSPITAL_COMMUNITY): Payer: Self-pay

## 2023-02-27 ENCOUNTER — Other Ambulatory Visit: Payer: Self-pay

## 2023-02-27 MED ORDER — PREGABALIN 75 MG PO CAPS
75.0000 mg | ORAL_CAPSULE | Freq: Two times a day (BID) | ORAL | 3 refills | Status: DC
  Filled 2023-02-27: qty 60, 30d supply, fill #0

## 2023-02-28 NOTE — Telephone Encounter (Signed)
This was addressed by Dr Patsy Lager last week.

## 2023-03-07 NOTE — Progress Notes (Unsigned)
    Karen Speece T. Karen Ravenscroft, MD, CAQ Sports Medicine Karen [name] Karen [name], [address]  Phone: [phone]  FAX: [phone]  Karen [name] - 67 y.o. female  MRN [id number]  Date of Birth: 04-06-1956  Date: 03/08/2023  PCP: Eustaquio Boyden, MD  Referral: Eustaquio Boyden, MD  No chief complaint on file.  Virtual Visit via Video Note:  I connected with  Karen [name] on 03/08/2023 12:00 PM EDT by a video enabled telemedicine application and verified that I am speaking with the correct person using two identifiers.   Location patient: home computer, tablet, or smartphone Location provider: work or home office Consent: Verbal consent directly obtained from Karen [name]. Persons participating in the virtual visit: patient, provider  I discussed the limitations of evaluation and management by telemedicine and the availability of in person appointments. The patient expressed understanding and agreed to proceed.  No chief complaint on file.   History of Present Illness:  Patient presents for follow-up on pain.  I did see her on February 06, 2023, and at that point patient was diagnosed with herpes zoster.  Initially, placed her on gabapentin, and subsequently changed her to Lyrica.    Review of Systems as above: See pertinent positives and pertinent negatives per HPI No acute distress verbally   Observations/Objective/Exam:  An attempt was made to discern vital signs over the phone and per patient if applicable and possible.   General:    Alert, Oriented, appears well and in no acute distress  Pulmonary:     On inspection no signs of respiratory distress.  Psych / Neurological:     Pleasant and cooperative.  Assessment and Plan:  No diagnosis found.   I discussed the assessment and treatment plan with the patient. The patient was provided an opportunity to ask questions and all were answered. The patient agreed with  the plan and demonstrated an understanding of the instructions.   The patient was advised to call back or seek an in-person evaluation if the symptoms worsen or if the condition fails to improve as anticipated.  Follow-up: prn unless noted otherwise below No follow-ups on file.  No orders of the defined types were placed in this encounter.  No orders of the defined types were placed in this encounter.   Signed,  Elpidio Galea. Kynslee Baham, MD

## 2023-03-08 ENCOUNTER — Other Ambulatory Visit (HOSPITAL_COMMUNITY): Payer: Self-pay

## 2023-03-08 ENCOUNTER — Telehealth (INDEPENDENT_AMBULATORY_CARE_PROVIDER_SITE_OTHER): Payer: PPO | Admitting: Family Medicine

## 2023-03-08 ENCOUNTER — Encounter: Payer: Self-pay | Admitting: Family Medicine

## 2023-03-08 ENCOUNTER — Other Ambulatory Visit: Payer: Self-pay

## 2023-03-08 VITALS — Ht 60.5 in

## 2023-03-08 DIAGNOSIS — B0229 Other postherpetic nervous system involvement: Secondary | ICD-10-CM | POA: Diagnosis not present

## 2023-03-08 MED ORDER — PREGABALIN 150 MG PO CAPS
150.0000 mg | ORAL_CAPSULE | Freq: Three times a day (TID) | ORAL | 2 refills | Status: DC
  Filled 2023-03-08: qty 90, 30d supply, fill #0

## 2023-03-10 ENCOUNTER — Ambulatory Visit (INDEPENDENT_AMBULATORY_CARE_PROVIDER_SITE_OTHER): Payer: PPO | Admitting: Family Medicine

## 2023-03-10 ENCOUNTER — Encounter: Payer: Self-pay | Admitting: Family Medicine

## 2023-03-10 ENCOUNTER — Other Ambulatory Visit (HOSPITAL_COMMUNITY): Payer: Self-pay

## 2023-03-10 VITALS — BP 100/80 | HR 76 | Temp 98.6°F | Ht 60.5 in | Wt 183.0 lb

## 2023-03-10 DIAGNOSIS — B0229 Other postherpetic nervous system involvement: Secondary | ICD-10-CM | POA: Diagnosis not present

## 2023-03-10 DIAGNOSIS — E2839 Other primary ovarian failure: Secondary | ICD-10-CM

## 2023-03-10 DIAGNOSIS — K7469 Other cirrhosis of liver: Secondary | ICD-10-CM

## 2023-03-10 MED ORDER — PREGABALIN 75 MG PO CAPS
75.0000 mg | ORAL_CAPSULE | Freq: Two times a day (BID) | ORAL | 1 refills | Status: DC
  Filled 2023-03-10: qty 120, 30d supply, fill #0

## 2023-03-10 MED ORDER — PREGABALIN 75 MG PO CAPS
75.0000 mg | ORAL_CAPSULE | Freq: Two times a day (BID) | ORAL | 1 refills | Status: DC
Start: 2023-03-10 — End: 2023-04-18

## 2023-03-10 NOTE — Patient Instructions (Addendum)
Call to schedule mammogram and bone density scan at your convenience: [facility] [phone] Keep December appointment with GI.  Let's try lyrica 75mg  1-2 twice daily, or may try 1 three times daily. Unable to send electronically at this time so prescription printed out.  Pap smear next time.  Keep December appointment

## 2023-03-10 NOTE — Assessment & Plan Note (Signed)
Upcoming GI appt 04/2023.  Discussed limiting tylenol to <1gm/day.

## 2023-03-10 NOTE — Progress Notes (Addendum)
Ph: [phone] Fax: [phone]   Patient ID: Karen [name], female    DOB: March 01, 1956, 67 y.o.   MRN: [id number]  This visit was conducted in person.  BP 100/80   Pulse 76   Temp 98.6 F (37 C) (Oral)   Ht 5' 0.5" (1.537 m)   Wt 183 lb (83 kg)   SpO2 98%   BMI 35.15 kg/m   BP Readings from Last 3 Encounters:  03/10/23 100/80  02/06/23 110/68  01/04/23 (!) 98/42    CC: shingles f/u visit  Subjective:   HPI: Karen [name] is a 67 y.o. female presenting on 03/10/2023 for Herpes Zoster (Here for shingles f/u. Dx- 02/06/23. )   See prior note for details.  Saw Dr Patsy Lager 02/06/2023 for shingles to R posterior head and neck that started ~01/29/2023, unfortunately was outside of window for antiviral treatment. Gabapentin continued at 300mg  TID.   No significant benefit with this so she was started on lyrica.  Seen in f/u 03/08/2023 for post-herpetic neuralgia with discussion on lyrica titration - currently on 150mg  BID. Post-shingles pain is doing better with lyrica - she is interested in lowering dose.   She had tried calamine lotion as well as lidocaine cream.   Lab Results  Component Value Date   HGBA1C 6.9 (H) 12/09/2022       Relevant past medical, surgical, family and social history reviewed and updated as indicated. Interim medical history since our last visit reviewed. Allergies and medications reviewed and updated. Outpatient Medications Prior to Visit  Medication Sig Dispense Refill   aspirin EC 81 MG EC tablet Take 1 tablet (81 mg total) by mouth daily.     Continuous Blood Gluc Receiver (FREESTYLE LIBRE 2 READER) DEVI Use as directed to check sugars 3-4 times daily 1 each 0   Continuous Glucose Sensor (FREESTYLE LIBRE 2 SENSOR) MISC Use as directed to check blood sugars 3-4 times daily 6 each 0   dicyclomine (BENTYL) 20 MG tablet Take 1 tablet (20 mg total) by mouth 2 (two) times daily as needed for spasms. 30 tablet 3   furosemide (LASIX) 20 MG tablet  Take 1 tablet (20 mg total) by mouth daily as needed for edema. 30 tablet 1   insulin degludec (TRESIBA FLEXTOUCH) 200 UNIT/ML FlexTouch Pen Inject 90 Units into the skin daily. 45 mL 1   Insulin Pen Needle (PEN NEEDLES) 31G X 8 MM MISC Use to inject Lantus as directed twice daily. Dx: E11.40 100 each 3   lidocaine (XYLOCAINE) 5 % ointment Apply 1 Application topically as needed. 35.44 g 0   losartan (COZAAR) 25 MG tablet Take 1 tablet (25 mg total) by mouth daily. 90 tablet 4   metFORMIN (GLUCOPHAGE-XR) 750 MG 24 hr tablet Take 1 tablet (750 mg total) by mouth in the morning and at bedtime. 180 tablet 4   mupirocin ointment (BACTROBAN) 2 % Apply 1 application topically 2 (two) times daily. 22 g 0   nitroGLYCERIN (NITROSTAT) 0.4 MG SL tablet Place 1 tablet (0.4 mg total) under the tongue every 5 (five) minutes as needed for chest pain. 25 tablet 0   NOVOLIN R 100 UNIT/ML injection INJECT 0.25ML (25 UNITS INTO THE SKIN THREE TIMES DAILY BEFORE MEALS PLUS SLIDING SCALE FOR EVERY 50 OVER 200 10 mL 0   omeprazole (PRILOSEC) 40 MG capsule Take 1 capsule (40 mg total) by mouth 2 (two) times daily. 180 capsule 4   polyethylene glycol (MIRALAX / GLYCOLAX) 17 g  packet Take 17 g by mouth daily as needed for mild constipation.     promethazine (PHENERGAN) 25 MG tablet Take 0.5-1 tablets (12.5-25 mg total) by mouth 2 (two) times daily as needed for nausea. 20 tablet 0   propranolol (INDERAL) 20 MG tablet Take 1 tablet (20 mg total) by mouth 2 (two) times daily. 180 tablet 4   rosuvastatin (CRESTOR) 10 MG tablet Take 1 tablet (10 mg total) by mouth daily. 90 tablet 4   Semaglutide, 1 MG/DOSE, 4 MG/3ML SOPN Inject 1 mg as directed once a week. 3 mL 11   spironolactone (ALDACTONE) 25 MG tablet Take 0.5 tablets (12.5 mg total) by mouth daily. 45 tablet 4   venlafaxine XR (EFFEXOR XR) 75 MG 24 hr capsule Take 1 capsule (75 mg total) by mouth daily with breakfast. 90 capsule 4   pregabalin (LYRICA) 150 MG capsule  Take 1 capsule (150 mg total) by mouth 3 (three) times daily. 90 capsule 2   acetaminophen (TYLENOL) 500 MG tablet Take 1 tablet (500 mg total) by mouth 2 (two) times daily as needed for moderate pain (pain score 4-6).     potassium chloride (KLOR-CON) 10 MEQ tablet Take 1 tablet (10 mEq total) by mouth daily as needed (with lasix). (Patient not taking: Reported on 03/10/2023) 30 tablet 3   acetaminophen (TYLENOL) 500 MG tablet Take 1,000 mg by mouth 3 (three) times daily as needed for mild pain. (Patient not taking: Reported on 02/06/2023)     lidocaine (HM LIDOCAINE PATCH) 4 % Place 1 patch onto the skin every 12 (twelve) hours as needed. (Patient not taking: Reported on 02/06/2023) 30 patch 0   No facility-administered medications prior to visit.     Per HPI unless specifically indicated in ROS section below Review of Systems  Objective:  BP 100/80   Pulse 76   Temp 98.6 F (37 C) (Oral)   Ht 5' 0.5" (1.537 m)   Wt 183 lb (83 kg)   SpO2 98%   BMI 35.15 kg/m   Wt Readings from Last 3 Encounters:  03/10/23 183 lb (83 kg)  02/06/23 172 lb (78 kg)  01/04/23 176 lb 12.8 oz (80.2 kg)      Physical Exam Vitals and nursing note reviewed.  Constitutional:      Appearance: Normal appearance. She is not ill-appearing.  HENT:     Head: Normocephalic and atraumatic.     Mouth/Throat:     Mouth: Mucous membranes are moist.     Pharynx: Oropharynx is clear. No oropharyngeal exudate or posterior oropharyngeal erythema.  Eyes:     Extraocular Movements: Extraocular movements intact.     Pupils: Pupils are equal, round, and reactive to light.  Cardiovascular:     Rate and Rhythm: Normal rate and regular rhythm.     Pulses: Normal pulses.     Heart sounds: Normal heart sounds. No murmur heard. Pulmonary:     Effort: Pulmonary effort is normal. No respiratory distress.     Breath sounds: Normal breath sounds. No wheezing, rhonchi or rales.  Musculoskeletal:     Right lower leg: No edema.      Left lower leg: No edema.  Skin:    General: Skin is warm and dry.     Findings: Rash present.          Comments: Dry scabs to R posterior neck into R occipito-parietal scalp   Neurological:     Mental Status: She is alert.  Psychiatric:  Mood and Affect: Mood normal.        Behavior: Behavior normal.       Results for orders placed or performed in visit on 01/04/23  TSH  Result Value Ref Range   TSH 1.930 0.450 - 4.500 uIU/mL   *Note: Due to a large number of results and/or encounters for the requested time period, some results have not been displayed. A complete set of results can be found in Results Review.    Assessment & Plan:   Problem List Items Addressed This Visit     Cirrhosis of liver (HCC)    Upcoming GI appt 04/2023.  Discussed limiting tylenol to <1gm/day.       Postherpetic neuralgia - Primary    Presented too late for antiviral last month.  Did have residual pain at shingles site, uncontrolled despite gabapentin, topical lidocaine, calamine lotion. Doing better on lyrica 150mg  BID. Desires to try tapering dose - will drop to 75mg  tablets and write as 3-4 per day. Unable to send electronically today - Rx printed for pt to take to her local pharmacy. Update with effect.  Discussed tylenol dosing in cirrhosis.      Other Visit Diagnoses     Estrogen deficiency       Relevant Orders   DG Bone Density        Meds ordered this encounter  Medications   pregabalin (LYRICA) 75 MG capsule    Sig: Take 1-2 capsules (75-150 mg total) by mouth 2 (two) times daily.    Dispense:  120 capsule    Refill:  1    Orders Placed This Encounter  Procedures   DG Bone Density    Standing Status:   Future    Standing Expiration Date:   03/09/2024    Order Specific Question:   Reason for Exam (SYMPTOM  OR DIAGNOSIS REQUIRED)    Answer:   osteoporosis    Order Specific Question:   Preferred imaging location?    Answer:   [facility]    Patient  Instructions  Call to schedule mammogram and bone density scan at your convenience: [facility] [phone] Keep December appointment with GI.  Let's try lyrica 75mg  1-2 twice daily, or may try 1 three times daily. Unable to send electronically at this time so prescription printed out.  Pap smear next time.  Keep December appointment  Follow up plan: Return if symptoms worsen or fail to improve.  Eustaquio Boyden, MD

## 2023-03-10 NOTE — Assessment & Plan Note (Signed)
Presented too late for antiviral last month.  Did have residual pain at shingles site, uncontrolled despite gabapentin, topical lidocaine, calamine lotion. Doing better on lyrica 150mg  BID. Desires to try tapering dose - will drop to 75mg  tablets and write as 3-4 per day. Unable to send electronically today - Rx printed for pt to take to her local pharmacy. Update with effect.  Discussed tylenol dosing in cirrhosis.

## 2023-03-23 ENCOUNTER — Other Ambulatory Visit: Payer: Self-pay

## 2023-03-23 ENCOUNTER — Encounter (HOSPITAL_COMMUNITY): Payer: Self-pay

## 2023-03-23 ENCOUNTER — Other Ambulatory Visit (HOSPITAL_COMMUNITY): Payer: Self-pay

## 2023-03-23 ENCOUNTER — Other Ambulatory Visit: Payer: Self-pay | Admitting: Family Medicine

## 2023-03-23 DIAGNOSIS — Z794 Long term (current) use of insulin: Secondary | ICD-10-CM

## 2023-03-23 MED ORDER — FREESTYLE LIBRE 2 SENSOR MISC
3 refills | Status: DC
  Filled 2023-03-23: qty 6, fill #0
  Filled 2023-03-27: qty 6, 84d supply, fill #0

## 2023-03-24 ENCOUNTER — Other Ambulatory Visit: Payer: Self-pay

## 2023-03-24 ENCOUNTER — Other Ambulatory Visit (HOSPITAL_COMMUNITY): Payer: Self-pay

## 2023-03-26 ENCOUNTER — Encounter: Payer: Self-pay | Admitting: Family Medicine

## 2023-03-27 ENCOUNTER — Other Ambulatory Visit (HOSPITAL_COMMUNITY): Payer: Self-pay

## 2023-03-27 ENCOUNTER — Encounter: Payer: Self-pay | Admitting: Family Medicine

## 2023-03-28 ENCOUNTER — Ambulatory Visit: Payer: PPO | Admitting: Podiatry

## 2023-03-30 NOTE — Telephone Encounter (Addendum)
May print and fax Rx to [facility] per pt request.  Their phone # is [phone]  Rx in Lisa's box.

## 2023-03-30 NOTE — Telephone Encounter (Signed)
See subsequent mychart message.

## 2023-03-31 MED ORDER — FREESTYLE LIBRE 3 PLUS SENSOR MISC: 6 refills | Status: DC

## 2023-03-31 NOTE — Telephone Encounter (Signed)
Noted. Faxed rx to [facility] at [phone].

## 2023-04-10 ENCOUNTER — Telehealth: Payer: Self-pay

## 2023-04-10 NOTE — Telephone Encounter (Signed)
Faxed rx to [facility] at [phone].

## 2023-04-10 NOTE — Telephone Encounter (Signed)
Records request sent for DM eye exam records.

## 2023-04-11 ENCOUNTER — Other Ambulatory Visit (HOSPITAL_COMMUNITY): Payer: Self-pay

## 2023-04-11 ENCOUNTER — Other Ambulatory Visit: Payer: Self-pay

## 2023-04-11 MED ORDER — FREESTYLE LIBRE 3 PLUS SENSOR MISC
1.0000 | 6 refills | Status: DC
  Filled 2023-04-11: qty 2, 30d supply, fill #0
  Filled 2023-05-06: qty 2, 30d supply, fill #1
  Filled 2023-06-05: qty 2, 30d supply, fill #2
  Filled 2023-07-04: qty 2, 30d supply, fill #3
  Filled 2023-07-31: qty 2, 30d supply, fill #4
  Filled 2023-08-28: qty 2, 30d supply, fill #5
  Filled 2023-09-25: qty 2, 30d supply, fill #6

## 2023-04-17 ENCOUNTER — Other Ambulatory Visit (HOSPITAL_COMMUNITY): Payer: Self-pay

## 2023-04-18 ENCOUNTER — Encounter: Payer: Self-pay | Admitting: Family Medicine

## 2023-04-18 ENCOUNTER — Other Ambulatory Visit: Payer: Self-pay

## 2023-04-18 ENCOUNTER — Ambulatory Visit (INDEPENDENT_AMBULATORY_CARE_PROVIDER_SITE_OTHER): Payer: PPO | Admitting: Family Medicine

## 2023-04-18 ENCOUNTER — Ambulatory Visit (INDEPENDENT_AMBULATORY_CARE_PROVIDER_SITE_OTHER)
Admission: RE | Admit: 2023-04-18 | Discharge: 2023-04-18 | Disposition: A | Discharge status: Home / Self Care | Payer: PPO | Source: Ambulatory Visit | Attending: Family Medicine | Admitting: Family Medicine

## 2023-04-18 VITALS — BP 122/64 | HR 66 | Temp 97.8°F | Ht 60.5 in | Wt 173.2 lb

## 2023-04-18 DIAGNOSIS — S6992XA Unspecified injury of left wrist, hand and finger(s), initial encounter: Secondary | ICD-10-CM | POA: Diagnosis not present

## 2023-04-18 DIAGNOSIS — E1169 Type 2 diabetes mellitus with other specified complication: Secondary | ICD-10-CM

## 2023-04-18 DIAGNOSIS — E113513 Type 2 diabetes mellitus with proliferative diabetic retinopathy with macular edema, bilateral: Secondary | ICD-10-CM

## 2023-04-18 DIAGNOSIS — R413 Other amnesia: Secondary | ICD-10-CM | POA: Insufficient documentation

## 2023-04-18 DIAGNOSIS — Z794 Long term (current) use of insulin: Secondary | ICD-10-CM | POA: Diagnosis not present

## 2023-04-18 DIAGNOSIS — B0229 Other postherpetic nervous system involvement: Secondary | ICD-10-CM | POA: Diagnosis not present

## 2023-04-18 DIAGNOSIS — S62629A Displaced fracture of medial phalanx of unspecified finger, initial encounter for closed fracture: Secondary | ICD-10-CM | POA: Insufficient documentation

## 2023-04-18 DIAGNOSIS — S62651A Nondisplaced fracture of medial phalanx of left index finger, initial encounter for closed fracture: Secondary | ICD-10-CM

## 2023-04-18 LAB — POCT GLYCOSYLATED HEMOGLOBIN (HGB A1C): Hemoglobin A1C: 7 % — AB (ref 4.0–5.6)

## 2023-04-18 MED ORDER — GABAPENTIN 300 MG PO CAPS
300.0000 mg | ORAL_CAPSULE | Freq: Two times a day (BID) | ORAL | 3 refills | Status: DC
  Filled 2023-04-18: qty 60, 30d supply, fill #0
  Filled 2023-05-12: qty 60, 30d supply, fill #1
  Filled 2023-06-12: qty 60, 30d supply, fill #2
  Filled 2023-07-11: qty 60, 30d supply, fill #3

## 2023-04-18 NOTE — Assessment & Plan Note (Addendum)
Check finger xray r/o fracture.  Xray consistent with proximal mid phalanx fracture involving dorsal PIP joint.  Buddy taped fingers today, recommend 1 wk f/u with sports medicine. Discussed home care.

## 2023-04-18 NOTE — Assessment & Plan Note (Addendum)
Episode of altered mental consciousness that lasted 4 days, of unclear cause. Difficulty getting clear history of recent events. Doesn't sound like stroke, syncope or seizure.  ?med related - Lyrica? Will stop, restart gabapentin. ?due to occasional missed Effexor doses.  ?transient global amnestic event - check MRI head.

## 2023-04-18 NOTE — Assessment & Plan Note (Addendum)
Chronic, stable on current regimen, overall good control as evidenced by recent A1c.  She denies recent hypoglycemia in the past month. She brings CGM to review - her friend had it outside - no hypoglycemic alarms noted.

## 2023-04-18 NOTE — Patient Instructions (Addendum)
Stop lyrica, restart gabapentin 300mg  twice daily.  Let me know if any new episodes occur.  Take all your medicines as prescribed.  Bring in sugar log/meter/monitor to all appointments.  Return in 1 month for diabetes follow up visit - with sugar readings.  Finger xray today.

## 2023-04-18 NOTE — Assessment & Plan Note (Signed)
Ongoing discomfort. Now concern Lyrica may be causing possible side effects - will return to gabapentin 300mg  BID. She has previously tolerated this well.

## 2023-04-18 NOTE — Assessment & Plan Note (Addendum)
She states she will call to schedule eye exam 05/2023 (Dr Allyne Gee [facility] specialist).

## 2023-04-18 NOTE — Progress Notes (Signed)
Ph: [phone] Fax: [phone]   Patient ID: Karen [name], female    DOB: 1956-01-11, 67 y.o.   MRN: [id number]  This visit was conducted in person.  BP 122/64   Pulse 66   Temp 97.8 F (36.6 C) (Oral)   Ht 5' 0.5" (1.537 m)   Wt 173 lb 4 oz (78.6 kg)   SpO2 97%   BMI 33.28 kg/m    CC: DM f/u visit  Subjective:   HPI: Karen [name] is a 67 y.o. female presenting on 04/18/2023 for Medical Management of Chronic Issues (Here for 4 mo DM f/u. Also, wants to discuss Lyrica and wants to return to gabapentin. )   PHN: Saw Dr Patsy Lager 02/06/2023 for shingles to R posterior head and neck that started ~01/29/2023, unfortunately was outside of window for antiviral treatment. Treated with gabapentin 300mg  TID.  No significant benefit with this so she was started on lyrica. Seen in f/u 03/08/2023 for post-herpetic neuralgia with discussion on lyrica titration - currently on 150mg  BID.  Seen in f/u 03/10/2023 and was doing better - plan was to slowly taper lyrica dose.  Currently taking lyrica 75mg  BID. This is a drop from 75/150mg  dosing previously. Wants to fully come off Lyrica.  Notes persistent R ear numbness and hearing loss, chronic.   She notes recent amnestic episodes - ie doesn't remember 4 days last week - missed Thanksgiving. She came to in her recliner on Saturday. She notes missed doses of medications in the past. No unilateral weakness, numbness, headache, vision change, slurred speech, bowel/bladder incontinence or tongue biting. No UTI symptoms, fevers/chills, cough. She also notes she had a fall early last week - tripped over her foot. No head injury. She did injure her L index finger at PIP, unable to fully bend.   Cirrhosis - upcoming GI appt later this week. Also overdue for colonoscopy.   DM - does  regularly check sugars with CGM- but didn't bring in. Peak 300, nadir 55 (last month) but rare. Compliant with antihyperglycemic regimen which includes: novolin R 25-35u  TID with meals + SSI, Tresiba 90u nightly, metformin XR 750mg  BID. She's been off ozempic for a month, just restarting 0.25mg  dose with next step to increase to 0.5mg  weekly. Goal 1mg  weekly. She has declined glucagon pen. Last diabetic eye exam DUE - planning to see in New Year 2025 Dr Allyne Gee retinologist. Glucometer brand: Deirdre Pippins 2. Last foot exam: 07/2022 - upcoming podiatry appt next week. DSME: declined.  CGM data: Freestyle Libre 2 - didn't bring monitor readings or her phone in today to review. Checked with friend who had her phone, #s reviewed: 30 day average sugar: 166, time in range 60%, hypoglycemia 2%, stage 1 hyperglycemia 28%, stage 2 hyperglycemia 10%.   Lab Results  Component Value Date   HGBA1C 7.0 (A) 04/18/2023   Diabetic Foot Exam - Simple   No data filed    Lab Results  Component Value Date   MICROALBUR 2.6 (H) 12/09/2022         Relevant past medical, surgical, family and social history reviewed and updated as indicated. Interim medical history since our last visit reviewed. Allergies and medications reviewed and updated. Outpatient Medications Prior to Visit  Medication Sig Dispense Refill   acetaminophen (TYLENOL) 500 MG tablet Take 1 tablet (500 mg total) by mouth 2 (two) times daily as needed for moderate pain (pain score 4-6).     aspirin EC 81 MG EC tablet Take  1 tablet (81 mg total) by mouth daily.     Continuous Glucose Sensor (FREESTYLE LIBRE 3 PLUS SENSOR) MISC Change sensor every 15 days. 2 each 6   Continuous Glucose Sensor (FREESTYLE LIBRE 3 PLUS SENSOR) MISC Place 1 each onto the skin every 15 days as directed 2 each 6   dicyclomine (BENTYL) 20 MG tablet Take 1 tablet (20 mg total) by mouth 2 (two) times daily as needed for spasms. 30 tablet 3   furosemide (LASIX) 20 MG tablet Take 1 tablet (20 mg total) by mouth daily as needed for edema. 30 tablet 1   insulin degludec (TRESIBA FLEXTOUCH) 200 UNIT/ML FlexTouch Pen Inject 90 Units into the skin  daily. 45 mL 1   Insulin Pen Needle (PEN NEEDLES) 31G X 8 MM MISC Use to inject Lantus as directed twice daily. Dx: E11.40 100 each 3   losartan (COZAAR) 25 MG tablet Take 1 tablet (25 mg total) by mouth daily. 90 tablet 4   metFORMIN (GLUCOPHAGE-XR) 750 MG 24 hr tablet Take 1 tablet (750 mg total) by mouth in the morning and at bedtime. 180 tablet 4   mupirocin ointment (BACTROBAN) 2 % Apply 1 application topically 2 (two) times daily. 22 g 0   nitroGLYCERIN (NITROSTAT) 0.4 MG SL tablet Place 1 tablet (0.4 mg total) under the tongue every 5 (five) minutes as needed for chest pain. 25 tablet 0   NOVOLIN R 100 UNIT/ML injection INJECT 0.25ML (25 UNITS INTO THE SKIN THREE TIMES DAILY BEFORE MEALS PLUS SLIDING SCALE FOR EVERY 50 OVER 200 10 mL 0   omeprazole (PRILOSEC) 40 MG capsule Take 1 capsule (40 mg total) by mouth 2 (two) times daily. 180 capsule 4   polyethylene glycol (MIRALAX / GLYCOLAX) 17 g packet Take 17 g by mouth daily as needed for mild constipation.     potassium chloride (KLOR-CON) 10 MEQ tablet Take 1 tablet (10 mEq total) by mouth daily as needed (with lasix). 30 tablet 3   promethazine (PHENERGAN) 25 MG tablet Take 0.5-1 tablets (12.5-25 mg total) by mouth 2 (two) times daily as needed for nausea. 20 tablet 0   propranolol (INDERAL) 20 MG tablet Take 1 tablet (20 mg total) by mouth 2 (two) times daily. 180 tablet 4   rosuvastatin (CRESTOR) 10 MG tablet Take 1 tablet (10 mg total) by mouth daily. 90 tablet 4   Semaglutide, 1 MG/DOSE, 4 MG/3ML SOPN Inject 1 mg as directed once a week. 3 mL 11   spironolactone (ALDACTONE) 25 MG tablet Take 0.5 tablets (12.5 mg total) by mouth daily. 45 tablet 4   venlafaxine XR (EFFEXOR XR) 75 MG 24 hr capsule Take 1 capsule (75 mg total) by mouth daily with breakfast. 90 capsule 4   pregabalin (LYRICA) 75 MG capsule Take 1-2 capsules (75-150 mg total) by mouth 2 (two) times daily. 120 capsule 1   pregabalin (LYRICA) 75 MG capsule Take 1-2 capsules by  mouth 2 (two) times daily. (Patient taking differently: Take 75 mg by mouth 2 (two) times daily. Takes 1 capsule in morning, then 1 capsule in evening) 120 capsule 1   lidocaine (XYLOCAINE) 5 % ointment Apply 1 Application topically as needed. 35.44 g 0   No facility-administered medications prior to visit.     Per HPI unless specifically indicated in ROS section below Review of Systems  Objective:  BP 122/64   Pulse 66   Temp 97.8 F (36.6 C) (Oral)   Ht 5' 0.5" (1.537 m)  Wt 173 lb 4 oz (78.6 kg)   SpO2 97%   BMI 33.28 kg/m   Wt Readings from Last 3 Encounters:  04/18/23 173 lb 4 oz (78.6 kg)  03/10/23 183 lb (83 kg)  02/06/23 172 lb (78 kg)      Physical Exam Vitals and nursing note reviewed.  Constitutional:      Appearance: Normal appearance. She is not ill-appearing.  Eyes:     Extraocular Movements: Extraocular movements intact.     Conjunctiva/sclera: Conjunctivae normal.     Pupils: Pupils are equal, round, and reactive to light.  Cardiovascular:     Rate and Rhythm: Normal rate and regular rhythm.     Pulses: Normal pulses.     Heart sounds: Normal heart sounds. No murmur heard. Pulmonary:     Effort: Pulmonary effort is normal. No respiratory distress.     Breath sounds: Normal breath sounds. No wheezing, rhonchi or rales.  Musculoskeletal:        General: Tenderness and signs of injury present.     Right lower leg: No edema.     Left lower leg: No edema.     Comments: L 2nd digit tender swelling at PIP with abrasions to finger, painful with axial loading   Skin:    General: Skin is warm and dry.     Findings: No rash.  Neurological:     Mental Status: She is alert.     Comments:  Numbness/diminished sensation to light touch to right face since shingles  Decreased hearing on right - chronic for years CN otherwise intact bilaterally - FTN intact, EOMI, facial nerve motor function intact, hypoglossal nerve function intact  Psychiatric:        Mood and  Affect: Mood normal.        Behavior: Behavior normal.       Results for orders placed or performed in visit on 04/18/23  POCT glycosylated hemoglobin (Hb A1C)  Result Value Ref Range   Hemoglobin A1C 7.0 (A) 4.0 - 5.6 %   HbA1c POC (<> result, manual entry)     HbA1c, POC (prediabetic range)     HbA1c, POC (controlled diabetic range)     *Note: Due to a large number of results and/or encounters for the requested time period, some results have not been displayed. A complete set of results can be found in Results Review.    Assessment & Plan:  I spent 45 minutes caring for this patient today face to face, reviewing labs, prior records from another facility, performing a medically appropriate examination and/or evaluation, counseling and educating the patient on causes of memory loss including syncope, seizure, hypoglycemia, documenting in the record and arranging for follow up imaging and sports medicine follow up for finger fracture.   Problem List Items Addressed This Visit     Type 2 diabetes mellitus with other specified complication (HCC) - Primary    Chronic, stable on current regimen, overall good control as evidenced by recent A1c.  She denies recent hypoglycemia in the past month. She brings CGM to review - her friend had it outside - no hypoglycemic alarms noted.       Relevant Orders   POCT glycosylated hemoglobin (Hb A1C) (Completed)   Type 2 diabetes mellitus with proliferative diabetic retinopathy with macular edema, bilateral (HCC)    She states she will call to schedule eye exam 05/2023 (Dr Allyne Gee Retina specialist).      Postherpetic neuralgia    Ongoing discomfort. Now  concern Lyrica may be causing possible side effects - will return to gabapentin 300mg  BID. She has previously tolerated this well.       Closed fracture of middle phalanx of finger of left hand    Check finger xray r/o fracture.  Xray consistent with proximal mid phalanx fracture involving  dorsal PIP joint.  Buddy taped fingers today, recommend 1 wk f/u with sports medicine. Discussed home care.       Amnestic state    Episode of altered mental consciousness that lasted 4 days, of unclear cause. Difficulty getting clear history of recent events. Doesn't sound like stroke, syncope or seizure.  ?med related - Lyrica? Will stop, restart gabapentin. ?due to occasional missed Effexor doses.  ?transient global amnestic event - check MRI head.       Relevant Orders   MR Brain Wo Contrast     Meds ordered this encounter  Medications   gabapentin (NEURONTIN) 300 MG capsule    Sig: Take 1 capsule (300 mg total) by mouth 2 (two) times daily.    Dispense:  60 capsule    Refill:  3    To replace lyrica    Orders Placed This Encounter  Procedures   DG Finger Index Left    Standing Status:   Future    Number of Occurrences:   1    Standing Expiration Date:   10/17/2023    Order Specific Question:   Reason for Exam (SYMPTOM  OR DIAGNOSIS REQUIRED)    Answer:   L index finger pain/swelling at PIP after fall last week    Order Specific Question:   Preferred imaging location?    Answer:   [facility]   MR Brain Wo Contrast    Standing Status:   Future    Standing Expiration Date:   04/17/2024    Order Specific Question:   What is the patient's sedation requirement?    Answer:   No Sedation    Order Specific Question:   Does the patient have a pacemaker or implanted devices?    Answer:   No    Order Specific Question:   Preferred imaging location?    Answer:   [facility] (table limit-550lbs)   POCT glycosylated hemoglobin (Hb A1C)    Patient Instructions  Stop lyrica, restart gabapentin 300mg  twice daily.  Let me know if any new episodes occur.  Take all your medicines as prescribed.  Bring in sugar log/meter/monitor to all appointments.  Return in 1 month for diabetes follow up visit - with sugar readings.  Finger xray today.   Follow up plan: Return in  about 1 month (around 05/19/2023) for follow up visit.  Eustaquio Boyden, MD

## 2023-04-20 ENCOUNTER — Ambulatory Visit: Payer: PPO | Admitting: Physician Assistant

## 2023-04-22 ENCOUNTER — Encounter: Payer: Self-pay | Admitting: *Deleted

## 2023-04-25 ENCOUNTER — Encounter: Payer: Self-pay | Admitting: Podiatry

## 2023-04-25 ENCOUNTER — Ambulatory Visit (INDEPENDENT_AMBULATORY_CARE_PROVIDER_SITE_OTHER): Payer: PPO | Admitting: Podiatry

## 2023-04-25 DIAGNOSIS — D2371 Other benign neoplasm of skin of right lower limb, including hip: Secondary | ICD-10-CM

## 2023-04-25 DIAGNOSIS — D2372 Other benign neoplasm of skin of left lower limb, including hip: Secondary | ICD-10-CM | POA: Diagnosis not present

## 2023-04-25 DIAGNOSIS — B351 Tinea unguium: Secondary | ICD-10-CM | POA: Diagnosis not present

## 2023-04-25 DIAGNOSIS — M79676 Pain in unspecified toe(s): Secondary | ICD-10-CM

## 2023-04-25 DIAGNOSIS — E1142 Type 2 diabetes mellitus with diabetic polyneuropathy: Secondary | ICD-10-CM | POA: Diagnosis not present

## 2023-04-25 NOTE — Progress Notes (Signed)
She presents today chief complaint of painful elongated toenails and calluses.  Objective: Toenails are long thick yellow dystrophic with mycotic multiple benign skin lesions bilateral forefoot.  Pulses are palpable.  No open lesions or wounds.  Assessment: Pain in limb secondary onychomycosis benign skin lesions.  Plan: Debridement of benign skin lesions debridement of toenails 1 through 5 bilateral.

## 2023-04-25 NOTE — Progress Notes (Unsigned)
    Lorene Samaan T. Alexandria Shiflett, MD, CAQ Sports Medicine [facility] at [facility] [address] [place], [address]  Phone: [phone]  FAX: [phone]  Karen [name] - 67 y.o. female  MRN [id number]  Date of Birth: 04/17/1956  Date: 04/26/2023  PCP: Eustaquio Boyden, MD  Referral: Eustaquio Boyden, MD  No chief complaint on file.  Subjective:   Karen [name] is a 66 y.o. very pleasant female patient with There is no height or weight on file to calculate BMI. who presents with the following:  [name] is a very pleasant young lady, and she presents today in follow-up after an acute proximal mid phalanx fracture.    Fractures easily visualized on the lateral film.    Review of Systems is noted in the HPI, as appropriate  Objective:   There were no vitals taken for this visit.  GEN: No acute distress; alert,appropriate. PULM: Breathing comfortably in no respiratory distress PSYCH: Normally interactive.   Laboratory and Imaging Data:  Assessment and Plan:   ***

## 2023-04-26 ENCOUNTER — Ambulatory Visit: Payer: PPO | Admitting: Family Medicine

## 2023-04-26 ENCOUNTER — Encounter: Payer: Self-pay | Admitting: Family Medicine

## 2023-04-26 VITALS — BP 112/60 | HR 70 | Temp 97.9°F | Ht 60.5 in | Wt 182.0 lb

## 2023-04-26 DIAGNOSIS — S62651D Nondisplaced fracture of medial phalanx of left index finger, subsequent encounter for fracture with routine healing: Secondary | ICD-10-CM | POA: Diagnosis not present

## 2023-04-26 DIAGNOSIS — B0229 Other postherpetic nervous system involvement: Secondary | ICD-10-CM
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
## 2023-05-06 ENCOUNTER — Other Ambulatory Visit (HOSPITAL_COMMUNITY): Payer: Self-pay

## 2023-05-08 ENCOUNTER — Other Ambulatory Visit: Payer: Self-pay

## 2023-05-08 ENCOUNTER — Other Ambulatory Visit (HOSPITAL_COMMUNITY): Payer: Self-pay

## 2023-05-22 ENCOUNTER — Ambulatory Visit: Payer: PPO | Admitting: Family Medicine

## 2023-05-22 ENCOUNTER — Telehealth: Payer: Self-pay

## 2023-05-22 NOTE — Telephone Encounter (Signed)
 Karen [name]

## 2023-05-23 NOTE — Progress Notes (Deleted)
   Karen Pelley T. Marcheta Horsey, MD, CAQ Sports Medicine [facility] at [facility] [address] [place], [address]  Phone: [phone]  FAX: [phone]  Karen [name] - 68 y.o. female  MRN [id number]  Date of Birth: [birth year]  Date: 05/24/2023  PCP: Karen Baller, MD  Referral: Karen Baller, MD  No chief complaint on file.  Subjective:   Karen [name] is a 68 y.o. very pleasant female patient with There is no height or weight on file to calculate BMI. who presents with the following:  Patient is here to follow-up about a closed nondisplaced fracture of middle phalanx of the left index finger.  I saw her initially on April 26, 2023.  I placed her in an aluminum splint when she was last in the office, and she is here to follow-up today for fracture follow-up.    Review of Systems is noted in the HPI, as appropriate  Objective:   There were no vitals taken for this visit.  GEN: No acute distress; alert,appropriate. PULM: Breathing comfortably in no respiratory distress PSYCH: Normally interactive.   Laboratory and Imaging Data:  Assessment and Plan:   ***

## 2023-05-24 ENCOUNTER — Ambulatory Visit: Payer: PPO | Admitting: Family Medicine

## 2023-05-24 DIAGNOSIS — S62651D Nondisplaced fracture of medial phalanx of left index finger, subsequent encounter for fracture with routine healing: Secondary | ICD-10-CM

## 2023-05-26 ENCOUNTER — Other Ambulatory Visit: Payer: PPO

## 2023-05-28 NOTE — Progress Notes (Deleted)
   Jerritt Cardoza T. Lindzee Gouge, MD, CAQ Sports Medicine [facility] at [facility] [address] [place], [address]  Phone: [phone]  FAX: [phone]  Karen [name] - 68 y.o. female  MRN [id number]  Date of Birth: Jan 13, 1956  Date: 05/29/2023  PCP: Rilla Baller, MD  Referral: Rilla Baller, MD  No chief complaint on file.  Subjective:   Karen [name] is a 68 y.o. very pleasant female patient with There is no height or weight on file to calculate BMI. who presents with the following:  Patient is here to follow-up about a closed nondisplaced fracture of middle phalanx of the left index finger.  I saw her initially on April 26, 2023.  I placed her in an aluminum splint when she was last in the office, and she is here to follow-up today for fracture follow-up.    Review of Systems is noted in the HPI, as appropriate  Objective:   There were no vitals taken for this visit.  GEN: No acute distress; alert,appropriate. PULM: Breathing comfortably in no respiratory distress PSYCH: Normally interactive.   Laboratory and Imaging Data:  Assessment and Plan:   ***

## 2023-05-29 ENCOUNTER — Ambulatory Visit: Payer: PPO | Admitting: Family Medicine

## 2023-05-31 ENCOUNTER — Other Ambulatory Visit (HOSPITAL_COMMUNITY): Payer: Self-pay

## 2023-06-02 ENCOUNTER — Ambulatory Visit: Payer: PPO | Admitting: Family Medicine

## 2023-06-04 NOTE — Progress Notes (Deleted)
   Karen Figg T. Maxson Oddo, MD, CAQ Sports Medicine [facility] at [facility] [address] [place], [address]  Phone: [phone]  FAX: [phone]  Karen [name] - 68 y.o. female  MRN [id number]  Date of Birth: 09-11-1955  Date: 06/05/2023  PCP: Eustaquio Boyden, MD  Referral: Eustaquio Boyden, MD  No chief complaint on file.  Subjective:   Karen [name] is a 68 y.o. very pleasant female patient with There is no height or weight on file to calculate BMI. who presents with the following:  Patient is here to follow-up about a closed nondisplaced fracture of middle phalanx of the left index finger.  I saw her initially on April 26, 2023.  I placed her in an aluminum splint when she was last in the office, and she is here to follow-up today for fracture follow-up.  I have actually had a number of appointments with her that were either rescheduled or canceled.    Review of Systems is noted in the HPI, as appropriate  Objective:   There were no vitals taken for this visit.  GEN: No acute distress; alert,appropriate. PULM: Breathing comfortably in no respiratory distress PSYCH: Normally interactive.   Laboratory and Imaging Data:  Assessment and Plan:   ***

## 2023-06-05 ENCOUNTER — Ambulatory Visit: Payer: PPO | Admitting: Family Medicine

## 2023-06-05 DIAGNOSIS — S62651D Nondisplaced fracture of medial phalanx of left index finger, subsequent encounter for fracture with routine healing: Secondary | ICD-10-CM

## 2023-06-12 ENCOUNTER — Ambulatory Visit: Payer: PPO | Admitting: Family Medicine

## 2023-06-14 ENCOUNTER — Ambulatory Visit: Payer: PPO | Admitting: Family Medicine

## 2023-06-18 NOTE — Progress Notes (Unsigned)
   Louetta Hollingshead T. Emery Binz, MD, CAQ Sports Medicine [facility] at [facility] [address] [place], [address]  Phone: [phone]  FAX: [phone]  Karen [name] - 68 y.o. female  MRN [id number]  Date of Birth: Jun 08, 1955  Date: 06/19/2023  PCP: Eustaquio Boyden, MD  Referral: Eustaquio Boyden, MD  No chief complaint on file.  Subjective:   Karen [name] is a 68 y.o. very pleasant female patient with There is no height or weight on file to calculate BMI. who presents with the following:  Patient is here to follow-up about a closed nondisplaced fracture of middle phalanx of the left index finger.  I saw her initially on April 26, 2023.  I placed her in an aluminum splint when she was last in the office, and she is here to follow-up today for fracture follow-up.  I have actually had a number of appointments with her that were either rescheduled or canceled.    Review of Systems is noted in the HPI, as appropriate  Objective:   There were no vitals taken for this visit.  GEN: No acute distress; alert,appropriate. PULM: Breathing comfortably in no respiratory distress PSYCH: Normally interactive.   Laboratory and Imaging Data:  Assessment and Plan:   ***

## 2023-06-19 ENCOUNTER — Encounter: Payer: Self-pay | Admitting: Family Medicine

## 2023-06-19 ENCOUNTER — Ambulatory Visit (INDEPENDENT_AMBULATORY_CARE_PROVIDER_SITE_OTHER): Payer: PPO | Admitting: Family Medicine

## 2023-06-19 ENCOUNTER — Ambulatory Visit (INDEPENDENT_AMBULATORY_CARE_PROVIDER_SITE_OTHER)
Admission: RE | Admit: 2023-06-19 | Discharge: 2023-06-19 | Disposition: A | Discharge status: Home / Self Care | Payer: PPO | Source: Ambulatory Visit | Attending: Family Medicine | Admitting: Family Medicine

## 2023-06-19 VITALS — BP 100/62 | HR 71 | Temp 97.9°F | Ht 60.5 in | Wt 177.4 lb

## 2023-06-19 DIAGNOSIS — S62651D Nondisplaced fracture of medial phalanx of left index finger, subsequent encounter for fracture with routine healing: Secondary | ICD-10-CM

## 2023-06-19 DIAGNOSIS — S62621D Displaced fracture of medial phalanx of left index finger, subsequent encounter for fracture with routine healing: Secondary | ICD-10-CM | POA: Diagnosis not present

## 2023-06-19 NOTE — Patient Instructions (Signed)
You have broken your finger.  You need to keep your fingers taped together for the next month

## 2023-06-21 ENCOUNTER — Encounter: Payer: Self-pay | Admitting: Family Medicine

## 2023-06-21 ENCOUNTER — Ambulatory Visit: Payer: PPO | Admitting: Family Medicine

## 2023-06-21 VITALS — BP 120/84 | HR 69 | Temp 98.1°F | Ht 60.5 in | Wt 181.0 lb

## 2023-06-21 DIAGNOSIS — Z794 Long term (current) use of insulin: Secondary | ICD-10-CM | POA: Diagnosis not present

## 2023-06-21 DIAGNOSIS — E1169 Type 2 diabetes mellitus with other specified complication: Secondary | ICD-10-CM | POA: Diagnosis not present

## 2023-06-21 DIAGNOSIS — Z23 Encounter for immunization: Secondary | ICD-10-CM | POA: Diagnosis not present

## 2023-06-21 DIAGNOSIS — B0229 Other postherpetic nervous system involvement: Secondary | ICD-10-CM | POA: Diagnosis not present

## 2023-06-21 DIAGNOSIS — R413 Other amnesia: Secondary | ICD-10-CM

## 2023-06-21 MED ORDER — GVOKE HYPOPEN 2-PACK 1 MG/0.2ML ~~LOC~~ SOAJ
1.0000 mg | Freq: Every day | SUBCUTANEOUS | 1 refills | Status: AC | PRN
  Filled 2023-06-21: qty 0.4, 1d supply, fill #0

## 2023-06-21 MED ORDER — SEMAGLUTIDE (1 MG/DOSE) 4 MG/3ML ~~LOC~~ SOPN
1.0000 mg | PEN_INJECTOR | SUBCUTANEOUS | 3 refills | Status: DC
  Filled 2023-06-21 – 2023-08-12 (×2): qty 3, 28d supply, fill #0
  Filled 2023-09-22: qty 3, 28d supply, fill #1
  Filled 2023-12-01: qty 3, 28d supply, fill #2
  Filled 2024-01-17: qty 3, 28d supply, fill #3

## 2023-06-21 NOTE — Progress Notes (Addendum)
 Ph: [phone] Fax: [phone]   Patient ID: Karen [name], female    DOB: 07/18/1955, 68 y.o.   MRN: [id number]  This visit was conducted in person.  BP 120/84   Pulse 69   Temp 98.1 F (36.7 C) (Oral)   Ht 5' 0.5 (1.537 m)   Wt 181 lb (82.1 kg)   SpO2 97%   BMI 34.77 kg/m    CC: DM f/u visit  Subjective:   HPI: Karen [name] is a 68 y.o. female presenting on 06/21/2023 for Medical Management of Chronic Issues (Here for DM f/u.)   L index finger is not buddy taped today.   Shingles 01/2023 with residual PHN - managing with gabapentin  300mg  bid.   Still has not completed brain MRI for amnestic period that lasted 4 days.  Still needs to schedule eye doctor appointment and mammogram.   DM - does regularly check sugars. Compliant with antihyperglycemic regimen which includes: tresiba  90u daily, Novolin  R 25u TID with meals plus SSI for every 50>200, metformin  XR 750mg  BID. She stopped ozempic  1mg  - to see if she could do without it. Overall tolerated well. Denies low sugars or hypoglycemic symptoms. Denies paresthesias, blurry vision. Last diabetic eye exam DUE - needs to reschedule with retina specialist. Glucometer brand: freestyle libre 3 plus. Last foot exam: 07/2022. DSME: declined. CGM data: freestyle libre 3 plus 30 day average sugar: 198, time in range 42%, hypoglycemia 2%, stage 1 hyperglycemia 34%, stage 2 hyperglycemia 22%.   Lab Results  Component Value Date   HGBA1C 7.0 (A) 04/18/2023   Diabetic Foot Exam - Simple   Simple Foot Form Diabetic Foot exam was performed with the following findings: Yes 06/21/2023 12:35 PM  Visual Inspection No deformities, no ulcerations, no other skin breakdown bilaterally: Yes Sensation Testing See comments: Yes Pulse Check See comments: Yes Comments No claudication Diminished pulses bilaterally 1+ Diminished sensation to monofilament testing     Lab Results  Component Value Date   MICROALBUR 2.6 (H) 12/09/2022          Relevant past medical, surgical, family and social history reviewed and updated as indicated. Interim medical history since our last visit reviewed. Allergies and medications reviewed and updated. Outpatient Medications Prior to Visit  Medication Sig Dispense Refill   acetaminophen  (TYLENOL ) 500 MG tablet Take 1 tablet (500 mg total) by mouth 2 (two) times daily as needed for moderate pain (pain score 4-6).     aspirin  EC 81 MG EC tablet Take 1 tablet (81 mg total) by mouth daily.     Continuous Glucose Sensor (FREESTYLE LIBRE 3 PLUS SENSOR) MISC Change sensor every 15 days. 2 each 6   Continuous Glucose Sensor (FREESTYLE LIBRE 3 PLUS SENSOR) MISC Place 1 each onto the skin every 15 days as directed 2 each 6   dicyclomine  (BENTYL ) 20 MG tablet Take 1 tablet (20 mg total) by mouth 2 (two) times daily as needed for spasms. 30 tablet 3   furosemide  (LASIX ) 20 MG tablet Take 1 tablet (20 mg total) by mouth daily as needed for edema. 30 tablet 1   gabapentin  (NEURONTIN ) 300 MG capsule Take 1 capsule (300 mg total) by mouth 2 (two) times daily. 60 capsule 3   insulin  degludec (TRESIBA  FLEXTOUCH) 200 UNIT/ML FlexTouch Pen Inject 90 Units into the skin daily. 45 mL 1   Insulin  Pen Needle (PEN NEEDLES) 31G X 8 MM MISC Use to inject Lantus  as directed twice daily. Dx: E11.40 100  each 3   losartan  (COZAAR ) 25 MG tablet Take 1 tablet (25 mg total) by mouth daily. 90 tablet 4   metFORMIN  (GLUCOPHAGE -XR) 750 MG 24 hr tablet Take 1 tablet (750 mg total) by mouth in the morning and at bedtime. 180 tablet 4   mupirocin  ointment (BACTROBAN ) 2 % Apply 1 application topically 2 (two) times daily. 22 g 0   nitroGLYCERIN  (NITROSTAT ) 0.4 MG SL tablet Place 1 tablet (0.4 mg total) under the tongue every 5 (five) minutes as needed for chest pain. 25 tablet 0   NOVOLIN  R 100 UNIT/ML injection INJECT 0.25ML (25 UNITS INTO THE SKIN THREE TIMES DAILY BEFORE MEALS PLUS SLIDING SCALE FOR EVERY 50 OVER 200 10 mL 0    omeprazole  (PRILOSEC) 40 MG capsule Take 1 capsule (40 mg total) by mouth 2 (two) times daily. 180 capsule 4   polyethylene glycol (MIRALAX / GLYCOLAX) 17 g packet Take 17 g by mouth daily as needed for mild constipation.     potassium chloride  (KLOR-CON ) 10 MEQ tablet Take 1 tablet (10 mEq total) by mouth daily as needed (with lasix ). 30 tablet 3   promethazine  (PHENERGAN ) 25 MG tablet Take 0.5-1 tablets (12.5-25 mg total) by mouth 2 (two) times daily as needed for nausea. 20 tablet 0   propranolol  (INDERAL ) 20 MG tablet Take 1 tablet (20 mg total) by mouth 2 (two) times daily. 180 tablet 4   rosuvastatin  (CRESTOR ) 10 MG tablet Take 1 tablet (10 mg total) by mouth daily. 90 tablet 4   spironolactone  (ALDACTONE ) 25 MG tablet Take 0.5 tablets (12.5 mg total) by mouth daily. 45 tablet 4   venlafaxine  XR (EFFEXOR  XR) 75 MG 24 hr capsule Take 1 capsule (75 mg total) by mouth daily with breakfast. 90 capsule 4   Semaglutide , 1 MG/DOSE, 4 MG/3ML SOPN Inject 1 mg as directed once a week. 3 mL 11   No facility-administered medications prior to visit.     Per HPI unless specifically indicated in ROS section below Review of Systems  Objective:  BP 120/84   Pulse 69   Temp 98.1 F (36.7 C) (Oral)   Ht 5' 0.5 (1.537 m)   Wt 181 lb (82.1 kg)   SpO2 97%   BMI 34.77 kg/m   Wt Readings from Last 3 Encounters:  06/21/23 181 lb (82.1 kg)  06/19/23 177 lb 6 oz (80.5 kg)  04/26/23 182 lb (82.6 kg)      Physical Exam Vitals and nursing note reviewed.  Constitutional:      Appearance: Normal appearance. She is not ill-appearing.  HENT:     Head: Normocephalic and atraumatic.     Mouth/Throat:     Mouth: Mucous membranes are dry.     Pharynx: Oropharynx is clear. No posterior oropharyngeal erythema.  Eyes:     Extraocular Movements: Extraocular movements intact.     Conjunctiva/sclera: Conjunctivae normal.     Pupils: Pupils are equal, round, and reactive to light.  Cardiovascular:     Rate  and Rhythm: Normal rate and regular rhythm.     Pulses: Normal pulses.     Heart sounds: Normal heart sounds. No murmur heard. Pulmonary:     Effort: Pulmonary effort is normal. No respiratory distress.     Breath sounds: Normal breath sounds. No wheezing, rhonchi or rales.  Musculoskeletal:     Right lower leg: No edema.     Left lower leg: No edema.     Comments: See HPI for exam  if done  Skin:    General: Skin is warm and dry.     Findings: No rash.  Neurological:     Mental Status: She is alert.  Psychiatric:        Mood and Affect: Mood normal.        Behavior: Behavior normal.       Results for orders placed or performed in visit on 04/18/23  POCT glycosylated hemoglobin (Hb A1C)   Collection Time: 04/18/23 12:48 PM  Result Value Ref Range   Hemoglobin A1C 7.0 (A) 4.0 - 5.6 %   HbA1c POC (<> result, manual entry)     HbA1c, POC (prediabetic range)     HbA1c, POC (controlled diabetic range)     *Note: Due to a large number of results and/or encounters for the requested time period, some results have not been displayed. A complete set of results can be found in Results Review.    Assessment & Plan:   Problem List Items Addressed This Visit     Type 2 diabetes mellitus with other specified complication (HCC) - Primary   Chronic, latest A1c improved at 7%  Reviewed CGM data - marked fluctuation in cbgs the last few days from 60s to 300s  Reviewed Novolin  R dosing - both mealtime correction AND sliding scale 5u for every 50>200 - recommend taking regular insulin  with every meal including bedtime snacks given overnight hyperglycemia.  15-15 hypoglycemia rules provided.  Gvoke glucagon  pen sent to pharmacy for hypoglycemia  Drop tresiba  to 88u nightly.  Encouraged she schedule retinology f/u  Foot exam today.  Recommend restart ozempic  0.5mg  weekly for 1 month then increase back to 1mg  weekly - if affordable. Sent to pharmacy.  Reassess control at 3 mo f/u visit       Relevant Medications   Glucagon  (GVOKE HYPOPEN  2-PACK) 1 MG/0.2ML SOAJ   Semaglutide , 1 MG/DOSE, 4 MG/3ML SOPN   Postherpetic neuralgia   Ongoing discomfort, managing with gabapentin  300mg  bid      Amnestic state   Discussed scheduling MRI previously ordered       Other Visit Diagnoses       Encounter for immunization       Relevant Orders   Flu Vaccine Trivalent High Dose (Fluad) (Completed)        Meds ordered this encounter  Medications   Glucagon  (GVOKE HYPOPEN  2-PACK) 1 MG/0.2ML SOAJ    Sig: Inject 1 mg into the skin daily as needed (hypoglycemia).    Dispense:  0.4 mL    Refill:  1   Semaglutide , 1 MG/DOSE, 4 MG/3ML SOPN    Sig: Inject 1 mg as directed once a week.    Dispense:  3 mL    Refill:  3    Note new dose    Orders Placed This Encounter  Procedures   Flu Vaccine Trivalent High Dose (Fluad)    Patient Instructions  Flu shot today  Restart buddy taping finger.  Continue Novolin  R, take 30 min before meals. 25 units + sliding scale (5 units for every 50 over 200). Don't take regular insulin  and not eat.  Continue tresiba , drop to 88 units nightly.  If you're going to have a night time snack, supplement with Novolin  R regular insulin .  Try to be more regular with meal times and novolin  R dosing.  Return in 3 months for diabetes follow up visit   Price out Gvoke glucagon  pen for low sugar episodes.   The 15-15 rule  for low sugars: If sugar reading below 70, have 15 grams of carbohydrate to raise your blood sugar and check it after 15 minutes. If it's still below 70 mg/dL, have another serving. 15 grams of carbs may be: -Glucose tablets (see instructions) -Gel tube (see instructions) -4 ounces (1/2 cup) of juice or regular soda (not diet) -1 tablespoon of sugar, honey, or corn syrup -Hard candies, jellybeans or gumdrops--see food label for how many to consume  Repeat these steps until your blood sugar is at least 70 mg/dL. Once your blood sugar is  back to normal, eat a meal or snack to make sure it doesn't lower again.    Follow up plan: No follow-ups on file.  Anton Blas, MD

## 2023-06-21 NOTE — Assessment & Plan Note (Addendum)
 Chronic, latest A1c improved at 7%  Reviewed CGM data - marked fluctuation in cbgs the last few days from 60s to 300s  Reviewed Novolin  R dosing - both mealtime correction AND sliding scale 5u for every 50>200 - recommend taking regular insulin  with every meal including bedtime snacks given overnight hyperglycemia.  15-15 hypoglycemia rules provided.  Gvoke glucagon  pen sent to pharmacy for hypoglycemia  Drop tresiba  to 88u nightly.  Encouraged she schedule retinology f/u  Foot exam today.  Recommend restart ozempic  0.5mg  weekly for 1 month then increase back to 1mg  weekly - if affordable. Sent to pharmacy.  Reassess control at 3 mo f/u visit

## 2023-06-21 NOTE — Assessment & Plan Note (Signed)
 Discussed scheduling MRI previously ordered

## 2023-06-21 NOTE — Patient Instructions (Addendum)
 Flu shot today  Restart buddy taping finger.  Continue Novolin  R, take 30 min before meals. 25 units + sliding scale (5 units for every 50 over 200). Don't take regular insulin  and not eat.  Continue tresiba , drop to 88 units nightly.  If you're going to have a night time snack, supplement with Novolin  R regular insulin .  Try to be more regular with meal times and novolin  R dosing.  Return in 3 months for diabetes follow up visit   Price out Gvoke glucagon  pen for low sugar episodes.   The 15-15 rule for low sugars: If sugar reading below 70, have 15 grams of carbohydrate to raise your blood sugar and check it after 15 minutes. If it's still below 70 mg/dL, have another serving. 15 grams of carbs may be: -Glucose tablets (see instructions) -Gel tube (see instructions) -4 ounces (1/2 cup) of juice or regular soda (not diet) -1 tablespoon of sugar, honey, or corn syrup -Hard candies, jellybeans or gumdrops--see food label for how many to consume  Repeat these steps until your blood sugar is at least 70 mg/dL. Once your blood sugar is back to normal, eat a meal or snack to make sure it doesn't lower again.

## 2023-06-21 NOTE — Assessment & Plan Note (Signed)
 Ongoing discomfort, managing with gabapentin  300mg  bid

## 2023-06-22 ENCOUNTER — Other Ambulatory Visit (HOSPITAL_COMMUNITY): Payer: Self-pay

## 2023-06-23 ENCOUNTER — Other Ambulatory Visit (HOSPITAL_COMMUNITY): Payer: Self-pay

## 2023-06-30 ENCOUNTER — Other Ambulatory Visit (HOSPITAL_COMMUNITY): Payer: Self-pay

## 2023-07-05 ENCOUNTER — Other Ambulatory Visit (HOSPITAL_COMMUNITY): Payer: Self-pay

## 2023-07-05 ENCOUNTER — Other Ambulatory Visit: Payer: Self-pay

## 2023-07-06 ENCOUNTER — Other Ambulatory Visit: Payer: Self-pay

## 2023-07-06 ENCOUNTER — Other Ambulatory Visit (HOSPITAL_COMMUNITY): Payer: Self-pay

## 2023-07-07 ENCOUNTER — Telehealth: Payer: Self-pay

## 2023-07-07 ENCOUNTER — Other Ambulatory Visit (HOSPITAL_COMMUNITY): Payer: Self-pay

## 2023-07-07 NOTE — Telephone Encounter (Signed)
Pharmacy Patient Advocate Encounter  Received notification from [employer] that Prior Authorization for Ozempic (1 MG/DOSE) 4MG /3ML pen-injectors has been APPROVED from 07/07/2023 to 07/06/2024   PA #/Case ID/Reference #: Not given, APPROVAL LETTER WILL BE ADDED TO CHART WHEN RECEIVED KEY [id number]

## 2023-07-09 NOTE — Progress Notes (Unsigned)
   Dawsyn Ramsaran T. Meer Reindl, MD, CAQ Sports Medicine [facility] at [facility] [address] [place], [address]  Phone: [phone]  FAX: [phone]  Karen [name] - 68 y.o. female  MRN [id number]  Date of Birth: [birth year]  Date: 07/10/2023  PCP: Eustaquio Boyden, MD  Referral: Eustaquio Boyden, MD  No chief complaint on file.  Subjective:   Karen [name] is a 68 y.o. very pleasant female patient with There is no height or weight on file to calculate BMI. who presents with the following:  Patient presents with some ongoing back, buttocks pain as well as tailbone pain after recent fall.    Review of Systems is noted in the HPI, as appropriate  Objective:   There were no vitals taken for this visit.  GEN: No acute distress; alert,appropriate. PULM: Breathing comfortably in no respiratory distress PSYCH: Normally interactive.   Laboratory and Imaging Data:  Assessment and Plan:   ***

## 2023-07-10 ENCOUNTER — Other Ambulatory Visit (HOSPITAL_COMMUNITY): Payer: Self-pay

## 2023-07-10 ENCOUNTER — Ambulatory Visit (INDEPENDENT_AMBULATORY_CARE_PROVIDER_SITE_OTHER)
Admission: RE | Admit: 2023-07-10 | Discharge: 2023-07-10 | Disposition: A | Discharge status: Home / Self Care | Payer: PPO | Source: Ambulatory Visit | Attending: Family Medicine | Admitting: Family Medicine

## 2023-07-10 ENCOUNTER — Other Ambulatory Visit: Payer: Self-pay

## 2023-07-10 ENCOUNTER — Ambulatory Visit (INDEPENDENT_AMBULATORY_CARE_PROVIDER_SITE_OTHER): Payer: PPO | Admitting: Family Medicine

## 2023-07-10 VITALS — BP 104/58 | HR 76 | Temp 97.9°F | Ht 60.5 in | Wt 187.2 lb

## 2023-07-10 DIAGNOSIS — M47816 Spondylosis without myelopathy or radiculopathy, lumbar region: Secondary | ICD-10-CM | POA: Diagnosis not present

## 2023-07-10 DIAGNOSIS — M533 Sacrococcygeal disorders, not elsewhere classified: Secondary | ICD-10-CM | POA: Diagnosis not present

## 2023-07-10 MED ORDER — POTASSIUM CHLORIDE ER 10 MEQ PO CPCR
10.0000 meq | ORAL_CAPSULE | Freq: Every day | ORAL | 3 refills | Status: DC
  Filled 2023-07-10: qty 30, 30d supply, fill #0
  Filled 2023-08-03: qty 30, 30d supply, fill #1
  Filled 2023-09-02: qty 30, 30d supply, fill #2
  Filled 2024-01-17: qty 30, 30d supply, fill #3

## 2023-07-10 MED ORDER — TRAMADOL HCL 50 MG PO TABS
50.0000 mg | ORAL_TABLET | Freq: Three times a day (TID) | ORAL | 0 refills | Status: DC | PRN
  Filled 2023-07-10: qty 20, 7d supply, fill #0

## 2023-07-11 ENCOUNTER — Encounter: Payer: Self-pay | Admitting: Family Medicine

## 2023-07-11 ENCOUNTER — Other Ambulatory Visit (HOSPITAL_COMMUNITY): Payer: Self-pay

## 2023-07-12 ENCOUNTER — Other Ambulatory Visit: Payer: Self-pay

## 2023-07-13 ENCOUNTER — Other Ambulatory Visit (HOSPITAL_COMMUNITY): Payer: Self-pay

## 2023-07-15 ENCOUNTER — Other Ambulatory Visit (HOSPITAL_COMMUNITY): Payer: Self-pay

## 2023-07-16 NOTE — Progress Notes (Deleted)
   Karen Mohon T. Neda Willenbring, MD, CAQ Sports Medicine [facility] at [facility] [address] [place], [address]  Phone: [phone]  FAX: [phone]  Karen [name] - 68 y.o. female  MRN [id number]  Date of Birth: Sep 17, 1955  Date: 07/17/2023  PCP: Eustaquio Boyden, MD  Referral: Eustaquio Boyden, MD  No chief complaint on file.  Subjective:   Karen [name] is a 68 y.o. very pleasant female patient with There is no height or weight on file to calculate BMI. who presents with the following:  Patient is here for finger fracture follow-up.  The last time I saw her for her finger 1 month ago she had not immobilized her finger at all and she was still having some persistent pain.  Also saw her recently for a fall and coccyx pain, at that point she was wearing her finger splint.    Review of Systems is noted in the HPI, as appropriate  Objective:   There were no vitals taken for this visit.  GEN: No acute distress; alert,appropriate. PULM: Breathing comfortably in no respiratory distress PSYCH: Normally interactive.   Laboratory and Imaging Data:  Assessment and Plan:   ***

## 2023-07-17 ENCOUNTER — Ambulatory Visit: Payer: PPO | Admitting: Family Medicine

## 2023-07-17 DIAGNOSIS — S62651D Nondisplaced fracture of medial phalanx of left index finger, subsequent encounter for fracture with routine healing: Secondary | ICD-10-CM

## 2023-07-25 ENCOUNTER — Ambulatory Visit: Payer: PPO | Admitting: Podiatry

## 2023-07-26 ENCOUNTER — Ambulatory Visit: Admitting: Family Medicine

## 2023-07-27 ENCOUNTER — Encounter: Payer: Self-pay | Admitting: Family Medicine

## 2023-07-31 ENCOUNTER — Other Ambulatory Visit: Payer: Self-pay | Admitting: Family Medicine

## 2023-07-31 ENCOUNTER — Other Ambulatory Visit: Payer: Self-pay

## 2023-07-31 DIAGNOSIS — E1169 Type 2 diabetes mellitus with other specified complication: Secondary | ICD-10-CM

## 2023-08-01 ENCOUNTER — Other Ambulatory Visit (HOSPITAL_COMMUNITY): Payer: Self-pay

## 2023-08-01 MED ORDER — PEN NEEDLES 31G X 8 MM MISC
3 refills | Status: DC
  Filled 2023-08-01: qty 100, 50d supply, fill #0
  Filled 2023-09-14 (×2): qty 100, 50d supply, fill #1
  Filled 2023-11-03 – 2023-11-14 (×4): qty 100, 50d supply, fill #2

## 2023-08-03 ENCOUNTER — Encounter (HOSPITAL_COMMUNITY): Payer: Self-pay

## 2023-08-03 ENCOUNTER — Emergency Department (HOSPITAL_COMMUNITY)
Admission: EM | Admit: 2023-08-03 | Discharge: 2023-08-03 | Disposition: A | Discharge status: Home / Self Care | Attending: Emergency Medicine | Admitting: Emergency Medicine

## 2023-08-03 ENCOUNTER — Emergency Department (HOSPITAL_COMMUNITY)

## 2023-08-03 ENCOUNTER — Other Ambulatory Visit: Payer: Self-pay

## 2023-08-03 DIAGNOSIS — S0990XA Unspecified injury of head, initial encounter: Secondary | ICD-10-CM

## 2023-08-03 DIAGNOSIS — J323 Chronic sphenoidal sinusitis: Secondary | ICD-10-CM | POA: Diagnosis not present

## 2023-08-03 DIAGNOSIS — W01198A Fall on same level from slipping, tripping and stumbling with subsequent striking against other object, initial encounter: Secondary | ICD-10-CM | POA: Insufficient documentation

## 2023-08-03 DIAGNOSIS — Z7982 Long term (current) use of aspirin: Secondary | ICD-10-CM | POA: Insufficient documentation

## 2023-08-03 DIAGNOSIS — S199XXA Unspecified injury of neck, initial encounter: Secondary | ICD-10-CM | POA: Diagnosis not present

## 2023-08-03 DIAGNOSIS — S0083XA Contusion of other part of head, initial encounter: Secondary | ICD-10-CM | POA: Insufficient documentation

## 2023-08-03 DIAGNOSIS — Z8616 Personal history of COVID-19: Secondary | ICD-10-CM | POA: Diagnosis not present

## 2023-08-03 DIAGNOSIS — I10 Essential (primary) hypertension: Secondary | ICD-10-CM | POA: Insufficient documentation

## 2023-08-03 DIAGNOSIS — Z7984 Long term (current) use of oral hypoglycemic drugs: Secondary | ICD-10-CM | POA: Insufficient documentation

## 2023-08-03 DIAGNOSIS — I2510 Atherosclerotic heart disease of native coronary artery without angina pectoris: Secondary | ICD-10-CM | POA: Insufficient documentation

## 2023-08-03 DIAGNOSIS — W19XXXA Unspecified fall, initial encounter: Secondary | ICD-10-CM | POA: Diagnosis not present

## 2023-08-03 DIAGNOSIS — Z79899 Other long term (current) drug therapy: Secondary | ICD-10-CM | POA: Insufficient documentation

## 2023-08-03 DIAGNOSIS — E161 Other hypoglycemia: Secondary | ICD-10-CM | POA: Diagnosis not present

## 2023-08-03 DIAGNOSIS — Z794 Long term (current) use of insulin: Secondary | ICD-10-CM | POA: Diagnosis not present

## 2023-08-03 DIAGNOSIS — R42 Dizziness and giddiness: Secondary | ICD-10-CM | POA: Diagnosis not present

## 2023-08-03 DIAGNOSIS — E162 Hypoglycemia, unspecified: Secondary | ICD-10-CM | POA: Diagnosis not present

## 2023-08-03 DIAGNOSIS — E119 Type 2 diabetes mellitus without complications: Secondary | ICD-10-CM | POA: Diagnosis not present

## 2023-08-03 NOTE — ED Triage Notes (Signed)
 BIB EMS from home for a mechanical fall. Pt hit head, no LOC, no blood thinners.
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
## 2023-08-03 NOTE — Discharge Instructions (Addendum)
 We evaluated you for your head injury.  Your head CT scans were negative.  We did not see any signs of any fractures or bleeding.  You may have a mild concussion.  If you have headaches over the next few days, this can be normal.  If you develop any new symptoms such as severe headaches, vomiting, vision changes, lightheadedness or dizziness, fainting, trouble walking, or any other new symptoms, please return to the emergency department.

## 2023-08-04 ENCOUNTER — Other Ambulatory Visit (HOSPITAL_COMMUNITY): Payer: Self-pay

## 2023-08-04 ENCOUNTER — Other Ambulatory Visit: Payer: Self-pay | Admitting: Family Medicine

## 2023-08-04 DIAGNOSIS — B0229 Other postherpetic nervous system involvement: Secondary | ICD-10-CM

## 2023-08-04 MED ORDER — GABAPENTIN 300 MG PO CAPS
300.0000 mg | ORAL_CAPSULE | Freq: Two times a day (BID) | ORAL | 3 refills | Status: DC
  Filled 2023-08-04 – 2023-08-08 (×2): qty 60, 30d supply, fill #0
  Filled 2023-09-04: qty 60, 30d supply, fill #1
  Filled 2023-10-02: qty 60, 30d supply, fill #2
  Filled 2023-10-30: qty 60, 30d supply, fill #3

## 2023-08-04 NOTE — Telephone Encounter (Signed)
 Gabapentin Last filled:  07/11/23, #60 Last OV:  06/21/23, DM f/u Next OV:  09/18/23, 3 mo DM

## 2023-08-08 ENCOUNTER — Other Ambulatory Visit (HOSPITAL_COMMUNITY): Payer: Self-pay

## 2023-08-08 ENCOUNTER — Ambulatory Visit: Admitting: Podiatry

## 2023-08-08 ENCOUNTER — Other Ambulatory Visit: Payer: Self-pay

## 2023-08-12 ENCOUNTER — Other Ambulatory Visit (HOSPITAL_COMMUNITY): Payer: Self-pay

## 2023-08-13 NOTE — Progress Notes (Deleted)
     Dow Blahnik T. Kashauna Celmer, MD, CAQ Sports Medicine [facility] at [facility] [address] [place], [address]  Phone: [phone]  FAX: [phone]  Karen [name] - 68 y.o. female  MRN [id number]  Date of Birth: 10-21-1955  Date: 08/14/2023  PCP: Eustaquio Boyden, MD  Referral: Eustaquio Boyden, MD  No chief complaint on file.  Subjective:   Karen [name] is a 68 y.o. very pleasant female patient with There is no height or weight on file to calculate BMI. who presents with the following:  Patient presents to follow-up with me on 2 issues.  She had a fall and had some coccyx pain in February, and I saw her for this on July 10, 2023.  We managed this with conservative management and supportive care for pain control.  She also has a history of finger fracture of the middle phalanx of the left index finger.  For an extended period of time, she did not really do any bracing or taping and had some persistent pain and prolonged healing.    Review of Systems is noted in the HPI, as appropriate  Objective:   There were no vitals taken for this visit.  GEN: No acute distress; alert,appropriate. PULM: Breathing comfortably in no respiratory distress PSYCH: Normally interactive.   Laboratory and Imaging Data:  Assessment and Plan:   ***

## 2023-08-14 ENCOUNTER — Ambulatory Visit: Admitting: Family Medicine

## 2023-08-14 DIAGNOSIS — S62651D Nondisplaced fracture of medial phalanx of left index finger, subsequent encounter for fracture with routine healing: Secondary | ICD-10-CM

## 2023-08-14 DIAGNOSIS — M533 Sacrococcygeal disorders, not elsewhere classified: Secondary | ICD-10-CM

## 2023-08-22 ENCOUNTER — Ambulatory Visit: Admitting: Podiatry

## 2023-08-22 NOTE — Progress Notes (Deleted)
     Lance Huaracha T. Ole Lafon, MD, CAQ Sports Medicine [facility] at [facility] [address] [place], [address]  Phone: [phone]  FAX: [phone]  Karen [name] - 68 y.o. female  MRN [id number]  Date of Birth: Jan 06, 1956  Date: 08/23/2023  PCP: Eustaquio Boyden, MD  Referral: Eustaquio Boyden, MD  No chief complaint on file.  Subjective:   Karen [name] is a 68 y.o. very pleasant female patient with There is no height or weight on file to calculate BMI. who presents with the following:  Patient presents to follow-up with me on 2 issues.  She had a fall and had some coccyx pain in February, and I saw her for this on July 10, 2023.  We managed this with conservative management and supportive care for pain control.  She also has a history of finger fracture of the middle phalanx of the left index finger.  For an extended period of time, she did not really do any bracing or taping and had some persistent pain and prolonged healing.  She has missed or canceled a few different appointments.    Review of Systems is noted in the HPI, as appropriate  Objective:   There were no vitals taken for this visit.  GEN: No acute distress; alert,appropriate. PULM: Breathing comfortably in no respiratory distress PSYCH: Normally interactive.   Laboratory and Imaging Data:  Assessment and Plan:   ***

## 2023-08-23 ENCOUNTER — Ambulatory Visit: Admitting: Family Medicine

## 2023-08-23 DIAGNOSIS — S62651D Nondisplaced fracture of medial phalanx of left index finger, subsequent encounter for fracture with routine healing: Secondary | ICD-10-CM

## 2023-08-23 DIAGNOSIS — M533 Sacrococcygeal disorders, not elsewhere classified: Secondary | ICD-10-CM

## 2023-09-03 NOTE — Progress Notes (Signed)
     Karen Czaplicki T. Layden Caterino, MD, CAQ Sports Medicine [facility] at [facility] [address] [place], [address]  Phone: [phone]  FAX: [phone]  Karen [name] - 68 y.o. female  MRN [id number]  Date of Birth: Sep 18, 1955  Date: 09/04/2023  PCP: Claire Crick, MD  Referral: Claire Crick, MD  No chief complaint on file.  Subjective:   Karen [name] is a 68 y.o. very pleasant female patient with There is no height or weight on file to calculate BMI. who presents with the following:  Ms. Chaidez had met as missed numerous appointments.  She has canceled and also no-showed.  She did have a nondisplaced middle phalanx fracture multiple months ago, she has not followed up, and the last time I saw her she had really not been using her splint or taping all that much.  She also fell and had some coccygeal pain that I saw her for a couple of months ago.    Review of Systems is noted in the HPI, as appropriate  Objective:   There were no vitals taken for this visit.  GEN: No acute distress; alert,appropriate. PULM: Breathing comfortably in no respiratory distress PSYCH: Normally interactive.   Laboratory and Imaging Data:  Assessment and Plan:   ***

## 2023-09-04 ENCOUNTER — Ambulatory Visit (INDEPENDENT_AMBULATORY_CARE_PROVIDER_SITE_OTHER): Admitting: Family Medicine

## 2023-09-04 ENCOUNTER — Encounter: Payer: Self-pay | Admitting: Family Medicine

## 2023-09-04 VITALS — BP 100/60 | HR 76 | Temp 98.1°F | Ht 60.5 in | Wt 176.1 lb

## 2023-09-04 DIAGNOSIS — M533 Sacrococcygeal disorders, not elsewhere classified: Secondary | ICD-10-CM

## 2023-09-04 DIAGNOSIS — E785 Hyperlipidemia, unspecified: Secondary | ICD-10-CM

## 2023-09-04 DIAGNOSIS — S62651D Nondisplaced fracture of medial phalanx of left index finger, subsequent encounter for fracture with routine healing: Secondary | ICD-10-CM

## 2023-09-04 DIAGNOSIS — Z794 Long term (current) use of insulin: Secondary | ICD-10-CM

## 2023-09-04 DIAGNOSIS — I639 Cerebral infarction, unspecified: Secondary | ICD-10-CM | POA: Diagnosis not present

## 2023-09-04 DIAGNOSIS — E1169 Type 2 diabetes mellitus with other specified complication: Secondary | ICD-10-CM | POA: Diagnosis not present

## 2023-09-05 DIAGNOSIS — I639 Cerebral infarction, unspecified: Secondary | ICD-10-CM | POA: Insufficient documentation

## 2023-09-05 DIAGNOSIS — Z8673 Personal history of transient ischemic attack (TIA), and cerebral infarction without residual deficits: Secondary | ICD-10-CM | POA: Insufficient documentation

## 2023-09-12 ENCOUNTER — Encounter: Payer: Self-pay | Admitting: Podiatry

## 2023-09-12 ENCOUNTER — Ambulatory Visit (INDEPENDENT_AMBULATORY_CARE_PROVIDER_SITE_OTHER): Admitting: Podiatry

## 2023-09-12 DIAGNOSIS — D2372 Other benign neoplasm of skin of left lower limb, including hip: Secondary | ICD-10-CM

## 2023-09-12 DIAGNOSIS — E1142 Type 2 diabetes mellitus with diabetic polyneuropathy: Secondary | ICD-10-CM

## 2023-09-12 DIAGNOSIS — M79676 Pain in unspecified toe(s): Secondary | ICD-10-CM | POA: Diagnosis not present

## 2023-09-12 DIAGNOSIS — B351 Tinea unguium: Secondary | ICD-10-CM

## 2023-09-12 DIAGNOSIS — D2371 Other benign neoplasm of skin of right lower limb, including hip: Secondary | ICD-10-CM | POA: Diagnosis not present

## 2023-09-12 NOTE — Progress Notes (Signed)
 She presents today chief complaint of painful elongated toenails and calluses.  Objective: Toenails are long thick yellow dystrophic with mycotic multiple benign skin lesions bilateral forefoot.  Pulses are palpable.  No open lesions or wounds.  Assessment: Pain in limb secondary onychomycosis benign skin lesions.  Plan: Debridement of benign skin lesions debridement of toenails 1 through 5 bilateral.

## 2023-09-13 ENCOUNTER — Other Ambulatory Visit (HOSPITAL_COMMUNITY): Payer: Self-pay

## 2023-09-14 ENCOUNTER — Other Ambulatory Visit (HOSPITAL_COMMUNITY): Payer: Self-pay

## 2023-09-16 ENCOUNTER — Other Ambulatory Visit: Payer: Self-pay | Admitting: Family Medicine

## 2023-09-16 ENCOUNTER — Other Ambulatory Visit (HOSPITAL_COMMUNITY): Payer: Self-pay

## 2023-09-16 DIAGNOSIS — R600 Localized edema: Secondary | ICD-10-CM

## 2023-09-18 ENCOUNTER — Other Ambulatory Visit (HOSPITAL_COMMUNITY): Payer: Self-pay

## 2023-09-18 ENCOUNTER — Ambulatory Visit: Payer: PPO | Admitting: Family Medicine

## 2023-09-18 DIAGNOSIS — E1169 Type 2 diabetes mellitus with other specified complication: Secondary | ICD-10-CM

## 2023-09-18 MED ORDER — FUROSEMIDE 20 MG PO TABS
20.0000 mg | ORAL_TABLET | Freq: Every day | ORAL | 3 refills | Status: DC | PRN
  Filled 2023-09-18: qty 30, 30d supply, fill #0

## 2023-09-22 ENCOUNTER — Other Ambulatory Visit: Payer: Self-pay | Admitting: Family Medicine

## 2023-09-22 ENCOUNTER — Other Ambulatory Visit (HOSPITAL_COMMUNITY): Payer: Self-pay

## 2023-09-22 DIAGNOSIS — Z794 Long term (current) use of insulin: Secondary | ICD-10-CM

## 2023-09-22 DIAGNOSIS — E1169 Type 2 diabetes mellitus with other specified complication: Secondary | ICD-10-CM

## 2023-09-25 ENCOUNTER — Encounter: Payer: Self-pay | Admitting: Family Medicine

## 2023-09-25 ENCOUNTER — Other Ambulatory Visit: Payer: Self-pay

## 2023-09-25 ENCOUNTER — Other Ambulatory Visit (HOSPITAL_COMMUNITY): Payer: Self-pay

## 2023-09-25 MED ORDER — INSULIN DEGLUDEC 200 UNIT/ML ~~LOC~~ SOPN
88.0000 [IU] | PEN_INJECTOR | Freq: Every day | SUBCUTANEOUS | 0 refills | Status: DC
  Filled 2023-09-25: qty 15, 34d supply, fill #0

## 2023-09-25 MED ORDER — TRAMADOL HCL 50 MG PO TABS
50.0000 mg | ORAL_TABLET | Freq: Three times a day (TID) | ORAL | 0 refills | Status: AC | PRN
  Filled 2023-09-25: qty 20, 7d supply, fill #0

## 2023-09-25 MED ORDER — INSULIN REGULAR HUMAN 100 UNIT/ML IJ SOLN
INTRAMUSCULAR | 0 refills | Status: DC
  Filled 2023-09-25: qty 10, 13d supply, fill #0

## 2023-09-25 NOTE — Telephone Encounter (Signed)
 Noted.

## 2023-09-25 NOTE — Telephone Encounter (Signed)
 Plz address in Dr Ocie Belt absence.   Last OV:  06/21/23, DM f/u Next OV:  10/30/23, 3 mo DM f/u

## 2023-09-25 NOTE — Telephone Encounter (Signed)
 Sent. Thanks.

## 2023-09-25 NOTE — Telephone Encounter (Signed)
 Last office visit 09/04/2023 for Tailbone Pain and Finger Fracture.  Last refilled 07/10/2023 for #20 with no refills.  Next Appt: 10/30/2023 with Dr. Crissie Dome for DM.

## 2023-09-25 NOTE — Telephone Encounter (Signed)
 Duplicate request (see other 09/22/23 refill note).  Unpinned rx.

## 2023-09-26 ENCOUNTER — Other Ambulatory Visit: Payer: Self-pay

## 2023-09-26 NOTE — Telephone Encounter (Signed)
 Requested refills already sent to pharmacy on 09/25/23 by Dr Vallarie Gauze (see 09/22/23 refill note)

## 2023-10-25 ENCOUNTER — Other Ambulatory Visit: Payer: Self-pay | Admitting: Family Medicine

## 2023-10-25 ENCOUNTER — Other Ambulatory Visit (HOSPITAL_COMMUNITY): Payer: Self-pay

## 2023-10-25 DIAGNOSIS — E113513 Type 2 diabetes mellitus with proliferative diabetic retinopathy with macular edema, bilateral: Secondary | ICD-10-CM

## 2023-10-25 NOTE — Telephone Encounter (Signed)
 Copied from CRM [id number]. Topic: Clinical - Medication Refill >> Oct 25, 2023  3:24 PM Leah C wrote: Medication: MISCinsulin degludec (TRESIBA ) 200 UNIT/ML FlexTouch Pen; Continuous Glucose Sensor (FREESTYLE LIBRE 3 PLUS SENSOR)  Has the patient contacted their pharmacy? Yes- Patient stated the pharmacy contacted her and that she is out of the refills for both. (Agent: If no, request that the patient contact the pharmacy for the refill. If patient does not wish to contact the pharmacy document the reason why and proceed with request.) (Agent: If yes, when and what did the pharmacy advise?)  This is the patient's preferred pharmacy:  [facility] [address] [place] [address] Phone: [phone] Fax: [phone]  Is this the correct pharmacy for this prescription? Yes If no, delete pharmacy and type the correct one.   Has the prescription been filled recently? Yes  Is the patient out of the medication? Yes  Has the patient been seen for an appointment in the last year OR does the patient have an upcoming appointment? Yes  Can we respond through MyChart? Yes  Agent: Please be advised that Rx refills may take up to 3 business days. We ask that you follow-up with your pharmacy.

## 2023-10-25 NOTE — Telephone Encounter (Signed)
 Tresiba  last rx:  09/25/23, #15 mL FreeStyle Libre 3+ sensor last rx:  03/31/23, #2 ea Last OV:  06/21/23, DM f/u Next OV:  10/30/23, 3 mo DM f/u

## 2023-10-26 ENCOUNTER — Ambulatory Visit

## 2023-10-26 VITALS — Ht 60.5 in | Wt 176.0 lb

## 2023-10-26 DIAGNOSIS — Z0000 Encounter for general adult medical examination without abnormal findings: Secondary | ICD-10-CM

## 2023-10-26 DIAGNOSIS — Z13820 Encounter for screening for osteoporosis: Secondary | ICD-10-CM

## 2023-10-26 DIAGNOSIS — Z1231 Encounter for screening mammogram for malignant neoplasm of breast: Secondary | ICD-10-CM

## 2023-10-26 DIAGNOSIS — Z1211 Encounter for screening for malignant neoplasm of colon: Secondary | ICD-10-CM

## 2023-10-26 NOTE — Progress Notes (Signed)
 Please attest and cosign this visit due to patients primary care provider not being in the office at the time the visit was completed.    Subjective:   Karen [name] is a 68 y.o. who presents for a Medicare Wellness preventive visit.  As a reminder, Annual Wellness Visits don't include a physical exam, and some assessments may be limited, especially if this visit is performed virtually. We may recommend an in-person follow-up visit with your provider if needed.  Visit Complete: Virtual I connected with  Karen [name] on 10/26/23 by a audio enabled telemedicine application and verified that I am speaking with the correct person using two identifiers.  Patient Location: Home  Provider Location: Office/Clinic  I discussed the limitations of evaluation and management by telemedicine. The patient expressed understanding and agreed to proceed.  Vital Signs: Because this visit was a virtual/telehealth visit, some criteria may be missing or patient reported. Any vitals not documented were not able to be obtained and vitals that have been documented are patient reported.  VideoDeclined- This patient declined Librarian, academic. Therefore the visit was completed with audio only.  Persons Participating in Visit: Patient.  AWV Questionnaire: Yes: Patient Medicare AWV questionnaire was completed by the patient on 10/22/23; I have confirmed that all information answered by patient is correct and no changes since this date.  Cardiac Risk Factors include: advanced age (>77men, >16 women);diabetes mellitus;dyslipidemia;hypertension;obesity (BMI >30kg/m2);sedentary lifestyle     Objective:    Today's Vitals   10/22/23 0854 10/26/23 1101  Weight:  176 lb (79.8 kg)  Height:  5' 0.5 (1.537 m)  PainSc: 7     Body mass index is 33.81 kg/m.     10/26/2023   11:18 AM 08/25/2022    1:31 PM 09/26/2021    1:58 AM 01/30/2021    9:44 PM 12/10/2020    8:16 PM 11/02/2018    9:06  PM 01/10/2018    7:04 PM  Advanced Directives  Does Patient Have a Medical Advance Directive? No No No No No No No   Would patient like information on creating a medical advance directive?  No - Patient declined   No - Patient declined No - Patient declined  No - Patient declined      Data saved with a previous flowsheet row definition    Current Medications (verified) Outpatient Encounter Medications as of 10/26/2023  Medication Sig   acetaminophen  (TYLENOL ) 500 MG tablet Take 1 tablet (500 mg total) by mouth 2 (two) times daily as needed for moderate pain (pain score 4-6).   aspirin  EC 81 MG EC tablet Take 1 tablet (81 mg total) by mouth daily.   Continuous Glucose Sensor (FREESTYLE LIBRE 3 PLUS SENSOR) MISC Change sensor every 15 days.   Continuous Glucose Sensor (FREESTYLE LIBRE 3 PLUS SENSOR) MISC Place 1 each onto the skin every 15 days as directed   dicyclomine  (BENTYL ) 20 MG tablet Take 1 tablet (20 mg total) by mouth 2 (two) times daily as needed for spasms.   furosemide  (LASIX ) 20 MG tablet Take 1 tablet (20 mg total) by mouth daily as needed for edema.   gabapentin  (NEURONTIN ) 300 MG capsule Take 1 capsule (300 mg total) by mouth 2 (two) times daily.   Glucagon  (GVOKE HYPOPEN  2-PACK) 1 MG/0.2ML SOAJ Inject 1 mg into the skin daily as needed (hypoglycemia).   Insulin  Pen Needle (PEN NEEDLES) 31G X 8 MM MISC Use to inject Lantus  as directed twice daily.  insulin  regular (NOVOLIN  R) 100 units/mL injection INJECT 25 UNITS INTO THE SKIN THREE TIMES DAILY BEFORE MEALS PLUS SLIDING SCALE FOR EVERY 50 OVER 200   losartan  (COZAAR ) 25 MG tablet Take 1 tablet (25 mg total) by mouth daily.   metFORMIN  (GLUCOPHAGE -XR) 750 MG 24 hr tablet Take 1 tablet (750 mg total) by mouth in the morning and at bedtime.   mupirocin  ointment (BACTROBAN ) 2 % Apply 1 application topically 2 (two) times daily.   nitroGLYCERIN  (NITROSTAT ) 0.4 MG SL tablet Place 1 tablet (0.4 mg total) under the tongue every 5  (five) minutes as needed for chest pain.   omeprazole  (PRILOSEC) 40 MG capsule Take 1 capsule (40 mg total) by mouth 2 (two) times daily.   polyethylene glycol (MIRALAX / GLYCOLAX) 17 g packet Take 17 g by mouth daily as needed for mild constipation.   potassium chloride  (MICRO-K ) 10 MEQ CR capsule Take 1 capsule (10 mEq total) by mouth daily.   promethazine  (PHENERGAN ) 25 MG tablet Take 0.5-1 tablets (12.5-25 mg total) by mouth 2 (two) times daily as needed for nausea.   propranolol  (INDERAL ) 20 MG tablet Take 1 tablet (20 mg total) by mouth 2 (two) times daily.   rosuvastatin  (CRESTOR ) 10 MG tablet Take 1 tablet (10 mg total) by mouth daily.   Semaglutide , 1 MG/DOSE, 4 MG/3ML SOPN Inject 1 mg as directed once a week.   spironolactone  (ALDACTONE ) 25 MG tablet Take 0.5 tablets (12.5 mg total) by mouth daily.   traMADol  (ULTRAM ) 50 MG tablet Take 1 tablet (50 mg total) by mouth every 8 (eight) hours as needed for moderate pain (pain score 4-6).   venlafaxine  XR (EFFEXOR  XR) 75 MG 24 hr capsule Take 1 capsule (75 mg total) by mouth daily with breakfast.   insulin  degludec (TRESIBA ) 200 UNIT/ML FlexTouch Pen Inject 88 Units into the skin daily.   No facility-administered encounter medications on file as of 10/26/2023.    Allergies (verified) Clindamycin/lincomycin, Erythromycin, Lipitor  [atorvastatin ], Ace inhibitors, Simvastatin , and Penicillins   History: Past Medical History:  Diagnosis Date   Allergy Not sure   Anxiety Not sure   Arthritis 2015   Body aches 03/24/2016   Resolved off simvastatin .    CAD (coronary artery disease)    STEMI 07/2013 - treated with PTCA of the RCA followed by DES to the intermediate ramus  //  Myoview 5/17 - Normal perfusion. LVEF 71% with normal wall motion. This is a low risk study.   Chronic bronchitis    Cirrhosis (HCC)    COVID-19 virus infection 11/20/2020   Depression    Diabetes (HCC)    Esophageal varices (HCC)    GERD (gastroesophageal reflux  disease)    Hammer toes of both feet    High blood pressure    History of chicken pox    History of ovarian cyst    HLD (hyperlipidemia)    Hx of colonic polyps    Myocardial infarction (HCC) 2015   Portal hypertension (HCC)    Seasonal allergies    Seizure (HCC) 1977   off AEDs   SVT (supraventricular tachycardia) (HCC) 10/2015   s/p catheter ablation   Syncopal episodes    Tachyarrhythmia    ER visits with adenosine push to control   Tubal pregnancy    Type 2 diabetes, uncontrolled, with neuropathy    dx 1993   Past Surgical History:  Procedure Laterality Date   ABLATION OF DYSRHYTHMIC FOCUS  11/06/2015   SVT   CARDIAC  CATHETERIZATION  07/2013   angioplasty to RCA and DES to ramus intermedius   COLONOSCOPY WITH ESOPHAGOGASTRODUODENOSCOPY (EGD)  04/2019   esophageal varices II, portal hypertensive gastritis, gastric polyp, H pylori neg, 12 TAs, rpt colonoscopy in 1 yr ([facility])   DILATION AND CURETTAGE OF UTERUS  1984   ECTOPIC PREGNANCY SURGERY Right 1984   tube removal 2/2 hemorrhage   ELECTROPHYSIOLOGIC STUDY N/A 11/06/2015   SVT Ablation;  Tammie Fall, MD;  Location: [facility] INVASIVE CV LAB   LEFT HEART CATHETERIZATION WITH CORONARY ANGIOGRAM N/A 07/23/2013   Procedure: LEFT HEART CATHETERIZATION WITH CORONARY ANGIOGRAM;  Surgeon: Mickiel Albany, MD;  Location: [facility] CATH LAB;  Service: Cardiovascular;  Laterality: N/A;   OVARIAN CYST SURGERY  1998   PERCUTANEOUS CORONARY STENT INTERVENTION (PCI-S) N/A 07/24/2013   Procedure: PERCUTANEOUS CORONARY STENT INTERVENTION (PCI-S);  Surgeon: Mickiel Albany, MD;  Location: [facility] CATH LAB;  Service: Cardiovascular;  Laterality: N/A;   ROTATOR CUFF REPAIR  2006   Right   SHOULDER ARTHROSCOPY Left 02/2017   LEFT shoulder arthroscopy with limited debridement, decompression/acromioplasty, open rotator cuff repair and biceps tenodesis ([facility])   SUBMANDIBULAR DUCT LIGATION Right 10/2014   R submandibular gland stone removed [facility])    TONSILLECTOMY AND ADENOIDECTOMY  1965   Family History  Problem Relation Age of Onset   Diabetes Mother    Hyperlipidemia Mother    Hypertension Mother    Stroke Mother    Coronary artery disease Mother        CABG   Heart disease Mother    Arthritis Mother    Miscarriages / [place] Mother    Vision loss Mother    Diabetes Father    Hypertension Father    Hyperlipidemia Father    Coronary artery disease Father        CABG   Congestive Heart Failure Father    Pancreatic cancer Father    Cancer Father    Arrhythmia Brother    Diabetes Brother    Diabetes Maternal Grandmother    Cirrhosis Maternal Grandmother    Diabetes Maternal Grandfather    Diabetes Paternal Grandmother    Diabetes Paternal Grandfather    Heart disease Brother    Diabetes Brother    Early death Brother    Diabetes Sister    Breast cancer Paternal Aunt    Kidney disease Neg Hx    Social History   Socioeconomic History   Marital status: Widowed    Spouse name: Not on file   Number of children: 0   Years of education: 12th grade   Highest education level: 12th grade  Occupational History   Occupation: ALF, [employer], med Gaffer: [employer]  Tobacco Use   Smoking status: Never   Smokeless tobacco: Never  Vaping Use   Vaping status: Never Used  Substance and Sexual Activity   Alcohol use: No   Drug use: No   Sexual activity: Not Currently  Other Topics Concern   Not on file  Social History Narrative   Caffeine: 6 diet sodas/day   Lives with 2 dogs.   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   Activity: no regular exercise   Diet: some water, fruits/vegetables daily    Social Drivers of Health   Financial Resource Strain: Low Risk  (10/26/2023)   Overall Financial Resource Strain (CARDIA)    Difficulty of Paying Living Expenses: Not hard at all  Food Insecurity: No Food Insecurity (10/26/2023)   Hunger Vital Sign    Worried About Running Out  of Food in the Last Year: Never true    Ran Out of Food in the Last Year: Never true  Transportation Needs: No Transportation Needs (10/26/2023)   PRAPARE - Administrator, Civil Service (Medical): No    Lack of Transportation (Non-Medical): No  Physical Activity: Inactive (10/26/2023)   Exercise Vital Sign    Days of Exercise per Week: 0 days    Minutes of Exercise per Session: 0 min  Stress: No Stress Concern Present (10/26/2023)   [employer] of Occupational Health - Occupational Stress Questionnaire    Feeling of Stress: Not at all  Social Connections: Moderately Isolated (10/26/2023)   Social Connection and Isolation Panel    Frequency of Communication with Friends and Family: More than three times a week    Frequency of Social Gatherings with Friends and Family: Three times a week    Attends Religious Services: Never    Active Member of Clubs or Organizations: Yes    Attends Banker Meetings: Never    Marital Status: Widowed    Tobacco Counseling Counseling given: Not Answered    Clinical Intake:  Pre-visit preparation completed: Yes  Pain : 0-10 Pain Score: 7  Pain Type: Chronic pain Pain Location: Knee Pain Orientation: Right Pain Descriptors / Indicators: Aching Pain Onset: More than a month ago Pain Frequency: Intermittent Pain Relieving Factors: rest, knee brace Effect of Pain on Daily Activities: limits activities  Pain Relieving Factors: rest, knee brace  BMI - recorded: 33.81 Nutritional Status: BMI > 30  Obese Nutritional Risks: None Diabetes: Yes CBG done?: Yes (BS 130 this am at home) CBG resulted in Enter/ Edit results?: No Did pt. bring in CBG monitor from home?: No  Lab Results  Component Value Date   HGBA1C 7.0 (A) 04/18/2023   HGBA1C 6.9 (H) 12/09/2022   HGBA1C 7.4 (A) 04/13/2022     How often do you need to have someone help you when you read instructions, pamphlets, or other written materials from your  doctor or pharmacy?: 1 - Never  Interpreter Needed?: No  Comments: lives alone Information entered by :: B.Milayah Krell,LPN   Activities of Daily Living     10/22/2023    8:54 AM  In your present state of health, do you have any difficulty performing the following activities:  Hearing? 1  Vision? 1  Difficulty concentrating or making decisions? 0  Walking or climbing stairs? 1  Dressing or bathing? 0  Doing errands, shopping? 0  Preparing Food and eating ? N  Using the Toilet? N  In the past six months, have you accidently leaked urine? N  Do you have problems with loss of bowel control? N  Managing your Medications? N  Managing your Finances? N  Housekeeping or managing your Housekeeping? N    Patient Care Team: Claire Crick, MD as PCP - General (Family Medicine) Jann Melody, MD as PCP - Cardiology (Cardiology) Jonathan Neighbor, [facility] (Inactive) as Pharmacist (Pharmacist) Sherwood Donath as Physician Assistant (Cardiology) [facility]  I have updated your Care Teams any recent Medical Services you may have received from other providers in the past year.     Assessment:   This is a routine wellness examination for Karen [name].  Hearing/Vision screen Hearing Screening - Comments:: Pt says she has had difficulty hearing in rt ear all her life;  hears good in lft ear Vision Screening - Comments:: Pt says she wears glasses and vision is good  Dr Elnita Hai    Goals Addressed             This Visit's Progress    patient       I would like to lower my A1C      Patient Stated       10/26/23-Maintain health.       Depression Screen     10/26/2023   11:15 AM 09/04/2023   11:53 AM 06/21/2023   12:17 PM 04/18/2023    2:05 PM 03/10/2023   10:27 AM 02/06/2023    3:37 PM 08/25/2022    1:31 PM  PHQ 2/9 Scores  PHQ - 2 Score 0 0 0 0 0 0 0  PHQ- 9 Score  6  3 0 0     Fall Risk     10/22/2023    8:54 AM 09/04/2023   11:52 AM 06/21/2023    12:17 PM 04/18/2023    2:04 PM 03/10/2023   10:27 AM  Fall Risk   Falls in the past year? 1 1 0 1 1  Number falls in past yr: 1   1 1   Injury with Fall? 1 1  1 1   Risk for fall due to : Impaired balance/gait History of fall(s)   History of fall(s)  Risk for fall due to: Comment pt says she has balance issues and dizziness with vertigo      Follow up Education provided;Falls prevention discussed Falls evaluation completed   Falls evaluation completed    MEDICARE RISK AT HOME:  Medicare Risk at Home Any stairs in or around the home?: (Patient-Rptd) No Home free of loose throw rugs in walkways, pet beds, electrical cords, etc?: (Patient-Rptd) Yes Adequate lighting in your home to reduce risk of falls?: (Patient-Rptd) Yes Life alert?: (Patient-Rptd) No Use of a cane, walker or w/c?: (Patient-Rptd) No Grab bars in the bathroom?: (Patient-Rptd) No Shower chair or bench in shower?: (Patient-Rptd) No Elevated toilet seat or a handicapped toilet?: (Patient-Rptd) No  TIMED UP AND GO:  Was the test performed?  No  Cognitive Function: 6CIT completed        10/26/2023   11:18 AM 08/25/2022    1:34 PM  6CIT Screen  What Year? 0 points 0 points  What month? 0 points 0 points  What time? 0 points 0 points  Count back from 20 0 points 0 points  Months in reverse 0 points 0 points  Repeat phrase 0 points 0 points  Total Score 0 points 0 points    Immunizations Immunization History  Administered Date(s) Administered   Fluad Trivalent(High Dose 65+) 06/21/2023   Hep A / Hep B 12/30/2020   Hepatitis A, Adult 07/05/2021   Hepatitis B, PED/ADOLESCENT 07/05/2021, 07/05/2021   Influenza,inj,Quad PF,6+ Mos 06/10/2013, 02/06/2014, 07/02/2015, 03/24/2016, 05/17/2017, 05/31/2018, 04/10/2019, 03/11/2020, 02/23/2021, 04/13/2022   PFIZER(Purple Top)SARS-COV-2 Vaccination 08/13/2019, 09/04/2019, 04/15/2020   PNEUMOCOCCAL CONJUGATE-20 10/26/2020   PPD Test 08/30/2010, 10/05/2010, 11/18/2011    Pneumococcal Polysaccharide-23 06/10/2013   Rabies, IM 07/28/2015, 07/31/2015, 08/04/2015, 08/11/2015   Tdap 07/28/2015    Screening Tests Health Maintenance  Topic Date Due   Zoster Vaccines- Shingrix (1 of 2) Never done   Colonoscopy  04/24/2020   MAMMOGRAM  02/06/2021   OPHTHALMOLOGY EXAM  03/10/2022   DEXA SCAN  Never done   COVID-19 Vaccine (4 - 2024-25 season) 01/15/2023   Medicare Annual Wellness (AWV)  08/25/2023   HEMOGLOBIN A1C  10/17/2023   Diabetic kidney evaluation - eGFR measurement  12/09/2023   Diabetic kidney evaluation - Urine ACR  12/09/2023   INFLUENZA VACCINE  12/15/2023   FOOT EXAM  06/20/2024   DTaP/Tdap/Td (2 - Td or Tdap) 07/27/2025   Pneumococcal Vaccine: 50+ Years  Completed   Hepatitis C Screening  Completed   HPV VACCINES  Aged Out   Meningococcal B Vaccine  Aged Out    Health Maintenance  Health Maintenance Due  Topic Date Due   Zoster Vaccines- Shingrix (1 of 2) Never done   Colonoscopy  04/24/2020   MAMMOGRAM  02/06/2021   OPHTHALMOLOGY EXAM  03/10/2022   DEXA SCAN  Never done   COVID-19 Vaccine (4 - 2024-25 season) 01/15/2023   Medicare Annual Wellness (AWV)  08/25/2023   HEMOGLOBIN A1C  10/17/2023   Health Maintenance Items Addressed: Mammogram ordered, DEXA ordered, Referral sent to GI for colonoscopy  Additional Screening:  Vision Screening: Recommended annual ophthalmology exams for early detection of glaucoma and other disorders of the eye. Would you like a referral to an eye doctor? No    Dental Screening: Recommended annual dental exams for proper oral hygiene  Community Resource Referral / Chronic Care Management: CRR required this visit?  No   CCM required this visit?  No   Plan:    I have personally reviewed and noted the following in the patient's chart:   Medical and social history Use of alcohol, tobacco or illicit drugs  Current medications and supplements including opioid prescriptions. Patient is currently  taking opioid prescriptions. Information provided to patient regarding non-opioid alternatives. Patient advised to discuss non-opioid treatment plan with their provider. Functional ability and status Nutritional status Physical activity Advanced directives List of other physicians Hospitalizations, surgeries, and ER visits in previous 12 months Vitals Screenings to include cognitive, depression, and falls Referrals and appointments  In addition, I have reviewed and discussed with patient certain preventive protocols, quality metrics, and best practice recommendations. A written personalized care plan for preventive services as well as general preventive health recommendations were provided to patient.   Nerissa Bannister, LPN   1/61/0960   After Visit Summary: (MyChart) Due to this being a telephonic visit, the after visit summary with patients personalized plan was offered to patient via MyChart   Notes: Nothing significant to report at this time.

## 2023-10-26 NOTE — Patient Instructions (Signed)
 Karen [name] , Thank you for taking time out of your busy schedule to complete your Annual Wellness Visit with me. I enjoyed our conversation and look forward to speaking with you again next year. I, as well as your care team,  appreciate your ongoing commitment to your health goals. Please review the following plan we discussed and let me know if I can assist you in the future. Your Game plan/ To Do List    Referrals: If you haven't heard from the office you've been referred to, please reach out to them at the phone provided.  You have an order for:  []   2D Mammogram  [x]   3D Mammogram  [x]   Bone Density     Please call for appointment:  [facility] [address] [place], [place] [address] [phone]  [facility] [address] [place], [place] [address] [phone]  [facility] [address] [place], [place] [address] [phone]  [facility] [place] [facility] D[facility] [address] [place], [place] [address] [phone]  [facility] [address] [place], [place] [address] [phone]    Make sure to wear two-piece clothing.  No lotions, powders, or deodorants the day of the appointment. Make sure to bring picture ID and insurance card.  Bring list of medications you are currently taking including any supplements.   Follow up Visits: Next Medicare AWV with our clinical staff: 10/29/24 @ 10:50am   Have you seen your provider in the last 6 months (3 months if uncontrolled diabetes)? Yes Next Office Visit with your provider: 10/30/23  Clinician Recommendations:  Aim for 30 minutes of exercise or brisk walking, 6-8 glasses of water, and 5 servings of fruits and vegetables each day.       This is a list of the screening recommended for you and due dates:  Health Maintenance  Topic Date Due   Zoster (Shingles) Vaccine (1 of 2) Never done   Colon Cancer  Screening  04/24/2020   Mammogram  02/06/2021   Eye exam for diabetics  03/10/2022   DEXA scan (bone density measurement)  Never done   COVID-19 Vaccine (4 - 2024-25 season) 01/15/2023   Medicare Annual Wellness Visit  08/25/2023   Hemoglobin A1C  10/17/2023   Yearly kidney function blood test for diabetes  12/09/2023   Yearly kidney health urinalysis for diabetes  12/09/2023   Flu Shot  12/15/2023   Complete foot exam   06/20/2024   DTaP/Tdap/Td vaccine (2 - Td or Tdap) 07/27/2025   Pneumococcal Vaccine for age over 34  Completed   Hepatitis C Screening  Completed   HPV Vaccine  Aged Out   Meningitis B Vaccine  Aged Out    Advanced directives: (Declined) Advance directive discussed with you today. Even though you declined this today, please call our office should you change your mind, and we can give you the proper paperwork for you to fill out. Advance Care Planning is important because it:  [x]  Makes sure you receive the medical care that is consistent with your values, goals, and preferences  [x]  It provides guidance to your family and loved ones and reduces their decisional burden about whether or not they are making the right decisions based on your wishes.  Follow the link provided in your after visit summary or read over the paperwork we have mailed to you to help you started getting your Advance  Directives in place. If you need assistance in completing these, please reach out to us  so that we can help you!

## 2023-10-27 ENCOUNTER — Other Ambulatory Visit (HOSPITAL_COMMUNITY): Payer: Self-pay

## 2023-10-27 MED ORDER — INSULIN DEGLUDEC 200 UNIT/ML ~~LOC~~ SOPN
88.0000 [IU] | PEN_INJECTOR | Freq: Every day | SUBCUTANEOUS | 0 refills | Status: DC
  Filled 2023-10-27: qty 15, 34d supply, fill #0

## 2023-10-27 MED ORDER — FREESTYLE LIBRE 3 PLUS SENSOR MISC
1.0000 | 6 refills | Status: DC
  Filled 2023-10-27: qty 2, 30d supply, fill #0
  Filled 2023-11-22: qty 2, 30d supply, fill #1
  Filled 2023-12-22: qty 2, 30d supply, fill #2
  Filled 2024-01-22: qty 2, 30d supply, fill #3
  Filled 2024-02-15: qty 2, 30d supply, fill #4

## 2023-10-27 NOTE — Telephone Encounter (Signed)
 ERx

## 2023-10-30 ENCOUNTER — Ambulatory Visit (INDEPENDENT_AMBULATORY_CARE_PROVIDER_SITE_OTHER): Admitting: Family Medicine

## 2023-10-30 ENCOUNTER — Other Ambulatory Visit (HOSPITAL_COMMUNITY): Payer: Self-pay

## 2023-10-30 ENCOUNTER — Encounter: Payer: Self-pay | Admitting: Family Medicine

## 2023-10-30 VITALS — BP 112/64 | HR 67 | Temp 98.2°F | Ht 60.5 in | Wt 182.2 lb

## 2023-10-30 DIAGNOSIS — Z8673 Personal history of transient ischemic attack (TIA), and cerebral infarction without residual deficits: Secondary | ICD-10-CM

## 2023-10-30 DIAGNOSIS — K219 Gastro-esophageal reflux disease without esophagitis: Secondary | ICD-10-CM | POA: Diagnosis not present

## 2023-10-30 DIAGNOSIS — Z794 Long term (current) use of insulin: Secondary | ICD-10-CM | POA: Diagnosis not present

## 2023-10-30 DIAGNOSIS — E1169 Type 2 diabetes mellitus with other specified complication: Secondary | ICD-10-CM

## 2023-10-30 LAB — POCT GLYCOSYLATED HEMOGLOBIN (HGB A1C): Hemoglobin A1C: 7.4 % — AB (ref 4.0–5.6)

## 2023-10-30 MED ORDER — DICYCLOMINE HCL 20 MG PO TABS
20.0000 mg | ORAL_TABLET | Freq: Two times a day (BID) | ORAL | 1 refills | Status: AC | PRN
  Filled 2023-10-30: qty 30, 15d supply, fill #0

## 2023-10-30 NOTE — Patient Instructions (Addendum)
 Continue current medicines. You are doing well today Return in 3 months for physical.   The 15-15 rule for low sugars: If sugar reading below 70, have 15 grams of carbohydrate to raise your blood sugar and check it after 15 minutes. If it's still below 70 mg/dL, have another serving. 15 grams of carbs may be: -Glucose tablets (see instructions) -Gel tube (see instructions) -4 ounces (1/2 cup) of juice or regular soda (not diet) -1 tablespoon of sugar, honey, or corn syrup -Hard candies, jellybeans or gumdrops--see food label for how many to consume  Repeat these steps until your blood sugar is at least 70 mg/dL. Once your blood sugar is back to normal, eat a meal or snack to make sure it doesn't lower again.

## 2023-10-30 NOTE — Assessment & Plan Note (Signed)
 Discussed this with patient.  Chronic appearing on recent head CT 07/2023 however radiology mentions unchanged from prior head CT 2022 - however there's no mention of this then.  She will continue aspirin  81mg  daily, crestor  10mg  daily and tight BP and sugar control.  She notes some dizziness/vertigo - which could be coming from this.  Again recommended she proceed with scheduling brain MRI which was ordered back in 04/2023.

## 2023-10-30 NOTE — Progress Notes (Signed)
 Ph: [phone] Fax: [phone]   Patient ID: Karen [name], female    DOB: [birth year], 68 y.o.   MRN: [id number]  This visit was conducted in person.  BP 112/64   Pulse 67   Temp 98.2 F (36.8 C) (Oral)   Ht 5' 0.5 (1.537 m)   Wt 182 lb 4 oz (82.7 kg)   SpO2 99%   BMI 35.01 kg/m    CC: DM f/u visit  Subjective:   HPI: Karen [name] is a 68 y.o. female presenting on 10/30/2023 for Medical Management of Chronic Issues (Here for DM f/u.)   Amnestic period for 4 days 04/2023 - brain MRI ordered, she never completed this. She did have subsequent head CT 07/2023 after ER visit for fall with head injury - incidentally noted chronic appearing R cerebellar infarct.   Planning to get overdue colonoscopy and mammogram, then schedule eye exam.   Notes some persistent dizziness similar to vertigo that started a few months ago - incidentally noted chronic R cerebellar stroke. Continues aspirin  81mg  daily and crestor  10mg .   DM - does  regularly check sugars - 203 this morning, then dropped to 85 without any novolin  R use. Compliant with antihyperglycemic regimen which includes: tresiba  88u daily, novolin  R 24u TID with sliding scale, metformin  XR 750mg  bid, ozempic  1mg  weekly. Denies low sugars or hypoglycemic symptoms. +  paresthesias with occ L eye blurry vision. Last diabetic eye exam OVERDUE, established with retinologist. Glucometer brand: freestyle libre 3 plus. Last foot exam: 06/2023. DSME: declines.  CGM data: freestyle libre 3 plus.  30 day average sugar: 178, time in range 55%, hypoglycemia 2%, stage 1 hyperglycemia 26%, stage 2 hyperglycemia 17%.   Lab Results  Component Value Date   HGBA1C 7.4 (A) 10/30/2023   Diabetic Foot Exam - Simple   No data filed    Lab Results  Component Value Date   MICROALBUR 2.6 (H) 12/09/2022         Relevant past medical, surgical, family and social history reviewed and updated as indicated. Interim medical history since our last  visit reviewed. Allergies and medications reviewed and updated. Outpatient Medications Prior to Visit  Medication Sig Dispense Refill   acetaminophen  (TYLENOL ) 500 MG tablet Take 1 tablet (500 mg total) by mouth 2 (two) times daily as needed for moderate pain (pain score 4-6).     aspirin  EC 81 MG EC tablet Take 1 tablet (81 mg total) by mouth daily.     Continuous Glucose Sensor (FREESTYLE LIBRE 3 PLUS SENSOR) MISC Change sensor every 15 days. 2 each 6   Continuous Glucose Sensor (FREESTYLE LIBRE 3 PLUS SENSOR) MISC Place 1 each onto the skin every 15 days as directed 2 each 6   furosemide  (LASIX ) 20 MG tablet Take 1 tablet (20 mg total) by mouth daily as needed for edema. 30 tablet 3   gabapentin  (NEURONTIN ) 300 MG capsule Take 1 capsule (300 mg total) by mouth 2 (two) times daily. 60 capsule 3   Glucagon  (GVOKE HYPOPEN  2-PACK) 1 MG/0.2ML SOAJ Inject 1 mg into the skin daily as needed (hypoglycemia). 0.4 mL 1   insulin  degludec (TRESIBA ) 200 UNIT/ML FlexTouch Pen Inject 88 Units into the skin daily. 15 mL 0   Insulin  Pen Needle (PEN NEEDLES) 31G X 8 MM MISC Use to inject Lantus  as directed twice daily. 100 each 3   insulin  regular (NOVOLIN  R) 100 units/mL injection INJECT 25 UNITS INTO THE SKIN THREE TIMES DAILY BEFORE  MEALS PLUS SLIDING SCALE FOR EVERY 50 OVER 200 10 mL 0   losartan  (COZAAR ) 25 MG tablet Take 1 tablet (25 mg total) by mouth daily. 90 tablet 4   metFORMIN  (GLUCOPHAGE -XR) 750 MG 24 hr tablet Take 1 tablet (750 mg total) by mouth in the morning and at bedtime. 180 tablet 4   mupirocin  ointment (BACTROBAN ) 2 % Apply 1 application topically 2 (two) times daily. 22 g 0   nitroGLYCERIN  (NITROSTAT ) 0.4 MG SL tablet Place 1 tablet (0.4 mg total) under the tongue every 5 (five) minutes as needed for chest pain. 25 tablet 0   omeprazole  (PRILOSEC) 40 MG capsule Take 1 capsule (40 mg total) by mouth 2 (two) times daily. 180 capsule 4   polyethylene glycol (MIRALAX / GLYCOLAX) 17 g packet  Take 17 g by mouth daily as needed for mild constipation.     potassium chloride  (MICRO-K ) 10 MEQ CR capsule Take 1 capsule (10 mEq total) by mouth daily. 30 capsule 3   promethazine  (PHENERGAN ) 25 MG tablet Take 0.5-1 tablets (12.5-25 mg total) by mouth 2 (two) times daily as needed for nausea. 20 tablet 0   propranolol  (INDERAL ) 20 MG tablet Take 1 tablet (20 mg total) by mouth 2 (two) times daily. 180 tablet 4   rosuvastatin  (CRESTOR ) 10 MG tablet Take 1 tablet (10 mg total) by mouth daily. 90 tablet 4   Semaglutide , 1 MG/DOSE, 4 MG/3ML SOPN Inject 1 mg as directed once a week. 3 mL 3   spironolactone  (ALDACTONE ) 25 MG tablet Take 0.5 tablets (12.5 mg total) by mouth daily. 45 tablet 4   traMADol  (ULTRAM ) 50 MG tablet Take 1 tablet (50 mg total) by mouth every 8 (eight) hours as needed for moderate pain (pain score 4-6). 20 tablet 0   venlafaxine  XR (EFFEXOR  XR) 75 MG 24 hr capsule Take 1 capsule (75 mg total) by mouth daily with breakfast. 90 capsule 4   dicyclomine  (BENTYL ) 20 MG tablet Take 1 tablet (20 mg total) by mouth 2 (two) times daily as needed for spasms. 30 tablet 3   No facility-administered medications prior to visit.     Per HPI unless specifically indicated in ROS section below Review of Systems  Objective:  BP 112/64   Pulse 67   Temp 98.2 F (36.8 C) (Oral)   Ht 5' 0.5 (1.537 m)   Wt 182 lb 4 oz (82.7 kg)   SpO2 99%   BMI 35.01 kg/m   Wt Readings from Last 3 Encounters:  10/30/23 182 lb 4 oz (82.7 kg)  10/26/23 176 lb (79.8 kg)  09/04/23 176 lb 2 oz (79.9 kg)      Physical Exam Vitals and nursing note reviewed.  Constitutional:      Appearance: Normal appearance. She is not ill-appearing.  HENT:     Head: Normocephalic and atraumatic.     Mouth/Throat:     Mouth: Mucous membranes are moist.     Pharynx: Oropharynx is clear. No oropharyngeal exudate or posterior oropharyngeal erythema.   Eyes:     Extraocular Movements: Extraocular movements intact.      Conjunctiva/sclera: Conjunctivae normal.     Pupils: Pupils are equal, round, and reactive to light.    Cardiovascular:     Rate and Rhythm: Normal rate and regular rhythm.     Pulses: Normal pulses.     Heart sounds: Normal heart sounds. No murmur heard. Pulmonary:     Effort: Pulmonary effort is normal. No respiratory distress.  Breath sounds: Normal breath sounds. No wheezing, rhonchi or rales.   Musculoskeletal:     Right lower leg: No edema.     Left lower leg: No edema.   Skin:    General: Skin is warm and dry.     Findings: No rash.   Neurological:     Mental Status: She is alert.     Comments:  CN grossly intact Steady ambulation   Psychiatric:        Mood and Affect: Mood normal.        Behavior: Behavior normal.       Results for orders placed or performed in visit on 10/30/23  POCT glycosylated hemoglobin (Hb A1C)   Collection Time: 10/30/23 12:26 PM  Result Value Ref Range   Hemoglobin A1C 7.4 (A) 4.0 - 5.6 %   HbA1c POC (<> result, manual entry)     HbA1c, POC (prediabetic range)     HbA1c, POC (controlled diabetic range)     *Note: Due to a large number of results and/or encounters for the requested time period, some results have not been displayed. A complete set of results can be found in Results Review.   Lab Results  Component Value Date   CHOL 134 12/09/2022   HDL 37.50 (L) 12/09/2022   LDLCALC 47 08/12/2021   LDLDIRECT 61.0 12/09/2022   TRIG 271.0 (H) 12/09/2022   CHOLHDL 4 12/09/2022   Assessment & Plan:   Problem List Items Addressed This Visit     Type 2 diabetes mellitus with other specified complication (HCC) - Primary   Chronic, overall stable with A1c 7.4%.  She is tolerating ozempic  1mg  weekly well - continue this along with her metformin  and insulin  regimen.  She is overdue for retina f/u. She declines diabetes classes.       Relevant Orders   POCT glycosylated hemoglobin (Hb A1C) (Completed)   GERD (gastroesophageal  reflux disease)   Relevant Medications   dicyclomine  (BENTYL ) 20 MG tablet   History of cerebellar stroke   Discussed this with patient.  Chronic appearing on recent head CT 07/2023 however radiology mentions unchanged from prior head CT 2022 - however there's no mention of this then.  She will continue aspirin  81mg  daily, crestor  10mg  daily and tight BP and sugar control.  She notes some dizziness/vertigo - which could be coming from this.  Again recommended she proceed with scheduling brain MRI which was ordered back in 04/2023.         Meds ordered this encounter  Medications   dicyclomine  (BENTYL ) 20 MG tablet    Sig: Take 1 tablet (20 mg total) by mouth 2 (two) times daily as needed for spasms.    Dispense:  30 tablet    Refill:  1    Orders Placed This Encounter  Procedures   POCT glycosylated hemoglobin (Hb A1C)    Patient Instructions  Continue current medicines. You are doing well today Return in 3 months for physical.   The 15-15 rule for low sugars: If sugar reading below 70, have 15 grams of carbohydrate to raise your blood sugar and check it after 15 minutes. If it's still below 70 mg/dL, have another serving. 15 grams of carbs may be: -Glucose tablets (see instructions) -Gel tube (see instructions) -4 ounces (1/2 cup) of juice or regular soda (not diet) -1 tablespoon of sugar, honey, or corn syrup -Hard candies, jellybeans or gumdrops--see food label for how many to consume  Repeat these steps until  your blood sugar is at least 70 mg/dL. Once your blood sugar is back to normal, eat a meal or snack to make sure it doesn't lower again.    Follow up plan: Return in about 3 months (around 01/30/2024) for annual exam, prior fasting for blood work.  Claire Crick, MD

## 2023-10-30 NOTE — Assessment & Plan Note (Addendum)
 Chronic, overall stable with A1c 7.4%.  She is tolerating ozempic  1mg  weekly well - continue this along with her metformin  and insulin  regimen.  She is overdue for retina f/u. She declines diabetes classes.

## 2023-11-03 ENCOUNTER — Other Ambulatory Visit (HOSPITAL_COMMUNITY): Payer: Self-pay

## 2023-11-13 ENCOUNTER — Other Ambulatory Visit (HOSPITAL_COMMUNITY): Payer: Self-pay

## 2023-11-14 ENCOUNTER — Other Ambulatory Visit (HOSPITAL_COMMUNITY): Payer: Self-pay

## 2023-11-21 ENCOUNTER — Other Ambulatory Visit (HOSPITAL_COMMUNITY): Payer: Self-pay

## 2023-11-27 ENCOUNTER — Other Ambulatory Visit: Payer: Self-pay | Admitting: Family Medicine

## 2023-11-27 DIAGNOSIS — B0229 Other postherpetic nervous system involvement: Secondary | ICD-10-CM

## 2023-11-27 NOTE — Telephone Encounter (Signed)
 Gabapentin  Last filled:  10/30/23, #60 Last OV:  10/30/23, DM f/u Next OV:  01/30/24, CPE

## 2023-11-30 ENCOUNTER — Other Ambulatory Visit (HOSPITAL_COMMUNITY): Payer: Self-pay

## 2023-11-30 ENCOUNTER — Other Ambulatory Visit: Payer: Self-pay

## 2023-11-30 MED ORDER — GABAPENTIN 300 MG PO CAPS
300.0000 mg | ORAL_CAPSULE | Freq: Two times a day (BID) | ORAL | 3 refills | Status: DC
  Filled 2023-11-30: qty 60, 30d supply, fill #0
  Filled 2023-12-25: qty 60, 30d supply, fill #1
  Filled 2024-01-22: qty 60, 30d supply, fill #2
  Filled 2024-02-21: qty 60, 30d supply, fill #3

## 2023-12-01 ENCOUNTER — Other Ambulatory Visit (HOSPITAL_COMMUNITY): Payer: Self-pay

## 2023-12-01 ENCOUNTER — Other Ambulatory Visit: Payer: Self-pay

## 2023-12-01 ENCOUNTER — Other Ambulatory Visit: Payer: Self-pay | Admitting: Family Medicine

## 2023-12-01 DIAGNOSIS — Z794 Long term (current) use of insulin: Secondary | ICD-10-CM

## 2023-12-01 MED ORDER — INSULIN PEN NEEDLE 31G X 6 MM MISC
3 refills | Status: AC
  Filled 2023-12-01: qty 100, 100d supply, fill #0
  Filled 2024-03-03: qty 100, 100d supply, fill #1
  Filled 2024-06-11: qty 100, 100d supply, fill #2

## 2023-12-01 NOTE — Telephone Encounter (Signed)
 Tresiba   Last filled 10/30/23, #15 mL  Last OV:  10/30/23, DM f/u Next OV:  01/30/24, CPE  Per current med list, pt's last Novolin  R rx was sent as vials.   Spoke with pt to get clarification of requests. State she prefers Novolin  R KwikPen instead.

## 2023-12-03 MED ORDER — NOVOLIN R FLEXPEN 100 UNIT/ML IJ SOPN
50.0000 [IU] | PEN_INJECTOR | Freq: Three times a day (TID) | INTRAMUSCULAR | 5 refills | Status: DC
  Filled 2023-12-03: qty 45, 30d supply, fill #0
  Filled 2024-01-17: qty 45, 30d supply, fill #1

## 2023-12-03 MED ORDER — TRESIBA FLEXTOUCH 200 UNIT/ML ~~LOC~~ SOPN
88.0000 [IU] | PEN_INJECTOR | Freq: Every day | SUBCUTANEOUS | 5 refills | Status: DC
  Filled 2023-12-03: qty 15, 34d supply, fill #0
  Filled 2024-01-17: qty 15, 34d supply, fill #1
  Filled 2024-02-16: qty 15, 34d supply, fill #2

## 2023-12-03 NOTE — Telephone Encounter (Signed)
 ERx

## 2023-12-04 ENCOUNTER — Other Ambulatory Visit: Payer: Self-pay

## 2023-12-04 ENCOUNTER — Other Ambulatory Visit (HOSPITAL_COMMUNITY): Payer: Self-pay

## 2023-12-12 ENCOUNTER — Ambulatory Visit: Admitting: Podiatry

## 2023-12-28 ENCOUNTER — Ambulatory Visit: Admitting: Podiatry

## 2023-12-29 ENCOUNTER — Telehealth: Payer: Self-pay

## 2023-12-29 NOTE — Telephone Encounter (Signed)
 Left message to return call to our office.

## 2023-12-29 NOTE — Telephone Encounter (Signed)
 Left message to return call to our office to see how she is feeling.

## 2023-12-29 NOTE — Telephone Encounter (Signed)
 Opened in error

## 2023-12-29 NOTE — Telephone Encounter (Signed)
 Copied from CRM #[id number]. Topic: Clinical - Medication Question >> Dec 29, 2023  4:25 PM Ismael A wrote: Reason for CRM: patient called in asking if she ok to take Tresiba  today - requesting reply via mychart

## 2023-12-29 NOTE — Telephone Encounter (Signed)
 SABRA

## 2023-12-29 NOTE — Telephone Encounter (Unsigned)
 Copied from CRM [id number]. Topic: General - Other >> Dec 29, 2023  7:58 AM Donna BRAVO wrote: Reason for CRM: patient returning missed message, patient feels ok blood Glucose  is 158 and is going down. Taken 12/29/23

## 2023-12-29 NOTE — Telephone Encounter (Signed)
 Called patient per Dr KANDICE patient needs to hold today and restart tomorrow at night dose. Advised of message below from pcp as well. She will reach out if any questions.

## 2023-12-29 NOTE — Telephone Encounter (Addendum)
 Please get updated sugar levels.  Do recommend frequently checking sugars with CGM, keeping glucose tablets, juice, snacks on hand throughout this.  Hypoglceymic effect from accidental overdose of basal insulin  can last for 2-3 days.

## 2024-01-06 ENCOUNTER — Other Ambulatory Visit: Payer: Self-pay | Admitting: Family Medicine

## 2024-01-06 DIAGNOSIS — F331 Major depressive disorder, recurrent, moderate: Secondary | ICD-10-CM

## 2024-01-06 DIAGNOSIS — I10 Essential (primary) hypertension: Secondary | ICD-10-CM

## 2024-01-08 ENCOUNTER — Other Ambulatory Visit (HOSPITAL_COMMUNITY): Payer: Self-pay

## 2024-01-08 ENCOUNTER — Other Ambulatory Visit: Payer: Self-pay

## 2024-01-08 MED ORDER — PROPRANOLOL HCL 20 MG PO TABS
20.0000 mg | ORAL_TABLET | Freq: Two times a day (BID) | ORAL | 0 refills | Status: DC
  Filled 2024-01-08: qty 180, 90d supply, fill #0

## 2024-01-08 MED ORDER — VENLAFAXINE HCL ER 75 MG PO CP24
75.0000 mg | ORAL_CAPSULE | Freq: Every day | ORAL | 0 refills | Status: DC
  Filled 2024-01-08: qty 90, 90d supply, fill #0

## 2024-01-08 NOTE — Telephone Encounter (Signed)
 Lvm to schedule prior fasting labs

## 2024-01-08 NOTE — Telephone Encounter (Signed)
 Sent in refill to last until visit.   Please call patient and set up lab appointment.

## 2024-01-09 ENCOUNTER — Encounter: Payer: Self-pay | Admitting: Family Medicine

## 2024-01-11 ENCOUNTER — Ambulatory Visit: Admitting: Podiatry

## 2024-01-11 ENCOUNTER — Telehealth: Payer: Self-pay

## 2024-01-11 NOTE — Telephone Encounter (Signed)
 Will await forms.

## 2024-01-11 NOTE — Telephone Encounter (Signed)
 Copied from CRM [id number]. Topic: General - Other >> Jan 11, 2024 11:20 AM Rea ORN wrote: Reason for CRM: Tyson, resident coordinator for pt's apartment [phone]), called to alert clinic that they will be faxing over a request for a ramp outside of the pt's apartment and for grab bars in the bathroom.

## 2024-01-17 ENCOUNTER — Other Ambulatory Visit (HOSPITAL_COMMUNITY): Payer: Self-pay

## 2024-01-17 ENCOUNTER — Other Ambulatory Visit: Payer: Self-pay | Admitting: Family Medicine

## 2024-01-17 ENCOUNTER — Other Ambulatory Visit: Payer: Self-pay

## 2024-01-17 DIAGNOSIS — E113513 Type 2 diabetes mellitus with proliferative diabetic retinopathy with macular edema, bilateral: Secondary | ICD-10-CM

## 2024-01-18 ENCOUNTER — Other Ambulatory Visit: Payer: Self-pay

## 2024-01-18 ENCOUNTER — Other Ambulatory Visit

## 2024-01-27 ENCOUNTER — Other Ambulatory Visit: Payer: Self-pay | Admitting: Family Medicine

## 2024-01-27 DIAGNOSIS — E1169 Type 2 diabetes mellitus with other specified complication: Secondary | ICD-10-CM

## 2024-01-29 ENCOUNTER — Other Ambulatory Visit (HOSPITAL_COMMUNITY): Payer: Self-pay

## 2024-01-29 MED ORDER — ROSUVASTATIN CALCIUM 10 MG PO TABS
10.0000 mg | ORAL_TABLET | Freq: Every day | ORAL | 3 refills | Status: AC
  Filled 2024-01-29: qty 90, 90d supply, fill #0
  Filled 2024-02-16 – 2024-04-23 (×2): qty 90, 90d supply, fill #1

## 2024-01-30 ENCOUNTER — Encounter: Admitting: Family Medicine

## 2024-01-30 NOTE — Progress Notes (Deleted)
 Ph: [phone] Fax: [phone]   Patient ID: Karen [name], female    DOB: 07/13/1955, 68 y.o.   MRN: [id number]  This visit was conducted in person.  There were no vitals taken for this visit.   CC: CPE Subjective:   HPI: Karen [name] is a 68 y.o. female presenting on 01/30/2024 for No chief complaint on file.   Saw health advisor 10/2023 for medicare wellness visit. Note reviewed.   No results found.  Flowsheet Row Office Visit from 10/30/2023 in [facility] [place]  PHQ-2 Total Score 0       10/30/2023   12:24 PM 10/22/2023    8:54 AM 09/04/2023   11:52 AM 06/21/2023   12:17 PM 04/18/2023    2:04 PM  Fall Risk   Falls in the past year? 1 1 1  0 1  Number falls in past yr: 1 1   1   Injury with Fall? 1 1 1  1   Risk for fall due to :  Impaired balance/gait History of fall(s)    Risk for fall due to: Comment  pt says she has balance issues and dizziness with vertigo     Follow up  Education provided;Falls prevention discussed Falls evaluation completed    Amnestic period for 4 days 04/2023 - brain MRI ordered, she never completed this. She did have subsequent head CT 07/2023 after ER visit for fall with head injury - incidentally noted chronic appearing R cerebellar infarct.   DM - continues ozempic  1mg  weekly, metformin  XR 750mg  bid, tresiba  88u at bedtime, and sliding scale Novolin  R 24u TID + sliding scale - she notes she's not regularly using Novolin  R. Regularly sees podiatrist for foot care. Overdue for eye exam - established with retinologist.    CGM data: Freestyle Libre 3 plus 30 day average sugar: ***, time in range ***%, hypoglycemia ***%, stage 1 hyperglycemia ***%, stage 2 hyperglycemia ***%.    Known cirrhosis by imaging. Last RUQ US  11/2021 with gallstones noted.  Denies claudication symptoms.   She is on effexor  in place of cymbalta  due to liver disease.    Preventative: COLONOSCOPY WITH ESOPHAGOGASTRODUODENOSCOPY 04/2019 -  esophageal varices II, portal hypertensive gastritis, gastric polyp, H pylori neg, 12 TAs, rpt colonoscopy in 1 yr ([facility]) - has not rescheduled.  Well woman exam 01/2019 - h/o abnormal but not recently, neg HPV, told rpt 3 yrs  - DUE for this. Requests we defer to next time.  [facility] - still DUE for this.  Lung cancer screening - not eligible  Flu shot - yearly COVID vaccine Pfizer 07/2019, 08/2019, booster 04/2020 Pneumovax-23 - 2015, prevnar-20 10/2020 Tdap - 2017 Shingrix - discussed  Advanced directive discussed: does not have. She's unsure who she would designate as HCPOA - possibly brother Lynwood Portugal. Packet previously provided.  Seat belt use discussed Sunscreen use discussed. No changing moles on skin.  Smoking - never Alcohol - none Dentist - due for dental work - trouble finding dentist  Eye exam - yearly [place] - also sees Community education officer s/p lens to right eye Bowel - no constipation  Bladder - mild stress incontinence    Caffeine: 2-3 diet sodas/day Lives alone  Occupation: was CNA - now on disability Activity: walking some Diet: some water, fruits/vegetables daily      Relevant past medical, surgical, family and social history reviewed and updated as indicated. Interim medical history since our last visit  reviewed. Allergies and medications reviewed and updated. Outpatient Medications Prior to Visit  Medication Sig Dispense Refill   acetaminophen  (TYLENOL ) 500 MG tablet Take 1 tablet (500 mg total) by mouth 2 (two) times daily as needed for moderate pain (pain score 4-6).     aspirin  EC 81 MG EC tablet Take 1 tablet (81 mg total) by mouth daily.     Continuous Glucose Sensor (FREESTYLE LIBRE 3 PLUS SENSOR) MISC Change sensor every 15 days. 2 each 6   Continuous Glucose Sensor (FREESTYLE LIBRE 3 PLUS SENSOR) MISC Place 1 each onto the skin every 15 days as directed 2 each 6   dicyclomine  (BENTYL ) 20 MG tablet Take 1 tablet (20  mg total) by mouth 2 (two) times daily as needed for spasms. 30 tablet 1   furosemide  (LASIX ) 20 MG tablet Take 1 tablet (20 mg total) by mouth daily as needed for edema. 30 tablet 3   gabapentin  (NEURONTIN ) 300 MG capsule Take 1 capsule (300 mg total) by mouth 2 (two) times daily. 60 capsule 3   Glucagon  (GVOKE HYPOPEN  2-PACK) 1 MG/0.2ML SOAJ Inject 1 mg into the skin daily as needed (hypoglycemia). 0.4 mL 1   insulin  degludec (TRESIBA  FLEXTOUCH) 200 UNIT/ML FlexTouch Pen Inject 88 Units into the skin daily. 15 mL 5   Insulin  Pen Needle 31G X 6 MM MISC Use to inject insulin  daily 100 each 3   insulin  regular (NOVOLIN  R) 100 units/mL injection INJECT 25 UNITS INTO THE SKIN THREE TIMES DAILY BEFORE MEALS PLUS SLIDING SCALE FOR EVERY 50 OVER 200 10 mL 0   Insulin  Regular Human (NOVOLIN  R FLEXPEN RELION) 100 UNIT/ML KwikPen Inject 50 Units as directed 3 (three) times daily before meals. + SSI 5 units for every 50 units over 250 45 mL 5   losartan  (COZAAR ) 25 MG tablet Take 1 tablet (25 mg total) by mouth daily. 90 tablet 4   metFORMIN  (GLUCOPHAGE -XR) 750 MG 24 hr tablet Take 1 tablet (750 mg total) by mouth in the morning and at bedtime. 180 tablet 4   mupirocin  ointment (BACTROBAN ) 2 % Apply 1 application topically 2 (two) times daily. 22 g 0   nitroGLYCERIN  (NITROSTAT ) 0.4 MG SL tablet Place 1 tablet (0.4 mg total) under the tongue every 5 (five) minutes as needed for chest pain. 25 tablet 0   omeprazole  (PRILOSEC) 40 MG capsule Take 1 capsule (40 mg total) by mouth 2 (two) times daily. 180 capsule 4   polyethylene glycol (MIRALAX / GLYCOLAX) 17 g packet Take 17 g by mouth daily as needed for mild constipation.     potassium chloride  (MICRO-K ) 10 MEQ CR capsule Take 1 capsule (10 mEq total) by mouth daily. 30 capsule 3   promethazine  (PHENERGAN ) 25 MG tablet Take 0.5-1 tablets (12.5-25 mg total) by mouth 2 (two) times daily as needed for nausea. 20 tablet 0   propranolol  (INDERAL ) 20 MG tablet Take 1  tablet (20 mg total) by mouth 2 (two) times daily. 180 tablet 0   rosuvastatin  (CRESTOR ) 10 MG tablet Take 1 tablet (10 mg total) by mouth daily. 90 tablet 3   Semaglutide , 1 MG/DOSE, 4 MG/3ML SOPN Inject 1 mg as directed once a week. 3 mL 3   spironolactone  (ALDACTONE ) 25 MG tablet Take 0.5 tablets (12.5 mg total) by mouth daily. 45 tablet 4   traMADol  (ULTRAM ) 50 MG tablet Take 1 tablet (50 mg total) by mouth every 8 (eight) hours as needed for moderate pain (pain score 4-6). 20  tablet 0   venlafaxine  XR (EFFEXOR  XR) 75 MG 24 hr capsule Take 1 capsule (75 mg total) by mouth daily with breakfast. 90 capsule 0   No facility-administered medications prior to visit.     Per HPI unless specifically indicated in ROS section below Review of Systems  Constitutional:  Negative for activity change, appetite change, chills, fatigue, fever and unexpected weight change.  HENT:  Negative for hearing loss.   Eyes:  Negative for visual disturbance.  Respiratory:  Negative for cough, chest tightness, shortness of breath and wheezing.   Cardiovascular:  Negative for chest pain, palpitations and leg swelling.  Gastrointestinal:  Negative for abdominal distention, abdominal pain, blood in stool, constipation, diarrhea, nausea and vomiting.  Genitourinary:  Negative for difficulty urinating and hematuria.  Musculoskeletal:  Negative for arthralgias, myalgias and neck pain.  Skin:  Negative for rash.  Neurological:  Negative for dizziness, seizures, syncope and headaches.  Hematological:  Negative for adenopathy. Does not bruise/bleed easily.  Psychiatric/Behavioral:  Negative for dysphoric mood. The patient is not nervous/anxious.     Objective:  There were no vitals taken for this visit.  Wt Readings from Last 3 Encounters:  10/30/23 182 lb 4 oz (82.7 kg)  10/26/23 176 lb (79.8 kg)  09/04/23 176 lb 2 oz (79.9 kg)      Physical Exam Vitals and nursing note reviewed.  Constitutional:       Appearance: Normal appearance. She is not ill-appearing.  HENT:     Head: Normocephalic and atraumatic.     Right Ear: Tympanic membrane, ear canal and external ear normal. There is no impacted cerumen.     Left Ear: Tympanic membrane, ear canal and external ear normal. There is no impacted cerumen.     Mouth/Throat:     Mouth: Mucous membranes are moist.     Pharynx: Oropharynx is clear. No oropharyngeal exudate or posterior oropharyngeal erythema.  Eyes:     General:        Right eye: No discharge.        Left eye: No discharge.     Extraocular Movements: Extraocular movements intact.     Conjunctiva/sclera: Conjunctivae normal.     Pupils: Pupils are equal, round, and reactive to light.  Neck:     Thyroid : No thyroid  mass or thyromegaly.     Vascular: No carotid bruit.  Cardiovascular:     Rate and Rhythm: Normal rate and regular rhythm.     Pulses: Normal pulses.     Heart sounds: Normal heart sounds. No murmur heard. Pulmonary:     Effort: Pulmonary effort is normal. No respiratory distress.     Breath sounds: Normal breath sounds. No wheezing, rhonchi or rales.  Abdominal:     General: Bowel sounds are normal. There is no distension.     Palpations: Abdomen is soft. There is no mass.     Tenderness: There is no abdominal tenderness. There is no guarding or rebound.     Hernia: No hernia is present.  Musculoskeletal:     Cervical back: Normal range of motion and neck supple. No rigidity.     Right lower leg: No edema.     Left lower leg: No edema.  Lymphadenopathy:     Cervical: No cervical adenopathy.  Skin:    General: Skin is warm and dry.     Findings: No rash.  Neurological:     General: No focal deficit present.     Mental Status: She is  alert. Mental status is at baseline.  Psychiatric:        Mood and Affect: Mood normal.        Behavior: Behavior normal.       Results for orders placed or performed in visit on 10/30/23  POCT glycosylated hemoglobin (Hb  A1C)   Collection Time: 10/30/23 12:26 PM  Result Value Ref Range   Hemoglobin A1C 7.4 (A) 4.0 - 5.6 %   HbA1c POC (<> result, manual entry)     HbA1c, POC (prediabetic range)     HbA1c, POC (controlled diabetic range)     *Note: Due to a large number of results and/or encounters for the requested time period, some results have not been displayed. A complete set of results can be found in Results Review.    Assessment & Plan:   Problem List Items Addressed This Visit   None    No orders of the defined types were placed in this encounter.   No orders of the defined types were placed in this encounter.   There are no Patient Instructions on file for this visit.  Follow up plan: No follow-ups on file.  Anton Blas, MD

## 2024-02-07 ENCOUNTER — Telehealth: Payer: Self-pay | Admitting: Family Medicine

## 2024-02-07 NOTE — Telephone Encounter (Signed)
 Pt came in stating that she is in need refill for a Reader for libre 3 plus. Pt says that her phone does not support the reader. Please reach out to pt.

## 2024-02-08 ENCOUNTER — Other Ambulatory Visit (HOSPITAL_COMMUNITY): Payer: Self-pay

## 2024-02-08 ENCOUNTER — Ambulatory Visit: Admitting: Podiatry

## 2024-02-08 ENCOUNTER — Other Ambulatory Visit: Payer: Self-pay

## 2024-02-08 MED ORDER — FREESTYLE LIBRE 3 READER DEVI
1.0000 | Freq: Once | 0 refills | Status: AC
  Filled 2024-02-08: qty 1, 90d supply, fill #0

## 2024-02-08 NOTE — Addendum Note (Signed)
 Addended by: SEBASTIAN DANNA GRADE on: 02/08/2024 03:26 PM   Modules accepted: Orders

## 2024-02-08 NOTE — Telephone Encounter (Signed)
 Order placed for reader.  No further action needed at this time.

## 2024-02-13 ENCOUNTER — Encounter: Payer: Self-pay | Admitting: Podiatry

## 2024-02-13 ENCOUNTER — Ambulatory Visit (INDEPENDENT_AMBULATORY_CARE_PROVIDER_SITE_OTHER): Admitting: Podiatry

## 2024-02-13 DIAGNOSIS — B351 Tinea unguium: Secondary | ICD-10-CM

## 2024-02-13 DIAGNOSIS — D2372 Other benign neoplasm of skin of left lower limb, including hip: Secondary | ICD-10-CM

## 2024-02-13 DIAGNOSIS — E1142 Type 2 diabetes mellitus with diabetic polyneuropathy: Secondary | ICD-10-CM | POA: Diagnosis not present

## 2024-02-13 DIAGNOSIS — D2371 Other benign neoplasm of skin of right lower limb, including hip: Secondary | ICD-10-CM | POA: Diagnosis not present

## 2024-02-13 DIAGNOSIS — M79676 Pain in unspecified toe(s): Secondary | ICD-10-CM

## 2024-02-13 NOTE — Progress Notes (Signed)
 She presents today chief complaint of painful elongated toenails and calluses.  Objective: Toenails are long thick yellow dystrophic with mycotic multiple benign skin lesions bilateral forefoot.  Pulses are palpable.  No open lesions or wounds.  Assessment: Pain in limb secondary onychomycosis benign skin lesions.  Plan: Debridement of benign skin lesions debridement of toenails 1 through 5 bilateral.

## 2024-02-16 ENCOUNTER — Other Ambulatory Visit (HOSPITAL_COMMUNITY): Payer: Self-pay

## 2024-02-16 ENCOUNTER — Other Ambulatory Visit: Payer: Self-pay | Admitting: Family Medicine

## 2024-02-16 DIAGNOSIS — I10 Essential (primary) hypertension: Secondary | ICD-10-CM

## 2024-02-16 MED ORDER — PROPRANOLOL HCL 20 MG PO TABS
20.0000 mg | ORAL_TABLET | Freq: Two times a day (BID) | ORAL | 0 refills | Status: DC
  Filled 2024-02-16: qty 180, 90d supply, fill #0

## 2024-02-19 NOTE — Progress Notes (Signed)
 NEOLA WORRALL                                          MRN: [id number]   02/19/2024   The [facility] Quality Team Specialist reviewed this patient medical record for the purposes of chart review for care gap closure. The following were reviewed: chart review for care gap closure-kidney health evaluation for diabetes:eGFR  and uACR.    [facility] Quality Team

## 2024-02-28 ENCOUNTER — Emergency Department (HOSPITAL_COMMUNITY)

## 2024-02-28 ENCOUNTER — Emergency Department (HOSPITAL_COMMUNITY): Admission: EM | Admit: 2024-02-28 | Discharge: 2024-02-28 | Disposition: A | Discharge status: Home / Self Care

## 2024-02-28 DIAGNOSIS — M47812 Spondylosis without myelopathy or radiculopathy, cervical region: Secondary | ICD-10-CM | POA: Diagnosis not present

## 2024-02-28 DIAGNOSIS — S0990XA Unspecified injury of head, initial encounter: Secondary | ICD-10-CM | POA: Diagnosis not present

## 2024-02-28 DIAGNOSIS — Z79899 Other long term (current) drug therapy: Secondary | ICD-10-CM | POA: Diagnosis not present

## 2024-02-28 DIAGNOSIS — G930 Cerebral cysts: Secondary | ICD-10-CM | POA: Diagnosis not present

## 2024-02-28 DIAGNOSIS — I2510 Atherosclerotic heart disease of native coronary artery without angina pectoris: Secondary | ICD-10-CM | POA: Diagnosis not present

## 2024-02-28 DIAGNOSIS — W01198A Fall on same level from slipping, tripping and stumbling with subsequent striking against other object, initial encounter: Secondary | ICD-10-CM | POA: Insufficient documentation

## 2024-02-28 DIAGNOSIS — S0081XA Abrasion of other part of head, initial encounter: Secondary | ICD-10-CM | POA: Diagnosis not present

## 2024-02-28 DIAGNOSIS — Y9301 Activity, walking, marching and hiking: Secondary | ICD-10-CM | POA: Insufficient documentation

## 2024-02-28 DIAGNOSIS — S060X0A Concussion without loss of consciousness, initial encounter: Secondary | ICD-10-CM

## 2024-02-28 DIAGNOSIS — S3993XA Unspecified injury of pelvis, initial encounter: Secondary | ICD-10-CM | POA: Diagnosis not present

## 2024-02-28 DIAGNOSIS — S299XXA Unspecified injury of thorax, initial encounter: Secondary | ICD-10-CM | POA: Diagnosis not present

## 2024-02-28 DIAGNOSIS — M542 Cervicalgia: Secondary | ICD-10-CM | POA: Diagnosis not present

## 2024-02-28 DIAGNOSIS — Z794 Long term (current) use of insulin: Secondary | ICD-10-CM | POA: Diagnosis not present

## 2024-02-28 DIAGNOSIS — R58 Hemorrhage, not elsewhere classified: Secondary | ICD-10-CM | POA: Diagnosis not present

## 2024-02-28 DIAGNOSIS — R202 Paresthesia of skin: Secondary | ICD-10-CM | POA: Diagnosis not present

## 2024-02-28 DIAGNOSIS — Z7984 Long term (current) use of oral hypoglycemic drugs: Secondary | ICD-10-CM | POA: Insufficient documentation

## 2024-02-28 DIAGNOSIS — I10 Essential (primary) hypertension: Secondary | ICD-10-CM | POA: Diagnosis not present

## 2024-02-28 DIAGNOSIS — W19XXXA Unspecified fall, initial encounter: Secondary | ICD-10-CM | POA: Diagnosis not present

## 2024-02-28 DIAGNOSIS — E119 Type 2 diabetes mellitus without complications: Secondary | ICD-10-CM | POA: Diagnosis not present

## 2024-02-28 DIAGNOSIS — Z7982 Long term (current) use of aspirin: Secondary | ICD-10-CM | POA: Insufficient documentation

## 2024-02-28 DIAGNOSIS — R42 Dizziness and giddiness: Secondary | ICD-10-CM | POA: Diagnosis not present

## 2024-02-28 DIAGNOSIS — I700 Atherosclerosis of aorta: Secondary | ICD-10-CM | POA: Diagnosis not present

## 2024-02-28 DIAGNOSIS — R0989 Other specified symptoms and signs involving the circulatory and respiratory systems: Secondary | ICD-10-CM | POA: Diagnosis not present

## 2024-02-28 DIAGNOSIS — S199XXA Unspecified injury of neck, initial encounter: Secondary | ICD-10-CM | POA: Diagnosis not present

## 2024-02-28 LAB — CBC
HCT: 35.5 % — ABNORMAL LOW (ref 36.0–46.0)
Hemoglobin: 11.5 g/dL — ABNORMAL LOW (ref 12.0–15.0)
MCH: 29.1 pg (ref 26.0–34.0)
MCHC: 32.4 g/dL (ref 30.0–36.0)
MCV: 89.9 fL (ref 80.0–100.0)
Platelets: 71 K/uL — ABNORMAL LOW (ref 150–400)
RBC: 3.95 MIL/uL (ref 3.87–5.11)
RDW: 14.2 % (ref 11.5–15.5)
WBC: 3.6 K/uL — ABNORMAL LOW (ref 4.0–10.5)
nRBC: 0 % (ref 0.0–0.2)

## 2024-02-28 LAB — BASIC METABOLIC PANEL WITH GFR
Anion gap: 9 (ref 5–15)
BUN: 9 mg/dL (ref 8–23)
CO2: 23 mmol/L (ref 22–32)
Calcium: 8.7 mg/dL — ABNORMAL LOW (ref 8.9–10.3)
Chloride: 105 mmol/L (ref 98–111)
Creatinine, Ser: 0.78 mg/dL (ref 0.44–1.00)
GFR, Estimated: >60 mL/min (ref >60)
Glucose, Bld: 155 mg/dL — ABNORMAL HIGH (ref 70–99)
Potassium: 4.1 mmol/L (ref 3.5–5.1)
Sodium: 137 mmol/L (ref 135–145)

## 2024-02-28 LAB — CBG MONITORING, ED: Glucose-Capillary: 147 mg/dL — ABNORMAL HIGH (ref 70–99)

## 2024-02-28 NOTE — Progress Notes (Signed)
   02/28/24 1705  Spiritual Encounters  Type of Visit Initial  Care provided to: Patient  Referral source Trauma page  Reason for visit Trauma  OnCall Visit Yes   Responded to level 2 trauma. Provided care thru companionate presence and reflective listening. Patient verbal and alert. Says she keeps losing her balance more and more. Also says she has vertigo issues she has discussed with her doctor. Advised patient is she is in further need of spiritual care have nurse reach out to chaplain office.

## 2024-02-28 NOTE — ED Provider Notes (Signed)
 [place] EMERGENC[facility] DEPARTMENT AT [facility] Provider Note   CSN: [id number] Arrival date & time: 02/28/24  1649     Patient presents with: [place]   Karen [name] is a 68 y.o. female.   68 year old female presenting emergency department by EMS after falling.  Presented as a trauma activation as she is having some neck pain and had some transient heaviness/tingling sensation to her right forearm and hand.  That has resolved.  She was walking, tripped and fell striking her forehead.  No LOC.  Not on blood thinners.  Reports that immediately after the fall she had some tingly sensation to her forearm and hand.  Denies chest pain shortness of breath pelvic pain or pain to her other extremities.  She does complain of some neck pain slightly worse on her left lower side   Fall       Prior to Admission medications   Medication Sig Start Date End Date Taking? Authorizing Provider  acetaminophen  (TYLENOL ) 500 MG tablet Take 1 tablet (500 mg total) by mouth 2 (two) times daily as needed for moderate pain (pain score 4-6). 03/10/23   Rilla Baller, MD  aspirin  EC 81 MG EC tablet Take 1 tablet (81 mg total) by mouth daily. 07/25/13   Shlomo Wilbert SAUNDERS, MD  Continuous Glucose Sensor (FREESTYLE LIBRE 3 PLUS SENSOR) MISC Change sensor every 15 days. 03/31/23   Rilla Baller, MD  Continuous Glucose Sensor (FREESTYLE LIBRE 3 PLUS SENSOR) MISC Place 1 each onto the skin every 15 days as directed 10/27/23   Rilla Baller, MD  dicyclomine  (BENTYL ) 20 MG tablet Take 1 tablet (20 mg total) by mouth 2 (two) times daily as needed for spasms. 10/30/23   Rilla Baller, MD  furosemide  (LASIX ) 20 MG tablet Take 1 tablet (20 mg total) by mouth daily as needed for edema. 09/18/23   Rilla Baller, MD  gabapentin  (NEURONTIN ) 300 MG capsule Take 1 capsule (300 mg total) by mouth 2 (two) times daily. 11/30/23   Rilla Baller, MD  Glucagon  (GVOKE HYPOPEN  2-PACK) 1 MG/0.2ML SOAJ Inject 1 mg  into the skin daily as needed (hypoglycemia). 06/21/23   Rilla Baller, MD  insulin  degludec (TRESIBA  FLEXTOUCH) 200 UNIT/ML FlexTouch Pen Inject 88 Units into the skin daily. 12/03/23   Rilla Baller, MD  Insulin  Pen Needle 31G X 6 MM MISC Use to inject insulin  daily 12/01/23   Rilla Baller, MD  insulin  regular (NOVOLIN  R) 100 units/mL injection INJECT 25 UNITS INTO THE SKIN THREE TIMES DAILY BEFORE MEALS PLUS SLIDING SCALE FOR EVERY 50 OVER 200 09/25/23   Cleatus Arlyss RAMAN, MD  Insulin  Regular Human (NOVOLIN  R FLEXPEN RELION) 100 UNIT/ML KwikPen Inject 50 Units as directed 3 (three) times daily before meals. + SSI 5 units for every 50 units over 250 12/03/23   Rilla Baller, MD  losartan  (COZAAR ) 25 MG tablet Take 1 tablet (25 mg total) by mouth daily. 12/16/22   Rilla Baller, MD  metFORMIN  (GLUCOPHAGE -XR) 750 MG 24 hr tablet Take 1 tablet (750 mg total) by mouth in the morning and at bedtime. 12/16/22   Rilla Baller, MD  mupirocin  ointment (BACTROBAN ) 2 % Apply 1 application topically 2 (two) times daily. 12/28/20   Rilla Baller, MD  nitroGLYCERIN  (NITROSTAT ) 0.4 MG SL tablet Place 1 tablet (0.4 mg total) under the tongue every 5 (five) minutes as needed for chest pain. 08/03/20   Rilla Baller, MD  omeprazole  (PRILOSEC) 40 MG capsule Take 1 capsule (40 mg total) by  mouth 2 (two) times daily. 12/16/22   Rilla Baller, MD  polyethylene glycol (MIRALAX / GLYCOLAX) 17 g packet Take 17 g by mouth daily as needed for mild constipation.    [provider]  potassium chloride  (MICRO-K ) 10 MEQ CR capsule Take 1 capsule (10 mEq total) by mouth daily. 07/10/23   Copland, Jacques, MD  promethazine  (PHENERGAN ) 25 MG tablet Take 0.5-1 tablets (12.5-25 mg total) by mouth 2 (two) times daily as needed for nausea. 09/23/22   Rilla Baller, MD  propranolol  (INDERAL ) 20 MG tablet Take 1 tablet (20 mg total) by mouth 2 (two) times daily. 02/16/24   Rilla Baller, MD  rosuvastatin   (CRESTOR ) 10 MG tablet Take 1 tablet (10 mg total) by mouth daily. 01/29/24   Rilla Baller, MD  Semaglutide , 1 MG/DOSE, 4 MG/3ML SOPN Inject 1 mg as directed once a week. 06/21/23   Rilla Baller, MD  spironolactone  (ALDACTONE ) 25 MG tablet Take 0.5 tablets (12.5 mg total) by mouth daily. 12/16/22   Rilla Baller, MD  traMADol  (ULTRAM ) 50 MG tablet Take 1 tablet (50 mg total) by mouth every 8 (eight) hours as needed for moderate pain (pain score 4-6). 09/25/23   Copland, Jacques, MD  venlafaxine  XR (EFFEXOR  XR) 75 MG 24 hr capsule Take 1 capsule (75 mg total) by mouth daily with breakfast. 01/08/24   Rilla Baller, MD    Allergies: Clindamycin/lincomycin, Erythromycin, Lipitor  [atorvastatin ], Ace inhibitors, Simvastatin , and Penicillins    Review of Systems  Updated Vital Signs BP (!) 119/49   Pulse 71   Temp 98.1 F (36.7 C) (Oral)   Resp 14   SpO2 100%   Physical Exam Vitals and nursing note reviewed.  HENT:     Head: Normocephalic.     Comments: Superficial abrasion to her left forehead.  No laceration    Nose: Nose normal.     Mouth/Throat:     Mouth: Mucous membranes are moist.  Eyes:     Extraocular Movements: Extraocular movements intact.     Conjunctiva/sclera: Conjunctivae normal.     Pupils: Pupils are equal, round, and reactive to light.  Neck:     Comments: No midline spinal tenderness.  Does have some tenderness to the bilateral paracervical musculature of the lower C-spine Cardiovascular:     Rate and Rhythm: Normal rate and regular rhythm.  Pulmonary:     Effort: Pulmonary effort is normal.     Breath sounds: Normal breath sounds.  Abdominal:     General: Abdomen is flat. There is no distension.     Palpations: Abdomen is soft.     Tenderness: There is no abdominal tenderness. There is no guarding or rebound.  Musculoskeletal:        General: Normal range of motion.     Cervical back: Neck supple.     Comments: Chest wall stable nontender.  Pelvis  stable nontender.  No bony tenderness to her extremities.  5-5 bicep strength tricep strength.  Plantarflexion and dorsiflexion.  Equal pulses in all extremities.  Skin:    General: Skin is warm and dry.  Neurological:     General: No focal deficit present.     Mental Status: She is alert and oriented to person, place, and time.  Psychiatric:        Mood and Affect: Mood normal.        Behavior: Behavior normal.     (all labs ordered are listed, but only abnormal results are displayed) Labs Reviewed  CBC -  Abnormal; Notable for the following components:      Result Value   WBC 3.6 (*)    Hemoglobin 11.5 (*)    HCT 35.5 (*)    Platelets 71 (*)    All other components within normal limits  BASIC METABOLIC PANEL WITH GFR - Abnormal; Notable for the following components:   Glucose, Bld 155 (*)    Calcium  8.7 (*)    All other components within normal limits  CBG MONITORING, ED - Abnormal; Notable for the following components:   Glucose-Capillary 147 (*)    All other components within normal limits    EKG: None  Radiology: DG Pelvis 1-2 Views Result Date: 02/28/2024 CLINICAL DATA:  trauma EXAM: PELVIS - 1-2 VIEW COMPARISON:  07/10/2023 FINDINGS: No evidence of pelvic fracture or diastasis.No acute hip fracture or dislocation.There is no evidence of arthropathy or other focal bone abnormality. Peripheral vascular atherosclerosis. IMPRESSION: No acute fracture, pelvic bone diastasis, or dislocation. Electronically Signed   By: Rogelia Myers M.D.   On: 02/28/2024 17:31   CT Head Wo Contrast Result Date: 02/28/2024 EXAM: CT HEAD WITHOUT CONTRAST 02/28/2024 05:08:44 PM TECHNIQUE: CT of the head was performed without the administration of intravenous contrast. Automated exposure control, iterative reconstruction, and/or weight based adjustment of the mA/kV was utilized to reduce the radiation dose to as low as reasonably achievable. COMPARISON: CT head 08/03/2023. CLINICAL HISTORY: Head  trauma, minor (Age >= 65y). Fall?? FINDINGS: BRAIN AND VENTRICLES: No acute hemorrhage. No evidence of acute infarct. No hydrocephalus. No extra-axial collection. No mass effect or midline shift. Unchanged left middle cranial fossa arachnoid cyst. ORBITS: No acute abnormality. SINUSES: Chronic sphenoid sinusitis. SOFT TISSUES AND SKULL: No acute soft tissue abnormality. No skull fracture. IMPRESSION: 1. No acute intracranial abnormality. Electronically signed by: Norman Gatlin MD 02/28/2024 05:30 PM EDT RP Workstation: [id number]   DG Chest Portable 1 View Result Date: 02/28/2024 CLINICAL DATA:  trauma EXAM: PORTABLE CHEST - 1 VIEW COMPARISON:  09/26/2021 FINDINGS: Lower lung volumes. No focal airspace consolidation, pleural effusion, or pneumothorax. No cardiomegaly. Aortic atherosclerosis. No acute fracture or destructive lesions. Multilevel thoracic osteophytosis. IMPRESSION: No acute cardiopulmonary abnormality. Electronically Signed   By: Rogelia Myers M.D.   On: 02/28/2024 17:30   CT Cervical Spine Wo Contrast Result Date: 02/28/2024 EXAM: CT CERVICAL SPINE WITHOUT CONTRAST 02/28/2024 05:08:44 PM TECHNIQUE: CT of the cervical spine was performed without the administration of intravenous contrast. Multiplanar reformatted images are provided for review. Automated exposure control, iterative reconstruction, and/or weight based adjustment of the mA/kV was utilized to reduce the radiation dose to as low as reasonably achievable. COMPARISON: CT dated 08/03/2023. CLINICAL HISTORY: Neck trauma (Age >= 65y). Fall?? FINDINGS: CERVICAL SPINE: BONES AND ALIGNMENT: No acute fracture or traumatic malalignment. DEGENERATIVE CHANGES: Moderate disc space height loss and degenerative endplate changes at C5-C6 and C6-C7. No severe spinal canal narrowing. SOFT TISSUES: No prevertebral soft tissue swelling. IMPRESSION: 1. No acute abnormality of the cervical spine related to the reported neck trauma. Electronically  signed by: Norman Gatlin MD 02/28/2024 05:28 PM EDT RP Workstation: [id number]     Procedures   Medications Ordered in the ED - No data to display                                  Medical Decision Making This is a 68 year old presenting emergency department after a fall.  Complicated past medical history to include diabetes,  obesity, hypertension, CAD, seizure, cirrhosis.  CT head and C-spine ordered given her fall with head strike and she was complaining of some neck pain.  They were negative.  Likely with minor concussion.  Her transient tingling I suspect is more of a peripheral nerve as she does have some minor superficial abrasions to her right elbow.  No bony tenderness to indicate fracture.  Chest x-ray pelvis x-ray negative.  No metabolic derangements.  Minor anemia, but close to baseline.  She was able to ambulate in the emergency department.  She is asymptomatic currently and without complaints.  Will discharge in stable condition.  Amount and/or Complexity of Data Reviewed Labs: ordered. Radiology: ordered. ECG/medicine tests: ordered.       Final diagnoses:  None    ED Discharge Orders     None          Neysa Caron PARAS, DO 02/28/24 1901

## 2024-02-28 NOTE — ED Notes (Signed)
Pt ambulated with no problems  

## 2024-02-28 NOTE — Discharge Instructions (Addendum)
 May take over-the-counter medications for pain.  Please return if develop severe headache, vision loss, facial droop, unilateral weakness, numbness tingling changes in sensation, severe chest pain, difficulty breathing or any new or worsening symptoms that are concerning to you.

## 2024-03-01 ENCOUNTER — Ambulatory Visit: Payer: Self-pay

## 2024-03-01 ENCOUNTER — Encounter: Admitting: Family Medicine

## 2024-03-01 NOTE — Telephone Encounter (Signed)
 Called [facility], spoke with Amy, pt requests to cancel physical. Pt needs ED f/u appt and physical.

## 2024-03-01 NOTE — Telephone Encounter (Signed)
 FYI Only or Action Required?: Action required by provider: request for appointment and clinical question for provider.  Patient was last seen in primary care on 10/30/2023 by Rilla Baller, MD.  Called Nurse Triage reporting Fall.  Symptoms began several days ago.  Interventions attempted: OTC medications: tylenol .  Symptoms are: unchanged.  Triage Disposition: See PCP When Office is Open (Within 3 Days)  Patient/caregiver understands and will follow disposition?: No, wishes to speak with PCP   Copied from CRM #[id number]. Topic: Clinical - Red Word Triage >> Mar 01, 2024 12:35 PM Robinson H wrote: [facility] that prompted transfer to Nurse Triage: patient fell day before yesterday and calling to cancel appt for today because she's still sore and feeling it from the fall, she went to ER and they didnt' find anything Reason for Disposition  MILD weakness (e.g., does not interfere with ability to work, go to school, normal activities)  (Exception: Mild weakness is a chronic symptom.)    Bilateral knee pain, denies weakness  Answer Assessment - Initial Assessment Questions No appts available today. Patient declined appt today for knee pain and reports was calling to cancel physical appt today.  Unable to reschedule physical and or make f/u ED appt, pt reports have to get off phone and call transportation to cancel ride today.  Advised UC/ED if symptoms worsen.  1. MECHANISM: How did the fall happen?     Hit left eye, skinned both knees Seen in ED for fall on Wednesday. 3. ONSET: When did the fall happen? (e.g., minutes, hours, or days ago)     Wednesday 4. LOCATION: What part of the body hit the ground? (e.g., back, buttocks, head, hips, knees, hands, head, stomach)     Left eye, knees bilateral  6. PAIN: Is there any pain? If Yes, ask: How bad is the pain? (e.g., Scale 0-10; or none, mild,      At times it's sever pain in knees, when getting up 7. SIZE: For cuts,  bruises, or swelling, ask: How large is it? (e.g., inches or centimeters)      Scratches on knees, cut over left eye  9. OTHER SYMPTOMS: Do you have any other symptoms? (e.g., dizziness, fever, weakness; new-onset or worsening).      Dizziness(chronic), no new or worsening symptoms, no headache 10. CAUSE: What do you think caused the fall (or falling)? (e.g., dizzy spell, tripped)       Walking down steps, unsure if dizzy spell or missed step  Protocols used: Falls [facility]

## 2024-03-03 ENCOUNTER — Other Ambulatory Visit: Payer: Self-pay | Admitting: Family Medicine

## 2024-03-03 DIAGNOSIS — E113513 Type 2 diabetes mellitus with proliferative diabetic retinopathy with macular edema, bilateral: Secondary | ICD-10-CM

## 2024-03-03 DIAGNOSIS — I10 Essential (primary) hypertension: Secondary | ICD-10-CM

## 2024-03-03 DIAGNOSIS — K219 Gastro-esophageal reflux disease without esophagitis: Secondary | ICD-10-CM

## 2024-03-04 ENCOUNTER — Other Ambulatory Visit: Payer: Self-pay

## 2024-03-04 NOTE — Telephone Encounter (Signed)
 Looks like patient has follow up for this week.

## 2024-03-05 NOTE — Telephone Encounter (Signed)
 Patient has no showed last 3 appointments. Ok to refill as requested?

## 2024-03-06 ENCOUNTER — Other Ambulatory Visit (HOSPITAL_COMMUNITY): Payer: Self-pay

## 2024-03-06 MED ORDER — OMEPRAZOLE 40 MG PO CPDR
40.0000 mg | DELAYED_RELEASE_CAPSULE | Freq: Two times a day (BID) | ORAL | 0 refills | Status: DC
Start: 2024-03-06 — End: 2024-03-09
  Filled 2024-03-06: qty 60, 30d supply, fill #0

## 2024-03-06 MED ORDER — LOSARTAN POTASSIUM 25 MG PO TABS
25.0000 mg | ORAL_TABLET | Freq: Every day | ORAL | 0 refills | Status: DC
Start: 2024-03-06 — End: 2024-03-09
  Filled 2024-03-06: qty 30, 30d supply, fill #0

## 2024-03-06 MED ORDER — SPIRONOLACTONE 25 MG PO TABS
12.5000 mg | ORAL_TABLET | Freq: Every day | ORAL | 0 refills | Status: DC
Start: 2024-03-06 — End: 2024-03-09
  Filled 2024-03-06: qty 15, 30d supply, fill #0

## 2024-03-06 MED ORDER — METFORMIN HCL ER 750 MG PO TB24
750.0000 mg | ORAL_TABLET | Freq: Two times a day (BID) | ORAL | 0 refills | Status: DC
  Filled 2024-03-06: qty 60, 30d supply, fill #0

## 2024-03-06 NOTE — Telephone Encounter (Signed)
 ERx

## 2024-03-07 ENCOUNTER — Other Ambulatory Visit: Payer: Self-pay

## 2024-03-08 ENCOUNTER — Ambulatory Visit: Admitting: Family Medicine

## 2024-03-08 ENCOUNTER — Encounter: Payer: Self-pay | Admitting: Family Medicine

## 2024-03-08 VITALS — BP 114/68 | HR 74 | Temp 98.1°F | Ht 60.05 in | Wt 173.4 lb

## 2024-03-08 DIAGNOSIS — Z0000 Encounter for general adult medical examination without abnormal findings: Secondary | ICD-10-CM | POA: Diagnosis not present

## 2024-03-08 DIAGNOSIS — Z7189 Other specified counseling: Secondary | ICD-10-CM

## 2024-03-08 DIAGNOSIS — E1129 Type 2 diabetes mellitus with other diabetic kidney complication: Secondary | ICD-10-CM | POA: Diagnosis not present

## 2024-03-08 DIAGNOSIS — I771 Stricture of artery: Secondary | ICD-10-CM

## 2024-03-08 DIAGNOSIS — K219 Gastro-esophageal reflux disease without esophagitis: Secondary | ICD-10-CM

## 2024-03-08 DIAGNOSIS — D696 Thrombocytopenia, unspecified: Secondary | ICD-10-CM

## 2024-03-08 DIAGNOSIS — R296 Repeated falls: Secondary | ICD-10-CM

## 2024-03-08 DIAGNOSIS — E785 Hyperlipidemia, unspecified: Secondary | ICD-10-CM

## 2024-03-08 DIAGNOSIS — F331 Major depressive disorder, recurrent, moderate: Secondary | ICD-10-CM

## 2024-03-08 DIAGNOSIS — Z8673 Personal history of transient ischemic attack (TIA), and cerebral infarction without residual deficits: Secondary | ICD-10-CM | POA: Diagnosis not present

## 2024-03-08 DIAGNOSIS — E1169 Type 2 diabetes mellitus with other specified complication: Secondary | ICD-10-CM | POA: Diagnosis not present

## 2024-03-08 DIAGNOSIS — I2510 Atherosclerotic heart disease of native coronary artery without angina pectoris: Secondary | ICD-10-CM

## 2024-03-08 DIAGNOSIS — K7469 Other cirrhosis of liver: Secondary | ICD-10-CM | POA: Diagnosis not present

## 2024-03-08 DIAGNOSIS — I10 Essential (primary) hypertension: Secondary | ICD-10-CM

## 2024-03-08 DIAGNOSIS — E2839 Other primary ovarian failure: Secondary | ICD-10-CM

## 2024-03-08 DIAGNOSIS — Z794 Long term (current) use of insulin: Secondary | ICD-10-CM

## 2024-03-08 DIAGNOSIS — R809 Proteinuria, unspecified: Secondary | ICD-10-CM | POA: Diagnosis not present

## 2024-03-08 DIAGNOSIS — B0229 Other postherpetic nervous system involvement: Secondary | ICD-10-CM

## 2024-03-08 DIAGNOSIS — R600 Localized edema: Secondary | ICD-10-CM

## 2024-03-08 DIAGNOSIS — Z0001 Encounter for general adult medical examination with abnormal findings: Secondary | ICD-10-CM

## 2024-03-08 LAB — CBC WITH DIFFERENTIAL/PLATELET
Basophils Absolute: 0 K/uL (ref 0.0–0.1)
Basophils Relative: 0.5 % (ref 0.0–3.0)
Eosinophils Absolute: 0.1 K/uL (ref 0.0–0.7)
Eosinophils Relative: 3.6 % (ref 0.0–5.0)
HCT: 36.4 % (ref 36.0–46.0)
Hemoglobin: 11.9 g/dL — ABNORMAL LOW (ref 12.0–15.0)
Lymphocytes Relative: 33.5 % (ref 12.0–46.0)
Lymphs Abs: 1.1 K/uL (ref 0.7–4.0)
MCHC: 32.8 g/dL (ref 30.0–36.0)
MCV: 89.1 fl (ref 78.0–100.0)
Monocytes Absolute: 0.3 K/uL (ref 0.1–1.0)
Monocytes Relative: 8.6 % (ref 3.0–12.0)
Neutro Abs: 1.7 K/uL (ref 1.4–7.7)
Neutrophils Relative %: 53.8 % (ref 43.0–77.0)
Platelets: 85 K/uL — ABNORMAL LOW (ref 150.0–400.0)
RBC: 4.09 Mil/uL (ref 3.87–5.11)
RDW: 15.7 % — ABNORMAL HIGH (ref 11.5–15.5)
WBC: 3.2 K/uL — ABNORMAL LOW (ref 4.0–10.5)

## 2024-03-08 LAB — LIPID PANEL
Cholesterol: 124 mg/dL (ref 0–200)
HDL: 42.4 mg/dL (ref >39.00)
LDL Cholesterol: 50 mg/dL (ref 0–99)
NonHDL: 81.22
Total CHOL/HDL Ratio: 3
Triglycerides: 155 mg/dL — ABNORMAL HIGH (ref 0.0–149.0)
VLDL: 31 mg/dL (ref 0.0–40.0)

## 2024-03-08 LAB — MICROALBUMIN / CREATININE URINE RATIO
Creatinine,U: 144.4 mg/dL
Microalb Creat Ratio: 47.3 mg/g — ABNORMAL HIGH (ref 0.0–30.0)
Microalb, Ur: 6.8 mg/dL — ABNORMAL HIGH (ref 0.0–1.9)

## 2024-03-08 LAB — COMPREHENSIVE METABOLIC PANEL WITH GFR
ALT: 19 U/L (ref 0–35)
AST: 31 U/L (ref 0–37)
Albumin: 3.8 g/dL (ref 3.5–5.2)
Alkaline Phosphatase: 81 U/L (ref 39–117)
BUN: 12 mg/dL (ref 6–23)
CO2: 27 meq/L (ref 19–32)
Calcium: 8.9 mg/dL (ref 8.4–10.5)
Chloride: 106 meq/L (ref 96–112)
Creatinine, Ser: 0.62 mg/dL (ref 0.40–1.20)
GFR: 92.72 mL/min (ref >60.00)
Glucose, Bld: 196 mg/dL — ABNORMAL HIGH (ref 70–99)
Potassium: 4.4 meq/L (ref 3.5–5.1)
Sodium: 142 meq/L (ref 135–145)
Total Bilirubin: 0.8 mg/dL (ref 0.2–1.2)
Total Protein: 6.9 g/dL (ref 6.0–8.3)

## 2024-03-08 LAB — PROTIME-INR
INR: 1.2 ratio — ABNORMAL HIGH (ref 0.8–1.0)
Prothrombin Time: 12.7 s (ref 9.6–13.1)

## 2024-03-08 LAB — HEMOGLOBIN A1C: Hgb A1c MFr Bld: 7.5 % — ABNORMAL HIGH (ref 4.6–6.5)

## 2024-03-08 LAB — VITAMIN B12: Vitamin B-12: 206 pg/mL — ABNORMAL LOW (ref 211–911)

## 2024-03-08 NOTE — Assessment & Plan Note (Addendum)
 Preventative protocols reviewed and updated unless pt declined. Discussed healthy diet and lifestyle.  Reviewed overdue preventative healthcare - she will continue working towards this.

## 2024-03-08 NOTE — Assessment & Plan Note (Signed)
Update carotid US.  

## 2024-03-08 NOTE — Progress Notes (Signed)
 Ph: [phone] Fax: [phone]   Patient ID: Karen [name], female    DOB: Apr 08, 1956, 68 y.o.   MRN: [id number]  This visit was conducted in person.  BP 114/68   Pulse 74   Temp 98.1 F (36.7 C) (Oral)   Ht 5' 0.05 (1.525 m)   Wt 173 lb 6 oz (78.6 kg)   SpO2 98%   BMI 33.80 kg/m   BP Readings from Last 3 Encounters:  03/08/24 114/68  02/28/24 (!) 129/118  10/30/23 112/64    CC: ER f/u visit / CPE Subjective:   HPI: Karen [name] is a 68 y.o. female presenting on 03/08/2024 for Follow-up (Pt here for ER f/u)   Saw health advisor 10/2023 for medicare wellness visit. Note reviewed.   No results found.  Flowsheet Row Office Visit from 03/08/2024 in [facility]  PHQ-2 Total Score 0       03/08/2024   12:07 PM 10/30/2023   12:24 PM 10/22/2023    8:54 AM 09/04/2023   11:52 AM 06/21/2023   12:17 PM  Fall Risk   Falls in the past year? 1 1 1 1  0  Number falls in past yr: 1 1 1     Injury with Fall? 1 1 1 1    Risk for fall due to : Other (Comment)  Impaired balance/gait History of fall(s)   Risk for fall due to: Comment   pt says she has balance issues and dizziness with vertigo    Follow up Falls evaluation completed  Education provided;Falls prevention discussed Falls evaluation completed   Multiple recent falls - see below.  Multiple recent late cancellations  Seen at ER on 02/28/2024 after fall at home - I lost my balance no loss of consciousness, no seizure activity, no associated chest pain palpitaitons dyspnea or headache. Wednesday fall occurred while at [employer] - may have missed a step and gotten dizzy, landed on sidewalk. ER records reviewed - head and C spine CT reassuringly without acute process. Dx minor concussion. CT showed chronic sphenoid sinusitis - she denies any sinus symptoms.  Notes 3 falls since Wednesday last week.  These episodes have not been associated with hypoglycemia She does note episodes of  positional vertigo but not associated with these falls.   H/o chronic appearing R cerebellar infarct noted on prior head CT 07/2023.  H/o cirrhosis by imaging with last RUQ US  11/2021 showing gallstones.   DM - does regularly check sugars with CGM. Compliant with antihyperglycemic regimen which includes: tresiba  88u daily, novolin  R TID AC + SSI, metformin  xr 750mg  bid, ozempic  1mg  weekly. Denies low sugars. Denies paresthesias, blurry vision. Last diabetic eye exam 2022 - DUE. Glucometer brand: FL3+. Last foot exam: 06/2023. DSME: declined. Lab Results  Component Value Date   HGBA1C 7.5 (H) 03/08/2024   Diabetic Foot Exam - Simple   No data filed    Lab Results  Component Value Date   MICROALBUR 6.8 (H) 03/08/2024    CGM data: FL3+ 30 day average sugar: 182, time in range 50%, hypoglycemia 1%, hyperglycemia 49%  Preventative: COLONOSCOPY WITH ESOPHAGOGASTRODUODENOSCOPY 04/2019 - esophageal varices II, portal hypertensive gastritis, gastric polyp, H pylori neg, 12 TAs, rpt colonoscopy in 1 yr ([facility]) - DUE, never rescheduled.  Well woman exam 01/2019 - h/o abnormal but not recently, neg HPV, told rpt 3 yrs  - DUE, declines today, agrees to return in 3 months for this [facility] - [facility] for this # again provided.  Lung cancer screening - not eligible  Flu shot - yearly COVID vaccine Pfizer 07/2019, 08/2019, booster 04/2020 Pneumovax-23 - 2015, prevnar-20 10/2020 Tdap - 2017 Shingrix - discussed  Advanced directive discussed: does not have. She's unsure who she would designate as HCPOA - possibly brother Lynwood Portugal. Packet previously provided.  Seat belt use discussed Sunscreen use discussed. No changing moles on skin.  Smoking - never Alcohol - none Dentist - due for dental work - trouble finding dentist  Eye exam - yearly [place] - also sees Community Education Officer s/p lens to right eye Bowel - no constipation  Bladder - mild stress incontinence     Caffeine: 2-3 diet sodas/day Lives alone  Occupation: was CNA - now on disability Activity: walking some Diet: some water, fruits/vegetables daily      Relevant past medical, surgical, family and social history reviewed and updated as indicated. Interim medical history since our last visit reviewed. Allergies and medications reviewed and updated. Outpatient Medications Prior to Visit  Medication Sig Dispense Refill   acetaminophen  (TYLENOL ) 500 MG tablet Take 1 tablet (500 mg total) by mouth 2 (two) times daily as needed for moderate pain (pain score 4-6).     aspirin  EC 81 MG EC tablet Take 1 tablet (81 mg total) by mouth daily.     dicyclomine  (BENTYL ) 20 MG tablet Take 1 tablet (20 mg total) by mouth 2 (two) times daily as needed for spasms. 30 tablet 1   Glucagon  (GVOKE HYPOPEN  2-PACK) 1 MG/0.2ML SOAJ Inject 1 mg into the skin daily as needed (hypoglycemia). 0.4 mL 1   Insulin  Pen Needle 31G X 6 MM MISC Use to inject insulin  daily 100 each 3   mupirocin  ointment (BACTROBAN ) 2 % Apply 1 application topically 2 (two) times daily. 22 g 0   nitroGLYCERIN  (NITROSTAT ) 0.4 MG SL tablet Place 1 tablet (0.4 mg total) under the tongue every 5 (five) minutes as needed for chest pain. 25 tablet 0   polyethylene glycol (MIRALAX / GLYCOLAX) 17 g packet Take 17 g by mouth daily as needed for mild constipation.     promethazine  (PHENERGAN ) 25 MG tablet Take 0.5-1 tablets (12.5-25 mg total) by mouth 2 (two) times daily as needed for nausea. 20 tablet 0   rosuvastatin  (CRESTOR ) 10 MG tablet Take 1 tablet (10 mg total) by mouth daily. 90 tablet 3   traMADol  (ULTRAM ) 50 MG tablet Take 1 tablet (50 mg total) by mouth every 8 (eight) hours as needed for moderate pain (pain score 4-6). 20 tablet 0   Continuous Glucose Sensor (FREESTYLE LIBRE 3 PLUS SENSOR) MISC Change sensor every 15 days. 2 each 6   Continuous Glucose Sensor (FREESTYLE LIBRE 3 PLUS SENSOR) MISC Place 1 each onto the skin every 15 days as  directed 2 each 6   furosemide  (LASIX ) 20 MG tablet Take 1 tablet (20 mg total) by mouth daily as needed for edema. 30 tablet 3   gabapentin  (NEURONTIN ) 300 MG capsule Take 1 capsule (300 mg total) by mouth 2 (two) times daily. 60 capsule 3   insulin  degludec (TRESIBA  FLEXTOUCH) 200 UNIT/ML FlexTouch Pen Inject 88 Units into the skin daily. 15 mL 5   insulin  regular (NOVOLIN  R) 100 units/mL injection INJECT 25 UNITS INTO THE SKIN THREE TIMES DAILY BEFORE MEALS PLUS SLIDING SCALE FOR EVERY 50 OVER 200 10 mL 0   Insulin  Regular Human (NOVOLIN  R FLEXPEN RELION) 100 UNIT/ML KwikPen Inject 50  Units as directed 3 (three) times daily before meals. + SSI 5 units for every 50 units over 250 45 mL 5   losartan  (COZAAR ) 25 MG tablet Take 1 tablet (25 mg total) by mouth daily. 30 tablet 0   metFORMIN  (GLUCOPHAGE -XR) 750 MG 24 hr tablet Take 1 tablet (750 mg total) by mouth in the morning and at bedtime. 60 tablet 0   omeprazole  (PRILOSEC) 40 MG capsule Take 1 capsule (40 mg total) by mouth 2 (two) times daily. 60 capsule 0   potassium chloride  (MICRO-K ) 10 MEQ CR capsule Take 1 capsule (10 mEq total) by mouth daily. 30 capsule 3   propranolol  (INDERAL ) 20 MG tablet Take 1 tablet (20 mg total) by mouth 2 (two) times daily. 180 tablet 0   Semaglutide , 1 MG/DOSE, 4 MG/3ML SOPN Inject 1 mg as directed once a week. 3 mL 3   spironolactone  (ALDACTONE ) 25 MG tablet Take 0.5 tablets (12.5 mg total) by mouth daily. 15 tablet 0   venlafaxine  XR (EFFEXOR  XR) 75 MG 24 hr capsule Take 1 capsule (75 mg total) by mouth daily with breakfast. 90 capsule 0   No facility-administered medications prior to visit.     Per HPI unless specifically indicated in ROS section below Review of Systems  Constitutional:  Negative for activity change, appetite change, chills, fatigue, fever and unexpected weight change.  HENT:  Negative for hearing loss.   Eyes:  Negative for visual disturbance.  Respiratory:  Negative for cough, chest  tightness, shortness of breath and wheezing.   Cardiovascular:  Negative for chest pain, palpitations and leg swelling.  Gastrointestinal:  Negative for abdominal distention, abdominal pain, blood in stool, constipation, diarrhea, nausea and vomiting.  Genitourinary:  Negative for difficulty urinating and hematuria.  Musculoskeletal:  Negative for arthralgias, myalgias and neck pain.  Skin:  Negative for rash.  Neurological:  Negative for dizziness, seizures, syncope and headaches.  Hematological:  Negative for adenopathy. Does not bruise/bleed easily.  Psychiatric/Behavioral:  Negative for dysphoric mood. The patient is not nervous/anxious.     Objective:  BP 114/68   Pulse 74   Temp 98.1 F (36.7 C) (Oral)   Ht 5' 0.05 (1.525 m)   Wt 173 lb 6 oz (78.6 kg)   SpO2 98%   BMI 33.80 kg/m   Wt Readings from Last 3 Encounters:  03/08/24 173 lb 6 oz (78.6 kg)  10/30/23 182 lb 4 oz (82.7 kg)  10/26/23 176 lb (79.8 kg)      Physical Exam Vitals and nursing note reviewed.  Constitutional:      Appearance: Normal appearance. She is not ill-appearing.  HENT:     Head: Normocephalic and atraumatic.     Right Ear: Tympanic membrane, ear canal and external ear normal. There is no impacted cerumen.     Left Ear: Tympanic membrane, ear canal and external ear normal. There is no impacted cerumen.     Mouth/Throat:     Mouth: Mucous membranes are moist.     Pharynx: Oropharynx is clear. No oropharyngeal exudate or posterior oropharyngeal erythema.  Eyes:     General:        Right eye: No discharge.        Left eye: No discharge.     Extraocular Movements: Extraocular movements intact.     Conjunctiva/sclera: Conjunctivae normal.     Pupils: Pupils are equal, round, and reactive to light.  Neck:     Thyroid : No thyroid  mass or thyromegaly.  Vascular: Carotid bruit (L>R) present.  Cardiovascular:     Rate and Rhythm: Normal rate and regular rhythm.     Pulses: Normal pulses.      Heart sounds: Normal heart sounds. No murmur heard. Pulmonary:     Effort: Pulmonary effort is normal. No respiratory distress.     Breath sounds: Normal breath sounds. No wheezing, rhonchi or rales.  Abdominal:     General: Bowel sounds are normal. There is no distension.     Palpations: Abdomen is soft. There is no mass.     Tenderness: There is no abdominal tenderness. There is no guarding or rebound.     Hernia: No hernia is present.  Musculoskeletal:     Cervical back: Normal range of motion and neck supple. No rigidity.     Right lower leg: No edema.     Left lower leg: No edema.  Lymphadenopathy:     Cervical: No cervical adenopathy.  Skin:    General: Skin is warm and dry.     Findings: No rash.  Neurological:     General: No focal deficit present.     Mental Status: She is alert. Mental status is at baseline.  Psychiatric:        Mood and Affect: Mood normal.        Behavior: Behavior normal.       Results for orders placed or performed in visit on 03/08/24  Hemoglobin A1c   Collection Time: 03/08/24 12:20 PM  Result Value Ref Range   Hgb A1c MFr Bld 7.5 (H) 4.6 - 6.5 %  Microalbumin / creatinine urine ratio   Collection Time: 03/08/24 12:20 PM  Result Value Ref Range   Microalb, Ur 6.8 (H) 0.0 - 1.9 mg/dL   Creatinine,U 855.5 mg/dL   Microalb Creat Ratio 47.3 (H) 0.0 - 30.0 mg/g  Lipid panel   Collection Time: 03/08/24 12:20 PM  Result Value Ref Range   Cholesterol 124 0 - 200 mg/dL   Triglycerides 844.9 (H) 0.0 - 149.0 mg/dL   HDL 57.59 >60.99 mg/dL   VLDL 68.9 0.0 - 59.9 mg/dL   LDL Cholesterol 50 0 - 99 mg/dL   Total CHOL/HDL Ratio 3    NonHDL 81.22   Comprehensive metabolic panel with GFR   Collection Time: 03/08/24 12:20 PM  Result Value Ref Range   Sodium 142 135 - 145 mEq/L   Potassium 4.4 3.5 - 5.1 mEq/L   Chloride 106 96 - 112 mEq/L   CO2 27 19 - 32 mEq/L   Glucose, Bld 196 (H) 70 - 99 mg/dL   BUN 12 6 - 23 mg/dL   Creatinine, Ser 9.37 0.40  - 1.20 mg/dL   Total Bilirubin 0.8 0.2 - 1.2 mg/dL   Alkaline Phosphatase 81 39 - 117 U/L   AST 31 0 - 37 U/L   ALT 19 0 - 35 U/L   Total Protein 6.9 6.0 - 8.3 g/dL   Albumin 3.8 3.5 - 5.2 g/dL   GFR 07.27 >39.99 mL/min   Calcium  8.9 8.4 - 10.5 mg/dL  Vitamin A87   Collection Time: 03/08/24 12:20 PM  Result Value Ref Range   Vitamin B-12 206 (L) 211 - 911 pg/mL  Protime-INR   Collection Time: 03/08/24 12:20 PM  Result Value Ref Range   INR 1.2 (H) 0.8 - 1.0 ratio   Prothrombin Time 12.7 9.6 - 13.1 sec  CBC with Differential/Platelet   Collection Time: 03/08/24 12:20 PM  Result Value Ref Range  WBC 3.2 (L) 4.0 - 10.5 K/uL   RBC 4.09 3.87 - 5.11 Mil/uL   Hemoglobin 11.9 (L) 12.0 - 15.0 g/dL   HCT 63.5 63.9 - 53.9 %   MCV 89.1 78.0 - 100.0 fl   MCHC 32.8 30.0 - 36.0 g/dL   RDW 84.2 (H) 88.4 - 84.4 %   Platelets 85.0 (L) 150.0 - 400.0 K/uL   Neutrophils Relative % 53.8 43.0 - 77.0 %   Lymphocytes Relative 33.5 12.0 - 46.0 %   Monocytes Relative 8.6 3.0 - 12.0 %   Eosinophils Relative 3.6 0.0 - 5.0 %   Basophils Relative 0.5 0.0 - 3.0 %   Neutro Abs 1.7 1.4 - 7.7 K/uL   Lymphs Abs 1.1 0.7 - 4.0 K/uL   Monocytes Absolute 0.3 0.1 - 1.0 K/uL   Eosinophils Absolute 0.1 0.0 - 0.7 K/uL   Basophils Absolute 0.0 0.0 - 0.1 K/uL   *Note: Due to a large number of results and/or encounters for the requested time period, some results have not been displayed. A complete set of results can be found in Results Review.    Assessment & Plan:   Problem List Items Addressed This Visit     Encounter for general adult medical examination with abnormal findings - Primary (Chronic)   Preventative protocols reviewed and updated unless pt declined. Discussed healthy diet and lifestyle.  Reviewed overdue preventative healthcare - she will continue working towards this.       CAD S/P percutaneous coronary angioplasty (Chronic)   Overdue for cardiology f/u - last seen 12/2022       Relevant  Medications   furosemide  (LASIX ) 20 MG tablet   losartan  (COZAAR ) 25 MG tablet   propranolol  (INDERAL ) 20 MG tablet   Advanced directives, counseling/discussion (Chronic)   Previously discussed - encouraged she complete and bring us  copy       MDD (major depressive disorder), recurrent episode, moderate (HCC)   Chronic, stable on effexor  XR 75mg  daily - continue Now off cymbalta  in h/o cirrhosis.       Relevant Medications   venlafaxine  XR (EFFEXOR  XR) 75 MG 24 hr capsule   Type 2 diabetes mellitus with other specified complication (HCC)   Chronic, update A1c on current regimen. She continues metformin , ozempic , tresiba  and regular insulin .       Relevant Medications   losartan  (COZAAR ) 25 MG tablet   metFORMIN  (GLUCOPHAGE -XR) 750 MG 24 hr tablet   Semaglutide , 1 MG/DOSE, 4 MG/3ML SOPN   insulin  degludec (TRESIBA  FLEXTOUCH) 200 UNIT/ML FlexTouch Pen   Insulin  Regular Human (NOVOLIN  R FLEXPEN RELION) 100 UNIT/ML KwikPen   Other Relevant Orders   Hemoglobin A1c (Completed)   Microalbumin / creatinine urine ratio (Completed)   Vitamin B12 (Completed)   GERD (gastroesophageal reflux disease)   Chronic on PPI BID - refilled.       Relevant Medications   omeprazole  (PRILOSEC) 40 MG capsule   HTN (hypertension)   Chronic, stable period on current regimen - continue.      Relevant Medications   furosemide  (LASIX ) 20 MG tablet   losartan  (COZAAR ) 25 MG tablet   propranolol  (INDERAL ) 20 MG tablet   Dyslipidemia associated with type 2 diabetes mellitus (HCC)   Update FLP on rosuvastatin  10mg  daily. The ASCVD Risk score (Arnett DK, et al., 2019) failed to calculate for the following reasons:   Risk score cannot be calculated because patient has a medical history suggesting prior/existing ASCVD       Relevant  Medications   losartan  (COZAAR ) 25 MG tablet   metFORMIN  (GLUCOPHAGE -XR) 750 MG 24 hr tablet   Semaglutide , 1 MG/DOSE, 4 MG/3ML SOPN   insulin  degludec (TRESIBA   FLEXTOUCH) 200 UNIT/ML FlexTouch Pen   Insulin  Regular Human (NOVOLIN  R FLEXPEN RELION) 100 UNIT/ML KwikPen   Other Relevant Orders   Lipid panel (Completed)   Comprehensive metabolic panel with GFR (Completed)   Type 2 diabetes mellitus with proliferative diabetic retinopathy with macular edema, bilateral (HCC)   Encouraged she schedule diabetic eye exam as overdue      Relevant Medications   Continuous Glucose Sensor (FREESTYLE LIBRE 3 PLUS SENSOR) MISC   losartan  (COZAAR ) 25 MG tablet   metFORMIN  (GLUCOPHAGE -XR) 750 MG 24 hr tablet   Semaglutide , 1 MG/DOSE, 4 MG/3ML SOPN   insulin  degludec (TRESIBA  FLEXTOUCH) 200 UNIT/ML FlexTouch Pen   Insulin  Regular Human (NOVOLIN  R FLEXPEN RELION) 100 UNIT/ML KwikPen   Cirrhosis of liver (HCC)   Overdue for GI f/u - last referred 10/2023. Will provide # to schedule GI f/u for colonoscopy and h/o cirrhosis.       Relevant Orders   Comprehensive metabolic panel with GFR (Completed)   Protime-INR (Completed)   CBC with Differential/Platelet (Completed)   Thrombocytopenia   Thought related to liver disease       Subclavian artery stenosis, right   Update carotid US .       Relevant Medications   furosemide  (LASIX ) 20 MG tablet   losartan  (COZAAR ) 25 MG tablet   propranolol  (INDERAL ) 20 MG tablet   Other Relevant Orders   VAS US  CAROTID   Postherpetic neuralgia   Was using gabapentin  300mg  bid for PHN      Relevant Medications   gabapentin  (NEURONTIN ) 300 MG capsule   History of cerebellar stroke   Continue working towards good sugar, cholesterol and blood pressure control. Continue daily aspirin  and crestor       Type 2 diabetes mellitus with diabetic microalbuminuria, with long-term current use of insulin  (HCC)   Update Umicroalb. She is on low dose ARB.       Relevant Medications   losartan  (COZAAR ) 25 MG tablet   metFORMIN  (GLUCOPHAGE -XR) 750 MG 24 hr tablet   Semaglutide , 1 MG/DOSE, 4 MG/3ML SOPN   insulin  degludec  (TRESIBA  FLEXTOUCH) 200 UNIT/ML FlexTouch Pen   Insulin  Regular Human (NOVOLIN  R FLEXPEN RELION) 100 UNIT/ML KwikPen   Unexplained recurrent falls   Multiple falls with ER evaluations over the past several months associated with unsteadiness - not consistent with syncope or seizure. She doesn't think hypoglycemia contributes. ?orthostasis in setting of weight loss and better BP control - will stop spironolactone  and work towards good hydration status. Update orthostatic vital signs next visit.       Other Visit Diagnoses       Estrogen deficiency       Relevant Orders   DG Bone Density     Pedal edema       Relevant Medications   furosemide  (LASIX ) 20 MG tablet        Meds ordered this encounter  Medications   Continuous Glucose Sensor (FREESTYLE LIBRE 3 PLUS SENSOR) MISC    Sig: Place 1 each onto the skin every 15 days as directed    Dispense:  6 each    Refill:  3   furosemide  (LASIX ) 20 MG tablet    Sig: Take 1 tablet (20 mg total) by mouth daily as needed for edema.    Dispense:  30 tablet  Refill:  3   DISCONTD: gabapentin  (NEURONTIN ) 300 MG capsule    Sig: Take 1 capsule (300 mg total) by mouth 2 (two) times daily.    Dispense:  180 capsule    Refill:  3   losartan  (COZAAR ) 25 MG tablet    Sig: Take 1 tablet (25 mg total) by mouth daily.    Dispense:  90 tablet    Refill:  3   metFORMIN  (GLUCOPHAGE -XR) 750 MG 24 hr tablet    Sig: Take 1 tablet (750 mg total) by mouth in the morning and at bedtime.    Dispense:  180 tablet    Refill:  3   omeprazole  (PRILOSEC) 40 MG capsule    Sig: Take 1 capsule (40 mg total) by mouth 2 (two) times daily.    Dispense:  180 capsule    Refill:  3   potassium chloride  (MICRO-K ) 10 MEQ CR capsule    Sig: Take 1 capsule (10 mEq total) by mouth daily.    Dispense:  30 capsule    Refill:  3   propranolol  (INDERAL ) 20 MG tablet    Sig: Take 1 tablet (20 mg total) by mouth 2 (two) times daily.    Dispense:  180 tablet    Refill:  3    Semaglutide , 1 MG/DOSE, 4 MG/3ML SOPN    Sig: Inject 1 mg as directed once a week.    Dispense:  9 mL    Refill:  3   DISCONTD: spironolactone  (ALDACTONE ) 25 MG tablet    Sig: Take 0.5 tablets (12.5 mg total) by mouth daily.    Dispense:  45 tablet    Refill:  3   venlafaxine  XR (EFFEXOR  XR) 75 MG 24 hr capsule    Sig: Take 1 capsule (75 mg total) by mouth daily with breakfast.    Dispense:  90 capsule    Refill:  3   insulin  degludec (TRESIBA  FLEXTOUCH) 200 UNIT/ML FlexTouch Pen    Sig: Inject 88 Units into the skin daily.    Dispense:  45 mL    Refill:  3   Insulin  Regular Human (NOVOLIN  R FLEXPEN RELION) 100 UNIT/ML KwikPen    Sig: Inject 50 Units as directed 3 (three) times daily before meals. + SSI 5 units for every 50 units over 250    Dispense:  45 mL    Refill:  5   gabapentin  (NEURONTIN ) 300 MG capsule    Sig: Take 1 capsule (300 mg total) by mouth at bedtime.    Dispense:  90 capsule    Refill:  3    Note new dose   DISCONTD: spironolactone  (ALDACTONE ) 25 MG tablet    Sig: Take 0.5 tablets (12.5 mg total) by mouth daily.    Dispense:  1 tablet    Refill:  0    Stopping spironolactone     Orders Placed This Encounter  Procedures   DG Bone Density    Standing Status:   Future    Expiration Date:   03/08/2025    Reason for Exam (SYMPTOM  OR DIAGNOSIS REQUIRED):   estrogen def - op screening    Preferred imaging location?:   [address]   Hemoglobin A1c   Microalbumin / creatinine urine ratio   Lipid panel   Comprehensive metabolic panel with GFR   Vitamin B12   Protime-INR   CBC with Differential/Platelet    Patient Instructions  Flu shot today Labs today  If interested, check with pharmacy about  new 2 shot shingles series (shingrix).  You are due for preventative healthcare including mammogram and colonoscopy and pap smear as well as diabetic eye exam.  Call to schedule mammogram at your convenience: [facility] [phone] Call  to to schedule bone density scan: [facility] [phone] I will order carotid ultrasound to follow up on known subclavian stenosis on right  Schedule pap smear in 3 months.   Follow up plan: Return in about 3 months (around 06/08/2024) for follow up visit.  Anton Blas, MD

## 2024-03-08 NOTE — Patient Instructions (Addendum)
 Flu shot today Labs today  If interested, check with pharmacy about new 2 shot shingles series (shingrix).  You are due for preventative healthcare including mammogram and colonoscopy and pap smear as well as diabetic eye exam.  Call to schedule mammogram at your convenience: [facility] [phone] Call to to schedule bone density scan: [facility] [phone] I will order carotid ultrasound to follow up on known subclavian stenosis on right  Schedule pap smear in 3 months.

## 2024-03-09 ENCOUNTER — Other Ambulatory Visit (HOSPITAL_COMMUNITY): Payer: Self-pay

## 2024-03-09 DIAGNOSIS — Z794 Long term (current) use of insulin: Secondary | ICD-10-CM | POA: Insufficient documentation

## 2024-03-09 DIAGNOSIS — R296 Repeated falls: Secondary | ICD-10-CM | POA: Insufficient documentation

## 2024-03-09 MED ORDER — METFORMIN HCL ER 750 MG PO TB24
750.0000 mg | ORAL_TABLET | Freq: Two times a day (BID) | ORAL | 3 refills | Status: AC
  Filled 2024-03-09 – 2024-04-02 (×2): qty 180, 90d supply, fill #0

## 2024-03-09 MED ORDER — GABAPENTIN 300 MG PO CAPS
300.0000 mg | ORAL_CAPSULE | Freq: Two times a day (BID) | ORAL | 3 refills | Status: DC
  Filled 2024-03-09: qty 180, 90d supply, fill #0

## 2024-03-09 MED ORDER — POTASSIUM CHLORIDE ER 10 MEQ PO CPCR
10.0000 meq | ORAL_CAPSULE | Freq: Every day | ORAL | 3 refills | Status: AC
  Filled 2024-03-09: qty 30, 30d supply, fill #0

## 2024-03-09 MED ORDER — TRESIBA FLEXTOUCH 200 UNIT/ML ~~LOC~~ SOPN
88.0000 [IU] | PEN_INJECTOR | Freq: Every day | SUBCUTANEOUS | 3 refills | Status: AC
  Filled 2024-03-09 – 2024-03-29 (×2): qty 15, 34d supply, fill #0
  Filled 2024-04-28: qty 15, 34d supply, fill #1
  Filled 2024-06-01: qty 15, 34d supply, fill #2

## 2024-03-09 MED ORDER — FUROSEMIDE 20 MG PO TABS
20.0000 mg | ORAL_TABLET | Freq: Every day | ORAL | 3 refills | Status: AC | PRN
  Filled 2024-03-09: qty 30, 30d supply, fill #0

## 2024-03-09 MED ORDER — SPIRONOLACTONE 25 MG PO TABS
12.5000 mg | ORAL_TABLET | Freq: Every day | ORAL | 3 refills | Status: DC
  Filled 2024-03-09: qty 45, 90d supply, fill #0

## 2024-03-09 MED ORDER — GABAPENTIN 300 MG PO CAPS
300.0000 mg | ORAL_CAPSULE | Freq: Every day | ORAL | 3 refills | Status: AC
  Filled 2024-03-09 – 2024-03-12 (×4): qty 90, 90d supply, fill #0

## 2024-03-09 MED ORDER — PROPRANOLOL HCL 20 MG PO TABS
20.0000 mg | ORAL_TABLET | Freq: Two times a day (BID) | ORAL | 3 refills | Status: AC
  Filled 2024-03-09 – 2024-04-01 (×2): qty 180, 90d supply, fill #0

## 2024-03-09 MED ORDER — NOVOLIN R FLEXPEN 100 UNIT/ML IJ SOPN
50.0000 [IU] | PEN_INJECTOR | Freq: Three times a day (TID) | INTRAMUSCULAR | 5 refills | Status: AC
  Filled 2024-03-09: qty 45, 30d supply, fill #0

## 2024-03-09 MED ORDER — LOSARTAN POTASSIUM 25 MG PO TABS
25.0000 mg | ORAL_TABLET | Freq: Every day | ORAL | 3 refills | Status: AC
  Filled 2024-03-09 – 2024-04-02 (×2): qty 90, 90d supply, fill #0

## 2024-03-09 MED ORDER — FREESTYLE LIBRE 3 PLUS SENSOR MISC
1.0000 | 3 refills | Status: AC
  Filled 2024-03-09: qty 6, 90d supply, fill #0
  Filled 2024-06-14: qty 6, 90d supply, fill #1

## 2024-03-09 MED ORDER — SPIRONOLACTONE 25 MG PO TABS
12.5000 mg | ORAL_TABLET | Freq: Every day | ORAL | 0 refills | Status: DC
  Filled 2024-03-09: qty 1, 2d supply, fill #0

## 2024-03-09 MED ORDER — SEMAGLUTIDE (1 MG/DOSE) 4 MG/3ML ~~LOC~~ SOPN
1.0000 mg | PEN_INJECTOR | SUBCUTANEOUS | 3 refills | Status: AC
  Filled 2024-03-09: qty 9, 84d supply, fill #0

## 2024-03-09 MED ORDER — VENLAFAXINE HCL ER 75 MG PO CP24
75.0000 mg | ORAL_CAPSULE | Freq: Every day | ORAL | 3 refills | Status: AC
  Filled 2024-03-09 – 2024-04-05 (×2): qty 90, 90d supply, fill #0

## 2024-03-09 MED ORDER — OMEPRAZOLE 40 MG PO CPDR
40.0000 mg | DELAYED_RELEASE_CAPSULE | Freq: Two times a day (BID) | ORAL | 3 refills | Status: AC
  Filled 2024-03-09 – 2024-04-02 (×2): qty 180, 90d supply, fill #0

## 2024-03-09 NOTE — Assessment & Plan Note (Signed)
 Overdue for GI f/u - last referred 10/2023. Will provide # to schedule GI f/u for colonoscopy and h/o cirrhosis.

## 2024-03-09 NOTE — Assessment & Plan Note (Signed)
 Continue working towards good sugar, cholesterol and blood pressure control. Continue daily aspirin  and crestor 

## 2024-03-09 NOTE — Assessment & Plan Note (Signed)
 Chronic on PPI BID - refilled.

## 2024-03-09 NOTE — Assessment & Plan Note (Signed)
 Chronic, stable period on current regimen - continue

## 2024-03-09 NOTE — Assessment & Plan Note (Signed)
Thought related to liver disease.  ?

## 2024-03-09 NOTE — Assessment & Plan Note (Addendum)
 Chronic, stable on effexor  XR 75mg  daily - continue Now off cymbalta  in h/o cirrhosis.

## 2024-03-09 NOTE — Assessment & Plan Note (Signed)
 Encouraged she schedule diabetic eye exam as overdue

## 2024-03-09 NOTE — Assessment & Plan Note (Addendum)
 Update Umicroalb. She is on low dose ARB.

## 2024-03-09 NOTE — Assessment & Plan Note (Signed)
 Was using gabapentin  300mg  bid for PHN

## 2024-03-09 NOTE — Assessment & Plan Note (Signed)
 Chronic, update A1c on current regimen. She continues metformin , ozempic , tresiba  and regular insulin .

## 2024-03-09 NOTE — Assessment & Plan Note (Signed)
 Previously discussed - encouraged she complete and bring us  copy

## 2024-03-09 NOTE — Assessment & Plan Note (Addendum)
 Multiple falls with ER evaluations over the past several months associated with unsteadiness - not consistent with syncope or seizure. She doesn't think hypoglycemia contributes. ?orthostasis in setting of weight loss and better BP control - will stop spironolactone  and work towards good hydration status. Update orthostatic vital signs next visit.

## 2024-03-09 NOTE — Assessment & Plan Note (Signed)
 Update FLP on rosuvastatin  10mg  daily. The ASCVD Risk score (Arnett DK, et al., 2019) failed to calculate for the following reasons:   Risk score cannot be calculated because patient has a medical history suggesting prior/existing ASCVD

## 2024-03-09 NOTE — Assessment & Plan Note (Signed)
 Overdue for cardiology f/u - last seen 12/2022

## 2024-03-10 ENCOUNTER — Other Ambulatory Visit (HOSPITAL_COMMUNITY): Payer: Self-pay

## 2024-03-11 ENCOUNTER — Other Ambulatory Visit: Payer: Self-pay

## 2024-03-11 ENCOUNTER — Other Ambulatory Visit (HOSPITAL_COMMUNITY): Payer: Self-pay

## 2024-03-12 ENCOUNTER — Other Ambulatory Visit (HOSPITAL_COMMUNITY): Payer: Self-pay

## 2024-03-14 ENCOUNTER — Ambulatory Visit: Payer: Self-pay | Admitting: Family Medicine

## 2024-03-14 DIAGNOSIS — I6523 Occlusion and stenosis of bilateral carotid arteries: Secondary | ICD-10-CM

## 2024-03-14 DIAGNOSIS — I771 Stricture of artery: Secondary | ICD-10-CM

## 2024-03-14 DIAGNOSIS — E538 Deficiency of other specified B group vitamins: Secondary | ICD-10-CM | POA: Insufficient documentation

## 2024-03-14 MED ORDER — VITAMIN B-12 1000 MCG PO TABS: 1000.0000 ug | ORAL_TABLET | Freq: Every day | ORAL | Status: AC

## 2024-03-20 ENCOUNTER — Ambulatory Visit (HOSPITAL_COMMUNITY): Admission: RE | Admit: 2024-03-20 | Source: Ambulatory Visit

## 2024-03-29 ENCOUNTER — Other Ambulatory Visit (HOSPITAL_COMMUNITY): Payer: Self-pay

## 2024-03-29 ENCOUNTER — Other Ambulatory Visit: Payer: Self-pay

## 2024-04-01 ENCOUNTER — Other Ambulatory Visit (HOSPITAL_COMMUNITY): Payer: Self-pay

## 2024-04-01 ENCOUNTER — Other Ambulatory Visit: Payer: Self-pay

## 2024-04-02 ENCOUNTER — Other Ambulatory Visit: Payer: Self-pay

## 2024-04-02 ENCOUNTER — Other Ambulatory Visit (HOSPITAL_COMMUNITY): Payer: Self-pay

## 2024-04-02 ENCOUNTER — Ambulatory Visit (HOSPITAL_COMMUNITY)
Admission: RE | Admit: 2024-04-02 | Discharge: 2024-04-02 | Disposition: A | Discharge status: Home / Self Care | Source: Ambulatory Visit | Attending: Family Medicine | Admitting: Family Medicine

## 2024-04-02 DIAGNOSIS — R0989 Other specified symptoms and signs involving the circulatory and respiratory systems: Secondary | ICD-10-CM | POA: Insufficient documentation

## 2024-04-02 DIAGNOSIS — I771 Stricture of artery: Secondary | ICD-10-CM | POA: Insufficient documentation

## 2024-04-05 ENCOUNTER — Other Ambulatory Visit: Payer: Self-pay

## 2024-04-05 ENCOUNTER — Other Ambulatory Visit (HOSPITAL_BASED_OUTPATIENT_CLINIC_OR_DEPARTMENT_OTHER): Payer: Self-pay

## 2024-04-08 DIAGNOSIS — I6523 Occlusion and stenosis of bilateral carotid arteries: Secondary | ICD-10-CM | POA: Insufficient documentation

## 2024-04-24 ENCOUNTER — Ambulatory Visit

## 2024-05-02 ENCOUNTER — Other Ambulatory Visit: Payer: Self-pay

## 2024-05-14 ENCOUNTER — Ambulatory Visit: Admitting: Podiatry

## 2024-05-15 ENCOUNTER — Inpatient Hospital Stay (HOSPITAL_BASED_OUTPATIENT_CLINIC_OR_DEPARTMENT_OTHER): Admission: RE | Admit: 2024-05-15 | Source: Ambulatory Visit

## 2024-05-20 ENCOUNTER — Ambulatory Visit: Payer: Self-pay

## 2024-05-20 NOTE — Telephone Encounter (Signed)
 FYI Only or Action Required?: Action required by provider: request for appointment.  Patient was last seen in primary care on 03/08/2024 by Rilla Baller, MD.  Called Nurse Triage reporting Cough.  Symptoms began several days ago.  Interventions attempted: Rest, hydration, or home remedies.  Symptoms are: gradually worsening. Cough, runny nose with green mucus.  Triage Disposition: See Physician Within 24 Hours  Patient/caregiver understands and will follow disposition?: Yes      Copied from CRM [id number]. Topic: Clinical - Red Word Triage >> May 20, 2024 12:35 PM Karen [name] wrote: Red Word that prompted transfer to Nurse Triage: Patient states she is having a really deep cough, really green colored mucus, and head is severely stopped up Reason for Disposition  [1] Continuous (nonstop) coughing interferes with work or school AND [2] no improvement using cough treatment per Care Advice  Answer Assessment - Initial Assessment Questions 1. ONSET: When did the cough begin?      Friday 2. SEVERITY: How bad is the cough today?      Moderate 3. SPUTUM: Describe the color of your sputum (e.g., none, dry cough; clear, white, yellow, green)     Nose - green 4. HEMOPTYSIS: Are you coughing up any blood? If Yes, ask: How much? (e.g., flecks, streaks, tablespoons, etc.)     no 5. DIFFICULTY BREATHING: Are you having difficulty breathing? If Yes, ask: How bad is it? (e.g., mild, moderate, severe)      no 6. FEVER: Do you have a fever? If Yes, ask: What is your temperature, how was it measured, and when did it start?     no 7. CARDIAC HISTORY: Do you have any history of heart disease? (e.g., heart attack, congestive heart failure)      no 8. LUNG HISTORY: Do you have any history of lung disease?  (e.g., pulmonary embolus, asthma, emphysema)     no 9. PE RISK FACTORS: Do you have a history of blood clots? (or: recent major surgery, recent prolonged travel,  bedridden)     no 10. OTHER SYMPTOMS: Do you have any other symptoms? (e.g., runny nose, wheezing, chest pain)       Runny nose 11. PREGNANCY: Is there any chance you are pregnant? When was your last menstrual period?       no 12. TRAVEL: Have you traveled out of the country in the last month? (e.g., travel history, exposures)       no  Protocols used: Cough - Acute Non-Productive-A-AH

## 2024-05-20 NOTE — Telephone Encounter (Signed)
 Noted. Will see then.

## 2024-05-21 ENCOUNTER — Telehealth: Admitting: Family Medicine

## 2024-05-21 ENCOUNTER — Encounter: Payer: Self-pay | Admitting: Family Medicine

## 2024-05-21 VITALS — Ht 60.25 in | Wt 173.0 lb

## 2024-05-21 DIAGNOSIS — J111 Influenza due to unidentified influenza virus with other respiratory manifestations: Secondary | ICD-10-CM

## 2024-05-21 NOTE — Assessment & Plan Note (Signed)
 Anticipate viral URI with cough vs influenza given that is going around. She is 5 days into illness, and with overall mild symptoms.  Will not prescribe tamiflu. Reviewed supportive measures at home - to check with pharmacy on diabetic-safe cough syrup with dextromethorphan/guaifenesin .  She states tessalon  perls are effective but expensive and not covered by her insurance.  Red flags to seek further care or update us  ie fever >101, worsening productive cough, new dyspnea or other new symptoms.

## 2024-05-21 NOTE — Progress Notes (Signed)
 " Ph: [phone] Fax: [phone]   Patient ID: Karen [name], female    DOB: Jul 02, 1955, 69 y.o.   MRN: [id number]  Virtual visit completed through MyChart, a video enabled telemedicine application. Due to national recommendations of social distancing due to COVID-19, a virtual visit is felt to be most appropriate for this patient at this time. Reviewed limitations, risks, security and privacy concerns of performing a virtual visit and the availability of in person appointments. I also reviewed that there may be a patient responsible charge related to this service. The patient agreed to proceed.   Patient location: home Provider location: [place] at [facility], office Persons participating in this virtual visit: patient, provider  She was unable to get transportation to office today as she doesn't have a car.   If any vitals were documented, they were collected by patient at home unless specified below.    Ht 5' 0.25 (1.53 m)   Wt 173 lb (78.5 kg)   BMI 33.51 kg/m    CC: cough Subjective:   HPI: Karen [name] is a 69 y.o. female presenting on 05/21/2024 for Acute Visit (Onset 05/17/2024/Dry cough, runny nose)   5d h/o dry cough, head congestion, chills, and body aches, PNDrainage. Cough kept her awake all last night.   States she received influenza in our office 03/08/2024.   No fevers, ear or tooth pain, headache, nausea, abd pain, diarrhea, ST, PNdrainage. No dyspnea or wheezing.   No sick contacts.  Treating symptoms with nothing yet.   Known diabetic on tresiba  88u daily, novolin  R TID AC+SSI, metformin  XR 750mg  bid, and ozempic  1mg  weekly.  Lab Results  Component Value Date   HGBA1C 7.5 (H) 03/08/2024        Relevant past medical, surgical, family and social history reviewed and updated as indicated. Interim medical history since our last visit reviewed. Allergies and medications reviewed and updated. Outpatient Medications Prior to Visit  Medication Sig  Dispense Refill   acetaminophen  (TYLENOL ) 500 MG tablet Take 1 tablet (500 mg total) by mouth 2 (two) times daily as needed for moderate pain (pain score 4-6).     aspirin  EC 81 MG EC tablet Take 1 tablet (81 mg total) by mouth daily.     Continuous Glucose Sensor (FREESTYLE LIBRE 3 PLUS SENSOR) MISC Place 1 each onto the skin every 15 days as directed 6 each 3   cyanocobalamin  (VITAMIN B12) 1000 MCG tablet Take 1 tablet (1,000 mcg total) by mouth daily.     dicyclomine  (BENTYL ) 20 MG tablet Take 1 tablet (20 mg total) by mouth 2 (two) times daily as needed for spasms. 30 tablet 1   furosemide  (LASIX ) 20 MG tablet Take 1 tablet (20 mg total) by mouth daily as needed for edema. 30 tablet 3   gabapentin  (NEURONTIN ) 300 MG capsule Take 1 capsule (300 mg total) by mouth at bedtime. 90 capsule 3   Glucagon  (GVOKE HYPOPEN  2-PACK) 1 MG/0.2ML SOAJ Inject 1 mg into the skin daily as needed (hypoglycemia). 0.4 mL 1   insulin  degludec (TRESIBA  FLEXTOUCH) 200 UNIT/ML FlexTouch Pen Inject 88 Units into the skin daily. 45 mL 3   Insulin  Pen Needle 31G X 6 MM MISC Use to inject insulin  daily 100 each 3   Insulin  Regular Human (NOVOLIN  R FLEXPEN RELION) 100 UNIT/ML KwikPen Inject 50 Units as directed 3 (three) times daily before meals. + SSI 5 units for every 50 units over 250 45 mL 5   losartan  (  COZAAR ) 25 MG tablet Take 1 tablet (25 mg total) by mouth daily. 90 tablet 3   metFORMIN  (GLUCOPHAGE -XR) 750 MG 24 hr tablet Take 1 tablet (750 mg total) by mouth in the morning and at bedtime. 180 tablet 3   mupirocin  ointment (BACTROBAN ) 2 % Apply 1 application topically 2 (two) times daily. (Patient taking differently: Apply 1 application  topically as needed.) 22 g 0   nitroGLYCERIN  (NITROSTAT ) 0.4 MG SL tablet Place 1 tablet (0.4 mg total) under the tongue every 5 (five) minutes as needed for chest pain. 25 tablet 0   omeprazole  (PRILOSEC) 40 MG capsule Take 1 capsule (40 mg total) by mouth 2 (two) times daily. 180  capsule 3   polyethylene glycol (MIRALAX / GLYCOLAX) 17 g packet Take 17 g by mouth daily as needed for mild constipation.     potassium chloride  (MICRO-K ) 10 MEQ CR capsule Take 1 capsule (10 mEq total) by mouth daily. 30 capsule 3   promethazine  (PHENERGAN ) 25 MG tablet Take 0.5-1 tablets (12.5-25 mg total) by mouth 2 (two) times daily as needed for nausea. 20 tablet 0   propranolol  (INDERAL ) 20 MG tablet Take 1 tablet (20 mg total) by mouth 2 (two) times daily. 180 tablet 3   rosuvastatin  (CRESTOR ) 10 MG tablet Take 1 tablet (10 mg total) by mouth daily. 90 tablet 3   Semaglutide , 1 MG/DOSE, 4 MG/3ML SOPN Inject 1 mg as directed once a week. 9 mL 3   traMADol  (ULTRAM ) 50 MG tablet Take 1 tablet (50 mg total) by mouth every 8 (eight) hours as needed for moderate pain (pain score 4-6). 20 tablet 0   venlafaxine  XR (EFFEXOR  XR) 75 MG 24 hr capsule Take 1 capsule (75 mg total) by mouth daily with breakfast. 90 capsule 3   No facility-administered medications prior to visit.     Per HPI unless specifically indicated in ROS section below Review of Systems Objective:  Ht 5' 0.25 (1.53 m)   Wt 173 lb (78.5 kg)   BMI 33.51 kg/m   Wt Readings from Last 3 Encounters:  05/21/24 173 lb (78.5 kg)  03/08/24 173 lb 6 oz (78.6 kg)  10/30/23 182 lb 4 oz (82.7 kg)       Physical exam: Gen: alert, NAD, not ill appearing Pulm: speaks in complete sentences without increased work of breathing Psych: normal mood, normal thought content      Assessment & Plan:   Influenza-like illness Assessment & Plan: Anticipate viral URI with cough vs influenza given that is going around. She is 5 days into illness, and with overall mild symptoms.  Will not prescribe tamiflu. Reviewed supportive measures at home - to check with pharmacy on diabetic-safe cough syrup with dextromethorphan/guaifenesin .  She states tessalon  perls are effective but expensive and not covered by her insurance.  Red flags to seek  further care or update us  ie fever >101, worsening productive cough, new dyspnea or other new symptoms.       I discussed the assessment and treatment plan with the patient. The patient was provided an opportunity to ask questions and all were answered. The patient agreed with the plan and demonstrated an understanding of the instructions. The patient was advised to call back or seek an in-person evaluation if the symptoms worsen or if the condition fails to improve as anticipated.  Follow up plan: No follow-ups on file.  Anton Blas, MD   "

## 2024-05-24 ENCOUNTER — Ambulatory Visit

## 2024-06-03 ENCOUNTER — Other Ambulatory Visit: Payer: Self-pay

## 2024-06-10 ENCOUNTER — Ambulatory Visit: Admitting: Family Medicine

## 2024-06-18 ENCOUNTER — Ambulatory Visit

## 2024-06-19 ENCOUNTER — Ambulatory Visit: Admitting: Family Medicine

## 2024-07-04 ENCOUNTER — Ambulatory Visit: Admitting: Podiatry

## 2024-07-08 ENCOUNTER — Ambulatory Visit: Admitting: Family Medicine

## 2024-10-29 ENCOUNTER — Ambulatory Visit
